# Patient Record
Sex: Female | Born: 1980 | Race: White | Hispanic: No | Marital: Married | Smoking: Never smoker
Health system: Southern US, Community
[De-identification: ages and names within clinical notes are randomized; demographics above are authoritative.]

## PROBLEM LIST (undated history)

## (undated) ENCOUNTER — Inpatient Hospital Stay (HOSPITAL_COMMUNITY): Payer: Self-pay

## (undated) DIAGNOSIS — N390 Urinary tract infection, site not specified: Secondary | ICD-10-CM

## (undated) DIAGNOSIS — F32A Depression, unspecified: Secondary | ICD-10-CM

## (undated) DIAGNOSIS — E7800 Pure hypercholesterolemia, unspecified: Secondary | ICD-10-CM

## (undated) DIAGNOSIS — Z8719 Personal history of other diseases of the digestive system: Secondary | ICD-10-CM

## (undated) DIAGNOSIS — J341 Cyst and mucocele of nose and nasal sinus: Secondary | ICD-10-CM

## (undated) DIAGNOSIS — Z8711 Personal history of peptic ulcer disease: Secondary | ICD-10-CM

## (undated) DIAGNOSIS — G43909 Migraine, unspecified, not intractable, without status migrainosus: Secondary | ICD-10-CM

## (undated) DIAGNOSIS — M1990 Unspecified osteoarthritis, unspecified site: Secondary | ICD-10-CM

## (undated) DIAGNOSIS — I10 Essential (primary) hypertension: Secondary | ICD-10-CM

## (undated) DIAGNOSIS — Z789 Other specified health status: Secondary | ICD-10-CM

## (undated) DIAGNOSIS — F419 Anxiety disorder, unspecified: Secondary | ICD-10-CM

## (undated) DIAGNOSIS — F329 Major depressive disorder, single episode, unspecified: Secondary | ICD-10-CM

## (undated) DIAGNOSIS — Z780 Asymptomatic menopausal state: Secondary | ICD-10-CM

## (undated) DIAGNOSIS — M2550 Pain in unspecified joint: Secondary | ICD-10-CM

## (undated) DIAGNOSIS — F988 Other specified behavioral and emotional disorders with onset usually occurring in childhood and adolescence: Secondary | ICD-10-CM

## (undated) DIAGNOSIS — N83209 Unspecified ovarian cyst, unspecified side: Secondary | ICD-10-CM

## (undated) DIAGNOSIS — M549 Dorsalgia, unspecified: Secondary | ICD-10-CM

## (undated) DIAGNOSIS — K5900 Constipation, unspecified: Secondary | ICD-10-CM

## (undated) DIAGNOSIS — R12 Heartburn: Secondary | ICD-10-CM

## (undated) DIAGNOSIS — E042 Nontoxic multinodular goiter: Secondary | ICD-10-CM

## (undated) DIAGNOSIS — R1310 Dysphagia, unspecified: Secondary | ICD-10-CM

## (undated) DIAGNOSIS — M25519 Pain in unspecified shoulder: Secondary | ICD-10-CM

## (undated) HISTORY — DX: Personal history of peptic ulcer disease: Z87.11

## (undated) HISTORY — DX: Heartburn: R12

## (undated) HISTORY — DX: Anxiety disorder, unspecified: F41.9

## (undated) HISTORY — DX: Other specified behavioral and emotional disorders with onset usually occurring in childhood and adolescence: F98.8

## (undated) HISTORY — DX: Migraine, unspecified, not intractable, without status migrainosus: G43.909

## (undated) HISTORY — DX: Dysphagia, unspecified: R13.10

## (undated) HISTORY — DX: Cyst and mucocele of nose and nasal sinus: J34.1

## (undated) HISTORY — DX: Depression, unspecified: F32.A

## (undated) HISTORY — PX: WISDOM TOOTH EXTRACTION: SHX21

## (undated) HISTORY — DX: Pain in unspecified joint: M25.50

## (undated) HISTORY — DX: Asymptomatic menopausal state: Z78.0

## (undated) HISTORY — DX: Nontoxic multinodular goiter: E04.2

## (undated) HISTORY — PX: TUBAL LIGATION: SHX77

## (undated) HISTORY — DX: Pain in unspecified shoulder: M25.519

## (undated) HISTORY — DX: Pure hypercholesterolemia, unspecified: E78.00

## (undated) HISTORY — PX: SHOULDER CAPSULORRHAPHY: SUR188

## (undated) HISTORY — DX: Constipation, unspecified: K59.00

## (undated) HISTORY — DX: Dorsalgia, unspecified: M54.9

## (undated) HISTORY — DX: Major depressive disorder, single episode, unspecified: F32.9

## (undated) HISTORY — PX: OTHER SURGICAL HISTORY: SHX169

---

## 2011-01-28 ENCOUNTER — Other Ambulatory Visit: Payer: Self-pay | Admitting: Chiropractic Medicine

## 2011-01-28 ENCOUNTER — Ambulatory Visit
Admission: RE | Admit: 2011-01-28 | Discharge: 2011-01-28 | Disposition: A | Discharge status: Home / Self Care | Payer: No Typology Code available for payment source | Source: Ambulatory Visit | Attending: Chiropractic Medicine | Admitting: Chiropractic Medicine

## 2011-01-28 DIAGNOSIS — M25571 Pain in right ankle and joints of right foot: Secondary | ICD-10-CM

## 2011-01-28 IMAGING — CR DG ANKLE COMPLETE 3+V*R*
3 series · 3 of 3 positions shown · non-contrast
Comparison: None

CLINICAL DATA: 1 week status post fall.  Inversion injury.
Question fracture.  Lateral pain.

RIGHT ANKLE - COMPLETE 3+ VIEW

[t ankle joint ap right]
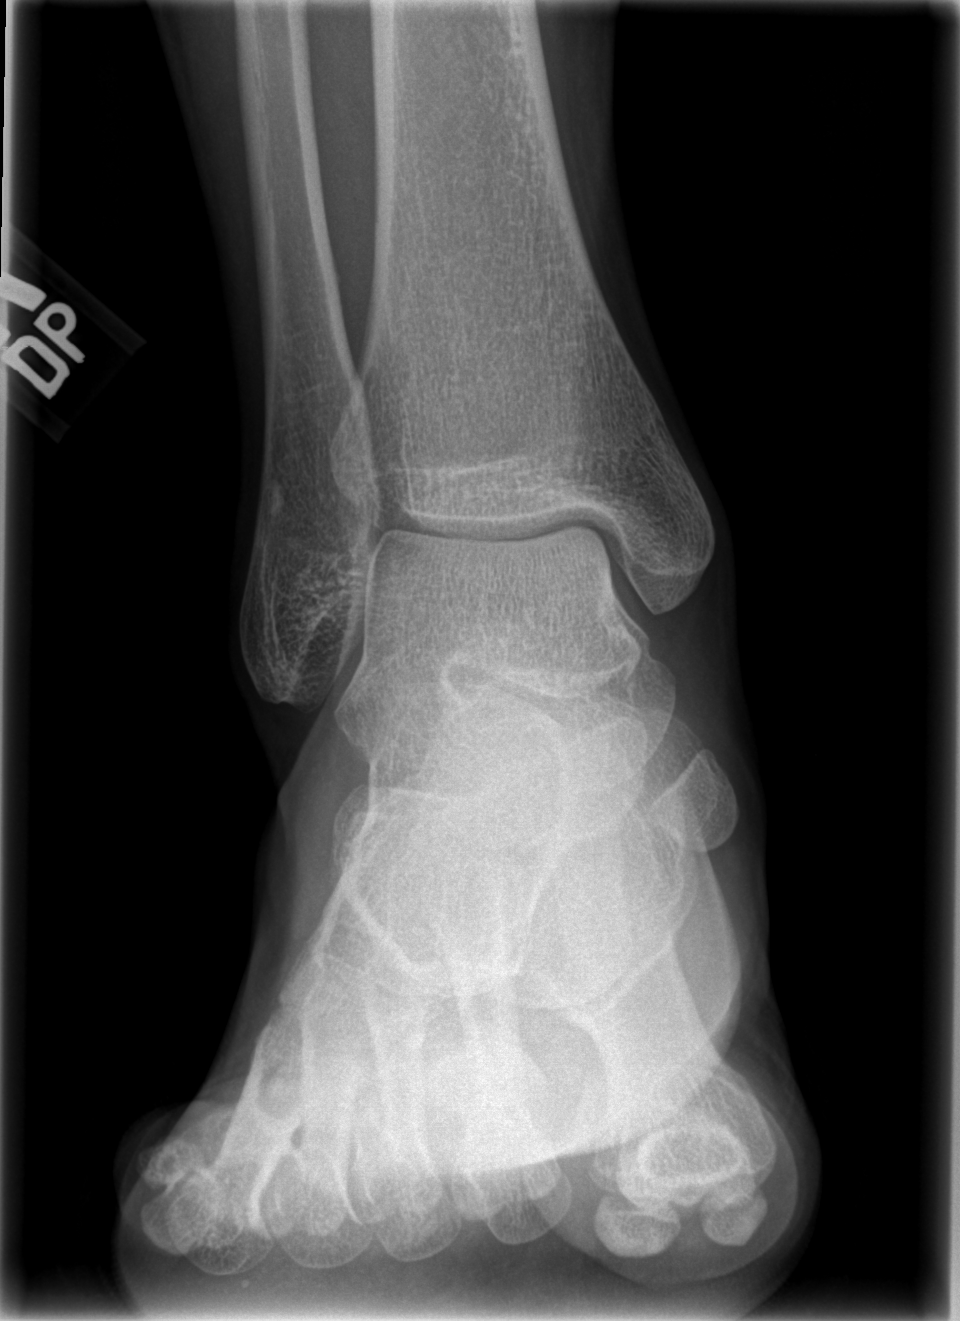

[t ankle joint oblique right]
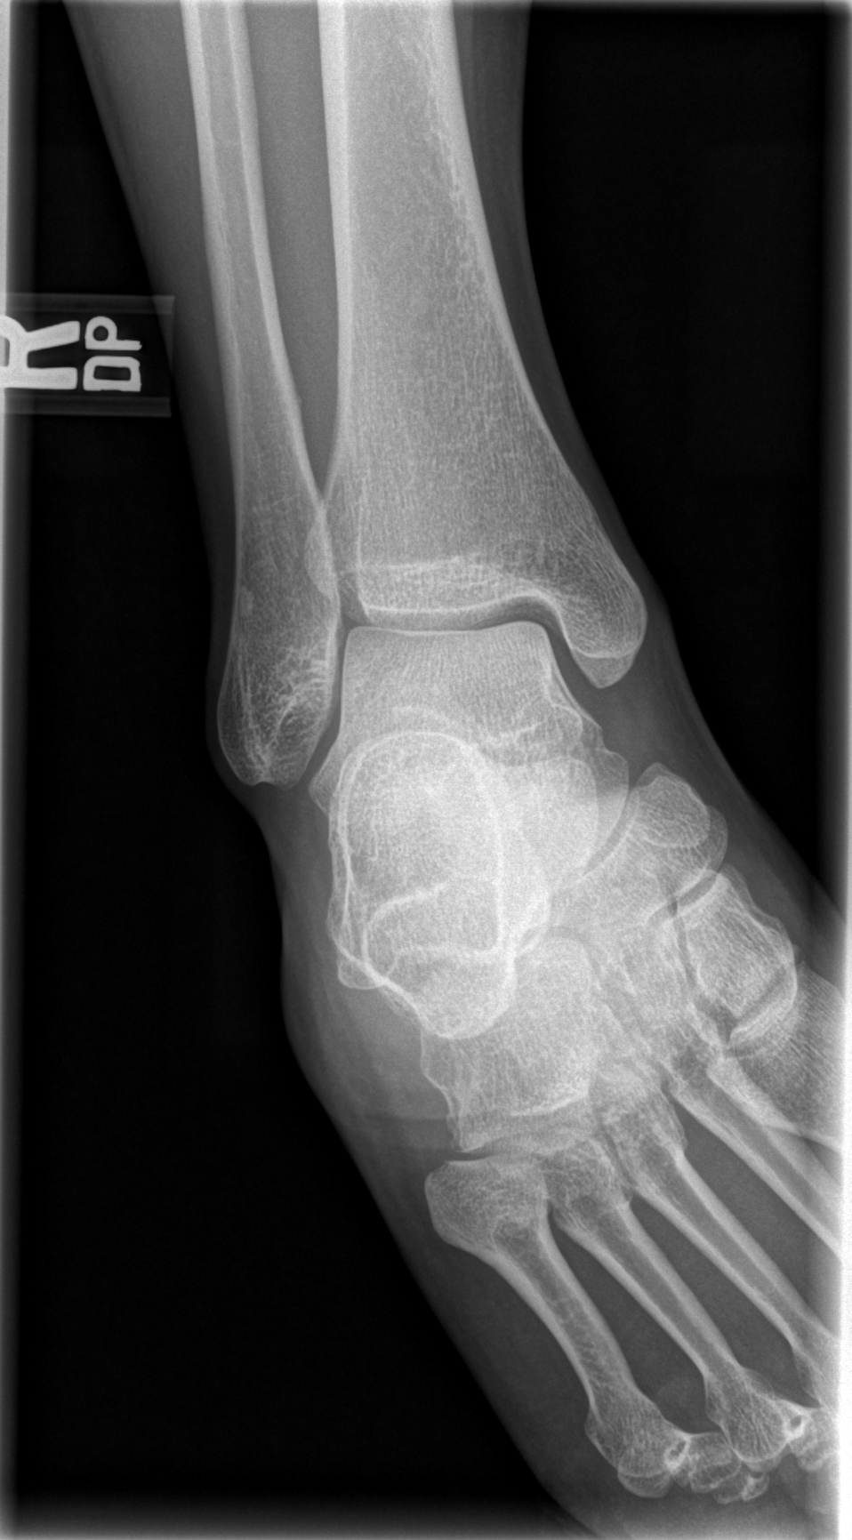

[t ankle joint lat right]
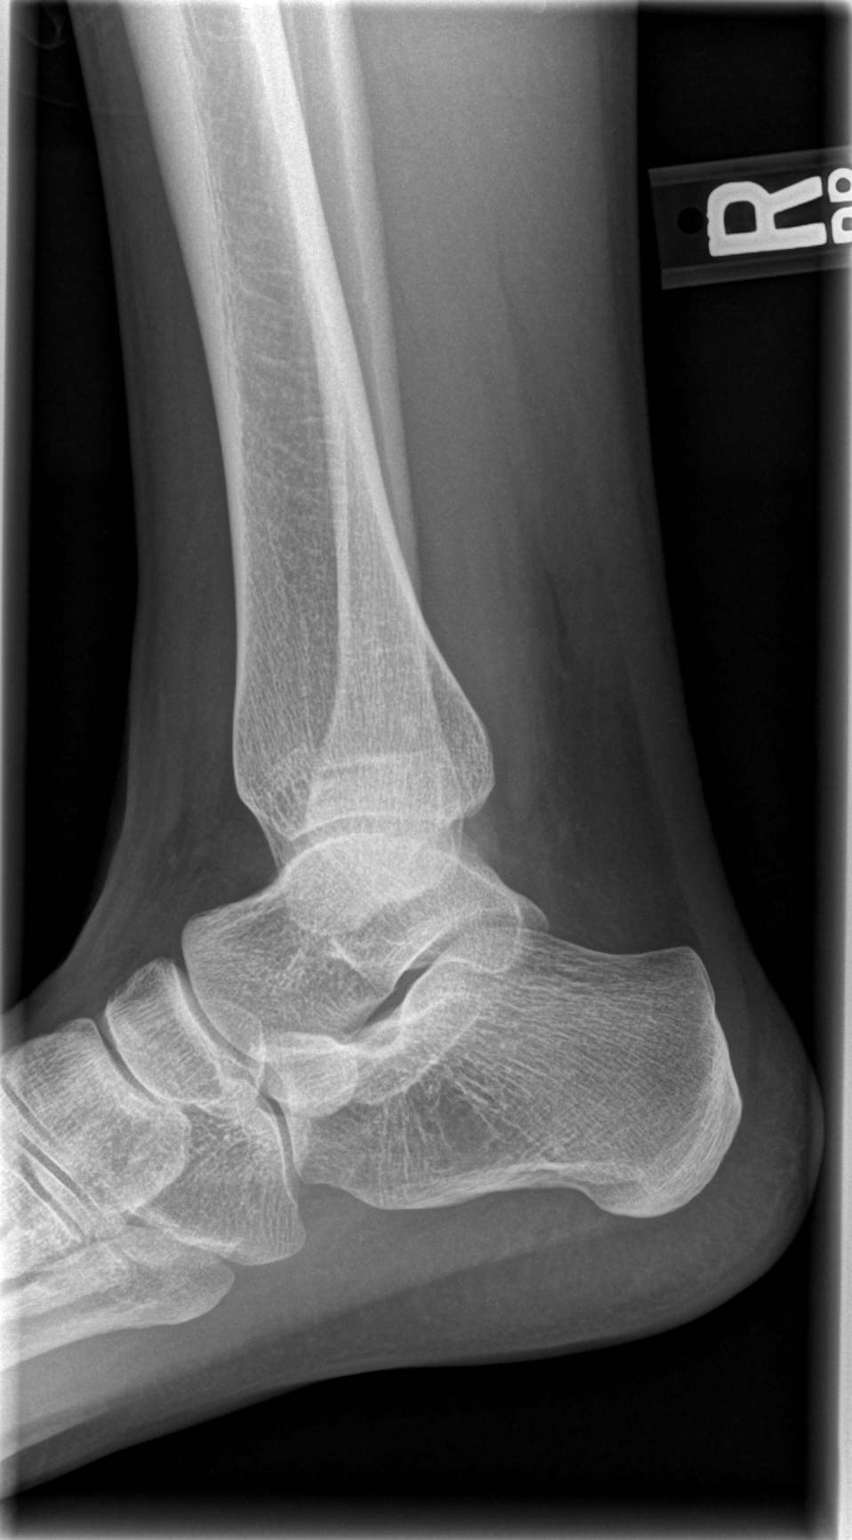

[3 of 3 positions shown; findings below may reference images not displayed]

FINDINGS: Suspect a tiny bone island in the fibula. No acute
fracture or dislocation.  The talar dome and base of fifth
metatarsal are intact. No significant soft tissue swelling.
IMPRESSION: No acute findings about the right ankle.

## 2011-06-26 ENCOUNTER — Inpatient Hospital Stay (HOSPITAL_COMMUNITY)
Admission: EM | Admit: 2011-06-26 | Discharge: 2011-06-29 | DRG: 777 | Disposition: A | Discharge status: Home / Self Care | Payer: Medicaid Other | Attending: Obstetrics and Gynecology | Admitting: Obstetrics and Gynecology

## 2011-06-26 ENCOUNTER — Other Ambulatory Visit: Payer: Self-pay

## 2011-06-26 ENCOUNTER — Emergency Department (HOSPITAL_COMMUNITY): Payer: Medicaid Other

## 2011-06-26 DIAGNOSIS — R112 Nausea with vomiting, unspecified: Secondary | ICD-10-CM | POA: Diagnosis present

## 2011-06-26 DIAGNOSIS — O00109 Unspecified tubal pregnancy without intrauterine pregnancy: Principal | ICD-10-CM | POA: Diagnosis present

## 2011-06-26 LAB — URINALYSIS, ROUTINE W REFLEX MICROSCOPIC
Leukocytes, UA: NEGATIVE
Nitrite: NEGATIVE
Protein, ur: NEGATIVE mg/dL
Urobilinogen, UA: 0.2 mg/dL (ref 0.0–1.0)

## 2011-06-26 LAB — PROTIME-INR: Prothrombin Time: 14.3 seconds (ref 11.6–15.2)

## 2011-06-26 LAB — DIFFERENTIAL
Basophils Absolute: 0 10*3/uL (ref 0.0–0.1)
Eosinophils Absolute: 0.1 10*3/uL (ref 0.0–0.7)
Eosinophils Relative: 1 % (ref 0–5)
Lymphocytes Relative: 9 % — ABNORMAL LOW (ref 12–46)
Lymphs Abs: 3.2 10*3/uL (ref 0.7–4.0)
Monocytes Absolute: 0.5 10*3/uL (ref 0.1–1.0)
Monocytes Relative: 4 % (ref 3–12)
Neutro Abs: 13.5 10*3/uL — ABNORMAL HIGH (ref 1.7–7.7)

## 2011-06-26 LAB — CBC
HCT: 18.2 % — ABNORMAL LOW (ref 36.0–46.0)
Hemoglobin: 6.4 g/dL — CL (ref 12.0–15.0)
MCH: 29.5 pg (ref 26.0–34.0)
MCHC: 35.2 g/dL (ref 30.0–36.0)
MCV: 86.2 fL (ref 78.0–100.0)
Platelets: 258 10*3/uL (ref 150–400)
RBC: 4.2 MIL/uL (ref 3.87–5.11)
RDW: 12.5 % (ref 11.5–15.5)
WBC: 15.5 10*3/uL — ABNORMAL HIGH (ref 4.0–10.5)

## 2011-06-26 LAB — BASIC METABOLIC PANEL
Calcium: 9.3 mg/dL (ref 8.4–10.5)
GFR calc Af Amer: >60 mL/min (ref >60)
GFR calc non Af Amer: >60 mL/min (ref >60)
Potassium: 3.4 mEq/L — ABNORMAL LOW (ref 3.5–5.1)
Sodium: 136 mEq/L (ref 135–145)

## 2011-06-26 LAB — HCG, QUANTITATIVE, PREGNANCY: hCG, Beta Chain, Quant, S: 18129 m[IU]/mL — ABNORMAL HIGH (ref <5)

## 2011-06-26 LAB — APTT: aPTT: 27 seconds (ref 24–37)

## 2011-06-26 LAB — WET PREP, GENITAL

## 2011-06-26 IMAGING — US US OB TRANSVAGINAL
1 series · 13 of 28 positions shown · non-contrast
Comparison: None.

CLINICAL DATA: Diffuse abdominal and pelvic pain.  6-[P3]-day
gestational age by LMP.

OBSTETRIC <14 WK US AND TRANSVAGINAL OB US
TECHNIQUE: Both transabdominal and transvaginal ultrasound
examinations were performed for complete evaluation of the
gestation as well as the maternal uterus, adnexal regions, and
pelvic cul-de-sac.  Transvaginal technique was performed to assess
early pregnancy.

[Series 1: us ob transvaginal · 0.32mm/px · 13 of 41 slices shown]
[im 2/41]
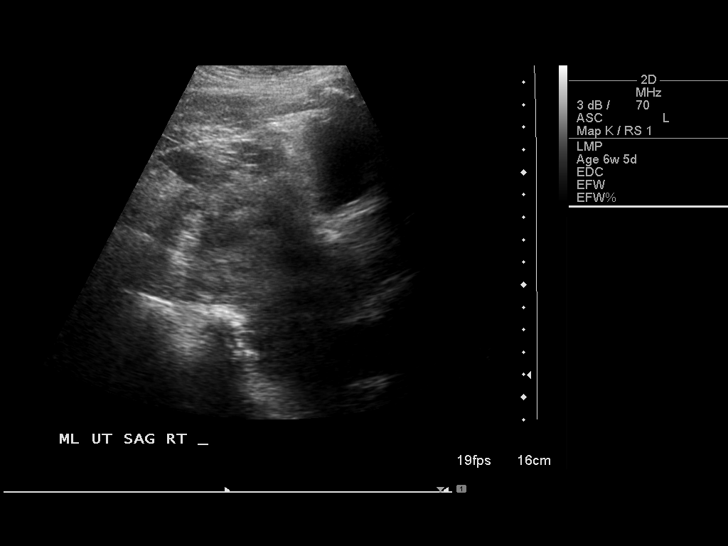
[im 5/41]
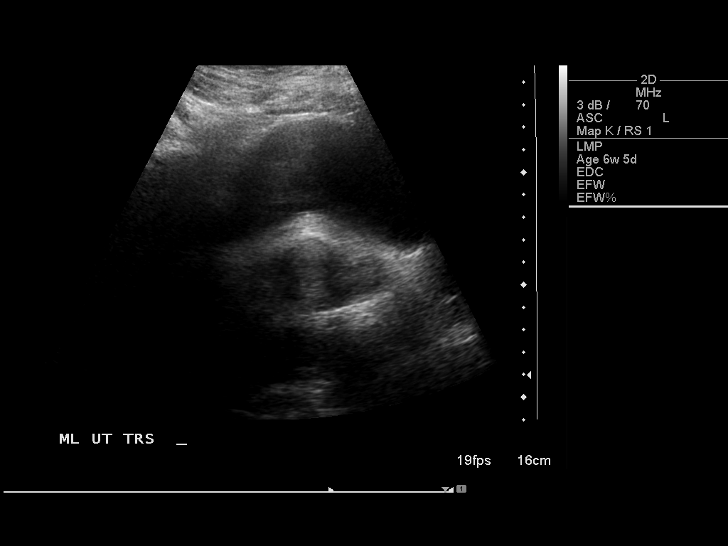
[im 8/41]
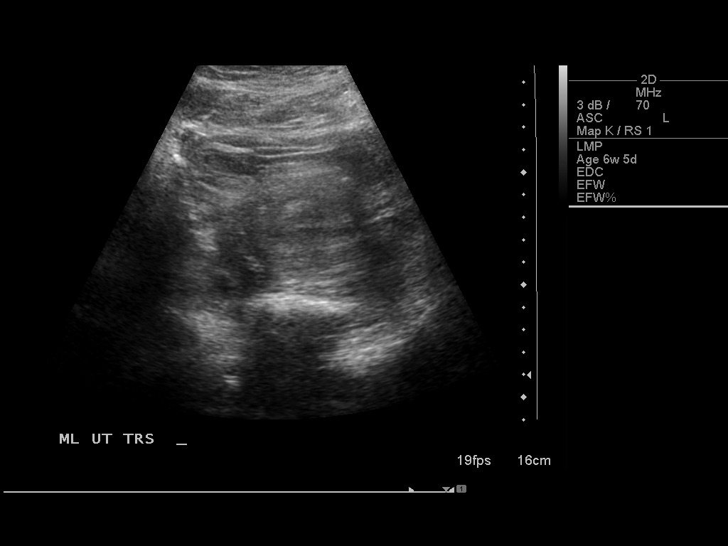
[im 11/41]
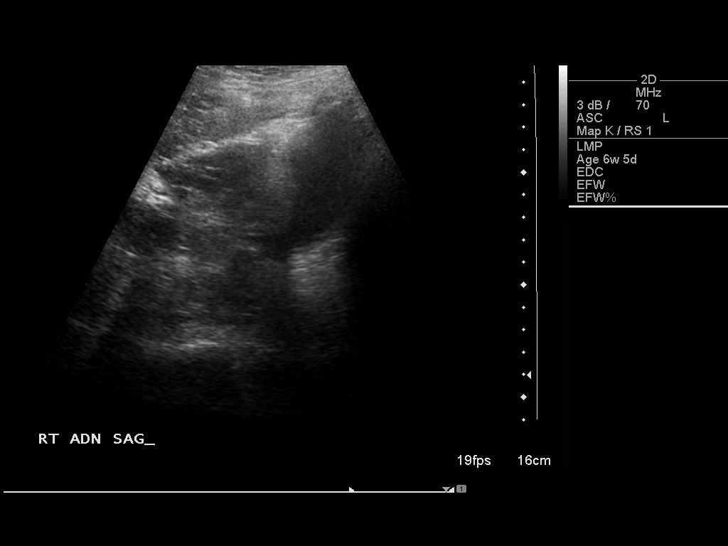
[im 14/41]
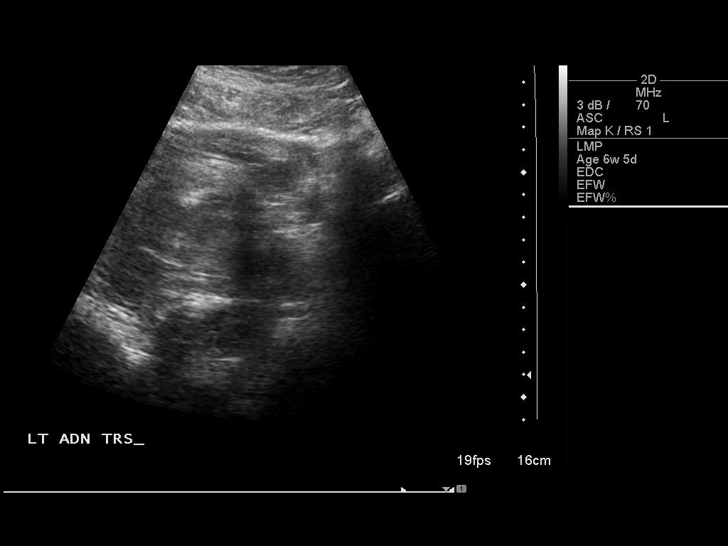
[im 17/41]
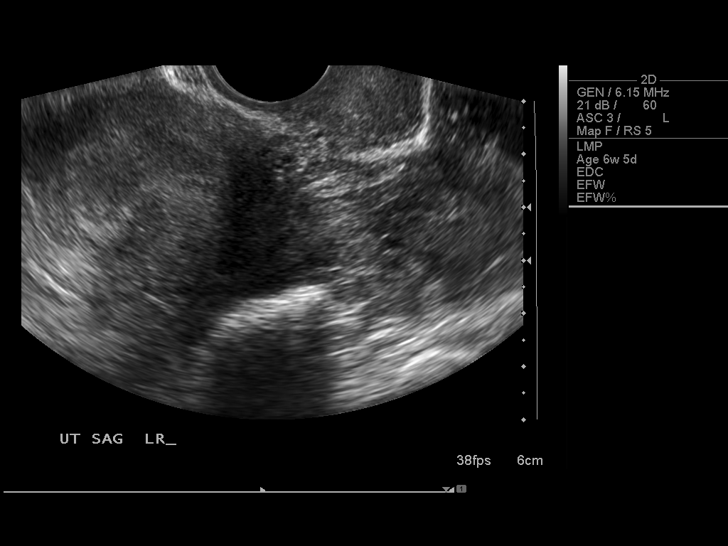
[im 21/41]
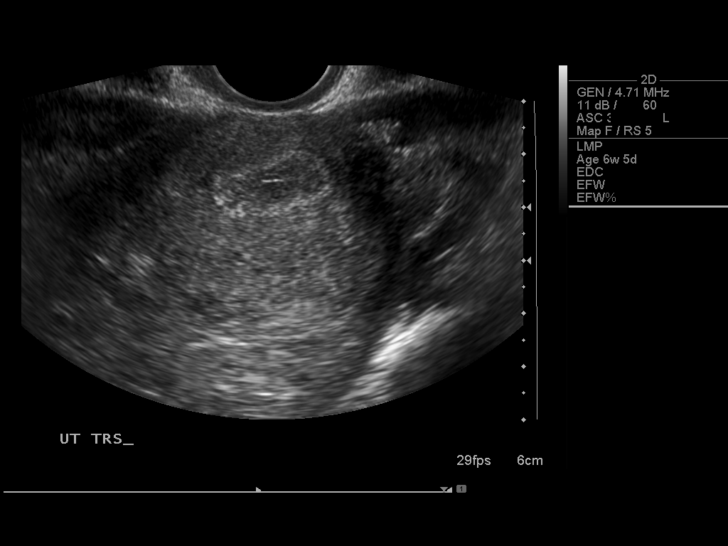
[im 24/41]
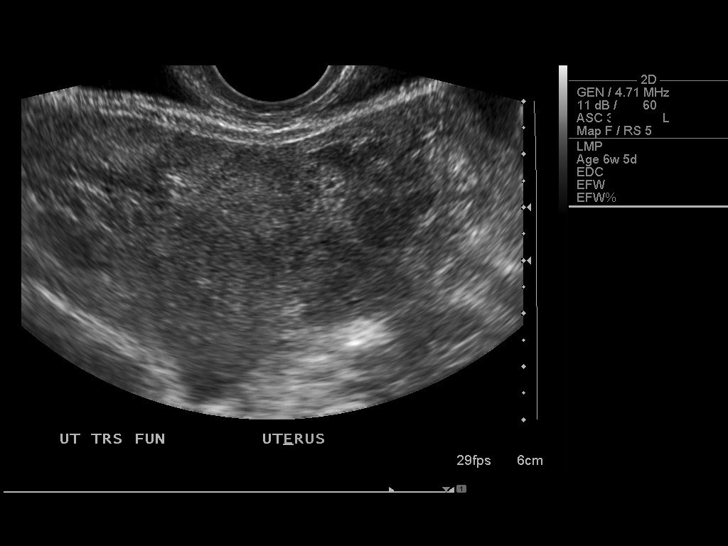
[im 27/41]
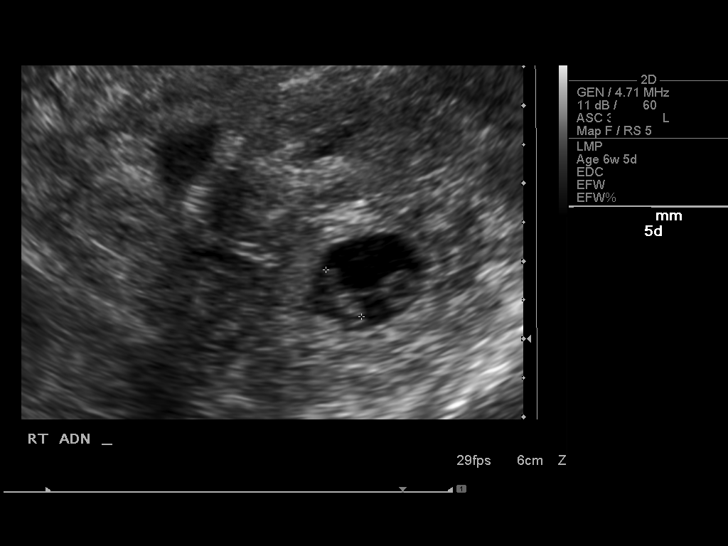
[im 30/41]
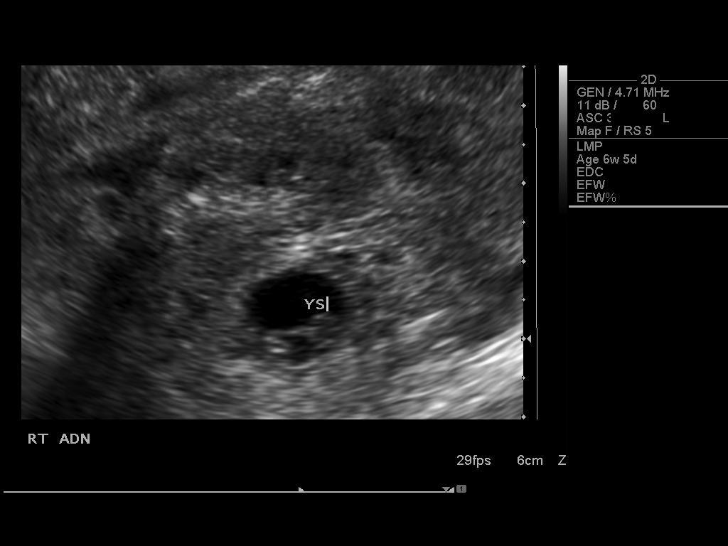
[im 33/41]
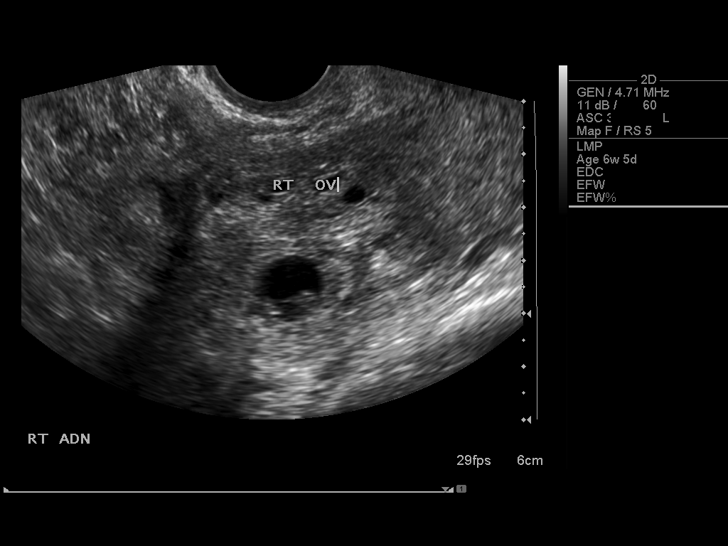
[im 36/41]
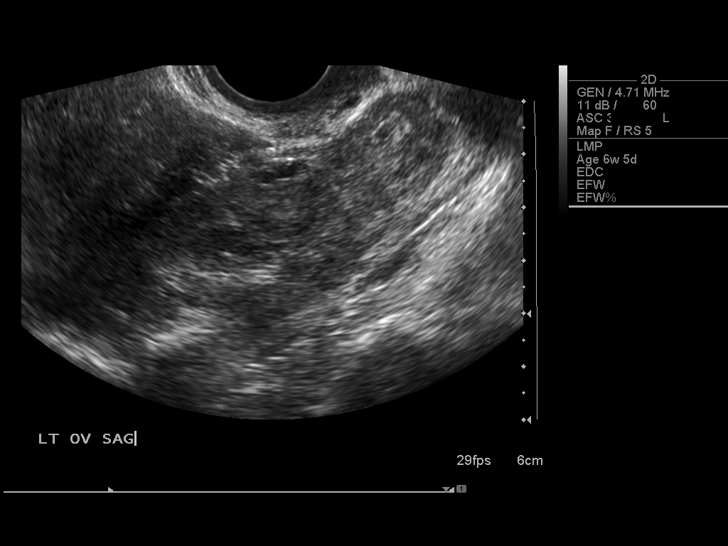
[im 39/41]
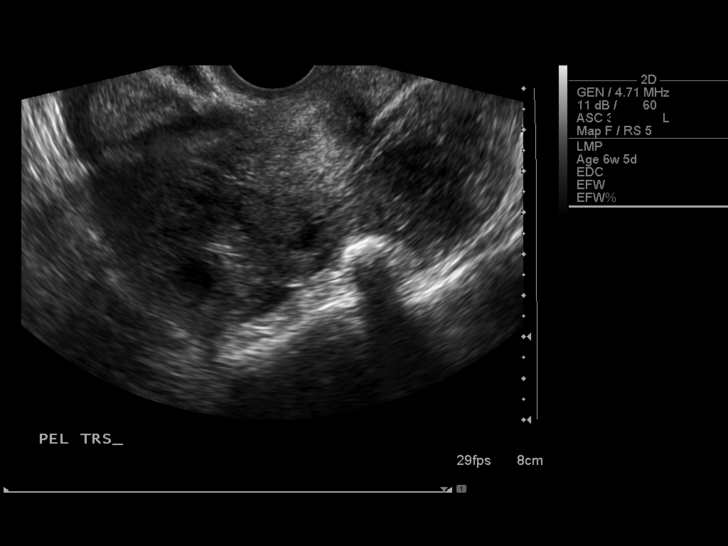

[13 of 28 positions shown; findings below may reference images not displayed]

FINDINGS: There is no evidence of an intrauterine gestational sac.
No fibroids or other uterine masses are identified.

The left ovary is normal in appearance.  The right ovary also
appears normal.

In the right adnexa adjacent to the ovary, a living ectopic
gestation is seen with embryonic cardiac activity.  The embryonic
crown-rump length measures 8 mm, corresponding to gestational age
of 6 weeks 5 days.  A moderate amount of complex fluid or blood is
seen within the pelvic cul-de-sac.
IMPRESSION: Living ectopic pregnancy in the right adnexa measuring 6 weeks 5
days, with moderate hemoperitoneum.

Critical Value/emergent results were called by telephone at the
time of interpretation on [DATE] the  at [P3] hours  to  BOES
BOES PA in the emergency department, who verbally acknowledged
these results.

## 2011-06-27 LAB — CBC
HCT: 32 % — ABNORMAL LOW (ref 36.0–46.0)
HCT: 31.1 % — ABNORMAL LOW (ref 36.0–46.0)
Hemoglobin: 10.8 g/dL — ABNORMAL LOW (ref 12.0–15.0)
Hemoglobin: 11.1 g/dL — ABNORMAL LOW (ref 12.0–15.0)
Hemoglobin: 12.8 g/dL (ref 12.0–15.0)
MCH: 30.4 pg (ref 26.0–34.0)
MCH: 30.5 pg (ref 26.0–34.0)
MCH: 31.2 pg (ref 26.0–34.0)
MCHC: 34.7 g/dL (ref 30.0–36.0)
MCHC: 34.7 g/dL (ref 30.0–36.0)
MCV: 87.3 fL (ref 78.0–100.0)
RBC: 4.1 MIL/uL (ref 3.87–5.11)

## 2011-06-28 LAB — CBC
MCH: 29.9 pg (ref 26.0–34.0)
MCV: 89.7 fL (ref 78.0–100.0)
Platelets: 159 10*3/uL (ref 150–400)
RBC: 3.41 MIL/uL — ABNORMAL LOW (ref 3.87–5.11)
RDW: 13.6 % (ref 11.5–15.5)

## 2011-06-30 LAB — CROSSMATCH: Unit division: 0

## 2011-09-18 NOTE — Discharge Summary (Signed)
  NAMELILLIONA, BLAKENEY                 ACCOUNT NO.:  [id number]  MEDICAL RECORD NO.:  [id number]  LOCATION:  1317                         FACILITY:  [facility]  PHYSICIAN:  Juluis Mire, M.D.   DATE OF BIRTH:  December 25, 1980  DATE OF ADMISSION:  06/26/2011 DATE OF DISCHARGE:  06/29/2011                              DISCHARGE SUMMARY   ADMITTING DIAGNOSIS:  Ruptured right ectopic pregnancy.  DISCHARGE DIAGNOSIS:  Ruptured right ectopic pregnancy.  PROCEDURE:  Exploratory laparotomy with right salpingo-oophorectomy. For complete history and physical, please see dictated note.  COURSE IN THE HOSPITAL:  The patient was brought in and underwent exploratory laparotomy with removal of right tube, consistent with ectopic pregnancy.  Did receive 2 units of blood.  Postoperatively, did fairly well.  After transfusion postop, hemoglobin was 11.1.  She was discharged home on her 2nd postoperative day.  At that time, she was afebrile with stable vital signs.  Abdomen was soft, minimally tender. Bowel sounds were active.  She was passing flatus and tolerating a regular diet.  Incision was clear.  Staples were in place.  She was having no active vaginal bleeding or sign of deep venous thrombosis. She was afebrile and vital signs were stable.  In terms of complications, none accounted during stay in the hospital. The patient was discharged home in stable condition.  DISPOSITION:  Routine postop instructions were given.  She is to avoid heavy lifting, vaginal entrance, and driving of car.  She is to call with signs of fever, nausea, vomiting, increasing abdominal pain, heavy vaginal bleeding.  Signs of deep venous thrombosis and pulmonary embolus were also discussed.  Discharged home on Dilaudid as needed for pain. She will be on iron sulfate supplementation.  She will follow up in my office on Tuesday for staple removal.     Juluis Mire, M.D.     JSM/MEDQ  D:  06/29/2011  T:  06/29/2011   Job:  [id number]  Electronically Signed by Richardean Chimera M.D. on 09/18/2011 12:22:16 PM

## 2011-09-21 NOTE — Op Note (Signed)
  NAMEMEGEN, MADEWELL                 ACCOUNT NO.:  [id number]  MEDICAL RECORD NO.:  [id number]  LOCATION:  1317                         FACILITY:  [facility]  PHYSICIAN:  Zelphia Cairo, MD    DATE OF BIRTH:  [birth year]  DATE OF PROCEDURE:  06/26/2011 DATE OF DISCHARGE:                              OPERATIVE REPORT   PREOPERATIVE DIAGNOSIS:  Ruptured ectopic pregnancy.  PROCEDURES: 1. Exploratory laparotomy. 2. Right salpingectomy.  SURGEON:  Zelphia Cairo, MD  ANESTHESIA:  General.  FINDINGS: 1. Approximately 1 L of blood and clot in the pelvis. 2. A dilated and bleeding right fallopian tube. 3. Normal-appearing left adnexa and uterus.  SPECIMEN:  Right fallopian tube with ectopic pregnancy to Pathology.  URINE OUTPUT:  300 cc, clear fluids.  Per Anesthesia with 3 units of packed red blood cells.  ESTIMATED BLOOD LOSS:  1 L.  COMPLICATIONS:  None.  CONDITION:  Stable to the recovery room.  PROCEDURE IN DETAIL:  The patient was found to be hemodynamically stable in the emergency room and taken to the operating room after informed consent was obtained with IV and packed red blood cells hanging.  She was given general anesthesia, prepped and draped in sterile fashion and a Foley catheter was inserted.  A Pfannenstiel skin incision was made with a scalpel and extended to the underlying fascia.  The fascia was incised in the midline extended laterally using curved Mayo scissors. Kocher clamps were used to grasp the superior and inferior portion of the fascia.  The underlying rectus muscles were dissected off using sharp and blunt dissection.  The peritoneum was then identified and entered bluntly.  Hemoperitoneum was noted.  The pull suction was used to remove a large amount of dark and bright red blood.  Clots were removed with my hand from the pelvis.  The bowel was then packed away an O'Connor-O'Sullivan retractor was placed in the pelvis.  The right fallopian tube  was grasped with a Babcock clamp and the ectopic pregnancy was identified.  A Heaney clamp was used to transect the fallopian tube.  The right fallopian tube was then excised using curved Mayo scissors and the pedicle was doubly ligated using 0-0 Vicryl.  Once hemostasis was assured, the pelvis was copiously irrigated with saline. Right adnexa was found to be hemostatic.  Left adnexa was visualized and appeared normal.  Irrigation was performed until all clots and debris were removed from the pelvis.  All lap sponges and retractors were then removed from the abdomen.  Peritoneum was closed with 0-Monocryl.  The fascia was closed with a looped 0-PDS and the skin was closed with staples.  Sponge lap, needle and instrument counts were correct x2.  She was taken to the recovery room where a repeat CBC will be drawn.  She received 3 units of packed red blood cells intraoperatively.     Zelphia Cairo, MD     GA/MEDQ  D:  06/26/2011  T:  06/27/2011  Job:  [id number]  Electronically Signed by Zelphia Cairo MD on 09/21/2011 08:22:25 AM

## 2011-10-23 NOTE — Discharge Summary (Signed)
  NAMELORECE, KEACH                 ACCOUNT NO.:  [id number]  MEDICAL RECORD NO.:  [id number]  LOCATION:  1317                         FACILITY:  [facility]  PHYSICIAN:  Zelphia Cairo, MD    DATE OF BIRTH:  1981-09-30  DATE OF ADMISSION:  06/26/2011 DATE OF DISCHARGE:  06/29/2011                              DISCHARGE SUMMARY   REASON FOR ADMISSION:  Ruptured ectopic pregnancy.  SURGICAL PROCEDURES:  Exploratory laparotomy with right salpingectomy.  HOSPITAL COURSE:  The patient was admitted to the hospital postoperatively where her pain was initially controlled with IV pain medication.  Foley catheter was inserted.  Postoperative day 1, her pain was moderately controlled with IV pain medication and Toradol and Motrin were added.  She was able to ambulate and her Foley catheter was discontinued.  Diet was advanced as tolerated.  Hemoglobin was stable on postop day 1 at 11.1.  Postoperative day #2, she was tolerating clear liquids and advanced to a regular diet.  She was ambulating without difficulty.  Her abdominal exam was stable, and her incision was clean, dry, and intact.  She had good bowel sounds.  As her diet was advanced, IV pain medication and fluids were discontinued, and she was advanced to a regular diet and p.o. pain medications.  Postoperative day #3, she was doing well.  She was passing flatus.  Her incision was clean and intact. She was felt to be stable for discharge home.  She was given prescription for p.o. Dilaudid and instructed to follow up in the office in 1-2 weeks for a recheck.     Zelphia Cairo, MD     GA/MEDQ  D:  09/21/2011  T:  09/21/2011  Job:  [id number]  Electronically Signed by Zelphia Cairo MD on 10/23/2011 07:40:33 AM

## 2012-01-04 ENCOUNTER — Inpatient Hospital Stay (HOSPITAL_COMMUNITY): Payer: Self-pay

## 2012-01-04 ENCOUNTER — Inpatient Hospital Stay (HOSPITAL_COMMUNITY)
Admission: AD | Admit: 2012-01-04 | Discharge: 2012-01-04 | Disposition: A | Discharge status: Home / Self Care | Payer: Self-pay | Source: Ambulatory Visit | Attending: Obstetrics and Gynecology | Admitting: Obstetrics and Gynecology

## 2012-01-04 DIAGNOSIS — R109 Unspecified abdominal pain: Secondary | ICD-10-CM | POA: Insufficient documentation

## 2012-01-04 DIAGNOSIS — O99891 Other specified diseases and conditions complicating pregnancy: Secondary | ICD-10-CM | POA: Insufficient documentation

## 2012-01-04 DIAGNOSIS — O26899 Other specified pregnancy related conditions, unspecified trimester: Secondary | ICD-10-CM

## 2012-01-04 LAB — CBC
HCT: 40.5 % (ref 36.0–46.0)
Hemoglobin: 13.6 g/dL (ref 12.0–15.0)
MCV: 88.6 fL (ref 78.0–100.0)
Platelets: 282 10*3/uL (ref 150–400)
RBC: 4.57 MIL/uL (ref 3.87–5.11)
WBC: 10.2 10*3/uL (ref 4.0–10.5)

## 2012-01-04 LAB — URINALYSIS, ROUTINE W REFLEX MICROSCOPIC
Bilirubin Urine: NEGATIVE
Glucose, UA: NEGATIVE mg/dL
Hgb urine dipstick: NEGATIVE
Ketones, ur: NEGATIVE mg/dL
Leukocytes, UA: NEGATIVE
Nitrite: NEGATIVE
Protein, ur: NEGATIVE mg/dL
Specific Gravity, Urine: 1.015 (ref 1.005–1.030)
Urobilinogen, UA: 0.2 mg/dL (ref 0.0–1.0)
pH: 5.5 (ref 5.0–8.0)

## 2012-01-04 LAB — DIFFERENTIAL
Eosinophils Relative: 2 % (ref 0–5)
Lymphocytes Relative: 37 % (ref 12–46)
Lymphs Abs: 3.8 10*3/uL (ref 0.7–4.0)

## 2012-01-04 LAB — POCT PREGNANCY, URINE: Preg Test, Ur: POSITIVE — AB

## 2012-01-04 LAB — HCG, QUANTITATIVE, PREGNANCY: hCG, Beta Chain, Quant, S: 1090 m[IU]/mL — ABNORMAL HIGH (ref <5)

## 2012-01-04 LAB — WET PREP, GENITAL: Trich, Wet Prep: NONE SEEN

## 2012-01-04 IMAGING — US US OB TRANSVAGINAL
1 series · 14 of 28 positions shown · non-contrast
Comparison: None.

CLINICAL DATA: Pregnant with pelvic pain and cramping.

OBSTETRIC <14 WK US AND TRANSVAGINAL OB US
TECHNIQUE: Both transabdominal and transvaginal ultrasound
examinations were performed for complete evaluation of the
gestation as well as the maternal uterus, adnexal regions, and
pelvic cul-de-sac.  Transvaginal technique was performed to assess
early pregnancy.

[Series 1: us ob comp less 14 wks · 39 acquisitions, 14 frames shown]
[im 2/39]
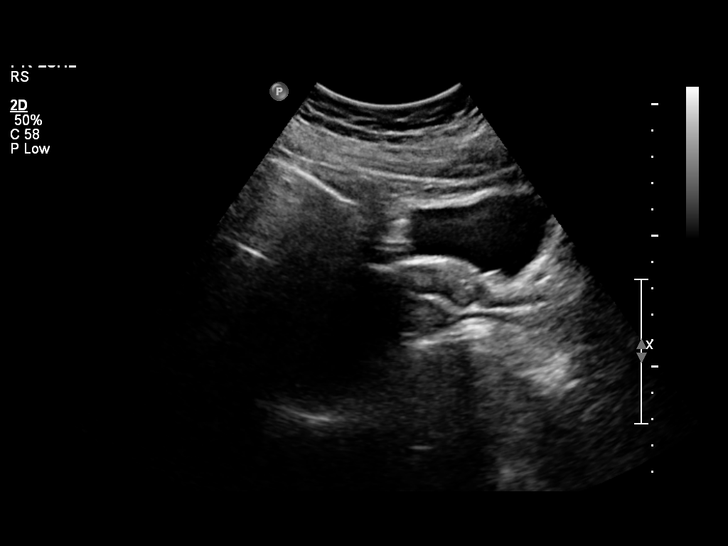
[im 5/39]
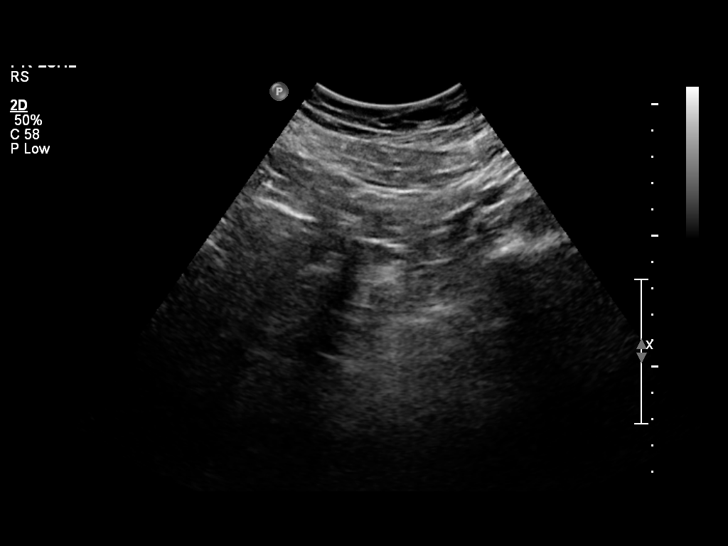
[im 8/39]
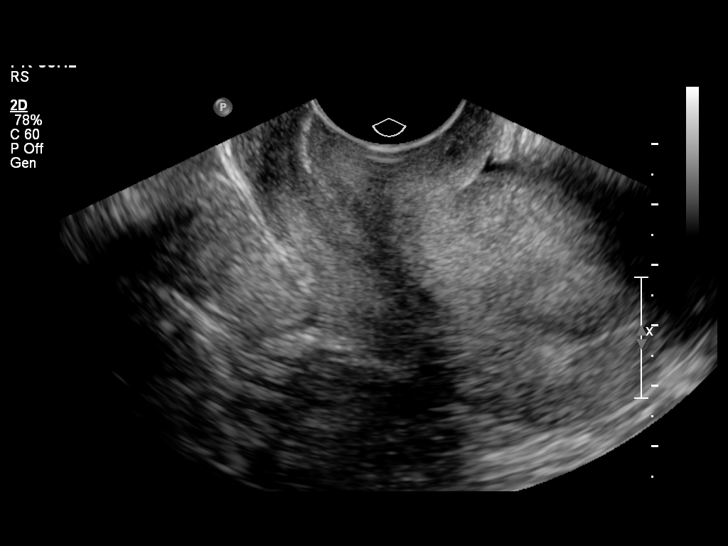
[im 10/39]
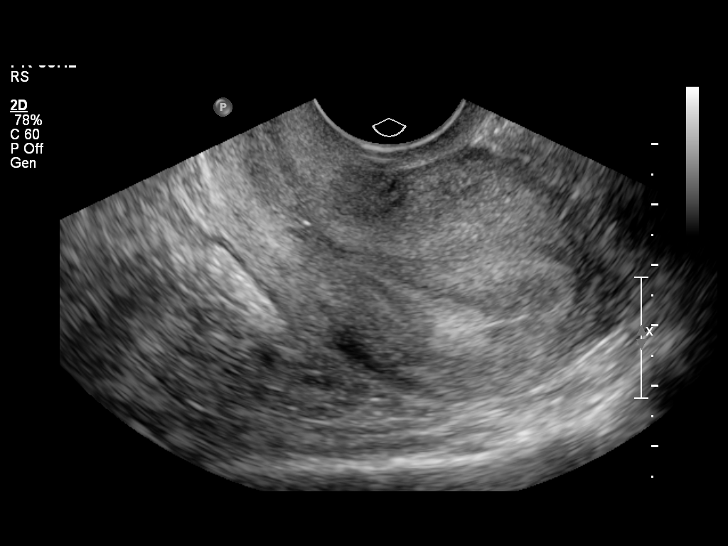
[im 13/39]
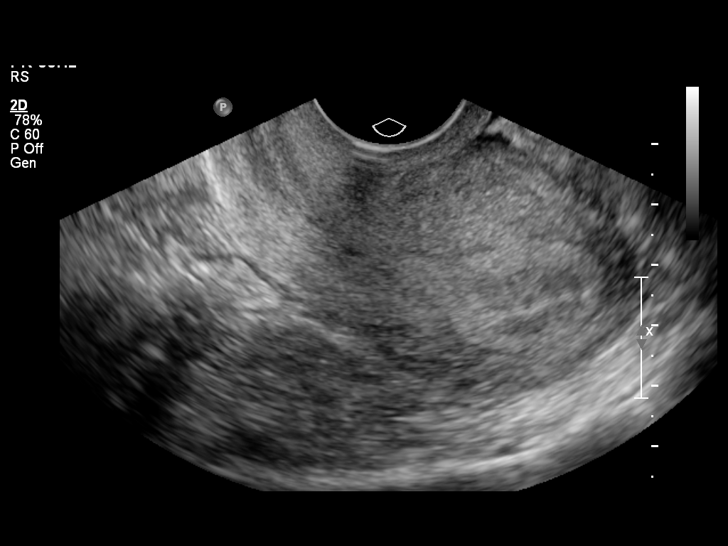
[im 16/39]
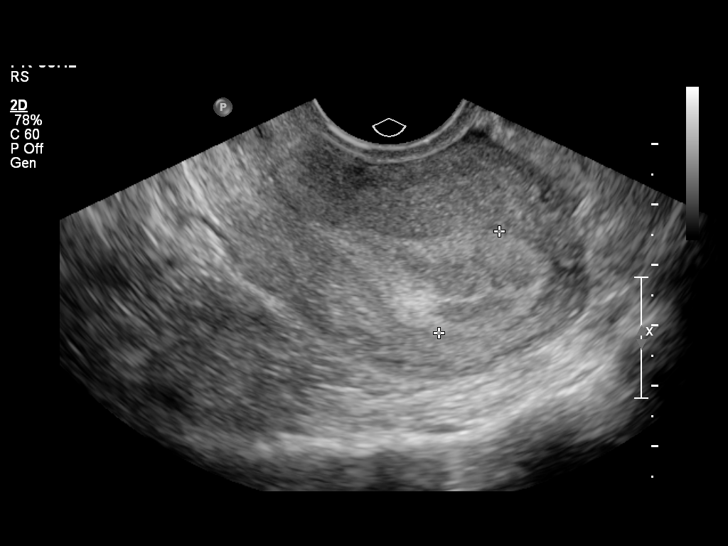
[im 19/39]
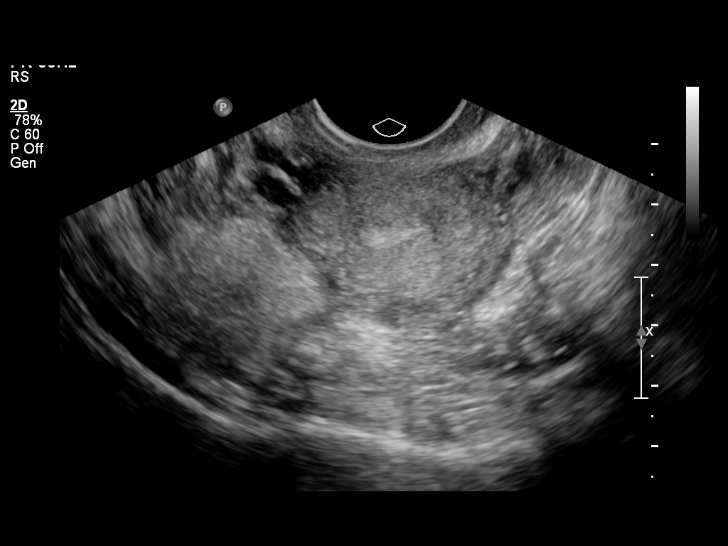
[im 22/39]
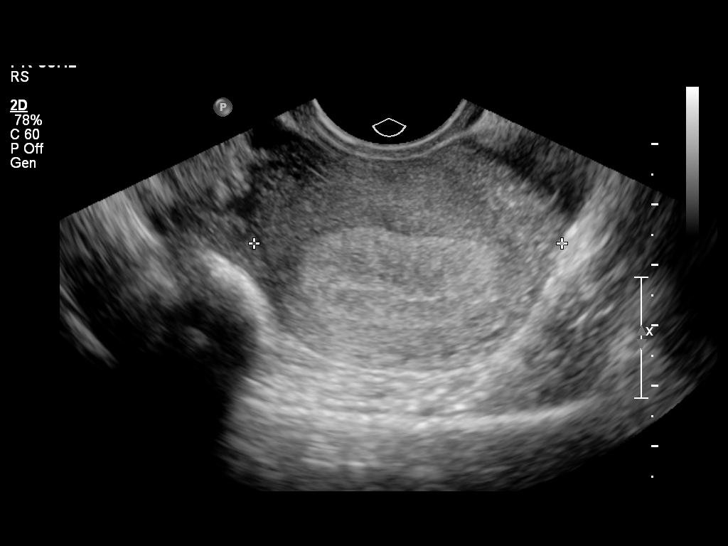
[im 24/39]
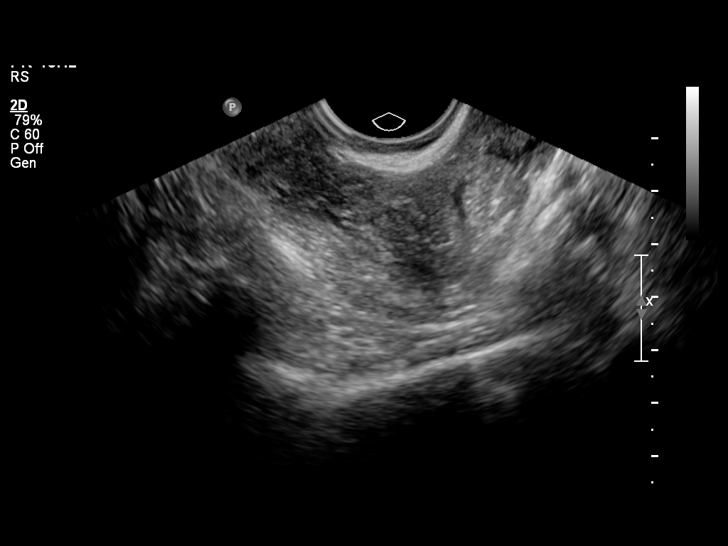
[im 27/39]
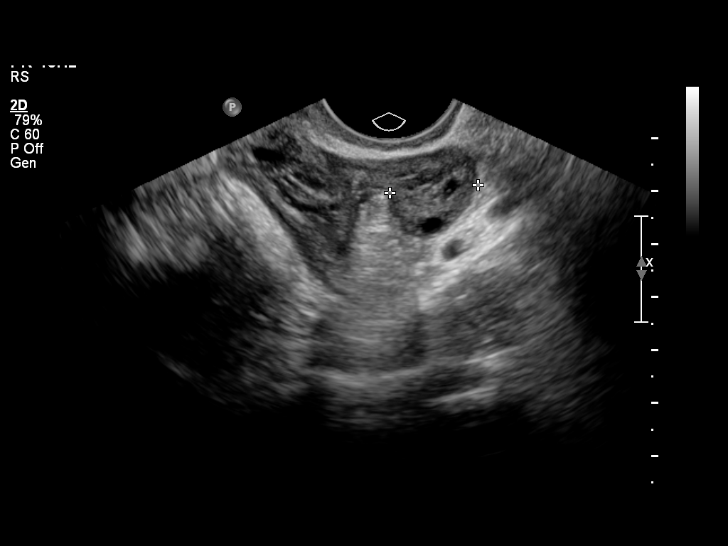
[im 30/39]
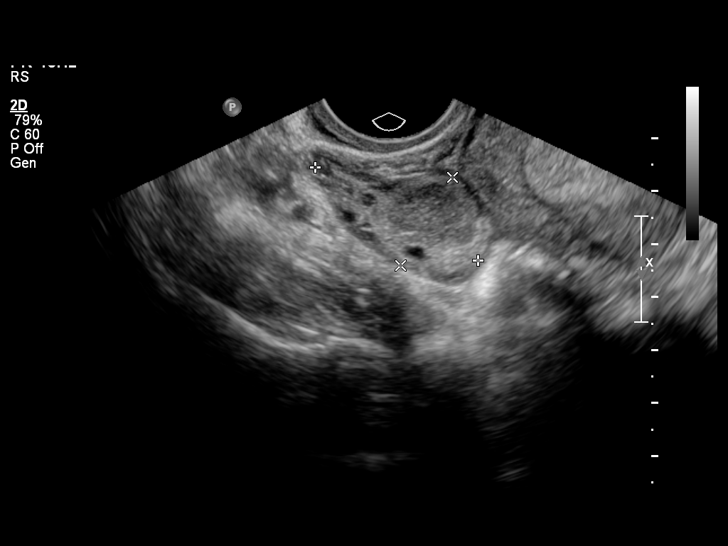
[im 33/39]
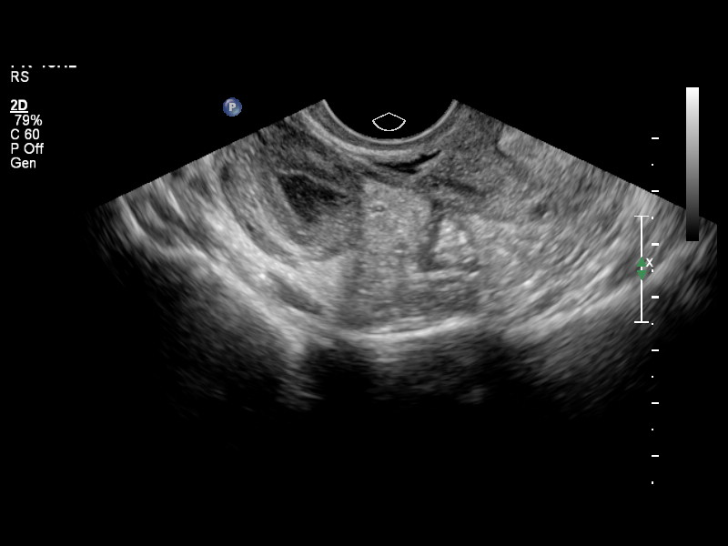
[im 36/39]
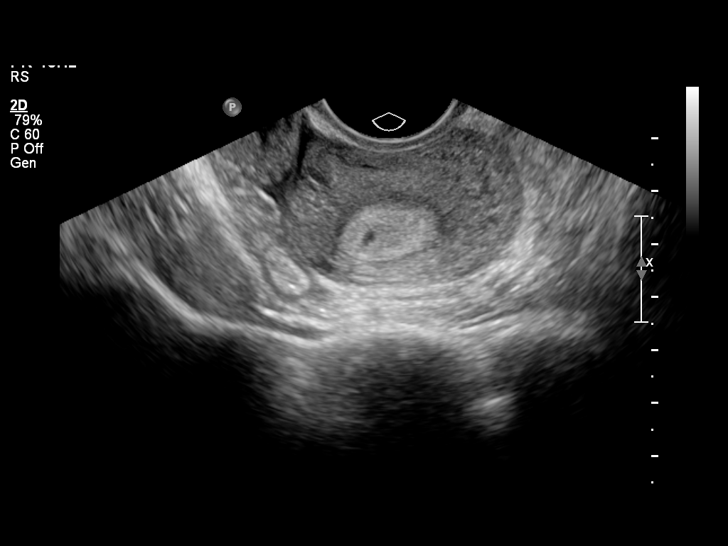
[im 39/39]
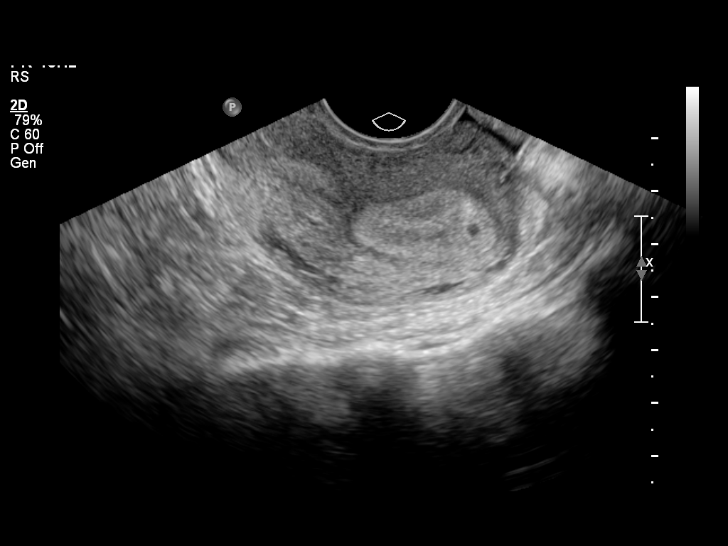

[14 of 28 positions shown; findings below may reference images not displayed]

Intrauterine gestational sac:  None
Yolk sac: N/A
Embryo: N/A
Cardiac Activity: N/A
Heart Rate: N/A bpm

Maternal uterus/adnexae:
The endometrium is thickened and a tiny endometrial fluid
collection is noted but no discernible gestational sac.

No subchorionic hemorrhage.
Normal right ovary.  Corpus luteum cyst noted.
Normal left ovary.
No free pelvic fluid collections.
IMPRESSION: [
1.  No intrauterine gestational sac.
2.  Mild endometrial thickening.
3.  Normal ovaries.  Corpus luteum cyst on the right.

## 2012-01-04 NOTE — Progress Notes (Signed)
Pt reports having cramping  On and off past 6-7 days. Took HPT and it was positive. Pt has had ectopic pregnancy back in August 2012 and  Want to make sure that this one is not an ectopic pregnancy.

## 2012-01-04 NOTE — ED Provider Notes (Signed)
History     CSN: [id number]  Arrival date & time 01/04/12  1713   None     No chief complaint on file.  HPI Debbie [name] is a 31 y.o. female who presents to [facility] for concerns for possible ectopic pregnancy. She had a positive home pregnancy test and has a history of ectopic pregnancy in August, 2012 that required surgery for removal of right ectopic and right tube. Patient reports cramping in lower abdomen for past 6 or 7 days.   No past medical history on file.  No past surgical history on file.  No family history on file.  History  Substance Use Topics  . Smoking status: Not on file  . Smokeless tobacco: Not on file  . Alcohol Use: Not on file    OB History    No data available      Review of Systems  Constitutional: Negative for fever, chills, diaphoresis and fatigue.  HENT: Negative for ear pain, congestion, sore throat, facial swelling, neck pain, neck stiffness, dental problem and sinus pressure.   Eyes: Negative for photophobia, pain and discharge.  Respiratory: Negative for cough, chest tightness and wheezing.   Gastrointestinal: Positive for nausea and abdominal pain. Negative for vomiting, diarrhea, constipation and abdominal distention.  Genitourinary: Positive for pelvic pain. Negative for dysuria, frequency, flank pain, vaginal bleeding, vaginal discharge and difficulty urinating.  Musculoskeletal: Negative for myalgias, back pain and gait problem.  Skin: Negative for color change and rash.  Neurological: Negative for dizziness, speech difficulty, weakness, light-headedness, numbness and headaches.  Psychiatric/Behavioral: Negative for confusion and agitation.    Allergies  Review of patient's allergies indicates not on file.  Home Medications  No current outpatient prescriptions on file.  BP 109/71  Pulse 69  Temp(Src) 98 F (36.7 C) (Oral)  Resp 18  Ht 5\' 4"  (1.626 m)  Wt 167 lb 12.8 oz (76.114 kg)  BMI 28.80 kg/m2  LMP 12/07/2011  Physical Exam   Nursing note and vitals reviewed. Constitutional: She is oriented to person, place, and time. She appears well-developed and well-nourished.  HENT:  Head: Normocephalic.  Eyes: EOM are normal.  Neck: Neck supple.  Cardiovascular: Normal rate.   Pulmonary/Chest: Effort normal.  Abdominal: Soft. There is no tenderness.  Genitourinary:       External genitalia without lesions. White discharge vaginal vault. Cervix long, closed, no CMT. Minimal tenderness bilateral adnexa. Uterus without palpable enlargement.  Musculoskeletal: Normal range of motion.  Neurological: She is alert and oriented to person, place, and time. No cranial nerve deficit.  Skin: Skin is warm and dry.  Psychiatric: She has a normal mood and affect. Her behavior is normal. Judgment and thought content normal.   Results for orders placed during the hospital encounter of 01/04/12 (from the past 24 hour(s))  URINALYSIS, ROUTINE W REFLEX MICROSCOPIC     Status: Normal   Collection Time   01/04/12  5:42 PM      Component Value Range   Color, Urine YELLOW  YELLOW    APPearance CLEAR  CLEAR    Specific Gravity, Urine 1.015  1.005 - 1.030    pH 5.5  5.0 - 8.0    Glucose, UA NEGATIVE  NEGATIVE (mg/dL)   Hgb urine dipstick NEGATIVE  NEGATIVE    Bilirubin Urine NEGATIVE  NEGATIVE    Ketones, ur NEGATIVE  NEGATIVE (mg/dL)   Protein, ur NEGATIVE  NEGATIVE (mg/dL)   Urobilinogen, UA 0.2  0.0 - 1.0 (mg/dL)   Nitrite NEGATIVE  NEGATIVE    Leukocytes, UA NEGATIVE  NEGATIVE   POCT PREGNANCY, URINE     Status: Abnormal   Collection Time   01/04/12  5:48 PM      Component Value Range   Preg Test, Ur POSITIVE (*) NEGATIVE   CBC     Status: Normal   Collection Time   01/04/12  6:15 PM      Component Value Range   WBC 10.2  4.0 - 10.5 (K/uL)   RBC 4.57  3.87 - 5.11 (MIL/uL)   Hemoglobin 13.6  12.0 - 15.0 (g/dL)   HCT 08.6  57.8 - 46.9 (%)   MCV 88.6  78.0 - 100.0 (fL)   MCH 29.8  26.0 - 34.0 (pg)   MCHC 33.6  30.0 - 36.0  (g/dL)   RDW 62.9  52.8 - 41.3 (%)   Platelets 282  150 - 400 (K/uL)  DIFFERENTIAL     Status: Normal   Collection Time   01/04/12  6:15 PM      Component Value Range   Neutrophils Relative 53  43 - 77 (%)   Neutro Abs 5.4  1.7 - 7.7 (K/uL)   Lymphocytes Relative 37  12 - 46 (%)   Lymphs Abs 3.8  0.7 - 4.0 (K/uL)   Monocytes Relative 8  3 - 12 (%)   Monocytes Absolute 0.8  0.1 - 1.0 (K/uL)   Eosinophils Relative 2  0 - 5 (%)   Eosinophils Absolute 0.2  0.0 - 0.7 (K/uL)   Basophils Relative 1  0 - 1 (%)   Basophils Absolute 0.1  0.0 - 0.1 (K/uL)  HCG, QUANTITATIVE, PREGNANCY     Status: Abnormal   Collection Time   01/04/12  6:15 PM      Component Value Range   hCG, Beta Chain, Quant, S 1090 (*) <5 (mIU/mL)  ABO/RH     Status: Normal   Collection Time   01/04/12  6:15 PM      Component Value Range   ABO/RH(D) A POS    WET PREP, GENITAL     Status: Abnormal   Collection Time   01/04/12  6:27 PM      Component Value Range   Yeast Wet Prep HPF POC NONE SEEN  NONE SEEN    Trich, Wet Prep NONE SEEN  NONE SEEN    Clue Cells Wet Prep HPF POC FEW (*) NONE SEEN    WBC, Wet Prep HPF POC FEW (*) NONE SEEN    US Ob Transvaginal  01/04/2012  *RADIOLOGY REPORT*  Clinical Data: Pregnant with pelvic pain and cramping.  OBSTETRIC <14 WK [place] AND TRANSVAGINAL OB US  Technique:  Both transabdominal and transvaginal ultrasound examinations were performed for complete evaluation of the gestation as well as the maternal uterus, adnexal regions, and pelvic cul-de-sac.  Transvaginal technique was performed to assess early pregnancy.  Comparison:  None.  Intrauterine gestational sac:  None Yolk sac: N/A Embryo: N/A Cardiac Activity: N/A Heart Rate: N/A bpm  Maternal uterus/adnexae: The endometrium is thickened and a tiny endometrial fluid collection is noted but no discernible gestational sac.  No subchorionic hemorrhage. Normal right ovary.  Corpus luteum cyst noted. Normal left ovary. No free pelvic fluid  collections.  IMPRESSION:  1.  No intrauterine gestational sac. 2.  Mild endometrial thickening. 3.  Normal ovaries.  Corpus luteum cyst on the right.  Original Report Authenticated By: P. Loralie Champagne, M.D.   Assessment: First trimester pregnancy  Plan:  Return in 48 hours for repeat Bhcg   Ectopic precautions.  ED Course  Procedures    MDM          Kerrie Buffalo, NP 01/04/12 [phone]

## 2012-01-06 ENCOUNTER — Inpatient Hospital Stay (HOSPITAL_COMMUNITY)
Admission: AD | Admit: 2012-01-06 | Discharge: 2012-01-06 | Disposition: A | Discharge status: Home / Self Care | Payer: Medicaid Other | Source: Ambulatory Visit | Attending: Family Medicine | Admitting: Family Medicine

## 2012-01-06 ENCOUNTER — Encounter (HOSPITAL_COMMUNITY): Payer: Self-pay | Admitting: *Deleted

## 2012-01-06 DIAGNOSIS — O26899 Other specified pregnancy related conditions, unspecified trimester: Secondary | ICD-10-CM

## 2012-01-06 DIAGNOSIS — O99891 Other specified diseases and conditions complicating pregnancy: Secondary | ICD-10-CM | POA: Insufficient documentation

## 2012-01-06 DIAGNOSIS — R109 Unspecified abdominal pain: Secondary | ICD-10-CM | POA: Insufficient documentation

## 2012-01-06 HISTORY — DX: Other specified health status: Z78.9

## 2012-01-06 NOTE — ED Provider Notes (Signed)
History     No chief complaint on file.  HPI Debbie [name] is 31 y.o. presents for repeat BHCG>  She was last seen for 2/10 for cramping X 1 week with + Home UPT.  Hx of right ectopic pregnancy with removal of right tube.  Concerned about possibility of another ectopic.       BHCG 1090, Blood type A+.  Ultrasound showed No IUP, CLC on the right.  She denies bleeding tonight.  Mild cramping no worse since last visit.     No past medical history on file.  No past surgical history on file.  No family history on file.  History  Substance Use Topics  . Smoking status: Not on file  . Smokeless tobacco: Not on file  . Alcohol Use: Not on file    Allergies:  Allergies  Allergen Reactions  . Tylenol (Acetaminophen) Anaphylaxis  . Aspirin Other (See Comments)    Unknown childhood rxn  . Benadryl (Altaryl) Itching  . Dilaudid (Hydromorphone Hcl) Itching    Prescriptions prior to admission  Medication Sig Dispense Refill  . calcium gluconate 500 MG tablet Take 500 mg by mouth daily.      . cholecalciferol (VITAMIN D) 1000 UNITS tablet Take 1,000 Units by mouth daily.      . folic acid (FOLVITE) 400 MCG tablet Take 400 mcg by mouth daily.      . Prenatal Vit-Fe Fumarate-FA (PRENATAL MULTIVITAMIN) TABS Take 1 tablet by mouth daily.        Review of Systems  Constitutional: Negative.   Gastrointestinal: Positive for abdominal pain (mild cramping).  Genitourinary:       Negative for vaginal bleeding.   Physical Exam   Last menstrual period 12/07/2011.  Physical Exam  Constitutional: She is oriented to person, place, and time. She appears well-developed and well-nourished. No distress.  Neurological: She is alert and oriented to person, place, and time.  Psychiatric: She has a normal mood and affect. Her behavior is normal.    MAU Course  Procedures  MDM Care turned over to N. Bascom Levels, CNM at 20:23  Assessment and Plan    Matt Holmes 01/06/2012, 8:11 PM   Matt Holmes,  NP 01/06/12 2023  Results for orders placed during the hospital encounter of 01/06/12 (from the past 48 hour(s))  HCG, QUANTITATIVE, PREGNANCY     Status: Abnormal   Collection Time   01/06/12  8:20 PM      Component Value Range Comment   hCG, Beta Chain, Quant, S 3744 (*) <5 (mIU/mL)     A/P: 31 y.o. G2P0010 at [redacted]w[redacted]d Appropriate rise in quant HCG F/U 1 week for repeat u/s Precautions rev'd

## 2012-01-06 NOTE — Progress Notes (Signed)
Pt in for repeat BHCG.  Denies bleeding, continues to have cramping.

## 2012-01-07 NOTE — ED Provider Notes (Signed)
Chart reviewed and agree with management and plan.  

## 2012-01-07 NOTE — ED Provider Notes (Signed)
Attestation of Attending Supervision of Advanced Practitioner: Evaluation and management procedures were performed by the PA/NP/CNM/OB Fellow under my supervision/collaboration. Chart reviewed and agree with management and plan.  Cora Brierley V 01/07/2012 4:12 PM

## 2012-01-11 ENCOUNTER — Inpatient Hospital Stay (HOSPITAL_COMMUNITY): Payer: Medicaid Other

## 2012-01-11 ENCOUNTER — Inpatient Hospital Stay (HOSPITAL_COMMUNITY)
Admission: AD | Admit: 2012-01-11 | Discharge: 2012-01-11 | Disposition: A | Discharge status: Home / Self Care | Payer: Medicaid Other | Source: Ambulatory Visit | Attending: Obstetrics & Gynecology | Admitting: Obstetrics & Gynecology

## 2012-01-11 DIAGNOSIS — R109 Unspecified abdominal pain: Secondary | ICD-10-CM | POA: Insufficient documentation

## 2012-01-11 DIAGNOSIS — O99891 Other specified diseases and conditions complicating pregnancy: Secondary | ICD-10-CM | POA: Insufficient documentation

## 2012-01-11 DIAGNOSIS — Z3480 Encounter for supervision of other normal pregnancy, unspecified trimester: Secondary | ICD-10-CM

## 2012-01-11 DIAGNOSIS — Z331 Pregnant state, incidental: Secondary | ICD-10-CM

## 2012-01-11 IMAGING — US US OB TRANSVAGINAL
1 series · 14 of 21 positions shown · non-contrast
Comparison: none

[Series 1: us ob transvaginal · 14 of 21 slices shown]
[im 1/21]
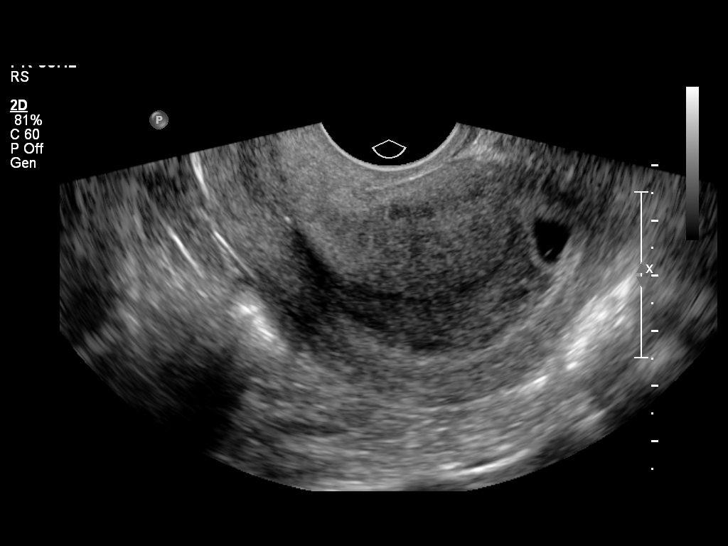
[im 3/21]
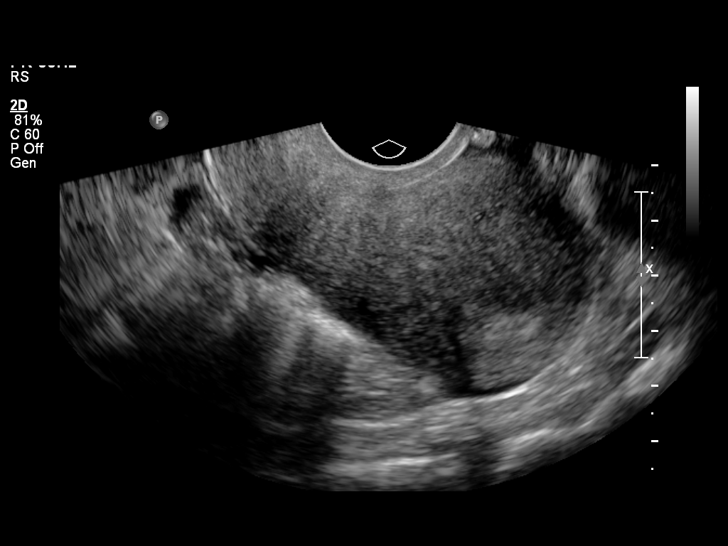
[im 4/21]
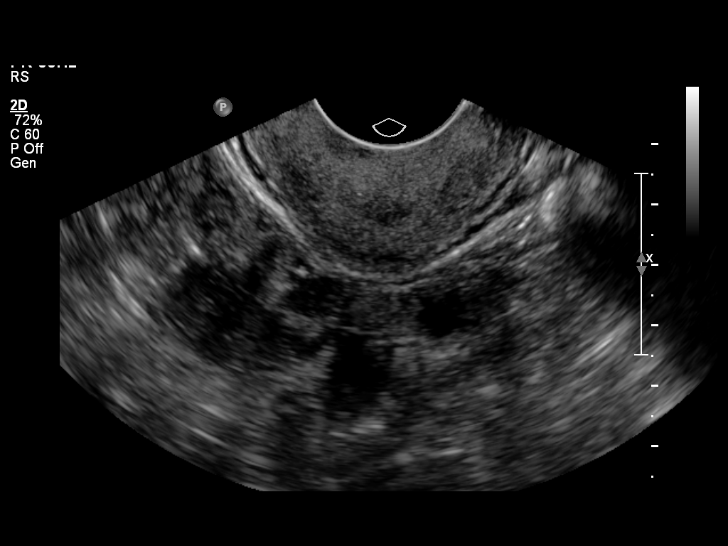
[im 6/21]
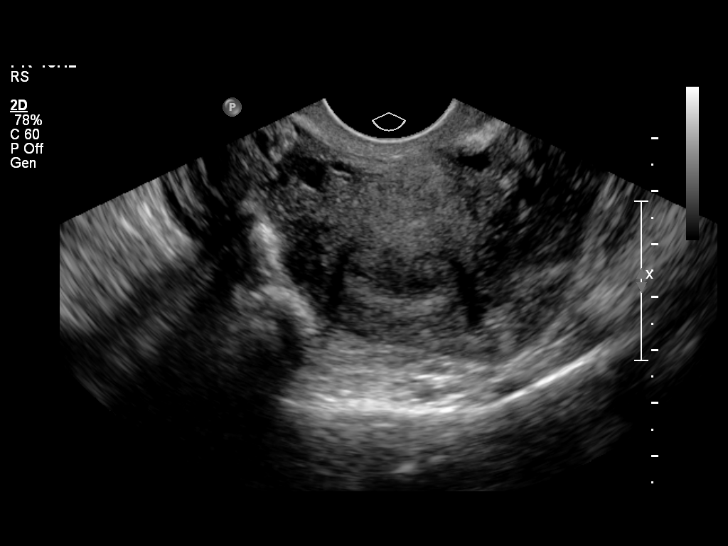
[im 7/21]
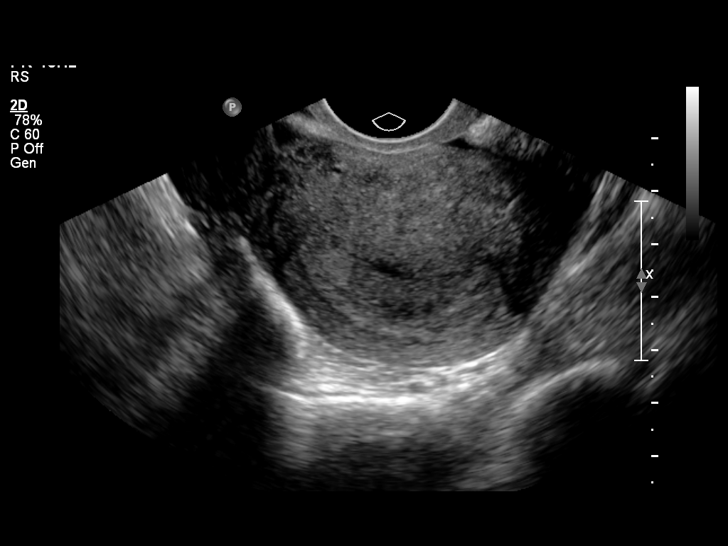
[im 9/21]
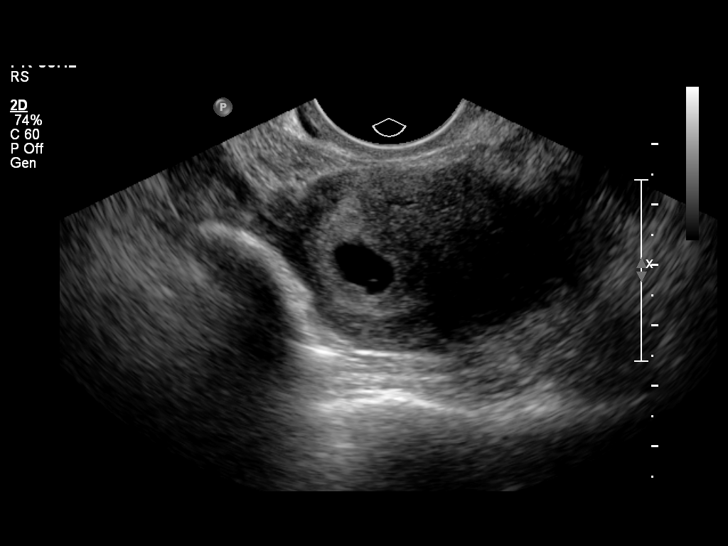
[im 10/21]
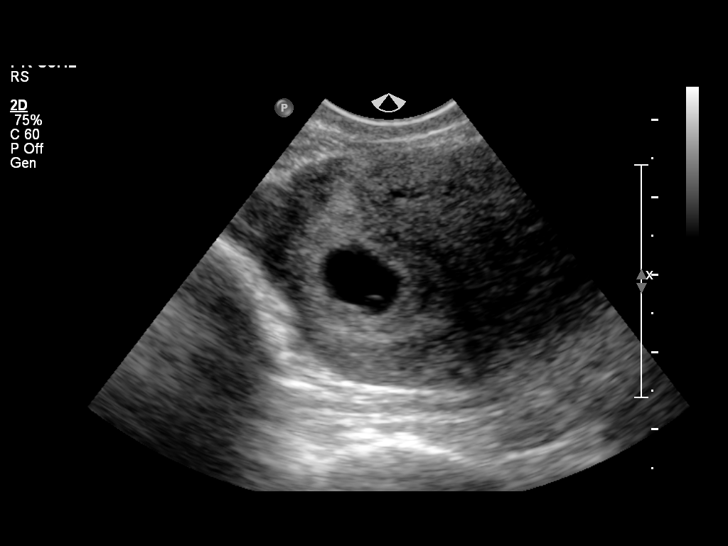
[im 12/21]
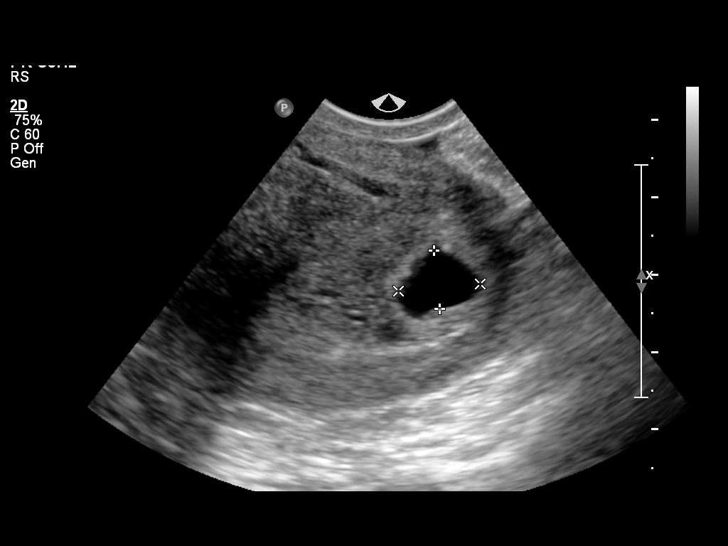
[im 13/21]
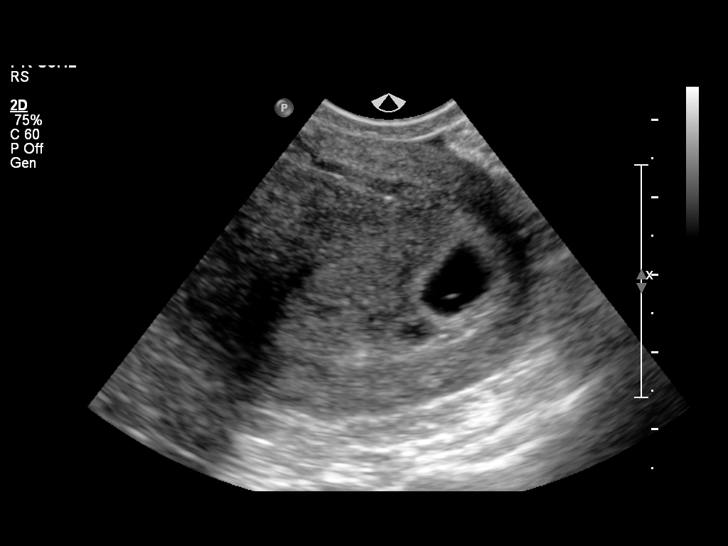
[im 15/21]
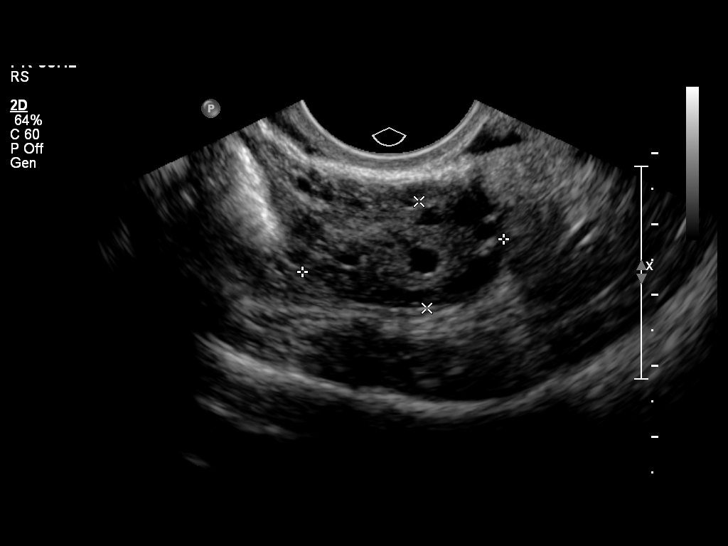
[im 16/21]
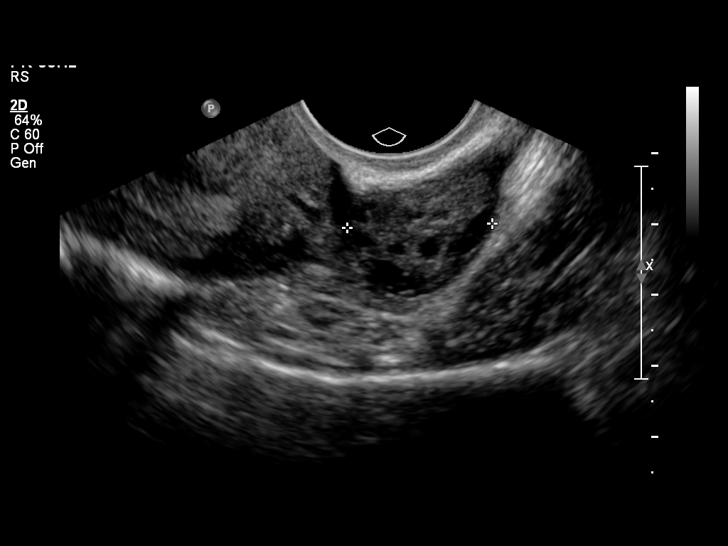
[im 18/21]
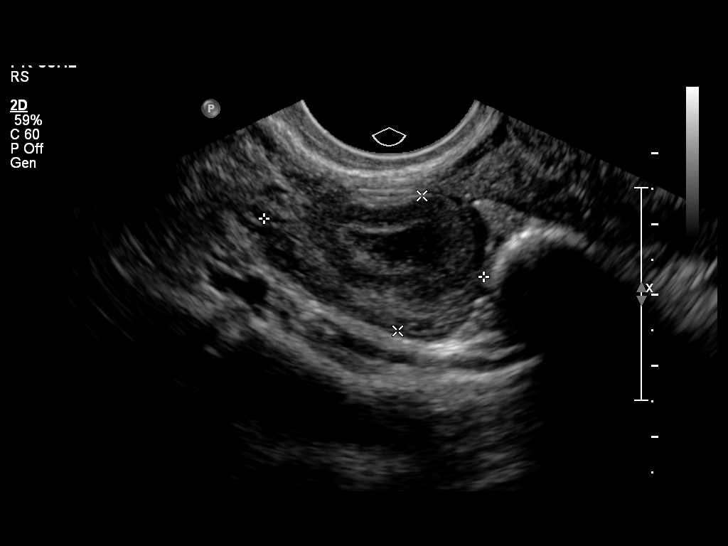
[im 19/21]
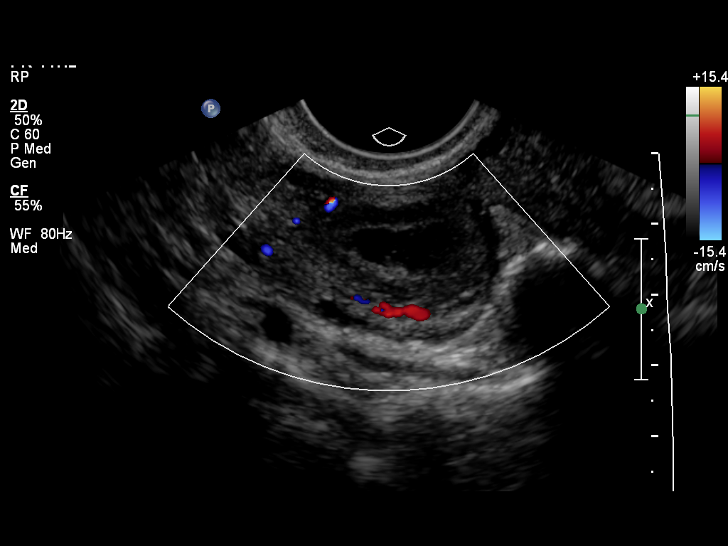
[im 21/21]
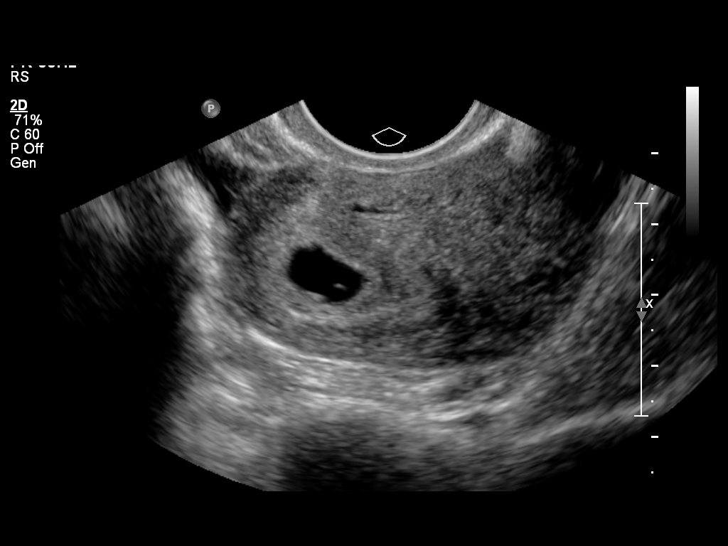

[14 of 21 positions shown; findings below may reference images not displayed]

OBSTETRICS REPORT
                      (Signed Final [DATE] [DATE])

                 CNM
Procedures

 US OB TRANSVAGINAL                                    76817.0
Indications

 Pain - Abdominal/Pelvic
 R/o ectopic pregnancy
Fetal Evaluation

 Preg. Location:    Intrauterine
 Gest. Sac:         Intrauterine
 Yolk Sac:          Visualized
 Fetal Pole:        Not visualized
 Cardiac Activity:  No embryo visualized
Biometry

 GS:        9.4 mm     G. Age:  5w 4d                   EDD:   [DATE]
Cervix Uterus Adnexa

 Cervix:       Normal appearance by transvaginal scan
 Cul De Sac:   No free fluid seen.

 Left Ovary:    Within normal limits.
 Right Ovary:   Within normal limits. Small corpus luteum noted.

 Adnexa:     No abnormality visualized.
Impression

 Intrauterine gestational sac and yolk sac. No embryo is
 visualized at this time, but not unexpected given the MSD.
Recommendations

 US for fetal anatomic evaluation at 18-19 wks GA, and earlier
 as indicated.
 questions or concerns.

## 2012-01-11 NOTE — ED Provider Notes (Signed)
History   Chief Complaint:  Abdominal Cramping   Debbie [name] is  31 y.o. G2P0010 Patient's last menstrual period was 12/07/2011.Marland Kitchen Patient is here for follow Ultrasound for ongoing surveillance of pregnancy status.   She is [redacted]w[redacted]d weeks gestation  by LMP.    Since her last visit, the patient is without new complaint.   The patient reports bleeding as  none now.    General ROS:  negative  Her previous Quantitative HCG values are:    Physical Exam   Blood pressure 104/68, pulse 63, temperature 97.7 F (36.5 C), resp. rate 16, last menstrual period 12/07/2011.  Focused Gynecological Exam: examination not indicated  Labs: No results found for this or any previous visit (from the past 24 hour(s)).  Ultrasound Studies:    See above Assessment:   [redacted]w[redacted]d weeks gestation   Plan: Discharge home FU with OB provider of choice Verification letter, MCD info, provider list given  Daevon Holdren E. 01/11/2012, 10:44 AM

## 2012-01-11 NOTE — Progress Notes (Signed)
Still having lower abdominal cramping 7/10 a little worse than before, no bleeding here for repeat ultrasound.

## 2012-01-11 NOTE — Discharge Instructions (Signed)
ABCs of Pregnancy A Antepartum care is very important. Be sure you see your doctor and get prenatal care as soon as you think you are pregnant. At this time, you will be tested for infection, genetic abnormalities and potential problems with you and the pregnancy. This is the time to discuss diet, exercise, work, medications, labor, pain medication during labor and the possibility of a cesarean delivery. Ask any questions that may concern you. It is important to see your doctor regularly throughout your pregnancy. Avoid exposure to toxic substances and chemicals - such as cleaning solvents, lead and mercury, some insecticides, and paint. Pregnant women should avoid exposure to paint fumes, and fumes that cause you to feel ill, dizzy or faint. When possible, it is a good idea to have a pre-pregnancy consultation with your caregiver to begin some important recommendations your caregiver suggests such as, taking folic acid, exercising, quitting smoking, avoiding alcoholic beverages, etc. B Breastfeeding is the healthiest choice for both you and your baby. It has many nutritional benefits for the baby and health benefits for the mother. It also creates a very tight and loving bond between the baby and mother. Talk to your doctor, your family and friends, and your employer about how you choose to feed your baby and how they can support you in your decision. Not all birth defects can be prevented, but a woman can take actions that may increase her chance of having a healthy baby. Many birth defects happen very early in pregnancy, sometimes before a woman even knows she is pregnant. Birth defects or abnormalities of any child in your or the father's family should be discussed with your caregiver. Get a good support bra as your breast size changes. Wear it especially when you exercise and when nursing.  C Celebrate the news of your pregnancy with the your spouse/father and family. Childbirth classes are helpful to  take for you and the spouse/father because it helps to understand what happens during the pregnancy, labor and delivery. Cesarean delivery should be discussed with your doctor so you are prepared for that possibility. The pros and cons of circumcision if it is a boy, should be discussed with your pediatrician. Cigarette smoking during pregnancy can result in low birth weight babies. It has been associated with infertility, miscarriages, tubal pregnancies, infant death (mortality) and poor health (morbidity) in childhood. Additionally, cigarette smoking may cause long-term learning disabilities. If you smoke, you should try to quit before getting pregnant and not smoke during the pregnancy. Secondary smoke may also harm a mother and her developing baby. It is a good idea to ask people to stop smoking around you during your pregnancy and after the baby is born. Extra calcium is necessary when you are pregnant and is found in your prenatal vitamin, in dairy products, green leafy vegetables and in calcium supplements. D A healthy diet according to your current weight and height, along with vitamins and mineral supplements should be discussed with your caregiver. Domestic abuse or violence should be made known to your doctor right away to get the situation corrected. Drink more water when you exercise to keep hydrated. Discomfort of your back and legs usually develops and progresses from the middle of the second trimester through to delivery of the baby. This is because of the enlarging baby and uterus, which may also affect your balance. Do not take illegal drugs. Illegal drugs can seriously harm the baby and you. Drink extra fluids (water is best) throughout pregnancy to help  your body keep up with the increases in your blood volume. Drink at least 6 to 8 glasses of water, fruit juice, or milk each day. A good way to know you are drinking enough fluid is when your urine looks almost like clear water or is very light  yellow.  E Eat healthy to get the nutrients you and your unborn baby need. Your meals should include the five basic food groups. Exercise (30 minutes of light to moderate exercise a day) is important and encouraged during pregnancy, if there are no medical problems or problems with the pregnancy. Exercise that causes discomfort or dizziness should be stopped and reported to your caregiver. Emotions during pregnancy can change from being ecstatic to depression and should be understood by you, your partner and your family. F Fetal screening with ultrasound, amniocentesis and monitoring during pregnancy and labor is common and sometimes necessary. Take 400 micrograms of folic acid daily both before, when possible, and during the first few months of pregnancy to reduce the risk of birth defects of the brain and spine. All women who could possibly become pregnant should take a vitamin with folic acid, every day. It is also important to eat a healthy diet with fortified foods (enriched grain products, including cereals, rice, breads, and pastas) and foods with natural sources of folate (orange juice, green leafy vegetables, beans, peanuts, broccoli, asparagus, peas, and lentils). The father should be involved with all aspects of the pregnancy including, the prenatal care, childbirth classes, labor, delivery, and postpartum time. Fathers may also have emotional concerns about being a father, financial needs, and raising a family. G Genetic testing should be done appropriately. It is important to know your family and the father's history. If there have been problems with pregnancies or birth defects in your family, report these to your doctor. Also, genetic counselors can talk with you about the information you might need in making decisions about having a family. You can call a major medical center in your area for help in finding a board-certified genetic counselor. Genetic testing and counseling should be done  before pregnancy when possible, especially if there is a history of problems in the mother's or father's family. Certain ethnic backgrounds are more at risk for genetic defects. H Get familiar with the hospital where you will be having your baby. Get to know how long it takes to get there, the labor and delivery area, and the hospital procedures. Be sure your medical insurance is accepted there. Get your home ready for the baby including, clothes, the baby's room (when possible), furniture and car seat. Hand washing is important throughout the day, especially after handling raw meat and poultry, changing the baby's diaper or using the bathroom. This can help prevent the spread of many bacteria and viruses that cause infection. Your hair may become dry and thinner, but will return to normal a few weeks after the baby is born. Heartburn is a common problem that can be treated by taking antacids recommended by your caregiver, eating smaller meals 5 or 6 times a day, not drinking liquids when eating, drinking between meals and raising the head of your bed 2 to 3 inches. I Insurance to cover you, the baby, doctor and hospital should be reviewed so that you will be prepared to pay any costs not covered by your insurance plan. If you do not have medical insurance, there are usually clinics and services available for you in your community. Take 30 milligrams of iron during  your pregnancy as prescribed by your doctor to reduce the risk of low red blood cells (anemia) later in pregnancy. All women of childbearing age should eat a diet rich in iron. J There should be a joint effort for the mother, father and any other children to adapt to the pregnancy financially, emotionally, and psychologically during the pregnancy. Join a support group for moms-to-be. Or, join a class on parenting or childbirth. Have the family participate when possible. K Know your limits. Let your caregiver know if you experience any of the  following:   Pain of any kind.   Strong cramps.   You develop a lot of weight in a short period of time (5 pounds in 3 to 5 days).   Vaginal bleeding, leaking of amniotic fluid.   Headache, vision problems.   Dizziness, fainting, shortness of breath.   Chest pain.   Fever of 102 F (38.9 C) or higher.   Gush of clear fluid from your vagina.   Painful urination.   Domestic violence.   Irregular heartbeat (palpitations).   Rapid beating of the heart (tachycardia).   Constant feeling sick to your stomach (nauseous) and vomiting.   Trouble walking, fluid retention (edema).   Muscle weakness.   If your baby has decreased activity.   Persistent diarrhea.   Abnormal vaginal discharge.   Uterine contractions at 20-minute intervals.   Back pain that travels down your leg.  L Learn and practice that what you eat and drink should be in moderation and healthy for you and your baby. Legal drugs such as alcohol and caffeine are important issues for pregnant women. There is no safe amount of alcohol a woman can drink while pregnant. Fetal alcohol syndrome, a disorder characterized by growth retardation, facial abnormalities, and central nervous system dysfunction, is caused by a woman's use of alcohol during pregnancy. Caffeine, found in tea, coffee, soft drinks and chocolate, should also be limited. Be sure to read labels when trying to cut down on caffeine during pregnancy. More than 200 foods, beverages, and over-the-counter medications contain caffeine and have a high salt content! There are coffees and teas that do not contain caffeine. M Medical conditions such as diabetes, epilepsy, and high blood pressure should be treated and kept under control before pregnancy when possible, but especially during pregnancy. Ask your caregiver about any medications that may need to be changed or adjusted during pregnancy. If you are currently taking any medications, ask your caregiver if it  is safe to take them while you are pregnant or before getting pregnant when possible. Also, be sure to discuss any herbs or vitamins you are taking. They are medicines, too! Discuss with your doctor all medications, prescribed and over-the-counter, that you are taking. During your prenatal visit, discuss the medications your doctor may give you during labor and delivery. N Never be afraid to ask your doctor or caregiver questions about your health, the progress of the pregnancy, family problems, stressful situations, and recommendation for a pediatrician, if you do not have one. It is better to take all precautions and discuss any questions or concerns you may have during your office visits. It is a good idea to write down your questions before you visit the doctor. O Over-the-counter cough and cold remedies may contain alcohol or other ingredients that should be avoided during pregnancy. Ask your caregiver about prescription, herbs or over-the-counter medications that you are taking or may consider taking while pregnant.  P Physical activity during pregnancy can  benefit both you and your baby by lessening discomfort and fatigue, providing a sense of well-being, and increasing the likelihood of early recovery after delivery. Light to moderate exercise during pregnancy strengthens the belly (abdominal) and back muscles. This helps improve posture. Practicing yoga, walking, swimming, and cycling on a stationary bicycle are usually safe exercises for pregnant women. Avoid scuba diving, exercise at high altitudes (over 3000 feet), skiing, horseback riding, contact sports, etc. Always check with your doctor before beginning any kind of exercise, especially during pregnancy and especially if you did not exercise before getting pregnant. Q Queasiness, stomach upset and morning sickness are common during pregnancy. Eating a couple of crackers or dry toast before getting out of bed. Foods that you normally love may  make you feel sick to your stomach. You may need to substitute other nutritious foods. Eating 5 or 6 small meals a day instead of 3 large ones may make you feel better. Do not drink with your meals, drink between meals. Questions that you have should be written down and asked during your prenatal visits. R Read about and make plans to baby-proof your home. There are important tips for making your home a safer environment for your baby. Review the tips and make your home safer for you and your baby. Read food labels regarding calories, salt and fat content in the food. S Saunas, hot tubs, and steam rooms should be avoided while you are pregnant. Excessive high heat may be harmful during your pregnancy. Your caregiver will screen and examine you for sexually transmitted diseases and genetic disorders during your prenatal visits. Learn the signs of labor. Sexual relations while pregnant is safe unless there is a medical or pregnancy problem and your caregiver advises against it. T Traveling long distances should be avoided especially in the third trimester of your pregnancy. If you do have to travel out of state, be sure to take a copy of your medical records and medical insurance plan with you. You should not travel long distances without seeing your doctor first. Most airlines will not allow you to travel after 36 weeks of pregnancy. Toxoplasmosis is an infection caused by a parasite that can seriously harm an unborn baby. Avoid eating undercooked meat and handling cat litter. Be sure to wear gloves when gardening. Tingling of the hands and fingers is not unusual and is due to fluid retention. This will go away after the baby is born. U Womb (uterus) size increases during the first trimester. Your kidneys will begin to function more efficiently. This may cause you to feel the need to urinate more often. You may also leak urine when sneezing, coughing or laughing. This is due to the growing uterus pressing  against your bladder, which lies directly in front of and slightly under the uterus during the first few months of pregnancy. If you experience burning along with frequency of urination or bloody urine, be sure to tell your doctor. The size of your uterus in the third trimester may cause a problem with your balance. It is advisable to maintain good posture and avoid wearing high heels during this time. An ultrasound of your baby may be necessary during your pregnancy and is safe for you and your baby. V Vaccinations are an important concern for pregnant women. Get needed vaccines before pregnancy. Center for Disease Control (http://www.wolf.info/) has clear guidelines for the use of vaccines during pregnancy. Review the list, be sure to discuss it with your doctor. Prenatal vitamins are helpful  and healthy for you and the baby. Do not take extra vitamins except what is recommended. Taking too much of certain vitamins can cause overdose problems. Continuous vomiting should be reported to your caregiver. Varicose veins may appear especially if there is a family history of varicose veins. They should subside after the delivery of the baby. Support hose helps if there is leg discomfort. W Being overweight or underweight during pregnancy may cause problems. Try to get within 15 pounds of your ideal weight before pregnancy. Remember, pregnancy is not a time to be dieting! Do not stop eating or start skipping meals as your weight increases. Both you and your baby need the calories and nutrition you receive from a healthy diet. Be sure to consult with your doctor about your diet. There is a formula and diet plan available depending on whether you are overweight or underweight. Your caregiver or nutritionist can help and advise you if necessary. X Avoid X-rays. If you must have dental work or diagnostic tests, tell your dentist or physician that you are pregnant so that extra care can be taken. X-rays should only be taken when  the risks of not taking them outweigh the risk of taking them. If needed, only the minimum amount of radiation should be used. When X-rays are necessary, protective lead shields should be used to cover areas of the body that are not being X-rayed. Y Your baby loves you. Breastfeeding your baby creates a loving and very close bond between the two of you. Give your baby a healthy environment to live in while you are pregnant. Infants and children require constant care and guidance. Their health and safety should be carefully watched at all times. After the baby is born, rest or take a nap when the baby is sleeping. Z Get your ZZZs. Be sure to get plenty of rest. Resting on your side as often as possible, especially on your left side is advised. It provides the best circulation to your baby and helps reduce swelling. Try taking a nap for 30 to 45 minutes in the afternoon when possible. After the baby is born rest or take a nap when the baby is sleeping. Try elevating your feet for that amount of time when possible. It helps the circulation in your legs and helps reduce swelling.  Most information courtesy of the CDC. Document Released: 11/10/2005 Document Revised: 07/23/2011 Document Reviewed: 07/25/2009 Exit[employer] Patient Information 2012 [place].   ________________________________________     To schedule your Maternity Eligibility Appointment, please call [phone].  When you arrive for your appointment you must bring the following items or information listed below.  Your appointment will be rescheduled if you do not have these items or are 15 minutes late. If currently receiving Medicaid, you MUST bring: 1. Medicaid Card 2. Social Security Card 3. Picture ID 4. Proof of Pregnancy 5. Verification of current address if the address on Medicaid card is incorrect "postmarked mail" If not receiving Medicaid, you MUST bring: 1. Social Security Card 2. Picture ID 3. Birth Certificate (if  available) Passport or *Green Card 4. Proof of Pregnancy 5. Verification of current address "postmarked mail" for each income presented. 6. Verification of insurance coverage, if any 7. Check stubs from each employer for the previous month (if unable to present check stub  for each week, we will accept check stub for the first and last week ill the same month.) If you can't locate check stubs, you must bring a letter from the employer(s)  and it must have the following information on letterhead, typed, in English: o name of company o company telephone number o how long been with the company, if less than one month o how much person earns per hour o how many hours per week work o the gross pay the person earned for the previous month If you are 30 years old or less, you do not have to bring proof of income unless you work or live with the father of the baby and at that time we will need proof of income from you and/or the father of the baby. Green Card recipients are eligible for Medicaid for Pregnant Women (MPW)   Prenatal Care Providers [facility]    [facility]  [phone]     Phone: [phone]          [facility]                      [facility]  @[place]     Phone: [phone]  Phone: [phone]         [facility]     Phone: [phone]  Phone: [phone]           [facility] @ [place]                hone: [phone]  Phone: [phone]         [facility] Dr. Francoise Ceo      Phone: [phone]  Phone: [phone]         [facility]                Phone: [phone]  [facility]   Phone:[phone]    [facility] [address] [place])          Phone: [phone] [facility]   Phone: [phone]

## 2012-04-14 ENCOUNTER — Encounter (HOSPITAL_COMMUNITY): Payer: Self-pay | Admitting: *Deleted

## 2012-04-14 ENCOUNTER — Inpatient Hospital Stay (HOSPITAL_COMMUNITY)
Admission: AD | Admit: 2012-04-14 | Discharge: 2012-04-15 | Disposition: A | Discharge status: Home / Self Care | Payer: Self-pay | Source: Ambulatory Visit | Attending: Obstetrics & Gynecology | Admitting: Obstetrics & Gynecology

## 2012-04-14 DIAGNOSIS — Z3490 Encounter for supervision of normal pregnancy, unspecified, unspecified trimester: Secondary | ICD-10-CM

## 2012-04-14 DIAGNOSIS — B9689 Other specified bacterial agents as the cause of diseases classified elsewhere: Secondary | ICD-10-CM | POA: Insufficient documentation

## 2012-04-14 DIAGNOSIS — B373 Candidiasis of vulva and vagina: Secondary | ICD-10-CM | POA: Insufficient documentation

## 2012-04-14 DIAGNOSIS — A499 Bacterial infection, unspecified: Secondary | ICD-10-CM | POA: Insufficient documentation

## 2012-04-14 DIAGNOSIS — O26859 Spotting complicating pregnancy, unspecified trimester: Secondary | ICD-10-CM | POA: Insufficient documentation

## 2012-04-14 DIAGNOSIS — O2390 Unspecified genitourinary tract infection in pregnancy, unspecified trimester: Secondary | ICD-10-CM | POA: Insufficient documentation

## 2012-04-14 DIAGNOSIS — N760 Acute vaginitis: Secondary | ICD-10-CM | POA: Insufficient documentation

## 2012-04-14 DIAGNOSIS — N939 Abnormal uterine and vaginal bleeding, unspecified: Secondary | ICD-10-CM

## 2012-04-14 DIAGNOSIS — B3731 Acute candidiasis of vulva and vagina: Secondary | ICD-10-CM | POA: Insufficient documentation

## 2012-04-14 NOTE — MAU Note (Signed)
Pt G2 P0 at 18.3wks, blood on tissue tonight after using the bathroom.  Lower abd cramping and tightness x 15 min.

## 2012-04-14 NOTE — MAU Note (Signed)
Pt states she started bleeding 2315. Pt states she started feeling cramping soon after that. Pt states she wiped and saw blood x1. After using bathroom here pt states she only saw light pink .

## 2012-04-15 LAB — WET PREP, GENITAL

## 2012-04-15 LAB — URINE MICROSCOPIC-ADD ON

## 2012-04-15 LAB — URINALYSIS, ROUTINE W REFLEX MICROSCOPIC
Glucose, UA: NEGATIVE mg/dL
Leukocytes, UA: NEGATIVE
Protein, ur: NEGATIVE mg/dL
Urobilinogen, UA: 0.2 mg/dL (ref 0.0–1.0)

## 2012-04-15 MED ORDER — METRONIDAZOLE 500 MG PO TABS: 500.0000 mg | ORAL_TABLET | Freq: Two times a day (BID) | ORAL | Status: AC

## 2012-04-15 MED ORDER — FLUCONAZOLE 150 MG PO TABS
150.0000 mg | ORAL_TABLET | ORAL | Status: AC
  Administered 2012-04-15: 150 mg via ORAL
  Filled 2012-04-15: qty 1

## 2012-04-15 NOTE — Discharge Instructions (Signed)
Vaginal Bleeding During Pregnancy, Second Trimester A small amount of bleeding (spotting) is relatively common in pregnancy. It usually stops on its own. There are many causes for bleeding or spotting in pregnancy. Some bleeding may be related to the pregnancy and some may not. Cramping with the bleeding is more serious and concerning. Tell your caregiver if you have any vaginal bleeding.  CAUSES   Infection, inflammation or growths on the cervix.   The placenta may partially or completely be covering the opening of the cervix inside the uterus.   The placenta may have separated from the uterus.   You may be having early/preterm labor.   The cervix is not strong enough to keep a baby inside the uterus (cervical insufficiency).   Many tiny cysts in the uterus instead of pregnancy tissue (molar pregnancy)  SYMPTOMS   Vaginal spotting or bleeding with or without cramps.   Uterine contractions.   Abnormal vaginal discharge.   You may have spotting or spotting after having sexual intercourse.  DIAGNOSIS  To evaluate the pregnancy, your caregiver may:  Do a pelvic exam.   Take blood tests.   Do an ultrasound.  It is very important to follow your caregiver's instructions.  TREATMENT   Evaluation of the pregnancy with blood tests and ultrasound.   Bed rest (getting up to use the bathroom only).   Rho-gam immunization if the mother is Rh negative and the father is Rh positive.   If you are having uterine contractions, you may be given medication to stop the contractions.   If you have cervical insufficiency, you may have a suture placed in the cervix to close it.  HOME CARE INSTRUCTIONS   If your caregiver orders bed rest, you may need to make arrangements for the care of other children and for any other responsibilities. However, your caregiver may allow you to continue light activity.   Keep track of the number of pads you use each day and how soaked (saturated) they are.  Write this down.   Do not use tampons. Do not douche.   Do not have sexual intercourse or orgasms until approved by your physician.   Save any tissue that you pass for your caregiver to see.   Take medicine for cramps only with your caregiver's permission.   Do not take aspirin because it can make you bleed.   Do not exercise, do any strenuous activities or heavy lifting without your caregiver's permission.  SEEK IMMEDIATE MEDICAL CARE IF:   You experience severe cramps in your stomach, back or belly (abdomen).   You have uterine contractions.   You have an oral temperature above 102 F (38.9 C), not controlled by medicine.   You develop chills.   You pass large clots or tissue.   Your bleeding increases or you become light-headed, weak or have fainting episodes.   You have leaking or a gush of fluid from your vagina.  Document Released: 08/20/2005 Document Revised: 10/30/2011 Document Reviewed: 03/01/2009 [facility] Patient Information 2012 [place], [place].  Bacterial Vaginosis Bacterial vaginosis (BV) is a vaginal infection where the normal balance of bacteria in the vagina is disrupted. The normal balance is then replaced by an overgrowth of certain bacteria. There are several different kinds of bacteria that can cause BV. BV is the most common vaginal infection in women of childbearing age. CAUSES   The cause of BV is not fully understood. BV develops when there is an increase or imbalance of harmful bacteria.  Some activities or behaviors can upset the normal balance of bacteria in the vagina and put women at increased risk including:   Having a new sex partner or multiple sex partners.   Douching.   Using an intrauterine device (IUD) for contraception.   It is not clear what role sexual activity plays in the development of BV. However, women that have never had sexual intercourse are rarely infected with BV.  Women do not get BV from toilet seats, bedding, swimming  pools or from touching objects around them.  SYMPTOMS   Grey vaginal discharge.   A fish-like odor with discharge, especially after sexual intercourse.   Itching or burning of the vagina and vulva.   Burning or pain with urination.   Some women have no signs or symptoms at all.  DIAGNOSIS  Your caregiver must examine the vagina for signs of BV. Your caregiver will perform lab tests and look at the sample of vaginal fluid through a microscope. They will look for bacteria and abnormal cells (clue cells), a pH test higher than 4.5, and a positive amine test all associated with BV.  RISKS AND COMPLICATIONS   Pelvic inflammatory disease (PID).   Infections following gynecology surgery.   Developing HIV.   Developing herpes virus.  TREATMENT  Sometimes BV will clear up without treatment. However, all women with symptoms of BV should be treated to avoid complications, especially if gynecology surgery is planned. Female partners generally do not need to be treated. However, BV may spread between female sex partners so treatment is helpful in preventing a recurrence of BV.   BV may be treated with antibiotics. The antibiotics come in either pill or vaginal cream forms. Either can be used with nonpregnant or pregnant women, but the recommended dosages differ. These antibiotics are not harmful to the baby.   BV can recur after treatment. If this happens, a second round of antibiotics will often be prescribed.   Treatment is important for pregnant women. If not treated, BV can cause a premature delivery, especially for a pregnant woman who had a premature birth in the past. All pregnant women who have symptoms of BV should be checked and treated.   For chronic reoccurrence of BV, treatment with a type of prescribed gel vaginally twice a week is helpful.  HOME CARE INSTRUCTIONS   Finish all medication as directed by your caregiver.   Do not have sex until treatment is completed.   Tell your  sexual partner that you have a vaginal infection. They should see their caregiver and be treated if they have problems, such as a mild rash or itching.   Practice safe sex. Use condoms. Only have 1 sex partner.  PREVENTION  Basic prevention steps can help reduce the risk of upsetting the natural balance of bacteria in the vagina and developing BV:  Do not have sexual intercourse (be abstinent).   Do not douche.   Use all of the medicine prescribed for treatment of BV, even if the signs and symptoms go away.   Tell your sex partner if you have BV. That way, they can be treated, if needed, to prevent reoccurrence.  SEEK MEDICAL CARE IF:   Your symptoms are not improving after 3 days of treatment.   You have increased discharge, pain, or fever.  MAKE SURE YOU:   Understand these instructions.   Will watch your condition.   Will get help right away if you are not doing well or get worse.  FOR MORE INFORMATION  Division of STD Prevention (DSTDP), Centers for Disease Control and Prevention: [employer]Apps.[employer] American Social Health Association (ASHA): www.ashastd.org  Document Released: 11/10/2005 Document Revised: 10/30/2011 Document Reviewed: 05/03/2009 [facility] Patient Information 2012 [place], [place].  Candidal Vulvovaginitis Candidal vulvovaginitis is an infection of the vagina and vulva. The vulva is the skin around the opening of the vagina. This may cause itching and discomfort in and around the vagina.  HOME CARE  Only take medicine as told by your doctor.   Do not have sex (intercourse) until the infection is healed or as told by your doctor.   Practice safe sex.   Tell your sex partner about your infection.   Do not douche or use tampons.   Wear cotton underwear. Do not wear tight pants or panty hose.   Eat yogurt. This may help treat and prevent yeast infections.  GET HELP RIGHT AWAY IF:   You have a fever.   Your problems get worse during treatment or do not  get better in 3 days.   You have discomfort, irritation, or itching in your vagina or vulva area.   You have pain after sex.   You start to get belly (abdominal) pain.  MAKE SURE YOU:  Understand these instructions.   Will watch your condition.   Will get help right away if you are not doing well or get worse.  Document Released: 02/06/2009 Document Revised: 10/30/2011 Document Reviewed: 02/06/2009 [facility] Patient Information 2012 [place], [place].

## 2012-04-15 NOTE — MAU Provider Note (Signed)
Debbie [name] y.o.G2P0010 @[redacted]w[redacted]d  by LMP Chief Complaint  Patient presents with  . Vaginal Bleeding     None     SUBJECTIVE  HPI: Pt presents to MAU with episode of vaginal spotting at 2315 tonight.  She describes the bleeding as one spot on her toilet paper of bright red blood following a bowel movement.  She has seen 2 more episodes of pink mucous when wiping since then. The last time she had intercourse was 3 days ago.  She had an appointment with [facility] but does not have money to pay up front cost and Medicaid has not come through yet.  She reports good fetal movement, denies LOF, vaginal itching/burning, urinary symptoms, h/a, dizziness, n/v, or fever/chills.    Past Medical History  Diagnosis Date  . No pertinent past medical history    Past Surgical History  Procedure Date  . Shoulder capsulorrhaphy   . Laparscopy    History   Social History  . Marital Status: Divorced    Spouse Name: N/A    Number of Children: N/A  . Years of Education: N/A   Occupational History  . Not on file.   Social History Main Topics  . Smoking status: Never Smoker   . Smokeless tobacco: Not on file  . Alcohol Use: No  . Drug Use: No  . Sexually Active: Yes   Other Topics Concern  . Not on file   Social History Narrative  . No narrative on file   No current facility-administered medications on file prior to encounter.   Current Outpatient Prescriptions on File Prior to Encounter  Medication Sig Dispense Refill  . calcium gluconate 500 MG tablet Take 500 mg by mouth daily.      . cholecalciferol (VITAMIN D) 1000 UNITS tablet Take 1,000 Units by mouth daily.      . folic acid (FOLVITE) 400 MCG tablet Take 400 mcg by mouth daily.      . Prenatal Vit-Fe Fumarate-FA (PRENATAL MULTIVITAMIN) TABS Take 1 tablet by mouth daily.       Allergies  Allergen Reactions  . Tylenol (Acetaminophen) Anaphylaxis  . Aspirin Other (See Comments)    Unknown childhood rxn  . Benadryl (Diphenhydramine  Hcl) Itching  . Dilaudid (Hydromorphone Hcl) Itching    ROS: Pertinent items in HPI  OBJECTIVE Blood pressure 123/73, pulse 81, temperature 98.3 F (36.8 C), temperature source Oral, resp. rate 16, height 5\' 4"  (1.626 m), weight 85.276 kg (188 lb), last menstrual period 12/07/2011.  GENERAL: Well-developed, well-nourished female in no acute distress.  HEENT: Normocephalic, good dentition HEART: normal rate RESP: normal effort ABDOMEN: Soft, nontender EXTREMITIES: Nontender, no edema NEURO: Alert and oriented Pelvic exam: Cervix pink, visually closed, without lesion, scant white creamy discharge, no bleeding noted,vaginal walls and external genitalia normal Bimanual exam: Cervix 0/long/high, firm, posterior, no blood on glove following exam  FHT 157 with doppler  ASSESSMENT Vaginal spotting vs hemorrhoidal bleeding with BM Bacterial vaginosis Candida vaginitis  PLAN D/C home with bleeding precautions Light activity, no intercourse for next few days Schedule prenatal visits as soon as possible, list of providers given Anatomy scan scheduled outpatient for next week Return to MAU as needed   LEFTWICH-KIRBY, Jerami Tammen 04/15/2012 1:20 AM

## 2012-04-16 LAB — GC/CHLAMYDIA PROBE AMP, GENITAL: GC Probe Amp, Genital: NEGATIVE

## 2012-04-29 ENCOUNTER — Ambulatory Visit (HOSPITAL_COMMUNITY)
Admission: RE | Admit: 2012-04-29 | Discharge: 2012-04-29 | Disposition: A | Discharge status: Home / Self Care | Payer: Medicaid Other | Source: Ambulatory Visit | Attending: Advanced Practice Midwife | Admitting: Advanced Practice Midwife

## 2012-04-29 DIAGNOSIS — O358XX0 Maternal care for other (suspected) fetal abnormality and damage, not applicable or unspecified: Secondary | ICD-10-CM | POA: Insufficient documentation

## 2012-04-29 DIAGNOSIS — O09299 Supervision of pregnancy with other poor reproductive or obstetric history, unspecified trimester: Secondary | ICD-10-CM | POA: Insufficient documentation

## 2012-04-29 DIAGNOSIS — Z363 Encounter for antenatal screening for malformations: Secondary | ICD-10-CM | POA: Insufficient documentation

## 2012-04-29 DIAGNOSIS — Z1389 Encounter for screening for other disorder: Secondary | ICD-10-CM | POA: Insufficient documentation

## 2012-04-29 DIAGNOSIS — Z3490 Encounter for supervision of normal pregnancy, unspecified, unspecified trimester: Secondary | ICD-10-CM

## 2012-04-29 DIAGNOSIS — O0930 Supervision of pregnancy with insufficient antenatal care, unspecified trimester: Secondary | ICD-10-CM | POA: Insufficient documentation

## 2012-04-29 IMAGING — US US OB DETAIL+14 WK
1 of 2 series · 12 of 28 positions shown · non-contrast
Comparison: none

[Series 1: us ob detail +14 wk · 12 of 71 slices shown]
[im 1/71]
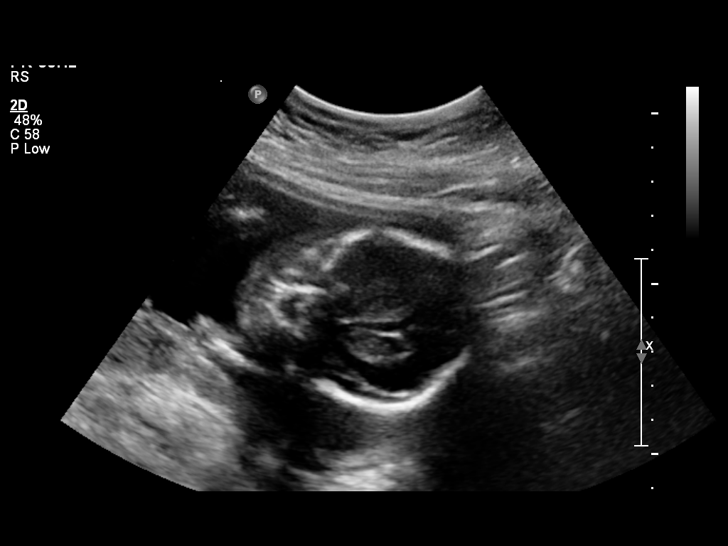
[im 6/71]
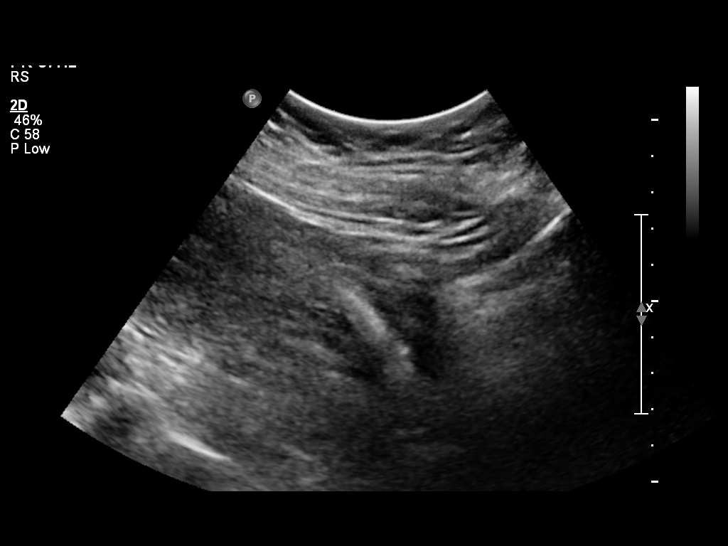
[im 11/71]
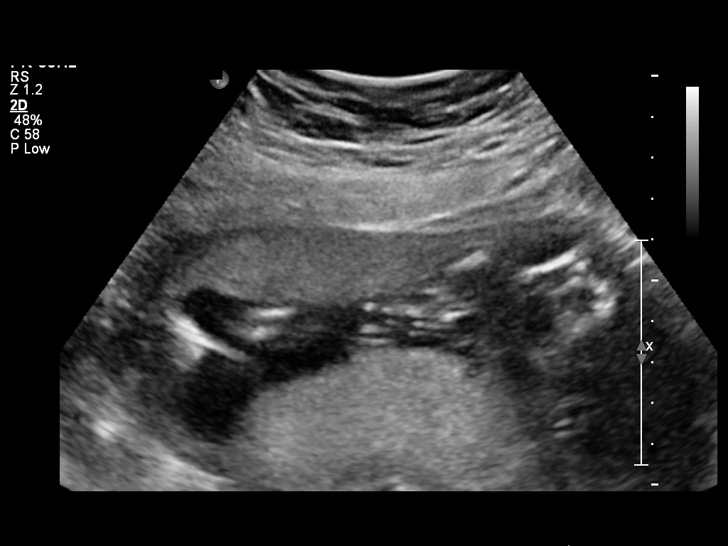
[im 19/71]
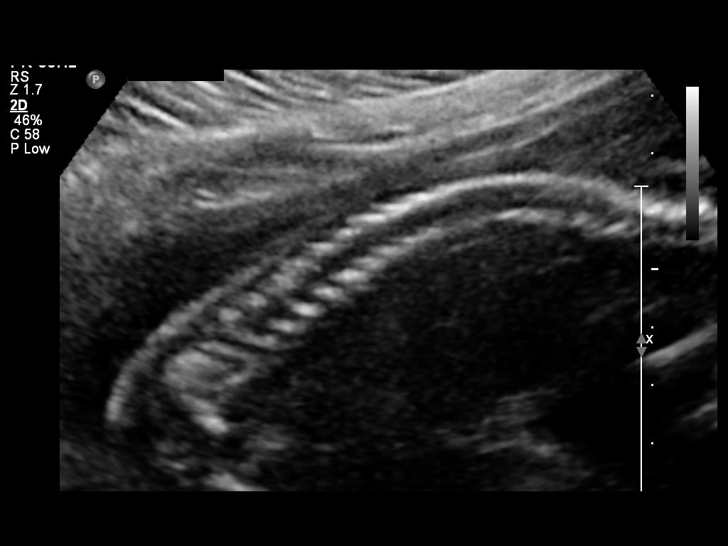
[im 25/71]
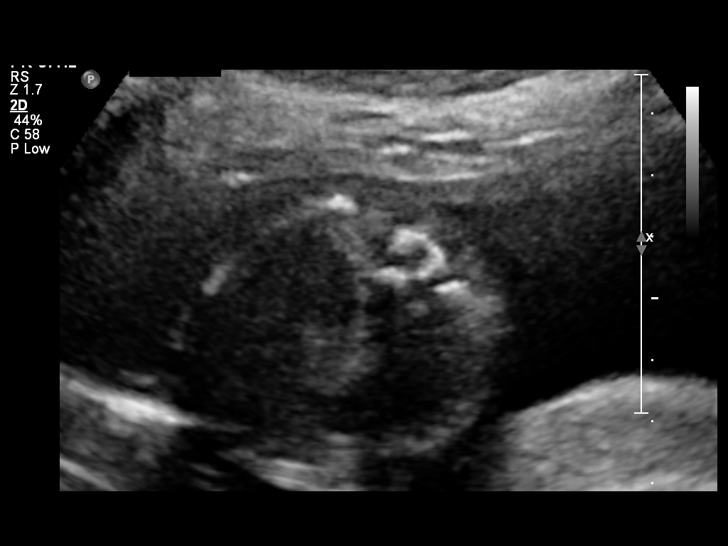
[im 30/71]
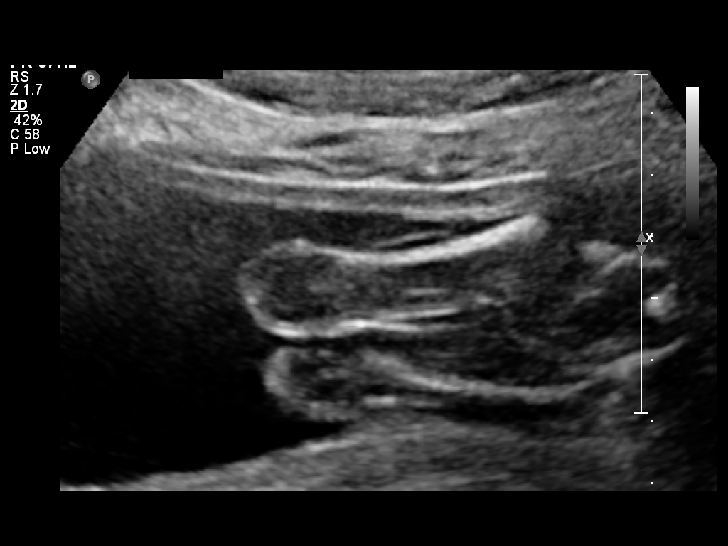
[im 38/71]
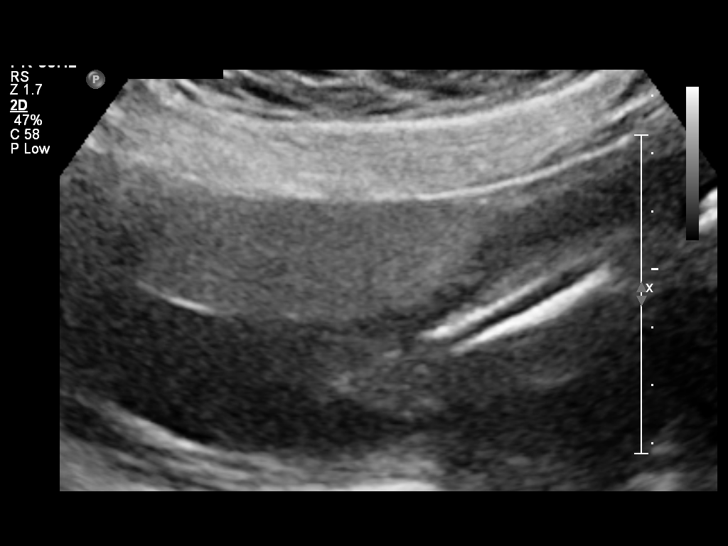
[im 44/71]
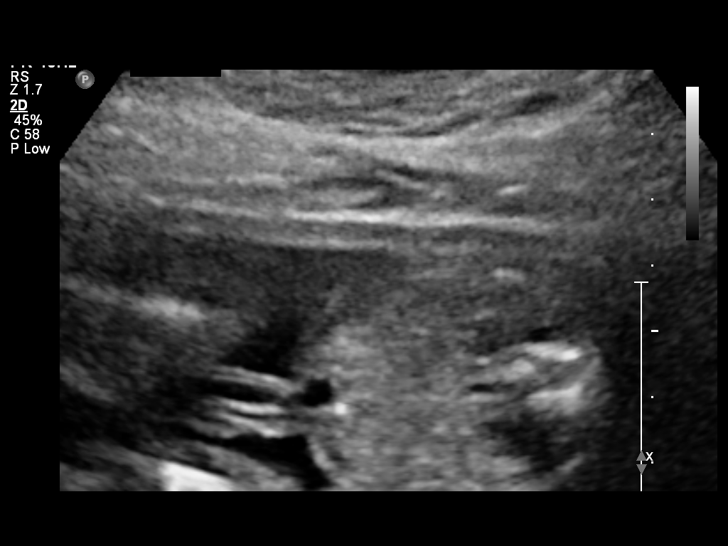
[im 49/71]
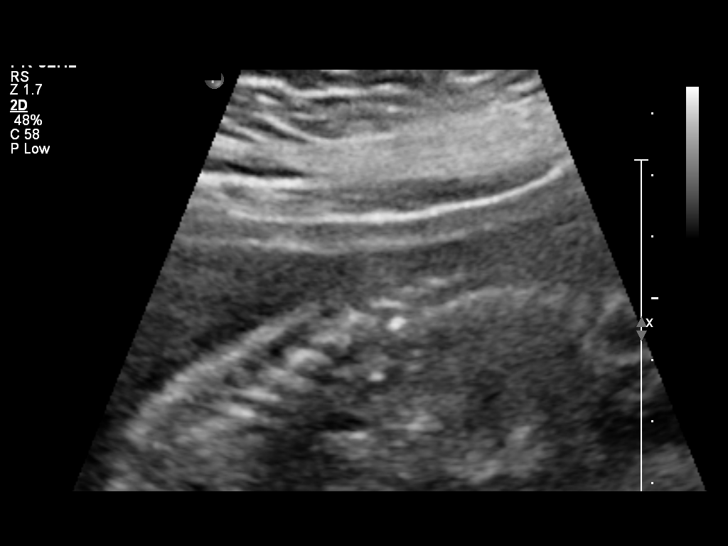
[im 57/71]
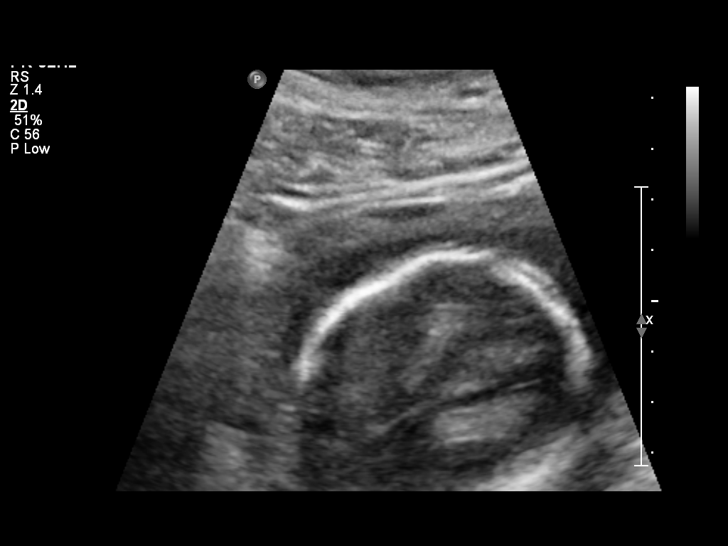
[im 62/71]
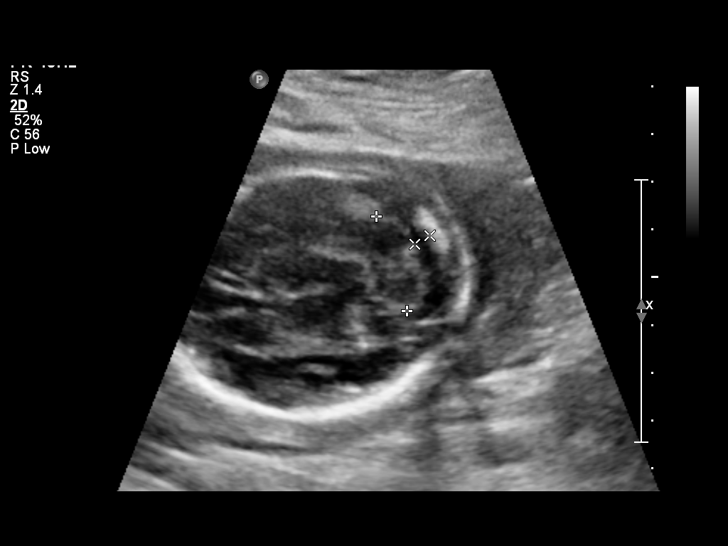
[im 68/71]
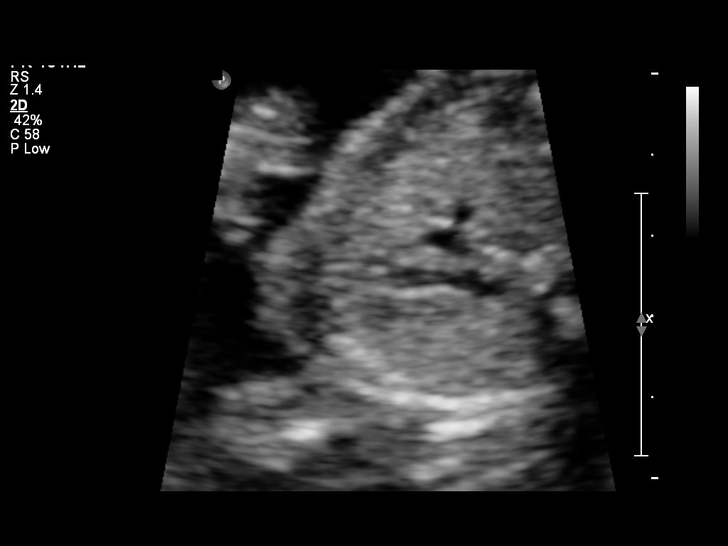

[12 of 28 positions shown; findings below may reference images not displayed]

OBSTETRICS REPORT
                    (Corrected Final [DATE] [DATE])

                 CNM
 Order#:         [PHONE_NUMBER]_O
Procedures

 US OB DETAIL + 14 WK                                  [id number]
Indications

 Detailed fetal anatomic survey                        655.83 [3T]
 Assess Fetal Growth / Estimated Fetal Weight
 Poor obstetrical history (previous ectopic)           [3T]
 No or Little Prenatal Care (late care)                [3T]
Fetal Evaluation

 Preg. Location:    Intrauterine
 Fetal Heart Rate:  150                         bpm
 Cardiac Activity:  Observed
 Presentation:      Cephalic
 Placenta:          Fundal, above cervical os
 P. Cord            Visualized
 Insertion:

 Amniotic Fluid
 AFI FV:      Subjectively within normal limits
                                             Larg Pckt:     3.6  cm
Biometry

 BPD:       51  mm    G. Age:   21w 4d                CI:        75.37   70 - 86
                                                      FL/HC:      18.8   15.9 -

 HC:     186.3  mm    G. Age:   21w 0d       59  %    HC/AC:      1.24   1.06 -

 AC:     149.9  mm    G. Age:   20w 1d       33  %    FL/BPD:
 FL:        35  mm    G. Age:   21w 0d       58  %    FL/AC:      23.3   20 - 24
 HUM:     32.7  mm    G. Age:   21w 0d       61  %

 Est. FW:     369  gm    0 lb 13 oz      49  %
Gestational Age

 LMP:           20w 4d       Date:   [DATE]                 EDD:   [DATE]
 U/S Today:     20w 6d                                        EDD:   [DATE]
 Best:          20w 4d    Det. By:   LMP  ([DATE])          EDD:   [DATE]
Anatomy

 Cranium:           Appears normal      Aortic Arch:       Not well
                                                           visualized
 Fetal Cavum:       Appears normal      Ductal Arch:       Not well
                                                           visualized
 Ventricles:        Appears normal      Diaphragm:         Appears normal
 Choroid Plexus:    Appears normal      Stomach:           Appears normal
 Cerebellum:        Appears normal      Abdomen:           Appears normal
 Posterior Fossa:   Appears normal      Abdominal Wall:    Appears nml
                                                           (cord insert,
                                                           abd wall)
 Nuchal Fold:       Not applicable      Cord Vessels:      Appears normal
                    (>20 wks GA)                           (3 vessel cord)
 Face:              Lips and orbits     Kidneys:           Appear normal
                    appear normal
 Heart:             Appears normal      Bladder:           Appears normal
                    (4 chamber &
                    axis)
 RVOT:              Appears normal      Spine:             Appears normal
 LVOT:              Appears normal      Limbs:             Four extremities
                                                           seen

 Other:     Fetus appears to be a male. Heels and left 5th digit
            visualized.
Cervix Uterus Adnexa

 Cervical Length:   3.4       cm

 Uterus:       No abnormality visualized.
 Cul De Sac:   No free fluid seen.
 Left Ovary:   Size(cm) L: 2.05 x W: 1.3 x H: 1.42  Volume(cc): 2
               Within normal limits.
 Right Ovary:  Size(cm) L: 3.73 x W: 2.07 x H: 2.03  Volume(cc):
               Within normal limits.
 Comment:    Patient has prior ectopic pregnancy and right tube
             removal.
Impression

 Siup demonstrating an EGA by ultrasound of 20w 6d. This
 corresponds well with expected EGA by LMP of 20w 4d.

 No focal fetal or placental abnormalities are noted with a
 good anatomic evaluation possible. No soft markers for Down
 Syndrome are seen. Given the expected age at delivery of
 31, today's normal ultrasound would decrease the age related
 risk for Down Syndrome from [DATE] to [DATE] (SIRQUEIRA et al).
 Correlation with other aneuploidy screening results, if
 available, would be recommended for a more complete risk
 assessment.

 Subjectively and quantitatively normal amniotic fluid volume.
 Normal cervical length.

 questions or concerns.

## 2012-06-25 ENCOUNTER — Encounter (HOSPITAL_COMMUNITY): Payer: Self-pay | Admitting: *Deleted

## 2012-06-25 ENCOUNTER — Inpatient Hospital Stay (HOSPITAL_COMMUNITY)
Admission: AD | Admit: 2012-06-25 | Discharge: 2012-06-25 | Disposition: A | Discharge status: Home / Self Care | Payer: Self-pay | Source: Ambulatory Visit | Attending: Obstetrics and Gynecology | Admitting: Obstetrics and Gynecology

## 2012-06-25 DIAGNOSIS — O0930 Supervision of pregnancy with insufficient antenatal care, unspecified trimester: Secondary | ICD-10-CM

## 2012-06-25 DIAGNOSIS — O0932 Supervision of pregnancy with insufficient antenatal care, second trimester: Secondary | ICD-10-CM

## 2012-06-25 DIAGNOSIS — IMO0002 Reserved for concepts with insufficient information to code with codable children: Secondary | ICD-10-CM | POA: Insufficient documentation

## 2012-06-25 LAB — URINALYSIS, ROUTINE W REFLEX MICROSCOPIC
Nitrite: NEGATIVE
Specific Gravity, Urine: 1.025 (ref 1.005–1.030)
Urobilinogen, UA: 0.2 mg/dL (ref 0.0–1.0)

## 2012-06-25 LAB — CBC
HCT: 37.2 % (ref 36.0–46.0)
MCV: 87.5 fL (ref 78.0–100.0)
RBC: 4.25 MIL/uL (ref 3.87–5.11)
RDW: 13.1 % (ref 11.5–15.5)
WBC: 13 10*3/uL — ABNORMAL HIGH (ref 4.0–10.5)

## 2012-06-25 LAB — RAPID HIV SCREEN (WH-MAU): Rapid HIV Screen: NONREACTIVE

## 2012-06-25 LAB — TYPE AND SCREEN: Antibody Screen: NEGATIVE

## 2012-06-25 LAB — GLUCOSE, CAPILLARY: Glucose-Capillary: 75 mg/dL (ref 70–99)

## 2012-06-25 NOTE — MAU Provider Note (Signed)
History     CSN: [id number]  Arrival date and time: 06/25/12 1526   First Provider Initiated Contact with Patient 06/25/12 1624      Chief Complaint  Patient presents with  . Leg Swelling   HPI Patient is a G2P0010 at 28.5 EGA who presents with complaint of hand and face swelling.  States her hands have had some edema off and on for the past 2 weeks and she noticed some swelling in her face this morning.  She also endorses some headache for the past couple of weeks.  This morning she also noted that she was dizzy with some blurry vision when she stared at one spot for too long.  She denies any sick contacts, fever, diarrhea, or vomiting.  States she has been peeing a lot.  Of notice, she states a few weeks ago she fainted while standing talking to her friend.  States the blood drained out of her face and then her vision went black.  She states she was fine immediately after the event.  She has not had any events like this since then.  OB History    Grav Para Term Preterm Abortions TAB SAB Ect Mult Living   2    1  1          Past Medical History  Diagnosis Date  . No pertinent past medical history     Past Surgical History  Procedure Date  . Shoulder capsulorrhaphy   . Laparscopy     Family History  Problem Relation Age of Onset  . Heart disease Maternal Grandfather     History  Substance Use Topics  . Smoking status: Never Smoker   . Smokeless tobacco: Not on file  . Alcohol Use: No    Allergies:  Allergies  Allergen Reactions  . Tylenol (Acetaminophen) Anaphylaxis  . Aspirin Other (See Comments)    Unknown childhood rxn  . Benadryl (Diphenhydramine Hcl) Itching  . Dilaudid (Hydromorphone Hcl) Itching    Prescriptions prior to admission  Medication Sig Dispense Refill  . calcium carbonate (TUMS - DOSED IN MG ELEMENTAL CALCIUM) 500 MG chewable tablet Chew 4 tablets by mouth 3 (three) times daily as needed. For heartburn      . calcium gluconate 500 MG tablet  Take 500 mg by mouth at bedtime.       . magnesium gluconate (MAGONATE) 500 MG tablet Take 500 mg by mouth 2 (two) times daily.      . Prenatal Vit-Fe Fumarate-FA (PRENATAL MULTIVITAMIN) TABS Take 1 tablet by mouth at bedtime.         ROS negative except per HPI Physical Exam   Blood pressure 121/73, pulse 83, temperature 97.9 F (36.6 C), temperature source Oral, resp. rate 20, height 5\' 4"  (1.626 m), weight 85.73 kg (189 lb), last menstrual period 12/07/2011.  Physical Exam  Constitutional: She is oriented to person, place, and time. She appears well-developed and well-nourished.  HENT:  Head: Normocephalic and atraumatic.  Cardiovascular: Normal rate, regular rhythm and normal heart sounds.   Respiratory: Effort normal and breath sounds normal.  GI: Soft. There is no tenderness.  Musculoskeletal: She exhibits edema (in hands, minimal in lower extremities).  Neurological: She is alert and oriented to person, place, and time.  Psychiatric: She has a normal mood and affect.   Results for orders placed during the hospital encounter of 06/25/12 (from the past 24 hour(s))  URINALYSIS, ROUTINE W REFLEX MICROSCOPIC     Status: Normal   Collection  Time   06/25/12  4:20 PM      Component Value Range   Color, Urine YELLOW  YELLOW   APPearance CLEAR  CLEAR   Specific Gravity, Urine 1.025  1.005 - 1.030   pH 6.0  5.0 - 8.0   Glucose, UA NEGATIVE  NEGATIVE mg/dL   Hgb urine dipstick NEGATIVE  NEGATIVE   Bilirubin Urine NEGATIVE  NEGATIVE   Ketones, ur NEGATIVE  NEGATIVE mg/dL   Protein, ur NEGATIVE  NEGATIVE mg/dL   Urobilinogen, UA 0.2  0.0 - 1.0 mg/dL   Nitrite NEGATIVE  NEGATIVE   Leukocytes, UA NEGATIVE  NEGATIVE  GLUCOSE, CAPILLARY     Status: Normal   Collection Time   06/25/12  4:51 PM      Component Value Range   Glucose-Capillary 75  70 - 99 mg/dL  CBC     Status: Abnormal   Collection Time   06/25/12  4:55 PM      Component Value Range   WBC 13.0 (*) 4.0 - 10.5 K/uL   RBC  4.25  3.87 - 5.11 MIL/uL   Hemoglobin 12.7  12.0 - 15.0 g/dL   HCT 16.1  09.6 - 04.5 %   MCV 87.5  78.0 - 100.0 fL   MCH 29.9  26.0 - 34.0 pg   MCHC 34.1  30.0 - 36.0 g/dL   RDW 40.9  81.1 - 91.4 %   Platelets 205  150 - 400 K/uL  TYPE AND SCREEN     Status: Normal   Collection Time   06/25/12  4:55 PM      Component Value Range   ABO/RH(D) A POS     Antibody Screen NEG     Sample Expiration 06/28/2012      MAU Course  Procedures Assessment and Plan  Patient is a G2P0010 at 28.5 EGA who presents with complaint of swelling of hands and face.  Also with complaint of light headedness.  Swelling likely normal occurrence of pregnancy.  BP is within normal limits, no concern for preeclampsia. Lightheadedness likely due to dehydration given normal CBG and CBC.  Patient advised to drink plenty of water.   [facility]  Was also given the number for [facility] if [facility] turns her down again.  Prenatal labs drawn. Patient was discharged home from the MAU.    Marikay Alar 06/25/2012, 5:20 PM

## 2012-06-25 NOTE — MAU Provider Note (Signed)
Patient seen and examined.  Agree with above note. NST: category 1 tracing.  Appropriate for gestational age. Levie Heritage, DO 06/25/2012 7:34 PM

## 2012-06-25 NOTE — MAU Note (Signed)
Pt presents to MAU with c/o swelling that has been on and off the past few weeks, and woke up this morning with some facial swelling.  Pt also states having some intermittent blurred vision noted with the swelling.  She also says that she has been light-headed and dizzy since 1200 today.

## 2012-06-26 LAB — RPR: RPR Ser Ql: NONREACTIVE

## 2012-11-11 ENCOUNTER — Emergency Department (HOSPITAL_COMMUNITY)
Admission: EM | Admit: 2012-11-11 | Discharge: 2012-11-11 | Disposition: A | Discharge status: Home / Self Care | Payer: Medicaid Other | Attending: Emergency Medicine | Admitting: Emergency Medicine

## 2012-11-11 ENCOUNTER — Encounter (HOSPITAL_COMMUNITY): Payer: Self-pay | Admitting: *Deleted

## 2012-11-11 DIAGNOSIS — Z79899 Other long term (current) drug therapy: Secondary | ICD-10-CM | POA: Insufficient documentation

## 2012-11-11 DIAGNOSIS — E876 Hypokalemia: Secondary | ICD-10-CM

## 2012-11-11 DIAGNOSIS — R112 Nausea with vomiting, unspecified: Secondary | ICD-10-CM

## 2012-11-11 DIAGNOSIS — L272 Dermatitis due to ingested food: Secondary | ICD-10-CM | POA: Insufficient documentation

## 2012-11-11 DIAGNOSIS — R002 Palpitations: Secondary | ICD-10-CM | POA: Insufficient documentation

## 2012-11-11 DIAGNOSIS — E860 Dehydration: Secondary | ICD-10-CM

## 2012-11-11 LAB — CBC WITH DIFFERENTIAL/PLATELET
Basophils Relative: 1 % (ref 0–1)
Eosinophils Absolute: 0.1 10*3/uL (ref 0.0–0.7)
Hemoglobin: 15.8 g/dL — ABNORMAL HIGH (ref 12.0–15.0)
MCH: 30.3 pg (ref 26.0–34.0)
MCHC: 36.5 g/dL — ABNORMAL HIGH (ref 30.0–36.0)
Neutro Abs: 5.8 10*3/uL (ref 1.7–7.7)
Neutrophils Relative %: 64 % (ref 43–77)
Platelets: 247 10*3/uL (ref 150–400)
RBC: 5.22 MIL/uL — ABNORMAL HIGH (ref 3.87–5.11)

## 2012-11-11 LAB — COMPREHENSIVE METABOLIC PANEL
ALT: 24 U/L (ref 0–35)
AST: 22 U/L (ref 0–37)
Albumin: 4.3 g/dL (ref 3.5–5.2)
Alkaline Phosphatase: 88 U/L (ref 39–117)
Chloride: 98 mEq/L (ref 96–112)
Potassium: 2.8 mEq/L — ABNORMAL LOW (ref 3.5–5.1)
Sodium: 135 mEq/L (ref 135–145)
Total Bilirubin: 0.6 mg/dL (ref 0.3–1.2)
Total Protein: 7.4 g/dL (ref 6.0–8.3)

## 2012-11-11 MED ORDER — ONDANSETRON HCL 4 MG/2ML IJ SOLN
4.0000 mg | Freq: Once | INTRAMUSCULAR | Status: AC
  Administered 2012-11-11: 4 mg via INTRAVENOUS
  Filled 2012-11-11: qty 2

## 2012-11-11 MED ORDER — SODIUM CHLORIDE 0.9 % IV BOLUS (SEPSIS)
1000.0000 mL | Freq: Once | INTRAVENOUS | Status: AC
  Administered 2012-11-11: 1000 mL via INTRAVENOUS

## 2012-11-11 MED ORDER — ONDANSETRON HCL 4 MG PO TABS: 4.0000 mg | ORAL_TABLET | Freq: Four times a day (QID) | ORAL | Status: DC | PRN

## 2012-11-11 MED ORDER — SODIUM CHLORIDE 0.9 % IV SOLN
1000.0000 mL | Freq: Once | INTRAVENOUS | Status: AC
  Administered 2012-11-11: 1000 mL via INTRAVENOUS

## 2012-11-11 MED ORDER — SODIUM CHLORIDE 0.9 % IV SOLN
1000.0000 mL | INTRAVENOUS | Status: DC
  Administered 2012-11-11: 1000 mL via INTRAVENOUS

## 2012-11-11 MED ORDER — POTASSIUM CHLORIDE CRYS ER 20 MEQ PO TBCR
40.0000 meq | EXTENDED_RELEASE_TABLET | Freq: Once | ORAL | Status: AC
  Administered 2012-11-11: 40 meq via ORAL
  Filled 2012-11-11: qty 2

## 2012-11-11 MED ORDER — POTASSIUM CHLORIDE CRYS ER 20 MEQ PO TBCR: 20.0000 meq | EXTENDED_RELEASE_TABLET | Freq: Two times a day (BID) | ORAL | Status: DC

## 2012-11-11 NOTE — ED Notes (Signed)
Pt reports taking powdered green tea extract this am, states that she had mixed it with a protein powder and greek yogurt which she's had in the past.  She reports having nausea and palpitations after taking the green tea.

## 2012-11-11 NOTE — ED Notes (Signed)
MD at bedside. I. Knapp EDP 

## 2012-11-11 NOTE — ED Provider Notes (Signed)
History     CSN: [id number]  Arrival date & time 11/11/12  0713   First MD Initiated Contact with Patient 11/11/12 [phone]      Chief Complaint  Patient presents with  . Nausea  . Allergic Reaction  . Palpitations    (Consider location/radiation/quality/duration/timing/severity/associated sxs/prior treatment) HPI Patient is 2 months postpartum. She reports she had a normal pregnancy. She had to have a C-section because her baby was breech. She reports about 540 this morning she added 1 teaspoon of a green tea extract into her usual morning protein shake. About 6:30 this morning she started vomiting and has vomited once on the way to the  ED. Since then she's been having dry heaves. She states she feels like her heart is beating hard and fast, she feels like her vision is blurred and her head feels fuzzy like it's hard to concentrate. She also feels like her balance is off. She denies actual headache. She states her chest feels tight and her upper abdomen feels uncomfortable like she is full. She indicates her abdominal pain is mainly in the epigastric area. She states she feels short of breath. She also reports she's had diarrhea x3. She reports this is the first time she's had the green tea extract.   PCP none  Past Medical History  Diagnosis Date  . No pertinent past medical history     Past Surgical History  Procedure Date  . Shoulder capsulorrhaphy   . Laparscopy     Family History  Problem Relation Age of Onset  . Heart disease Maternal Grandfather     History  Substance Use Topics  . Smoking status: Never Smoker   . Smokeless tobacco: Not on file  . 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    Allergies  Tylenol; Aspirin; Benadryl; and Dilaudid  Home Medications    Current Outpatient Rx  Name  Route  Sig  Dispense  Refill  . CALCIUM GLUCONATE 500 MG PO TABS   Oral   Take 1,000 mg by mouth 2 (two) times daily.          Marland Kitchen FENUGREEK PO   Oral   Take 1,200 mg by mouth 3 (three) times daily.           BP 157/81  Pulse 79  Temp 97.4 F (36.3 C) (Oral)  Resp 20  SpO2 97%  LMP 12/07/2011  Breastfeeding? Unknown  Vital signs normal    Physical Exam  Nursing note and vitals reviewed. Constitutional: She is oriented to person, place, and time. She appears well-developed and well-nourished.  Non-toxic appearance. She does not appear ill. No distress.       Dry heaving during exam  HENT:  Head: Normocephalic and atraumatic.  Right Ear: External ear normal.  Left Ear: External ear normal.  Nose: Nose normal. No mucosal edema or rhinorrhea.  Mouth/Throat: Oropharynx is clear and moist and mucous membranes are normal. No dental abscesses or uvula swelling.  Eyes: Conjunctivae normal and EOM are normal. Pupils are equal, round, and reactive to light.  Neck: Normal range of motion and full passive range of motion without pain. Neck supple.  Cardiovascular: Normal rate, regular rhythm and  normal heart sounds.  Exam reveals no gallop and no friction rub.   No murmur heard. Pulmonary/Chest: Effort normal and breath sounds normal. No respiratory distress. She has no wheezes. She has no rhonchi. She has no rales. She exhibits tenderness. She exhibits no crepitus.         Patient has diffuse anterior chest wall tenderness to palpation  Abdominal: Soft. Normal appearance and bowel sounds are normal. She exhibits no distension. There is tenderness in the epigastric area. There is no rebound and no guarding.    Musculoskeletal: Normal range of motion. She exhibits no edema and no tenderness.       Moves all extremities well.   Neurological: She is alert and oriented to person, place, and time. She has normal strength. No cranial nerve deficit.   Skin: Skin is warm, dry and intact. No rash noted. No erythema. No pallor.  Psychiatric: She has a normal mood and affect. Her speech is normal and behavior is normal. Her mood appears not anxious.    ED Course  Procedures (including critical care time)   Medications  0.9 %  sodium chloride infusion (0 mL Intravenous Stopped 11/11/12 0903)    Followed by  0.9 %  sodium chloride infusion (0 mL Intravenous Stopped 11/11/12 1004)  ondansetron (ZOFRAN) injection 4 mg (4 mg Intravenous Given 11/11/12 0839)  sodium chloride 0.9 % bolus 1,000 mL (1000 mL Intravenous New Bag/Given 11/11/12 1005)  potassium chloride SA (K-DUR,KLOR-CON) CR tablet 40 mEq (40 mEq Oral Given 11/11/12 0940)   Recheck at 10:15 AM patient reports she has started having good urinary output at least 3 times after the IV fluids. She relates she's feeling much improved. She still is mildly pale but she has much better eye contact and looks like she feels better. She states she only drinks distilled water and we discussed drinking sports drinks to keep her from getting dehydrated. She also reports she started working out this week after the birth of her baby. She reports her chest pain is gone. She relates her abdominal pain that was in the epigastric area is now down in her lower abdomen and is much improved. She feels ready to go home at this time.  Results for orders placed during the hospital encounter of 11/11/12  CBC WITH DIFFERENTIAL      Component Value Range   WBC 9.0  4.0 - 10.5 K/uL   RBC 5.22 (*) 3.87 - 5.11 MIL/uL   Hemoglobin 15.8 (*) 12.0 - 15.0 g/dL   HCT 40.9  81.1 - 91.4 %   MCV 83.0  78.0 - 100.0 fL   MCH 30.3  26.0 - 34.0 pg   MCHC 36.5 (*) 30.0 - 36.0 g/dL   RDW 78.2  95.6 - 21.3 %   Platelets 247  150 - 400 K/uL   Neutrophils Relative 64  43 - 77 %   Neutro Abs 5.8  1.7 - 7.7 K/uL   Lymphocytes Relative 29  12 - 46 %   Lymphs Abs 2.6  0.7 - 4.0 K/uL   Monocytes Relative 5  3 - 12 %   Monocytes  Absolute 0.5  0.1 - 1.0 K/uL   Eosinophils Relative 1  0 - 5 %   Eosinophils Absolute 0.1  0.0 - 0.7 K/uL   Basophils Relative 1  0 - 1 %   Basophils Absolute 0.1  0.0 - 0.1 K/uL  COMPREHENSIVE METABOLIC PANEL      Component Value Range  Sodium 135  135 - 145 mEq/L   Potassium 2.8 (*) 3.5 - 5.1 mEq/L   Chloride 98  96 - 112 mEq/L   CO2 24  19 - 32 mEq/L   Glucose, Bld 104 (*) 70 - 99 mg/dL   BUN 17  6 - 23 mg/dL   Creatinine, Ser 1.61  0.50 - 1.10 mg/dL   Calcium 9.6  8.4 - 09.6 mg/dL   Total Protein 7.4  6.0 - 8.3 g/dL   Albumin 4.3  3.5 - 5.2 g/dL   AST 22  0 - 37 U/L   ALT 24  0 - 35 U/L   Alkaline Phosphatase 88  39 - 117 U/L   Total Bilirubin 0.6  0.3 - 1.2 mg/dL   GFR calc non Af Amer 84 (*) >90 mL/min   GFR calc Af Amer >90  >90 mL/min  LIPASE, BLOOD      Component Value Range   Lipase 40  11 - 59 U/L  TROPONIN I      Component Value Range   Troponin I <0.30  <0.30 ng/mL   Laboratory interpretation all normal except concentrated hemoglobin consistent with dehydration, hypokalemia    Date: 11/11/2012  Rate: 77  Rhythm: normal sinus rhythm  QRS Axis: normal  Intervals: normal  ST/T Wave abnormalities: normal  Conduction Disutrbances:none  Narrative Interpretation:   Old EKG Reviewed: none available    1. Nausea and vomiting   2. Dehydration   3. Hypokalemia    New Prescriptions   ONDANSETRON (ZOFRAN) 4 MG TABLET    Take 1 tablet (4 mg total) by mouth every 6 (six) hours as needed for nausea.   POTASSIUM CHLORIDE SA (K-DUR,KLOR-CON) 20 MEQ TABLET    Take 1 tablet (20 mEq total) by mouth 2 (two) times daily.    Plan discharge  Devoria Albe, MD, FACEP    MDM          Ward Givens, MD 11/11/12 [phone]

## 2012-11-11 NOTE — ED Notes (Signed)
Patient states she feels much better-belching a lot-states chest discomfort resolved

## 2012-12-03 ENCOUNTER — Emergency Department (HOSPITAL_COMMUNITY)
Admission: EM | Admit: 2012-12-03 | Discharge: 2012-12-03 | Disposition: A | Discharge status: Home / Self Care | Payer: Medicaid Other | Attending: Emergency Medicine | Admitting: Emergency Medicine

## 2012-12-03 DIAGNOSIS — R51 Headache: Secondary | ICD-10-CM | POA: Insufficient documentation

## 2012-12-03 DIAGNOSIS — R6889 Other general symptoms and signs: Secondary | ICD-10-CM | POA: Insufficient documentation

## 2012-12-03 DIAGNOSIS — J069 Acute upper respiratory infection, unspecified: Secondary | ICD-10-CM | POA: Insufficient documentation

## 2012-12-03 DIAGNOSIS — R059 Cough, unspecified: Secondary | ICD-10-CM | POA: Insufficient documentation

## 2012-12-03 DIAGNOSIS — J329 Chronic sinusitis, unspecified: Secondary | ICD-10-CM

## 2012-12-03 DIAGNOSIS — R05 Cough: Secondary | ICD-10-CM | POA: Insufficient documentation

## 2012-12-03 MED ORDER — AMOXICILLIN 875 MG PO TABS: 875.0000 mg | ORAL_TABLET | Freq: Two times a day (BID) | ORAL | Status: DC

## 2012-12-03 NOTE — ED Notes (Signed)
Pt states she has yellow discharge from nose. This started at  1500. Pt states she doesn't feel well. Pt c/o pressure to sinus area. Pt states she has a headache starting. Pt denies sore throat, cough, fever. Pt with no acute distress.

## 2012-12-03 NOTE — ED Notes (Signed)
Pt ambulatory with steady gait to exam room.  

## 2012-12-03 NOTE — ED Provider Notes (Signed)
History  This chart was scribed for Magnus Sinning, PA-C working with Hurman Horn, MD by Shari Heritage, ED Scribe. This patient was seen in room WTR7/WTR7 and the patient's care was started at 1556.    CSN: [id number]  Arrival date & time 12/03/12  1528   First MD Initiated Contact with Patient 12/03/12 1556      Chief Complaint  Patient presents with  . Sinus Pressure      Patient is a 32 y.o. female presenting with URI. The history is provided by the patient. No language interpreter was used.  URI The primary symptoms include headaches and cough. Primary symptoms do not include fever, sore throat or myalgias. This is a new problem.  The headache began today. The headache developed gradually. Headache is a new problem. The headache is present continuously.  The cough began yesterday. The cough is productive.  Symptoms associated with the illness include congestion and rhinorrhea. The illness is not associated with chills. The following treatments were addressed: Acetaminophen was not tried. A decongestant was not tried. Aspirin was not tried. NSAIDs were not tried.    HPI Comments: Debbie [name] is a 32 y.o. female who presents to the Emergency Department complaining of nasal congestion, frontal headache and frontal sinus pressure onset 1.5 hours ago. Patient's headache is gradually worsening, constant and non-radiating. There is associated nonproductive cough and rhinorrhea. Patient denies postnasal drip, fever, chills, sore throat, or body aches. Patient denies history of recurrent sinus infections. Patient is currently breastfeeding. Patient does not smoke.   Past Medical History  Diagnosis Date  . No pertinent past medical history     Past Surgical History  Procedure Date  . Shoulder capsulorrhaphy   . Laparscopy     Family History  Problem Relation Age of Onset  . Heart disease Maternal Grandfather     History  Substance Use Topics  . Smoking status: Never Smoker    . Smokeless tobacco: Not on file  . Alcohol Use: No    OB History    Grav Para Term Preterm Abortions TAB SAB Ect Mult Living   2    1  1          Review of Systems  Constitutional: Negative for fever and chills.  HENT: Positive for congestion and rhinorrhea. Negative for sore throat.   Respiratory: Positive for cough.   Musculoskeletal: Negative for myalgias.  Neurological: Positive for headaches.  All other systems reviewed and are negative.    Allergies  Tylenol; Aspirin; Benadryl; and Dilaudid  Home Medications   Current Outpatient Rx  Name  Route  Sig  Dispense  Refill  . CALCIUM GLUCONATE 500 MG PO TABS   Oral   Take 1,000 mg by mouth 2 (two) times daily.          Marland Kitchen FENUGREEK PO   Oral   Take 1,200 mg by mouth 3 (three) times daily.         . IBUPROFEN 200 MG PO TABS   Oral   Take 800 mg by mouth every 6 (six) hours as needed.           Triage Vitals: BP 111/74  Pulse 62  Temp 97.9 F (36.6 C) (Oral)  Resp 16  SpO2 100%  LMP 12/07/2011  Breastfeeding? Yes.  Physical Exam  Constitutional: She is oriented to person, place, and time. She appears well-developed and well-nourished.  HENT:  Head: Normocephalic and atraumatic.  Right Ear: Tympanic membrane normal.  Left Ear: Tympanic membrane normal.  Nose: Mucosal edema present. Right sinus exhibits maxillary sinus tenderness and frontal sinus tenderness. Left sinus exhibits maxillary sinus tenderness and frontal sinus tenderness.  Mouth/Throat: Oropharynx is clear and moist. No oropharyngeal exudate or posterior oropharyngeal erythema.       Frontal and maxillary sinuses are tender to palpation.  Eyes: EOM are normal. Pupils are equal, round, and reactive to light.  Neck: Normal range of motion. Neck supple.  Cardiovascular: Normal rate, regular rhythm and normal heart sounds.   Pulmonary/Chest: Effort normal. No respiratory distress. She has no wheezes. She has no rhonchi. She has no rales.        Lungs are clear to auscultation.  Musculoskeletal: Normal range of motion.  Lymphadenopathy:    She has cervical adenopathy.       Anterior cervical adenopathy.  Neurological: She is alert and oriented to person, place, and time.  Skin: Skin is warm and dry.  Psychiatric: She has a normal mood and affect. Her behavior is normal.    ED Course  Procedures (including critical care time) DIAGNOSTIC STUDIES: Oxygen Saturation is 100% on room air, normal by my interpretation.    COORDINATION OF CARE: 4:25 PM- Patient informed of current plan for treatment and evaluation and agrees with plan at this time.      Labs Reviewed - No data to display No results found.   No diagnosis found.    MDM  Signs and symptoms consistent with an antibiotic.  Patient explained that most sinus infections are viral.  Patient given prescription to take if the pain does not improve after one week.  I personally performed the services described in this documentation, which was scribed in my presence. The recorded information has been reviewed and is accurate.    Pascal Lux Doctor Phillips, PA-C 12/04/12 1151

## 2012-12-10 NOTE — ED Provider Notes (Signed)
Medical screening examination/treatment/procedure(s) were performed by non-physician practitioner and as supervising physician I was immediately available for consultation/collaboration.   Hurman Horn, MD 12/10/12 2132

## 2014-09-25 ENCOUNTER — Encounter (HOSPITAL_COMMUNITY): Payer: Self-pay | Admitting: *Deleted

## 2015-03-09 ENCOUNTER — Other Ambulatory Visit (HOSPITAL_COMMUNITY)
Admission: RE | Admit: 2015-03-09 | Discharge: 2015-03-09 | Disposition: A | Discharge status: Home / Self Care | Payer: BC Managed Care – PPO | Source: Ambulatory Visit | Attending: Family Medicine | Admitting: Family Medicine

## 2015-03-09 ENCOUNTER — Other Ambulatory Visit: Payer: Self-pay | Admitting: Family Medicine

## 2015-03-09 DIAGNOSIS — Z124 Encounter for screening for malignant neoplasm of cervix: Secondary | ICD-10-CM | POA: Insufficient documentation

## 2015-03-12 LAB — CYTOLOGY - PAP

## 2015-08-31 ENCOUNTER — Other Ambulatory Visit: Payer: Self-pay | Admitting: Physician Assistant

## 2016-11-26 ENCOUNTER — Ambulatory Visit (HOSPITAL_COMMUNITY)
Admission: EM | Admit: 2016-11-26 | Discharge: 2016-11-26 | Disposition: A | Discharge status: Home / Self Care | Payer: BC Managed Care – PPO | Attending: Emergency Medicine | Admitting: Emergency Medicine

## 2016-11-26 ENCOUNTER — Encounter (HOSPITAL_COMMUNITY): Payer: Self-pay | Admitting: Emergency Medicine

## 2016-11-26 DIAGNOSIS — T148XXA Other injury of unspecified body region, initial encounter: Secondary | ICD-10-CM | POA: Diagnosis not present

## 2016-11-26 MED ORDER — IBUPROFEN 600 MG PO TABS
600.0000 mg | ORAL_TABLET | Freq: Four times a day (QID) | ORAL | 0 refills | Status: DC | PRN
Start: 2016-11-26 — End: 2021-02-25

## 2016-11-26 MED ORDER — KETOROLAC TROMETHAMINE 60 MG/2ML IM SOLN
60.0000 mg | Freq: Once | INTRAMUSCULAR | Status: AC
  Administered 2016-11-26: 60 mg via INTRAMUSCULAR

## 2016-11-26 MED ORDER — CYCLOBENZAPRINE HCL 5 MG PO TABS: 5.0000 mg | ORAL_TABLET | Freq: Three times a day (TID) | ORAL | 0 refills | Status: DC | PRN

## 2016-11-26 MED ORDER — KETOROLAC TROMETHAMINE 60 MG/2ML IM SOLN
INTRAMUSCULAR | Status: AC
  Filled 2016-11-26: qty 2
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
## 2016-11-26 NOTE — ED Notes (Signed)
Assisted as chaperone at bedside for Dr Bridgett Larsson

## 2017-02-13 ENCOUNTER — Encounter: Payer: Self-pay | Admitting: Neurology

## 2017-03-04 ENCOUNTER — Encounter: Payer: Self-pay | Admitting: Neurology

## 2017-03-04 ENCOUNTER — Ambulatory Visit (INDEPENDENT_AMBULATORY_CARE_PROVIDER_SITE_OTHER): Payer: BC Managed Care – PPO | Admitting: Neurology

## 2017-03-04 ENCOUNTER — Encounter (INDEPENDENT_AMBULATORY_CARE_PROVIDER_SITE_OTHER): Payer: Self-pay

## 2017-03-04 VITALS — BP 119/78 | HR 77 | Resp 20 | Ht 64.0 in | Wt 193.0 lb

## 2017-03-04 DIAGNOSIS — G43709 Chronic migraine without aura, not intractable, without status migrainosus: Secondary | ICD-10-CM

## 2017-03-04 MED ORDER — SUMATRIPTAN SUCCINATE 100 MG PO TABS: 100.0000 mg | ORAL_TABLET | Freq: Once | ORAL | 12 refills | Status: DC | PRN

## 2017-03-04 MED ORDER — TOPIRAMATE ER 100 MG PO SPRINKLE CAP24: 100.0000 mg | EXTENDED_RELEASE_CAPSULE | Freq: Every day | ORAL | 11 refills | Status: DC

## 2017-03-04 MED ORDER — ONDANSETRON 4 MG PO TBDP: 4.0000 mg | ORAL_TABLET | Freq: Three times a day (TID) | ORAL | 10 refills | Status: DC | PRN
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
## 2017-03-04 NOTE — Patient Instructions (Addendum)
Remember to drink plenty of fluid, eat healthy meals and do not skip any meals. Try to eat protein with a every meal and eat a healthy snack such as fruit or nuts in between meals. Try to keep a regular sleep-wake schedule and try to exercise daily, particularly in the form of walking, 20-30 minutes a day, if you can.   As far as your medications are concerned, I would like to suggest:  Topiramate ER: start with 25mg  at night for one week, then 50mg  at night for one week, then 75 and then 100mg  at night Imitrex: Please take one tablet at the onset of your headache. If it does not improve the symptoms please take one additional tablet in 2 hours. Do not take more then 2 tablets in 24hrs. Do not take use more then 2 to 3 times in a week. May take Imitrex with Ondansetron and/or ibuprofen  As far as diagnostic testing: MRI of the brain if no improvement  Our phone number is [phone]. We also have an after hours call service for urgent matters and there is a physician on-call for urgent questions. For any emergencies you know to call 911 or go to the nearest emergency room  Sumatriptan tablets What is this medicine? SUMATRIPTAN (soo ma TRIP tan) is used to treat migraines with or without aura. An aura is a strange feeling or visual disturbance that warns you of an attack. It is not used to prevent migraines. This medicine may be used for other purposes; ask your health care provider or pharmacist if you have questions. COMMON BRAND NAME(S): Imitrex, Migraine Pack What should I tell my health care provider before I take this medicine? They need to know if you have any of these conditions: -circulation problems in fingers and toes -diabetes -heart disease -high blood pressure -high cholesterol -history of irregular heartbeat -history of stroke -kidney disease -liver disease -postmenopausal or surgical removal of uterus and ovaries -seizures -smoke tobacco -stomach or intestine  problems -an unusual or allergic reaction to sumatriptan, other medicines, foods, dyes, or preservatives -pregnant or trying to get pregnant -breast-feeding How should I use this medicine? Take this medicine by mouth with a glass of water. Follow the directions on the prescription label. This medicine is taken at the first symptoms of a migraine. It is not for everyday use. If your migraine headache returns after one dose, you can take another dose as directed. You must leave at least 2 hours between doses, and do not take more than 100 mg as a single dose. Do not take more than 200 mg total in any 24 hour period. If there is no improvement at all after the first dose, do not take a second dose without talking to your doctor or health care professional. Do not take your medicine more often than directed. Talk to your pediatrician regarding the use of this medicine in children. Special care may be needed. Overdosage: If you think you have taken too much of this medicine contact a poison control center or emergency room at once. NOTE: This medicine is only for you. Do not share this medicine with others. What if I miss a dose? This does not apply; this medicine is not for regular use. What may interact with this medicine? Do not take this medicine with any of the following medicines: -cocaine -ergot alkaloids like dihydroergotamine, ergonovine, ergotamine, methylergonovine -feverfew -MAOIs like Carbex, Eldepryl, Marplan, Nardil, and Parnate -other medicines for migraine headache like almotriptan, eletriptan, frovatriptan, naratriptan,  rizatriptan, zolmitriptan -tryptophan This medicine may also interact with the following medications: -certain medicines for depression, anxiety, or psychotic disturbances This list may not describe all possible interactions. Give your health care provider a list of all the medicines, herbs, non-prescription drugs, or dietary supplements you use. Also tell them if  you smoke, drink alcohol, or use illegal drugs. Some items may interact with your medicine. What should I watch for while using this medicine? Only take this medicine for a migraine headache. Take it if you get warning symptoms or at the start of a migraine attack. It is not for regular use to prevent migraine attacks. You may get drowsy or dizzy. Do not drive, use machinery, or do anything that needs mental alertness until you know how this medicine affects you. To reduce dizzy or fainting spells, do not sit or stand up quickly, especially if you are an older patient. Alcohol can increase drowsiness, dizziness and flushing. Avoid alcoholic drinks. Smoking cigarettes may increase the risk of heart-related side effects from using this medicine. If you take migraine medicines for 10 or more days a month, your migraines may get worse. Keep a diary of headache days and medicine use. Contact your healthcare professional if your migraine attacks occur more frequently. What side effects may I notice from receiving this medicine? Side effects that you should report to your doctor or health care professional as soon as possible: -allergic reactions like skin rash, itching or hives, swelling of the face, lips, or tongue -bloody or watery diarrhea -hallucination, loss of contact with reality -pain, tingling, numbness in the face, hands, or feet -seizures -signs and symptoms of a blood clot such as breathing problems; changes in vision; chest pain; severe, sudden headache; pain, swelling, warmth in the leg; trouble speaking; sudden numbness or weakness of the face, arm, or leg -signs and symptoms of a dangerous change in heartbeat or heart rhythm like chest pain; dizziness; fast or irregular heartbeat; palpitations, feeling faint or lightheaded; falls; breathing problems -signs and symptoms of a stroke like changes in vision; confusion; trouble speaking or understanding; severe headaches; sudden numbness or  weakness of the face, arm, or leg; trouble walking; dizziness; loss of balance or coordination -stomach pain Side effects that usually do not require medical attention (report to your doctor or health care professional if they continue or are bothersome): -changes in taste -facial flushing -headache -muscle cramps -muscle pain -nausea, vomiting -weak or tired This list may not describe all possible side effects. Call your doctor for medical advice about side effects. You may report side effects to FDA at 1-800-FDA-1088. Where should I keep my medicine? Keep out of the reach of children. Store at room temperature between 2 and 30 degrees C (36 and 86 degrees F). Throw away any unused medicine after the expiration date. NOTE: This sheet is a summary. It may not cover all possible information. If you have questions about this medicine, talk to your doctor, pharmacist, or health care provider.  2018 Elsevier/Gold Standard (2015-12-13 12:38:23)   Ondansetron oral dissolving tablet What is this medicine? ONDANSETRON (on DAN se tron) is used to treat nausea and vomiting caused by chemotherapy. It is also used to prevent or treat nausea and vomiting after surgery. This medicine may be used for other purposes; ask your health care provider or pharmacist if you have questions. COMMON BRAND NAME(S): Zofran ODT What should I tell my health care provider before I take this medicine? They need to know if you have  any of these conditions: -heart disease -history of irregular heartbeat -liver disease -low levels of magnesium or potassium in the blood -an unusual or allergic reaction to ondansetron, granisetron, other medicines, foods, dyes, or preservatives -pregnant or trying to get pregnant -breast-feeding How should I use this medicine? These tablets are made to dissolve in the mouth. Do not try to push the tablet through the foil backing. With dry hands, peel away the foil backing and gently  remove the tablet. Place the tablet in the mouth and allow it to dissolve, then swallow. While you may take these tablets with water, it is not necessary to do so. Talk to your pediatrician regarding the use of this medicine in children. Special care may be needed. Overdosage: If you think you have taken too much of this medicine contact a poison control center or emergency room at once. NOTE: This medicine is only for you. Do not share this medicine with others. What if I miss a dose? If you miss a dose, take it as soon as you can. If it is almost time for your next dose, take only that dose. Do not take double or extra doses. What may interact with this medicine? Do not take this medicine with any of the following medications: -apomorphine -certain medicines for fungal infections like fluconazole, itraconazole, ketoconazole, posaconazole, voriconazole -cisapride -dofetilide -dronedarone -pimozide -thioridazine -ziprasidone This medicine may also interact with the following medications: -carbamazepine -certain medicines for depression, anxiety, or psychotic disturbances -fentanyl -linezolid -MAOIs like Carbex, Eldepryl, Marplan, Nardil, and Parnate -methylene blue (injected into a vein) -other medicines that prolong the QT interval (cause an abnormal heart rhythm) -phenytoin -rifampicin -tramadol This list may not describe all possible interactions. Give your health care provider a list of all the medicines, herbs, non-prescription drugs, or dietary supplements you use. Also tell them if you smoke, drink alcohol, or use illegal drugs. Some items may interact with your medicine. What should I watch for while using this medicine? Check with your doctor or health care professional as soon as you can if you have any sign of an allergic reaction. What side effects may I notice from receiving this medicine? Side effects that you should report to your doctor or health care professional as  soon as possible: -allergic reactions like skin rash, itching or hives, swelling of the face, lips, or tongue -breathing problems -confusion -dizziness -fast or irregular heartbeat -feeling faint or lightheaded, falls -fever and chills -loss of balance or coordination -seizures -sweating -swelling of the hands and feet -tightness in the chest -tremors -unusually weak or tired Side effects that usually do not require medical attention (report to your doctor or health care professional if they continue or are bothersome): -constipation or diarrhea -headache This list may not describe all possible side effects. Call your doctor for medical advice about side effects. You may report side effects to FDA at 1-800-FDA-1088. Where should I keep my medicine? Keep out of the reach of children. Store between 2 and 30 degrees C (36 and 86 degrees F). Throw away any unused medicine after the expiration date. NOTE: This sheet is a summary. It may not cover all possible information. If you have questions about this medicine, talk to your doctor, pharmacist, or health care provider.  2018 Elsevier/Gold Standard (2013-08-17 16:21:52)   Topiramate extended-release capsules What is this medicine? TOPIRAMATE (toe PYRE a mate) is used to treat seizures in adults or children with epilepsy. It is also used for the prevention of migraine  headaches. This medicine may be used for other purposes; ask your health care provider or pharmacist if you have questions. COMMON BRAND NAME(S): Trokendi XR What should I tell my health care provider before I take this medicine? They need to know if you have any of these conditions: -cirrhosis of the liver or liver disease -diarrhea -glaucoma -kidney stones or kidney disease -lung disease like asthma, obstructive pulmonary disease, emphysema -metabolic acidosis -on a ketogenic diet -scheduled for surgery or a procedure -suicidal thoughts, plans, or attempt; a  previous suicide attempt by you or a family member -an unusual or allergic reaction to topiramate, other medicines, foods, dyes, or preservatives -pregnant or trying to get pregnant -breast-feeding How should I use this medicine? Take this medicine by mouth with a glass of water. Follow the directions on the prescription label. Trokendi XR capsules must be swallowed whole. Do not sprinkle on food, break, crush, dissolve, or chew. Qudexy XR capsules may be swallowed whole or opened and sprinkled on a small amount of soft food. This mixture must be swallowed immediately. Do not chew or store mixture for later use. You may take this medicine with meals. Take your medicine at regular intervals. Do not take it more often than directed. Talk to your pediatrician regarding the use of this medicine in children. Special care may be needed. While Trokendi XR may be prescribed for children as young as 6 years and Qudexy XR may be prescribed for children as young as 2 years for selected conditions, precautions do apply. Overdosage: If you think you have taken too much of this medicine contact a poison control center or emergency room at once. NOTE: This medicine is only for you. Do not share this medicine with others. What if I miss a dose? If you miss a dose, take it as soon as you can. If it is almost time for your next dose, take only that dose. Do not take double or extra doses. What may interact with this medicine? Do not take this medicine with any of the following medications: -probenecid This medicine may also interact with the following medications: -acetazolamide -alcohol -amitriptyline -birth control pills -digoxin -hydrochlorothiazide -lithium -medicines for pain, sleep, or muscle relaxation -metformin -methazolamide -other seizure or epilepsy medicines -pioglitazone -risperidone This list may not describe all possible interactions. Give your health care provider a list of all the  medicines, herbs, non-prescription drugs, or dietary supplements you use. Also tell them if you smoke, drink alcohol, or use illegal drugs. Some items may interact with your medicine. What should I watch for while using this medicine? Visit your doctor or health care professional for regular checks on your progress. Do not stop taking this medicine suddenly. This increases the risk of seizures if you are using this medicine to control epilepsy. Wear a medical identification bracelet or chain to say you have epilepsy or seizures, and carry a card that lists all your medicines. This medicine can decrease sweating and increase your body temperature. Watch for signs of deceased sweating or fever, especially in children. Avoid extreme heat, hot baths, and saunas. Be careful about exercising, especially in hot weather. Contact your health care provider right away if you notice a fever or decrease in sweating. You should drink plenty of fluids while taking this medicine. If you have had kidney stones in the past, this will help to reduce your chances of forming kidney stones. If you have stomach pain, with nausea or vomiting and yellowing of your eyes or skin,  call your doctor immediately. You may get drowsy, dizzy, or have blurred vision. Do not drive, use machinery, or do anything that needs mental alertness until you know how this medicine affects you. To reduce dizziness, do not sit or stand up quickly, especially if you are an older patient. Alcohol can increase drowsiness and dizziness. Avoid alcoholic drinks. Do not drink alcohol for 6 hours before or 6 hours after taking Trokendi XR. If you notice blurred vision, eye pain, or other eye problems, seek medical attention at once for an eye exam. The use of this medicine may increase the chance of suicidal thoughts or actions. Pay special attention to how you are responding while on this medicine. Any worsening of mood, or thoughts of suicide or dying should be  reported to your health care professional right away. This medicine may increase the chance of developing metabolic acidosis. If left untreated, this can cause kidney stones, bone disease, or slowed growth in children. Symptoms include breathing fast, fatigue, loss of appetite, irregular heartbeat, or loss of consciousness. Call your doctor immediately if you experience any of these side effects. Also, tell your doctor about any surgery you plan on having while taking this medicine since this may increase your risk for metabolic acidosis. Birth control pills may not work properly while you are taking this medicine. Talk to your doctor about using an extra method of birth control. Women who become pregnant while using this medicine may enroll in the [place] Pregnancy Registry by calling [phone]. This registry collects information about the safety of antiepileptic drug use during pregnancy. What side effects may I notice from receiving this medicine? Side effects that you should report to your doctor or health care professional as soon as possible: -allergic reactions like skin rash, itching or hives, swelling of the face, lips, or tongue -decreased sweating and/or rise in body temperature -depression -difficulty breathing, fast or irregular breathing patterns -difficulty speaking -difficulty walking or controlling muscle movements -hearing impairment -redness, blistering, peeling or loosening of the skin, including inside the mouth -tingling, pain or numbness in the hands or feet -unusually weak or tired -worsening of mood, thoughts or actions of suicide or dying Side effects that usually do not require medical attention (report to your doctor or health care professional if they continue or are bothersome): -altered taste -back pain, joint or muscle aches and pains -diarrhea, or constipation -headache -loss of appetite -nausea -stomach upset,  indigestion -tremors This list may not describe all possible side effects. Call your doctor for medical advice about side effects. You may report side effects to FDA at 1-800-FDA-1088. Where should I keep my medicine? Keep out of the reach of children. Store at room temperature between 15 and 30 degrees C (59 and 86 degrees F) in a tightly closed container. Protect from moisture. Throw away any unused medicine after the expiration date. NOTE: This sheet is a summary. It may not cover all possible information. If you have questions about this medicine, talk to your doctor, pharmacist, or health care provider.  2018 Elsevier/Gold Standard (2016-02-29 12:33:11)

## 2017-03-05 ENCOUNTER — Encounter: Payer: Self-pay | Admitting: Neurology

## 2017-03-05 DIAGNOSIS — G43709 Chronic migraine without aura, not intractable, without status migrainosus: Secondary | ICD-10-CM | POA: Insufficient documentation

## 2017-03-14 ENCOUNTER — Encounter: Payer: Self-pay | Admitting: Neurology

## 2017-03-20 ENCOUNTER — Encounter: Payer: Self-pay | Admitting: Neurology

## 2017-03-20 MED ORDER — CYCLOBENZAPRINE HCL 5 MG PO TABS: 5.0000 mg | ORAL_TABLET | Freq: Three times a day (TID) | ORAL | 11 refills | Status: DC | PRN

## 2017-03-28 ENCOUNTER — Encounter: Payer: Self-pay | Admitting: Neurology

## 2017-03-30 MED ORDER — TOPIRAMATE ER 100 MG PO SPRINKLE CAP24: 100.0000 mg | EXTENDED_RELEASE_CAPSULE | Freq: Every day | ORAL | 11 refills | Status: DC

## 2017-04-04 ENCOUNTER — Encounter: Payer: Self-pay | Admitting: Neurology

## 2017-04-06 ENCOUNTER — Telehealth: Payer: Self-pay | Admitting: Neurology

## 2017-04-06 MED ORDER — TOPIRAMATE ER 100 MG PO SPRINKLE CAP24: 100.0000 mg | EXTENDED_RELEASE_CAPSULE | Freq: Every day | ORAL | 11 refills | Status: DC

## 2017-04-06 NOTE — Telephone Encounter (Signed)
Pt calling to inform that [facility] informed her that they now have the prescription for the Topiramate ER (QUDEXY XR) 100 MG CS24 so there is nothing more that RNJennifer needs to do

## 2017-04-21 ENCOUNTER — Ambulatory Visit: Payer: BC Managed Care – PPO | Admitting: Neurology

## 2017-04-21 ENCOUNTER — Encounter: Payer: Self-pay | Admitting: Neurology

## 2017-04-21 MED ORDER — TOPIRAMATE ER 50 MG PO SPRINKLE CAP24: 50.0000 mg | EXTENDED_RELEASE_CAPSULE | Freq: Every day | ORAL | 11 refills | Status: DC

## 2017-05-05 ENCOUNTER — Encounter: Payer: Self-pay | Admitting: Neurology

## 2017-06-26 ENCOUNTER — Encounter: Payer: Self-pay | Admitting: Neurology

## 2017-06-26 ENCOUNTER — Other Ambulatory Visit: Payer: Self-pay | Admitting: Neurology

## 2017-06-28 ENCOUNTER — Telehealth: Payer: Self-pay | Admitting: Neurology

## 2017-06-28 ENCOUNTER — Other Ambulatory Visit: Payer: Self-pay | Admitting: Neurology

## 2017-06-28 MED ORDER — TOPIRAMATE ER 25 MG PO SPRINKLE CAP24: 25.0000 mg | EXTENDED_RELEASE_CAPSULE | Freq: Every day | ORAL | 11 refills | Status: DC

## 2017-06-28 NOTE — Telephone Encounter (Signed)
Patient needs qudexy 25mg  up front for pick up

## 2017-06-29 NOTE — Telephone Encounter (Signed)
Samples placed up front for pick up (25 mg x 3 qty #30).

## 2017-09-01 ENCOUNTER — Encounter: Payer: Self-pay | Admitting: Neurology

## 2017-09-01 ENCOUNTER — Ambulatory Visit (INDEPENDENT_AMBULATORY_CARE_PROVIDER_SITE_OTHER): Payer: BC Managed Care – PPO | Admitting: Neurology

## 2017-09-01 VITALS — BP 122/78 | HR 96 | Ht 64.0 in | Wt 183.8 lb

## 2017-09-01 DIAGNOSIS — G43709 Chronic migraine without aura, not intractable, without status migrainosus: Secondary | ICD-10-CM

## 2017-09-01 MED ORDER — PROCHLORPERAZINE MALEATE 5 MG PO TABS: 5.0000 mg | ORAL_TABLET | Freq: Four times a day (QID) | ORAL | 11 refills | Status: DC | PRN

## 2017-09-01 MED ORDER — VENLAFAXINE HCL ER 37.5 MG PO CP24: 37.5000 mg | ORAL_CAPSULE | Freq: Every day | ORAL | 6 refills | Status: DC

## 2017-09-01 MED ORDER — SUMATRIPTAN SUCCINATE 6 MG/0.5ML ~~LOC~~ SOLN: 6.0000 mg | SUBCUTANEOUS | 6 refills | Status: DC | PRN
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
## 2017-09-01 NOTE — Patient Instructions (Addendum)
Venlafaxine daily (Effexor) At onset of hedache take Imitrex injection.  Take Compazine for nausea or vomiting may also take it with Relpax at onset of headache [facility] support group on [employer]  Sumatriptan injection What is this medicine? SUMATRIPTAN (soo ma TRIP tan) is used to treat migraines with or without aura. An aura is a strange feeling or visual disturbance that warns you of an attack. It is not used to prevent migraines. This medicine may be used for other purposes; ask your health care provider or pharmacist if you have questions. COMMON BRAND NAME(S): [place]suma, Imitrex, Imitrex Statdose, Sumavel DosePro System What should I tell my health care provider before I take this medicine? They need to know if you have any of these conditions: -circulation problems in fingers and toes -diabetes -heart disease -high blood pressure -high cholesterol -history of irregular heartbeat -history of stroke -kidney disease -liver disease -postmenopausal or surgical removal of uterus and ovaries -seizures -smoke tobacco -stomach or intestine problems -an unusual or allergic reaction to sumatriptan, other medicines, foods, dyes, or preservatives -pregnant or trying to get pregnant -breast-feeding How should I use this medicine? This medicine is for injection under the skin. Follow the directions on the prescription label. Only use this medicine at the first symptoms of a migraine. It is not for everyday use. If you are using an autoinjector, read the instruction leaflet carefully. A single injection is given just under the skin. Before you make an injection, clean and examine your skin. Do not inject at a place where the skin is damaged or infected. If your symptoms return you can use a second injection. If there is no improvement at all in your symptoms after the first injection, call your doctor or health care professional. Wait at least 1 hour between doses and do not use more than 6  mg as a single dose. Do not use more than 12 mg total in any 24 hour period. Do not use your medicine more often than directed. Talk to your pediatrician regarding the use of this medicine in children. Special care may be needed. Overdosage: If you think you have taken too much of this medicine contact a poison control center or emergency room at once. NOTE: This medicine is only for you. Do not share this medicine with others. What if I miss a dose? This does not apply; this medicine is not for regular use. What may interact with this medicine? Do not take this medicine with any of the following medicines: -cocaine -ergot alkaloids like dihydroergotamine, ergonovine, ergotamine, methylergonovine -feverfew -MAOIs like Carbex, Eldepryl, Marplan, Nardil, and Parnate -other medicines for migraine headache like almotriptan, eletriptan, frovatriptan, naratriptan, rizatriptan, zolmitriptan -tryptophan This medicine may also interact with the following medications: -certain medicines for depression, anxiety, or psychotic disturbances This list may not describe all possible interactions. Give your health care provider a list of all the medicines, herbs, non-prescription drugs, or dietary supplements you use. Also tell them if you smoke, drink alcohol, or use illegal drugs. Some items may interact with your medicine. What should I watch for while using this medicine? Only take this medicine for a migraine headache. Take it if you get warning symptoms or at the start of a migraine attack. It is not for regular use to prevent migraine attacks. You may get drowsy or dizzy. Do not drive, use machinery, or do anything that needs mental alertness until you know how this medicine affects you. Do not stand or sit up quickly, especially if  you are an older patient. This reduces the risk of dizzy or fainting spells. Alcohol may interfere with the effect of this medicine. Avoid alcoholic drinks. Smoking cigarettes  may increase the risk of heart-related side effects from using this medicine. If you take migraine medicines for 10 or more days a month, your migraines may get worse. Keep a diary of headache days and medicine use. Contact your healthcare professional if your migraine attacks occur more frequently. What side effects may I notice from receiving this medicine? Side effects that you should report to your doctor or health care professional as soon as possible: -allergic reactions like skin rash, itching or hives, swelling of the face, lips, or tongue -bloody or watery diarrhea -hallucination, loss of contact with reality -pain, tingling, numbness in the face, hands, or feet -seizures -signs and symptoms of a blood clot such as breathing problems; changes in vision; chest pain; severe, sudden headache; pain, swelling, warmth in the leg; trouble speaking; sudden numbness or weakness of the face, arm, or leg -signs and symptoms of a dangerous change in heartbeat or heart rhythm like chest pain; dizziness; fast or irregular heartbeat; palpitations, feeling faint or lightheaded; falls; breathing problems -signs and symptoms of a stroke like changes in vision; confusion; trouble speaking or understanding; severe headaches; sudden numbness or weakness of the face, arm, or leg; trouble walking; dizziness; loss of balance or coordination -stomach pain Side effects that usually do not require medical attention (report to your doctor or health care professional if they continue or are bothersome): -changes in taste -facial flushing -headache -muscle cramps -muscle pain -nausea, vomiting -pain, redness, or irritation at site where injected -weak or tired This list may not describe all possible side effects. Call your doctor for medical advice about side effects. You may report side effects to FDA at 1-800-FDA-1088. Where should I keep my medicine? Keep out of the reach of children. Store at room  temperature between 2 and 30 degrees C (36 and 86 degrees F). Protect from light. Throw away any unused medicine after the expiration date. Make sure you receive a puncture-resistant container to dispose of the needles and syringes once you have finished with them. Do not reuse these items. Return the container to your health care professional for proper disposal. Keep the autoinjector device. NOTE: This sheet is a summary. It may not cover all possible information. If you have questions about this medicine, talk to your doctor, pharmacist, or health care provider.  2018 Elsevier/Gold Standard (2015-12-13 12:41:27)    Venlafaxine extended-release capsules What is this medicine? VENLAFAXINE(VEN la fax een) is used to treat depression, anxiety and panic disorder. Also used for migraines and headaches.  This medicine may be used for other purposes; ask your health care provider or pharmacist if you have questions. COMMON BRAND NAME(S): Effexor XR What should I tell my health care provider before I take this medicine? They need to know if you have any of these conditions: -bleeding disorders -glaucoma -heart disease -high blood pressure -high cholesterol -kidney disease -liver disease -low levels of sodium in the blood -mania or bipolar disorder -seizures -suicidal thoughts, plans, or attempt; a previous suicide attempt by you or a family -take medicines that treat or prevent blood clots -thyroid disease -an unusual or allergic reaction to venlafaxine, desvenlafaxine, other medicines, foods, dyes, or preservatives -pregnant or trying to get pregnant -breast-feeding How should I use this medicine? Take this medicine by mouth with a full glass of water. Follow the directions  on the prescription label. Do not cut, crush, or chew this medicine. Take it with food. If needed, the capsule may be carefully opened and the entire contents sprinkled on a spoonful of cool applesauce. Swallow the  applesauce/pellet mixture right away without chewing and follow with a glass of water to ensure complete swallowing of the pellets. Try to take your medicine at about the same time each day. Do not take your medicine more often than directed. Do not stop taking this medicine suddenly except upon the advice of your doctor. Stopping this medicine too quickly may cause serious side effects or your condition may worsen. A special MedGuide will be given to you by the pharmacist with each prescription and refill. Be sure to read this information carefully each time. Talk to your pediatrician regarding the use of this medicine in children. Special care may be needed. Overdosage: If you think you have taken too much of this medicine contact a poison control center or emergency room at once. NOTE: This medicine is only for you. Do not share this medicine with others. What if I miss a dose? If you miss a dose, take it as soon as you can. If it is almost time for your next dose, take only that dose. Do not take double or extra doses. What may interact with this medicine? Do not take this medicine with any of the following medications: -certain medicines for fungal infections like fluconazole, itraconazole, ketoconazole, posaconazole, voriconazole -cisapride -desvenlafaxine -dofetilide -dronedarone -duloxetine -levomilnacipran -linezolid -MAOIs like Carbex, Eldepryl, Marplan, Nardil, and Parnate -methylene blue (injected into a vein) -milnacipran -pimozide -thioridazine -ziprasidone This medicine may also interact with the following medications: -amphetamines -aspirin and aspirin-like medicines -certain medicines for depression, anxiety, or psychotic disturbances -certain medicines for migraine headaches like almotriptan, eletriptan, frovatriptan, naratriptan, rizatriptan, sumatriptan, zolmitriptan -certain medicines for sleep -certain medicines that treat or prevent blood clots like dalteparin,  enoxaparin, warfarin -cimetidine -clozapine -diuretics -fentanyl -furazolidone -indinavir -isoniazid -lithium -metoprolol -NSAIDS, medicines for pain and inflammation, like ibuprofen or naproxen -other medicines that prolong the QT interval (cause an abnormal heart rhythm) -procarbazine -rasagiline -supplements like St. John's wort, kava kava, valerian -tramadol -tryptophan This list may not describe all possible interactions. Give your health care provider a list of all the medicines, herbs, non-prescription drugs, or dietary supplements you use. Also tell them if you smoke, drink alcohol, or use illegal drugs. Some items may interact with your medicine. What should I watch for while using this medicine? Tell your doctor if your symptoms do not get better or if they get worse. Visit your doctor or health care professional for regular checks on your progress. Because it may take several weeks to see the full effects of this medicine, it is important to continue your treatment as prescribed by your doctor. Patients and their families should watch out for new or worsening thoughts of suicide or depression. Also watch out for sudden changes in feelings such as feeling anxious, agitated, panicky, irritable, hostile, aggressive, impulsive, severely restless, overly excited and hyperactive, or not being able to sleep. If this happens, especially at the beginning of treatment or after a change in dose, call your health care professional. This medicine can cause an increase in blood pressure. Check with your doctor for instructions on monitoring your blood pressure while taking this medicine. You may get drowsy or dizzy. Do not drive, use machinery, or do anything that needs mental alertness until you know how this medicine affects you. Do not stand  or sit up quickly, especially if you are an older patient. This reduces the risk of dizzy or fainting spells. Alcohol may interfere with the effect of this  medicine. Avoid alcoholic drinks. Your mouth may get dry. Chewing sugarless gum, sucking hard candy and drinking plenty of water will help. Contact your doctor if the problem does not go away or is severe. What side effects may I notice from receiving this medicine? Side effects that you should report to your doctor or health care professional as soon as possible: -allergic reactions like skin rash, itching or hives, swelling of the face, lips, or tongue -anxious -breathing problems -confusion -changes in vision -chest pain -confusion -elevated mood, decreased need for sleep, racing thoughts, impulsive behavior -eye pain -fast, irregular heartbeat -feeling faint or lightheaded, falls -feeling agitated, angry, or irritable -hallucination, loss of contact with reality -high blood pressure -loss of balance or coordination -palpitations -redness, blistering, peeling or loosening of the skin, including inside the mouth -restlessness, pacing, inability to keep still -seizures -stiff muscles -suicidal thoughts or other mood changes -trouble passing urine or change in the amount of urine -trouble sleeping -unusual bleeding or bruising -unusually weak or tired -vomiting Side effects that usually do not require medical attention (report to your doctor or health care professional if they continue or are bothersome): -change in sex drive or performance -change in appetite or weight -constipation -dizziness -dry mouth -headache -increased sweating -nausea -tired This list may not describe all possible side effects. Call your doctor for medical advice about side effects. You may report side effects to FDA at 1-800-FDA-1088. Where should I keep my medicine? Keep out of the reach of children. Store at a controlled temperature between 20 and 25 degrees C (68 degrees and 77 degrees F), in a dry place. Throw away any unused medicine after the expiration date. NOTE: This sheet is a summary.  It may not cover all possible information. If you have questions about this medicine, talk to your doctor, pharmacist, or health care provider.  2018 Elsevier/Gold Standard (2016-04-10 18:38:02)

## 2017-09-02 ENCOUNTER — Encounter: Payer: Self-pay | Admitting: Neurology

## 2017-09-07 ENCOUNTER — Encounter: Payer: Self-pay | Admitting: Neurology

## 2017-09-10 ENCOUNTER — Encounter: Payer: Self-pay | Admitting: Neurology

## 2017-09-10 ENCOUNTER — Other Ambulatory Visit: Payer: Self-pay | Admitting: Neurology

## 2017-09-23 ENCOUNTER — Encounter: Payer: Self-pay | Admitting: Neurology

## 2017-09-24 ENCOUNTER — Other Ambulatory Visit: Payer: Self-pay | Admitting: Neurology

## 2017-09-24 DIAGNOSIS — G43709 Chronic migraine without aura, not intractable, without status migrainosus: Secondary | ICD-10-CM

## 2017-09-24 MED ORDER — VENLAFAXINE HCL ER 75 MG PO CP24
75.0000 mg | ORAL_CAPSULE | Freq: Every day | ORAL | 11 refills | Status: DC
Start: 2017-09-24 — End: 2017-11-25

## 2017-10-24 ENCOUNTER — Encounter: Payer: Self-pay | Admitting: Neurology

## 2017-10-27 ENCOUNTER — Other Ambulatory Visit: Payer: Self-pay | Admitting: Neurology

## 2017-10-27 MED ORDER — FREMANEZUMAB-VFRM 225 MG/1.5ML ~~LOC~~ SOSY: 225.0000 mg | PREFILLED_SYRINGE | SUBCUTANEOUS | 11 refills | Status: DC

## 2017-11-09 ENCOUNTER — Encounter: Payer: Self-pay | Admitting: Neurology

## 2017-11-23 ENCOUNTER — Encounter: Payer: Self-pay | Admitting: Neurology

## 2017-11-25 ENCOUNTER — Telehealth: Payer: Self-pay | Admitting: *Deleted

## 2017-11-25 ENCOUNTER — Other Ambulatory Visit: Payer: Self-pay | Admitting: Neurology

## 2017-11-25 DIAGNOSIS — G43709 Chronic migraine without aura, not intractable, without status migrainosus: Secondary | ICD-10-CM

## 2017-11-25 MED ORDER — FREMANEZUMAB-VFRM 225 MG/1.5ML ~~LOC~~ SOSY: 225.0000 mg | PREFILLED_SYRINGE | SUBCUTANEOUS | 11 refills | Status: DC

## 2017-11-25 MED ORDER — VENLAFAXINE HCL ER 75 MG PO CP24: 75.0000 mg | ORAL_CAPSULE | Freq: Every day | ORAL | 3 refills | Status: DC

## 2017-11-25 MED ORDER — SUMATRIPTAN SUCCINATE 6 MG/0.5ML ~~LOC~~ SOLN: 6.0000 mg | SUBCUTANEOUS | 6 refills | Status: DC | PRN

## 2017-11-25 NOTE — Telephone Encounter (Signed)
Refill request received from [facility] for Sumatriptan and Venlafaxine. Prescriptions sent to [facility] and 90 day supply of Venlafaxine authorized.

## 2017-12-02 ENCOUNTER — Encounter: Payer: Self-pay | Admitting: Neurology

## 2017-12-10 ENCOUNTER — Ambulatory Visit: Payer: BC Managed Care – PPO | Admitting: Neurology

## 2017-12-10 ENCOUNTER — Encounter: Payer: Self-pay | Admitting: Neurology

## 2017-12-10 VITALS — BP 117/83 | HR 80 | Ht 64.0 in | Wt 189.0 lb

## 2017-12-10 DIAGNOSIS — F901 Attention-deficit hyperactivity disorder, predominantly hyperactive type: Secondary | ICD-10-CM

## 2017-12-10 DIAGNOSIS — F909 Attention-deficit hyperactivity disorder, unspecified type: Secondary | ICD-10-CM

## 2017-12-10 DIAGNOSIS — F900 Attention-deficit hyperactivity disorder, predominantly inattentive type: Secondary | ICD-10-CM | POA: Insufficient documentation

## 2017-12-10 DIAGNOSIS — G4700 Insomnia, unspecified: Secondary | ICD-10-CM

## 2017-12-10 DIAGNOSIS — G43709 Chronic migraine without aura, not intractable, without status migrainosus: Secondary | ICD-10-CM

## 2017-12-10 MED ORDER — NORTRIPTYLINE HCL 25 MG PO CAPS: 25.0000 mg | ORAL_CAPSULE | Freq: Every day | ORAL | 11 refills | Status: DC

## 2017-12-10 MED ORDER — AMPHETAMINE-DEXTROAMPHETAMINE 10 MG PO TABS: 10.0000 mg | ORAL_TABLET | Freq: Two times a day (BID) | ORAL | 0 refills | Status: DC

## 2017-12-10 NOTE — Patient Instructions (Addendum)
Amphetamine; Dextroamphetamine tablets What is this medicine? AMPHETAMINE; DEXTROAMPHETAMINE(am FET a meen; dex troe am FET a meen) is used to treat attention-deficit hyperactivity disorder (ADHD). It may also be used for narcolepsy. Federal law prohibits giving this medicine to any person other than the person for whom it was prescribed. Do not share this medicine with anyone else. This medicine may be used for other purposes; ask your health care provider or pharmacist if you have questions. COMMON BRAND NAME(S): Adderall What should I tell my health care provider before I take this medicine? They need to know if you have any of these conditions: -anxiety or panic attacks -circulation problems in fingers and toes -glaucoma -hardening or blockages of the arteries or heart blood vessels -heart disease or a heart defect -high blood pressure -history of a drug or alcohol abuse problem -history of stroke -kidney disease -liver disease -mental illness -seizures -suicidal thoughts, plans, or attempt; a previous suicide attempt by you or a family member -thyroid disease -Tourette's syndrome -an unusual or allergic reaction to dextroamphetamine, other amphetamines, other medicines, foods, dyes, or preservatives -pregnant or trying to get pregnant -breast-feeding How should I use this medicine? Take this medicine by mouth with a glass of water. Follow the directions on the prescription label. Take your doses at regular intervals. Do not take your medicine more often than directed. Do not suddenly stop your medicine. You must gradually reduce the dose or you may feel withdrawal effects. Ask your doctor or health care professional for advice. Talk to your pediatrician regarding the use of this medicine in children. Special care may be needed. While this drug may be prescribed for children as young as 3 years for selected conditions, precautions do apply. Overdosage: If you think you have taken too  much of this medicine contact a poison control center or emergency room at once. NOTE: This medicine is only for you. Do not share this medicine with others. What if I miss a dose? If you miss a dose, take it as soon as you can. If it is almost time for your next dose, take only that dose. Do not take double or extra doses. What may interact with this medicine? Do not take this medicine with any of the following medications: -MAOIS like Carbex, Eldepryl, Marplan, Nardil, and Parnate -other stimulant medicines for attention disorders, weight loss, or to stay awake This medicine may also interact with the following medications: -acetazolamide -ammonium chloride -antacids -ascorbic acid -atomoxetine -caffeine -certain medicines for blood pressure -certain medicines for depression, anxiety, or psychotic disturbances -certain medicines for seizures like carbamazepine, phenobarbital, phenytoin -certain medicines for stomach problems like cimetidine, famotidine, omeprazole, lansoprazole -cold or allergy medicines -glutamic acid -lithium -meperidine -methenamine; sodium acid phosphate -narcotic medicines for pain -norepinephrine -phenothiazines like chlorpromazine, mesoridazine, prochlorperazine, thioridazine -sodium acid phosphate -sodium bicarbonate This list may not describe all possible interactions. Give your health care provider a list of all the medicines, herbs, non-prescription drugs, or dietary supplements you use. Also tell them if you smoke, drink alcohol, or use illegal drugs. Some items may interact with your medicine. What should I watch for while using this medicine? Visit your doctor or health care professional for regular checks on your progress. This prescription requires that you follow special procedures with your doctor and pharmacy. You will need to have a new written prescription from your doctor every time you need a refill. This medicine may affect your  concentration, or hide signs of tiredness. Until you know how this   medicine affects you, do not drive, ride a bicycle, use machinery, or do anything that needs mental alertness. Tell your doctor or health care professional if this medicine loses its effects, or if you feel you need to take more than the prescribed amount. Do not change the dosage without talking to your doctor or health care professional. Decreased appetite is a common side effect when starting this medicine. Eating small, frequent meals or snacks can help. Talk to your doctor if you continue to have poor eating habits. Height and weight growth of a child taking this medicine will be monitored closely. Do not take this medicine close to bedtime. It may prevent you from sleeping. If you are going to need surgery, a MRI, CT scan, or other procedure, tell your doctor that you are taking this medicine. You may need to stop taking this medicine before the procedure. Tell your doctor or healthcare professional right away if you notice unexplained wounds on your fingers and toes while taking this medicine. You should also tell your healthcare provider if you experience numbness or pain, changes in the skin color, or sensitivity to temperature in your fingers or toes. What side effects may I notice from receiving this medicine? Side effects that you should report to your doctor or health care professional as soon as possible: -allergic reactions like skin rash, itching or hives, swelling of the face, lips, or tongue -changes in vision -chest pain or chest tightness -confusion, trouble speaking or understanding -fast, irregular heartbeat -fingers or toes feel numb, cool, painful -hallucination, loss of contact with reality -high blood pressure -males: prolonged or painful erection -seizures -severe headaches -shortness of breath -suicidal thoughts or other mood changes -trouble walking, dizziness, loss of balance or  coordination -uncontrollable head, mouth, neck, arm, or leg movements Side effects that usually do not require medical attention (report to your doctor or health care professional if they continue or are bothersome): -anxious -headache -loss of appetite -nausea, vomiting -trouble sleeping -weight loss This list may not describe all possible side effects. Call your doctor for medical advice about side effects. You may report side effects to FDA at 1-800-FDA-1088. Where should I keep my medicine? Keep out of the reach of children. This medicine can be abused. Keep your medicine in a safe place to protect it from theft. Do not share this medicine with anyone. Selling or giving away this medicine is dangerous and against the law. Store at room temperature between 15 and 30 degrees C (59 and 86 degrees F). Keep container tightly closed. Throw away any unused medicine after the expiration date. Dispose of properly. This medicine may cause accidental overdose and death if it is taken by other adults, children, or pets. Mix any unused medicine with a substance like cat litter or coffee grounds. Then throw the medicine away in a sealed container like a sealed bag or a coffee can with a lid. Do not use the medicine after the expiration date. NOTE: This sheet is a summary. It may not cover all possible information. If you have questions about this medicine, talk to your doctor, pharmacist, or health care provider.  2018 [employer] Standard (2014-09-13 18:44:41)  Nortriptyline capsules What is this medicine? NORTRIPTYLINE (nor TRIP ti leen) is used to treat depression. This medicine may be used for other purposes; ask your health care provider or pharmacist if you have questions. COMMON BRAND NAME(S): Aventyl, Pamelor What should I tell my health care provider before I take this medicine? They need  to know if you have any of these conditions: -an alcohol problem -bipolar disorder or  schizophrenia -difficulty passing urine, prostate trouble -glaucoma -heart disease or recent heart attack -liver disease -over active thyroid -seizures -thoughts or plans of suicide or a previous suicide attempt or family history of suicide attempt -an unusual or allergic reaction to nortriptyline, other medicines, foods, dyes, or preservatives -pregnant or trying to get pregnant -breast-feeding How should I use this medicine? Take this medicine by mouth with a glass of water. Follow the directions on the prescription label. Take your doses at regular intervals. Do not take it more often than directed. Do not stop taking this medicine suddenly except upon the advice of your doctor. Stopping this medicine too quickly may cause serious side effects or your condition may worsen. A special MedGuide will be given to you by the pharmacist with each prescription and refill. Be sure to read this information carefully each time. Talk to your pediatrician regarding the use of this medicine in children. Special care may be needed. Overdosage: If you think you have taken too much of this medicine contact a poison control center or emergency room at once. NOTE: This medicine is only for you. Do not share this medicine with others. What if I miss a dose? If you miss a dose, take it as soon as you can. If it is almost time for your next dose, take only that dose. Do not take double or extra doses. What may interact with this medicine? Do not take this medicine with any of the following medications: -arsenic trioxide -certain medicines medicines for irregular heart beat -cisapride -halofantrine -linezolid -MAOIs like Carbex, Eldepryl, Marplan, Nardil, and Parnate -methylene blue (injected into a vein) -other medicines for mental depression -phenothiazines like perphenazine, thioridazine and chlorpromazine -pimozide -probucol -procarbazine -sparfloxacin -St. John's Wort -ziprasidone This medicine  may also interact with any of the following medications: -atropine and related drugs like hyoscyamine, scopolamine, tolterodine and others -barbiturate medicines for inducing sleep or treating seizures, such as phenobarbital -cimetidine -medicines for diabetes -medicines for seizures like carbamazepine or phenytoin -reserpine -thyroid medicine This list may not describe all possible interactions. Give your health care provider a list of all the medicines, herbs, non-prescription drugs, or dietary supplements you use. Also tell them if you smoke, drink alcohol, or use illegal drugs. Some items may interact with your medicine. What should I watch for while using this medicine? Tell your doctor if your symptoms do not get better or if they get worse. Visit your doctor or health care professional for regular checks on your progress. Because it may take several weeks to see the full effects of this medicine, it is important to continue your treatment as prescribed by your doctor. Patients and their families should watch out for new or worsening thoughts of suicide or depression. Also watch out for sudden changes in feelings such as feeling anxious, agitated, panicky, irritable, hostile, aggressive, impulsive, severely restless, overly excited and hyperactive, or not being able to sleep. If this happens, especially at the beginning of treatment or after a change in dose, call your health care professional. Dennis Bast may get drowsy or dizzy. Do not drive, use machinery, or do anything that needs mental alertness until you know how this medicine affects you. Do not stand or sit up quickly, especially if you are an older patient. This reduces the risk of dizzy or fainting spells. Alcohol may interfere with the effect of this medicine. Avoid alcoholic drinks. Do not  treat yourself for coughs, colds, or allergies without asking your doctor or health care professional for advice. Some ingredients can increase possible  side effects. Your mouth may get dry. Chewing sugarless gum or sucking hard candy, and drinking plenty of water may help. Contact your doctor if the problem does not go away or is severe. This medicine may cause dry eyes and blurred vision. If you wear contact lenses you may feel some discomfort. Lubricating drops may help. See your eye doctor if the problem does not go away or is severe. This medicine can cause constipation. Try to have a bowel movement at least every 2 to 3 days. If you do not have a bowel movement for 3 days, call your doctor or health care professional. This medicine can make you more sensitive to the sun. Keep out of the sun. If you cannot avoid being in the sun, wear protective clothing and use sunscreen. Do not use sun lamps or tanning beds/booths. What side effects may I notice from receiving this medicine? Side effects that you should report to your doctor or health care professional as soon as possible: -allergic reactions like skin rash, itching or hives, swelling of the face, lips, or tongue -anxious -breathing problems -changes in vision -confusion -elevated mood, decreased need for sleep, racing thoughts, impulsive behavior -eye pain -fast, irregular heartbeat -feeling faint or lightheaded, falls -feeling agitated, angry, or irritable -fever with increased sweating -hallucination, loss of contact with reality -seizures -stiff muscles -suicidal thoughts or other mood changes -tingling, pain, or numbness in the feet or hands -trouble passing urine or change in the amount of urine -trouble sleeping -unusually weak or tired -vomiting -yellowing of the eyes or skin Side effects that usually do not require medical attention (report to your doctor or health care professional if they continue or are bothersome): -change in sex drive or performance -change in appetite or weight -constipation -dizziness -dry mouth -nausea -tired -tremors -upset stomach This  list may not describe all possible side effects. Call your doctor for medical advice about side effects. You may report side effects to FDA at 1-800-FDA-1088. Where should I keep my medicine? Keep out of the reach of children. Store at room temperature between 15 and 30 degrees C (59 and 86 degrees F). Keep container tightly closed. Throw away any unused medicine after the expiration date. NOTE: This sheet is a summary. It may not cover all possible information. If you have questions about this medicine, talk to your doctor, pharmacist, or health care provider.  2018 [employer] Standard (2016-04-11 12:53:08)
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
## 2017-12-15 ENCOUNTER — Telehealth: Payer: Self-pay

## 2017-12-15 NOTE — Telephone Encounter (Signed)
We received a prior authorization request for this medication. I have completed and submitted the PA on Cover My Meds and should have a determination within 48-72 hours.  Cover My Meds Key: [id number]

## 2017-12-15 NOTE — Telephone Encounter (Signed)
[facility] received a request from your provider for coverage of Adderall 10MG  OR TABS. The request was denied because: Current plan approved Amphetamines criteria does not allow coverage of Adderall if the patient's diasnosis has not been appropriately documented (i.e., evaluated by a complete clinical assessment, using DSM-5, standardized rating scales, interviews/questionnaires).

## 2017-12-16 NOTE — Telephone Encounter (Signed)
Spoke with Morey Hummingbird @ [facility]. She will fax [place] a PA to look over and complete for second attempt at Adderall 10 mg BID approval.

## 2017-12-17 NOTE — Telephone Encounter (Signed)
They often do not approve this medication, she will have to pay our of pocket if she wants it thanks

## 2017-12-17 NOTE — Telephone Encounter (Signed)
Spoke with patient and discussed insurance denial of Adderall 10 mg. She plans to pay out of pocket & will use goodrx coupon at [employer]. [facility] and they confirmed that they can give the paper prescription copy to the patient. Patient aware and will pickup prescription.

## 2017-12-17 NOTE — Telephone Encounter (Signed)
[employer] returned call from yesterday and stated that the diagnosis would need to be properly documented (I.e. Evaluated by a complete clinical assessment, using DSM-5, standardized rating scales, interviews/questionaires). Patient's report of symptoms/history would not be sufficient.  Received copy of the paper PA which includes the same question regarding documentation.

## 2017-12-30 ENCOUNTER — Encounter: Payer: Self-pay | Admitting: Neurology

## 2018-01-03 ENCOUNTER — Telehealth: Payer: Self-pay | Admitting: Neurology

## 2018-01-03 ENCOUNTER — Other Ambulatory Visit: Payer: Self-pay | Admitting: Neurology

## 2018-01-03 MED ORDER — TOPIRAMATE ER 25 MG PO SPRINKLE CAP24: 25.0000 mg | EXTENDED_RELEASE_CAPSULE | Freq: Every day | ORAL | 11 refills | Status: DC

## 2018-01-03 NOTE — Telephone Encounter (Signed)
Debbie [name], patient is coming in tomorrow to pick up a [place] card please leave it at the front. Also leave a note with it that [facility] [address]. thanks

## 2018-01-04 ENCOUNTER — Other Ambulatory Visit: Payer: Self-pay | Admitting: *Deleted

## 2018-01-04 DIAGNOSIS — G43709 Chronic migraine without aura, not intractable, without status migrainosus: Secondary | ICD-10-CM

## 2018-01-04 MED ORDER — NORTRIPTYLINE HCL 25 MG PO CAPS: 25.0000 mg | ORAL_CAPSULE | Freq: Every day | ORAL | 3 refills | Status: DC

## 2018-01-04 NOTE — Telephone Encounter (Signed)
Done, placed at front.

## 2018-01-16 ENCOUNTER — Encounter: Payer: Self-pay | Admitting: Neurology

## 2018-01-18 ENCOUNTER — Encounter: Payer: Self-pay | Admitting: Neurology

## 2018-02-01 ENCOUNTER — Other Ambulatory Visit: Payer: Self-pay | Admitting: Neurology

## 2018-02-01 MED ORDER — AMPHETAMINE-DEXTROAMPHETAMINE 10 MG PO TABS: 10.0000 mg | ORAL_TABLET | Freq: Two times a day (BID) | ORAL | 0 refills | Status: DC

## 2018-02-05 ENCOUNTER — Other Ambulatory Visit: Payer: Self-pay | Admitting: Neurology

## 2018-02-24 ENCOUNTER — Encounter: Payer: Self-pay | Admitting: Neurology

## 2018-03-03 ENCOUNTER — Telehealth: Payer: Self-pay | Admitting: *Deleted

## 2018-03-03 NOTE — Telephone Encounter (Signed)
Botox consent form started per v.o. Dr. Jaynee Eagles.

## 2018-03-29 ENCOUNTER — Encounter: Payer: Self-pay | Admitting: Neurology

## 2018-03-30 ENCOUNTER — Telehealth: Payer: Self-pay | Admitting: Neurology

## 2018-03-30 NOTE — Telephone Encounter (Signed)
Pt called to schedule a sooner appt per email but there is not an available appt time sooner than what is already scheduled. Please call her to advise

## 2018-04-11 ENCOUNTER — Encounter: Payer: Self-pay | Admitting: Neurology

## 2018-04-13 MED ORDER — AMPHETAMINE-DEXTROAMPHETAMINE 10 MG PO TABS: 10.0000 mg | ORAL_TABLET | Freq: Two times a day (BID) | ORAL | 0 refills | Status: DC

## 2018-04-21 ENCOUNTER — Ambulatory Visit: Payer: BC Managed Care – PPO | Admitting: Neurology

## 2018-04-21 ENCOUNTER — Encounter: Payer: Self-pay | Admitting: Neurology

## 2018-04-21 ENCOUNTER — Other Ambulatory Visit: Payer: Self-pay | Admitting: Neurology

## 2018-04-21 VITALS — BP 118/79 | HR 103 | Ht 64.0 in | Wt 196.0 lb

## 2018-04-21 DIAGNOSIS — G43709 Chronic migraine without aura, not intractable, without status migrainosus: Secondary | ICD-10-CM | POA: Diagnosis not present

## 2018-04-21 DIAGNOSIS — S134XXD Sprain of ligaments of cervical spine, subsequent encounter: Secondary | ICD-10-CM

## 2018-04-21 DIAGNOSIS — M7918 Myalgia, other site: Secondary | ICD-10-CM

## 2018-04-21 DIAGNOSIS — M542 Cervicalgia: Secondary | ICD-10-CM

## 2018-04-21 MED ORDER — METOCLOPRAMIDE HCL 10 MG PO TABS
10.0000 mg | ORAL_TABLET | Freq: Four times a day (QID) | ORAL | 11 refills | Status: DC
Start: 2018-04-21 — End: 2019-04-25

## 2018-04-21 MED ORDER — ERENUMAB-AOOE 140 MG/ML ~~LOC~~ SOAJ: 140.0000 mg | SUBCUTANEOUS | 11 refills | Status: DC

## 2018-04-21 MED ORDER — NORTRIPTYLINE HCL 25 MG PO CAPS: 25.0000 mg | ORAL_CAPSULE | Freq: Two times a day (BID) | ORAL | 5 refills | Status: DC

## 2018-04-21 MED ORDER — AMPHETAMINE-DEXTROAMPHETAMINE 10 MG PO TABS: 10.0000 mg | ORAL_TABLET | Freq: Two times a day (BID) | ORAL | 0 refills | Status: DC
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
## 2018-04-21 NOTE — Patient Instructions (Addendum)
Increase Nortriptyline Dry Needling Stop Ajovy start Aimovig

## 2018-04-22 ENCOUNTER — Telehealth: Payer: Self-pay | Admitting: Neurology

## 2018-04-22 NOTE — Telephone Encounter (Signed)
PA approved from 04/22/18-04/22/21. PA # [facility] health plan [phone] JL.

## 2018-04-22 NOTE — Telephone Encounter (Signed)
PA completed for Aderall through [employer] on cover my meds. KEY: [id number] Should get a response within 24 hours.

## 2018-05-04 ENCOUNTER — Encounter: Payer: Self-pay | Admitting: Physical Therapy

## 2018-05-04 ENCOUNTER — Other Ambulatory Visit: Payer: Self-pay

## 2018-05-04 ENCOUNTER — Ambulatory Visit: Payer: BC Managed Care – PPO | Attending: Neurology | Admitting: Physical Therapy

## 2018-05-04 DIAGNOSIS — M542 Cervicalgia: Secondary | ICD-10-CM | POA: Insufficient documentation

## 2018-05-04 DIAGNOSIS — M62838 Other muscle spasm: Secondary | ICD-10-CM | POA: Diagnosis present

## 2018-05-04 DIAGNOSIS — R293 Abnormal posture: Secondary | ICD-10-CM | POA: Diagnosis present
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
## 2018-05-17 ENCOUNTER — Ambulatory Visit: Payer: BC Managed Care – PPO | Admitting: Physical Therapy

## 2018-05-19 ENCOUNTER — Ambulatory Visit: Payer: BC Managed Care – PPO | Admitting: Physical Therapy

## 2018-05-19 DIAGNOSIS — M62838 Other muscle spasm: Secondary | ICD-10-CM

## 2018-05-19 DIAGNOSIS — R293 Abnormal posture: Secondary | ICD-10-CM

## 2018-05-19 DIAGNOSIS — M542 Cervicalgia: Secondary | ICD-10-CM

## 2018-05-19 NOTE — Therapy (Addendum)
[place], [place], [address] Phone: [phone]   Fax:  [phone]  Physical Therapy Treatment  Patient Details  Name: Debbie [name] MRN: [id number] Date of Birth: 09/13/1981 Referring Provider: Sarina Ill MD   Encounter Date: 05/19/2018  PT End of Session - 05/19/18 1503    Visit Number  2    Number of Visits  13    Date for PT Re-Evaluation  06/15/18    PT Start Time  1400    PT Stop Time  1450    PT Time Calculation (min)  50 min    Activity Tolerance  Patient tolerated treatment well;Patient limited by pain    Behavior During Therapy  [facility] for tasks assessed/performed       Past Medical History:  Diagnosis Date  . Anxiety    self reported  . Depression    self reported  . Migraine   . No pertinent past medical history     Past Surgical History:  Procedure Laterality Date  . CESAREAN SECTION  2013  . laparscopy    . SHOULDER CAPSULORRHAPHY      There were no vitals filed for this visit.  Subjective Assessment - 05/19/18 1357    Subjective  pt reports she is feeling okay. Dry needling worked well for her last time. She had a migraine the next day.     Currently in Pain?  Yes    Pain Score  3     Pain Location  Neck    Pain Orientation  Right;Left    Pain Descriptors / Indicators  Dull    Pain Type  Chronic pain    Pain Radiating Towards  numbness and tingling into L shoulder when she laterally flexes head    Pain Onset  More than a month ago    Pain Frequency  Intermittent    Pain Relieving Factors  stretching, ice, muscle relaxers (doesn't take often).         [facility] PT Assessment - 05/19/18 0001      Special Tests    Special Tests  Thoracic Outlet Syndrome    Thoracic Outlet Syndrome   Adson Test      Adson Test   Findings  Positive    Side   Left    Comment  reproduction of symptoms down LUE                   [facility] Adult PT Treatment/Exercise - 05/19/18  1458      Modalities   Modalities  Cryotherapy for headache during TPDN      Manual Therapy   Manual Therapy  Joint mobilization;Soft tissue mobilization    Manual therapy comments  skilled palpation and monitoring pt throughout TPDN    Joint Mobilization  First rib mob grade 3 and distal clavicular mob grade 3     Soft tissue mobilization  Rolling of SCM and upper trapezieus, MTPr of subclavius       Trigger Point Dry Needling - 05/19/18 1456    Consent Given?  Yes    Education Handout Provided  No    Muscles Treated Upper Body  Sternocleidomastoid;Upper trapezius;Pectoralis major scalenes    Sternocleidomastoid Response  Twitch response elicited    Upper Trapezius Response  Twitch reponse elicited    Pectoralis Major Response  Twitch response elicited           PT Education - 05/19/18 1501    Education Details  TPDN, muscle referral patterns    Person(s) Educated  Patient    Methods  Explanation    Comprehension  Verbalized understanding       PT Short Term Goals - 05/04/18 1124      PT SHORT TERM GOAL #1   Title  pt to be I with inital HEP    Time  3    Period  Weeks    Status  New    Target Date  05/25/18      PT SHORT TERM GOAL #2   Title  pt to verbalize/ demo proper posture in sitting/ standing to reduce abnormal muscle tension and reduce neck pain/ spasm    Time  3    Period  Weeks    Status  New    Target Date  05/25/18      PT SHORT TERM GOAL #3   Title  pt to reduce upper trap and surrounding muscle spasm to reduce pain to </= 5/10 and promote cervical mobility     Time  3    Period  Weeks    Status  New    Target Date  05/25/18        PT Long Term Goals - 05/04/18 1128      PT LONG TERM GOAL #1   Title  pt to increase cervical flexion to >/= 30 degrees and extension to >/= 50 degrees,  bil sidbending to >/= 45 degrees, and improve L rotation to >/= 60 degrees for functional mobility for safety with driving with </= 7/10 pain    Time  6     Period  Weeks    Status  New    Target Date  06/15/18      PT LONG TERM GOAL #2   Title  pt to be able to sit, stand and walk >/= 60 min with </= 2/10 pain for functional endurance required for school related activities utilizing proper posture     Time  6    Period  Weeks    Status  New    Target Date  06/15/18      PT LONG TERM GOAL #3   Title  pt to report being able to go >/= 2 weeks without having a HA to promote pt personal goal and improve QOL     Time  6    Period  Weeks    Status  New    Target Date  06/15/18      PT LONG TERM GOAL #4   Title  increase FOTO score to </= 42% limited to demo improvement in function    Time  6    Period  Weeks    Status  New    Target Date  06/15/18      PT LONG TERM GOAL #5   Title  pt to be I with all HEP given as of last visit to maintain and progress current level of function     Time  6    Period  Weeks    Status  New    Target Date  06/15/18            Plan - 05/19/18 1504    Clinical Impression Statement  Pt reports minimal pain today upon arrival. She reports some numbness/tingling into L arm when laterally flexing her head. Adson's test performed to assess for thoracic outlet syndrome; pt reports increased tightness and tingling in arm. Treatment focused on TPDN of scalenes, SCM,  pec major, and upper trap. TPDN of L upper trap provoked pt's headache. Reassessed Adson's test following TPDN and manual therapy and pt reports improvement. She reports decreased pain following treatment.      PT Treatment/Interventions  ADLs/Self Care Home Management;Cryotherapy;Electrical Stimulation;Iontophoresis 4mg /ml Dexamethasone;Moist Heat;Traction;Ultrasound;Therapeutic activities;Therapeutic exercise;Manual techniques;Patient/family education;Passive range of motion;Dry needling;Taping;Vasopneumatic Device    PT Next Visit Plan  review/ update HEP, how was DN, thoracic mobility, cervical stability/ flexors, thoracic mobility, Modalities PRN     PT Home Exercise Plan  chin tuck head lift, upper trap, rib mobs, upper cervical rotation    Consulted and Agree with Plan of Care  Patient       Patient will benefit from skilled therapeutic intervention in order to improve the following deficits and impairments:  Pain, Improper body mechanics, Postural dysfunction, Increased fascial restricitons, Increased muscle spasms, Decreased activity tolerance, Decreased endurance  Visit Diagnosis: Cervicalgia  Other muscle spasm  Abnormal posture     Problem List Patient Active Problem List   Diagnosis Date Noted  . ADHD 12/10/2017  . Insomnia 12/10/2017  . Chronic migraine without aura without status migrainosus, not intractable 03/05/2017   Worthy Flank, SPT 05/19/18 4:49 PM   [facility] [address] [place], [place], [address] Phone: [phone]   Fax:  [phone]  Name: Runell Kovich MRN: [id number] Date of Birth: 1981/09/10

## 2018-05-25 ENCOUNTER — Ambulatory Visit: Payer: BC Managed Care – PPO | Attending: Neurology | Admitting: Physical Therapy

## 2018-05-25 DIAGNOSIS — M542 Cervicalgia: Secondary | ICD-10-CM | POA: Diagnosis not present

## 2018-05-25 DIAGNOSIS — R293 Abnormal posture: Secondary | ICD-10-CM | POA: Diagnosis present

## 2018-05-25 DIAGNOSIS — M62838 Other muscle spasm: Secondary | ICD-10-CM

## 2018-05-25 NOTE — Therapy (Signed)
[place], [place], [address] Phone: [phone]   Fax:  [phone]  Physical Therapy Treatment  Patient Details  Name: Debbie [name] MRN: [id number] Date of Birth: Sep 21, 1981 Referring Provider: Sarina Ill MD   Encounter Date: 05/25/2018  PT End of Session - 05/25/18 1515    Visit Number  3    Number of Visits  13    Date for PT Re-Evaluation  06/15/18    PT Start Time  1500    PT Stop Time  1546    PT Time Calculation (min)  46 min    Activity Tolerance  Patient tolerated treatment well    Behavior During Therapy  [facility] for tasks assessed/performed       Past Medical History:  Diagnosis Date  . Anxiety    self reported  . Depression    self reported  . Migraine   . No pertinent past medical history     Past Surgical History:  Procedure Laterality Date  . CESAREAN SECTION  2013  . laparscopy    . SHOULDER CAPSULORRHAPHY      There were no vitals filed for this visit.  Subjective Assessment - 05/25/18 1459    Subjective  "I am feeling okay today. I felt like the dry needling helped after the muscles released about a day later."    Pain Score  5     Pain Location  Neck    Pain Orientation  Right;Left    Pain Type  Chronic pain    Pain Onset  More than a month ago    Pain Frequency  Intermittent                       [facility] Adult PT Treatment/Exercise - 05/25/18 0001      Neck Exercises: Seated   Other Seated Exercise  scap retractions 2 x 15      Neck Exercises: Supine   Neck Retraction  -- x 8 reps. with head lift.       Neck Exercises: Prone   Shoulder Extension  10 reps      Shoulder Exercises: Supine   Protraction  AROM;Strengthening;Both;10 reps 2 sets. tactile cues for correct technique      Manual Therapy   Manual Therapy  Joint mobilization    Manual therapy comments  skilled palpation and monitoring pt throughout TPDN    Joint Mobilization  grade 2-3  C3-C7 and T1-T10 PA mobs        Trigger Point Dry Needling - 05/25/18 1512    Consent Given?  Yes    Education Handout Provided  No    Muscles Treated Upper Body  Upper trapezius;Suboccipitals muscle group;Levator scapulae cervical paraspinals - twitch response elicited    Upper Trapezius Response  Twitch reponse elicited    SubOccipitals Response  Twitch response elicited    Levator Scapulae Response  Twitch response elicited           PT Education - 05/25/18 1555    Education Details  TPDN, postural education, update HEP    Person(s) Educated  Patient    Methods  Explanation;Handout    Comprehension  Verbalized understanding       PT Short Term Goals - 05/04/18 1124      PT SHORT TERM GOAL #1   Title  pt to be I with inital HEP    Time  3    Period  Weeks  Status  New    Target Date  05/25/18      PT SHORT TERM GOAL #2   Title  pt to verbalize/ demo proper posture in sitting/ standing to reduce abnormal muscle tension and reduce neck pain/ spasm    Time  3    Period  Weeks    Status  New    Target Date  05/25/18      PT SHORT TERM GOAL #3   Title  pt to reduce upper trap and surrounding muscle spasm to reduce pain to </= 5/10 and promote cervical mobility     Time  3    Period  Weeks    Status  New    Target Date  05/25/18        PT Long Term Goals - 05/04/18 1128      PT LONG TERM GOAL #1   Title  pt to increase cervical flexion to >/= 30 degrees and extension to >/= 50 degrees,  bil sidbending to >/= 45 degrees, and improve L rotation to >/= 60 degrees for functional mobility for safety with driving with </= 7/82 pain    Time  6    Period  Weeks    Status  New    Target Date  06/15/18      PT LONG TERM GOAL #2   Title  pt to be able to sit, stand and walk >/= 60 min with </= 2/10 pain for functional endurance required for school related activities utilizing proper posture     Time  6    Period  Weeks    Status  New    Target Date  06/15/18       PT LONG TERM GOAL #3   Title  pt to report being able to go >/= 2 weeks without having a HA to promote pt personal goal and improve QOL     Time  6    Period  Weeks    Status  New    Target Date  06/15/18      PT LONG TERM GOAL #4   Title  increase FOTO score to </= 42% limited to demo improvement in function    Time  6    Period  Weeks    Status  New    Target Date  06/15/18      PT LONG TERM GOAL #5   Title  pt to be I with all HEP given as of last visit to maintain and progress current level of function     Time  6    Period  Weeks    Status  New    Target Date  06/15/18            Plan - 05/25/18 1550    Clinical Impression Statement  Pt reports dry needling provided relief from last week until just the last day or so. Treatment today focused on TPDN of suboccipitals, cervical paraspinals, upper trap, and levator scap. Also focused on scapular strengthening and postural training.     PT Treatment/Interventions  ADLs/Self Care Home Management;Cryotherapy;Electrical Stimulation;Iontophoresis 4mg /ml Dexamethasone;Moist Heat;Traction;Ultrasound;Therapeutic activities;Therapeutic exercise;Manual techniques;Patient/family education;Passive range of motion;Dry needling;Taping;Vasopneumatic Device    PT Next Visit Plan  review/ update HEP, how was DN, thoracic mobility, cervical stability/ flexors, Modalities PRN, teach tennis ball suboccipital release, theracane MTPr, postural training    PT Home Exercise Plan  chin tuck head lift, upper trap, rib mobs, upper cervical rotation; serratus punches, scap retractions  Consulted and Agree with Plan of Care  Patient       Patient will benefit from skilled therapeutic intervention in order to improve the following deficits and impairments:  Pain, Improper body mechanics, Postural dysfunction, Increased fascial restricitons, Increased muscle spasms, Decreased activity tolerance, Decreased endurance  Visit  Diagnosis: Cervicalgia  Other muscle spasm  Abnormal posture     Problem List Patient Active Problem List   Diagnosis Date Noted  . ADHD 12/10/2017  . Insomnia 12/10/2017  . Chronic migraine without aura without status migrainosus, not intractable 03/05/2017   Worthy Flank, SPT 05/25/18 3:59 PM   [facility] [address] [place], [place], [address] Phone: [phone]   Fax:  [phone]  Name: Debbie [name] MRN: [id number] Date of Birth: 1981-01-30

## 2018-06-01 ENCOUNTER — Ambulatory Visit: Payer: BC Managed Care – PPO | Admitting: Physical Therapy

## 2018-06-02 ENCOUNTER — Ambulatory Visit: Payer: BC Managed Care – PPO | Admitting: Physical Therapy

## 2018-06-02 ENCOUNTER — Encounter: Payer: Self-pay | Admitting: Physical Therapy

## 2018-06-02 ENCOUNTER — Other Ambulatory Visit: Payer: Self-pay | Admitting: Neurology

## 2018-06-02 ENCOUNTER — Encounter: Payer: Self-pay | Admitting: Neurology

## 2018-06-02 DIAGNOSIS — M62838 Other muscle spasm: Secondary | ICD-10-CM

## 2018-06-02 DIAGNOSIS — M542 Cervicalgia: Secondary | ICD-10-CM | POA: Diagnosis not present

## 2018-06-02 DIAGNOSIS — R293 Abnormal posture: Secondary | ICD-10-CM

## 2018-06-02 MED ORDER — AMPHETAMINE-DEXTROAMPHETAMINE 10 MG PO TABS: 10.0000 mg | ORAL_TABLET | Freq: Two times a day (BID) | ORAL | 0 refills | Status: DC

## 2018-06-02 NOTE — Therapy (Signed)
[place], [place], [address] Phone: [phone]   Fax:  [phone]  Physical Therapy Treatment  Patient Details  Name: Debbie [name] MRN: [id number] Date of Birth: 1981/08/25 Referring Provider: Sarina Ill MD   Encounter Date: 06/02/2018  PT End of Session - 06/02/18 1651    Visit Number  4    Number of Visits  13    Date for PT Re-Evaluation  06/15/18    PT Start Time  1500    PT Stop Time  1542    PT Time Calculation (min)  42 min    Activity Tolerance  Patient tolerated treatment well    Behavior During Therapy  [facility] for tasks assessed/performed       Past Medical History:  Diagnosis Date  . Anxiety    self reported  . Depression    self reported  . Migraine   . No pertinent past medical history     Past Surgical History:  Procedure Laterality Date  . CESAREAN SECTION  2013  . laparscopy    . SHOULDER CAPSULORRHAPHY      There were no vitals filed for this visit.  Subjective Assessment - 06/02/18 1504    Subjective  Patient feels like her neck is improving. She is still having some pain on her right side. She has pain when she turns her head to the right but it has improved. She feels like the needling has helped.     Limitations  Sitting;Reading;Lifting;Standing;Writing;Walking;House hold activities    How long can you sit comfortably?  60 min    How long can you stand comfortably?  60 min    How long can you walk comfortably?  20 min    Patient Stated Goals  "I want the bulge to go away and decrease the tension in the muscle in my neck. Increase mobility in neck."    Currently in Pain?  Yes    Pain Score  5     Pain Location  Neck    Pain Orientation  Right    Pain Onset  More than a month ago    Pain Frequency  Intermittent    Aggravating Factors   driving, turning her head to the right     Pain Relieving Factors  stretching, ice     Effect of Pain on Daily Activities  limited  endurance                        [facility] Adult PT Treatment/Exercise - 06/02/18 0001      Manual Therapy   Manual Therapy  Joint mobilization;Manual Traction    Manual therapy comments  skilled palpation and monitoring pt throughout TPDN    Joint Mobilization  grade 2-3 C3-C7 and T1-T10 PA mobs     Soft tissue mobilization  IASTYM to bilateral upper traps and bilateral cervical paraspinals     Manual Traction  sub occipital release; genetle manual traction        Trigger Point Dry Needling - 06/02/18 1658    Upper Trapezius Response  Twitch reponse elicited    SubOccipitals Response  Twitch response elicited cervical     Levator Scapulae Response  Twitch response elicited           PT Education - 06/02/18 1651    Education Details  TPDN benefits and risks; use of IASTYM     Person(s) Educated  Patient    Methods  Explanation;Demonstration;Tactile cues;Verbal cues    Comprehension  Verbalized understanding;Returned demonstration;Verbal cues required;Tactile cues required       PT Short Term Goals - 05/04/18 1124      PT SHORT TERM GOAL #1   Title  pt to be I with inital HEP    Time  3    Period  Weeks    Status  New    Target Date  05/25/18      PT SHORT TERM GOAL #2   Title  pt to verbalize/ demo proper posture in sitting/ standing to reduce abnormal muscle tension and reduce neck pain/ spasm    Time  3    Period  Weeks    Status  New    Target Date  05/25/18      PT SHORT TERM GOAL #3   Title  pt to reduce upper trap and surrounding muscle spasm to reduce pain to </= 5/10 and promote cervical mobility     Time  3    Period  Weeks    Status  New    Target Date  05/25/18        PT Long Term Goals - 05/04/18 1128      PT LONG TERM GOAL #1   Title  pt to increase cervical flexion to >/= 30 degrees and extension to >/= 50 degrees,  bil sidbending to >/= 45 degrees, and improve L rotation to >/= 60 degrees for functional mobility for safety with  driving with </= 1/09 pain    Time  6    Period  Weeks    Status  New    Target Date  06/15/18      PT LONG TERM GOAL #2   Title  pt to be able to sit, stand and walk >/= 60 min with </= 2/10 pain for functional endurance required for school related activities utilizing proper posture     Time  6    Period  Weeks    Status  New    Target Date  06/15/18      PT LONG TERM GOAL #3   Title  pt to report being able to go >/= 2 weeks without having a HA to promote pt personal goal and improve QOL     Time  6    Period  Weeks    Status  New    Target Date  06/15/18      PT LONG TERM GOAL #4   Title  increase FOTO score to </= 42% limited to demo improvement in function    Time  6    Period  Weeks    Status  New    Target Date  06/15/18      PT LONG TERM GOAL #5   Title  pt to be I with all HEP given as of last visit to maintain and progress current level of function     Time  6    Period  Weeks    Status  New    Target Date  06/15/18            Plan - 06/02/18 1653    Clinical Impression Statement  Patient had a great twitch response in his paraspinals and upper trap. Therapy reviewed with the patient the importance of stretching and exercises.     Clinical Presentation  Stable    Clinical Decision Making  Low    Rehab Potential  Good    PT Frequency  2x / week    PT Duration  6 weeks    PT Treatment/Interventions  ADLs/Self Care Home Management;Cryotherapy;Electrical Stimulation;Iontophoresis 4mg /ml Dexamethasone;Moist Heat;Traction;Ultrasound;Therapeutic activities;Therapeutic exercise;Manual techniques;Patient/family education;Passive range of motion;Dry needling;Taping;Vasopneumatic Device    PT Next Visit Plan  review/ update HEP, how was DN, thoracic mobility, cervical stability/ flexors, Modalities PRN, teach tennis ball suboccipital release, theracane MTPr, postural training    PT Home Exercise Plan  chin tuck head lift, upper trap, rib mobs, upper cervical  rotation; serratus punches, scap retractions    Consulted and Agree with Plan of Care  Patient       Patient will benefit from skilled therapeutic intervention in order to improve the following deficits and impairments:  Pain, Improper body mechanics, Postural dysfunction, Increased fascial restricitons, Increased muscle spasms, Decreased activity tolerance, Decreased endurance  Visit Diagnosis: Cervicalgia  Other muscle spasm  Abnormal posture     Problem List Patient Active Problem List   Diagnosis Date Noted  . ADHD 12/10/2017  . Insomnia 12/10/2017  . Chronic migraine without aura without status migrainosus, not intractable 03/05/2017    Carney Living PT DPT  06/02/2018, 5:00 PM  [place], [place], [address] Phone: [phone]   Fax:  [phone]  Name: Debbie [name] MRN: [id number] Date of Birth: May 23, 1981

## 2018-06-03 ENCOUNTER — Other Ambulatory Visit: Payer: Self-pay | Admitting: Neurology

## 2018-06-03 MED ORDER — CYCLOBENZAPRINE HCL 5 MG PO TABS: 5.0000 mg | ORAL_TABLET | Freq: Three times a day (TID) | ORAL | 11 refills | Status: DC | PRN

## 2018-06-08 ENCOUNTER — Ambulatory Visit: Payer: BC Managed Care – PPO | Admitting: Physical Therapy

## 2018-06-08 DIAGNOSIS — M542 Cervicalgia: Secondary | ICD-10-CM

## 2018-06-08 DIAGNOSIS — R293 Abnormal posture: Secondary | ICD-10-CM

## 2018-06-08 DIAGNOSIS — M62838 Other muscle spasm: Secondary | ICD-10-CM

## 2018-06-08 NOTE — Therapy (Signed)
[place], [place], [address] Phone: [phone]   Fax:  [phone]  Physical Therapy Treatment  Patient Details  Name: Mckynzi Cammon MRN: [id number] Date of Birth: July 25, 1981 Referring Provider: Sarina Ill MD   Encounter Date: 06/08/2018  PT End of Session - 06/08/18 1714    Visit Number  5    Number of Visits  13    Date for PT Re-Evaluation  06/15/18    PT Start Time  1632    PT Stop Time  1712    PT Time Calculation (min)  40 min    Activity Tolerance  Patient tolerated treatment well    Behavior During Therapy  [facility] for tasks assessed/performed       Past Medical History:  Diagnosis Date  . Anxiety    self reported  . Depression    self reported  . Migraine   . No pertinent past medical history     Past Surgical History:  Procedure Laterality Date  . CESAREAN SECTION  2013  . laparscopy    . SHOULDER CAPSULORRHAPHY      There were no vitals filed for this visit.  Subjective Assessment - 06/08/18 1635    Subjective  "I feel good today, much improved."    Currently in Pain?  Yes    Pain Score  2     Pain Location  Neck    Pain Orientation  Right    Pain Descriptors / Indicators  -- light    Pain Type  Chronic pain    Pain Onset  More than a month ago    Pain Frequency  Intermittent                       [facility] Adult PT Treatment/Exercise - 06/08/18 0001      Shoulder Exercises: Supine   Protraction  Right;Left rhythmic stabilization 3 x 10-15 sec      Shoulder Exercises: Seated   Row  10 reps;AROM;Strengthening;Both x 2 sets    Horizontal ABduction  AROM;Strengthening;Both;15 reps    External Rotation  15 reps;Both;Theraband x 2 sets    Theraband Level (Shoulder External Rotation)  Level 2 (Red)    Diagonals  15 reps;Right;Left;AROM;Strengthening x 2 sets    Theraband Level (Shoulder Diagonals)  Level 2 (Red)      Manual Therapy   Soft tissue mobilization   self MTPr with theracane               PT Short Term Goals - 05/04/18 1124      PT SHORT TERM GOAL #1   Title  pt to be I with inital HEP    Time  3    Period  Weeks    Status  New    Target Date  05/25/18      PT SHORT TERM GOAL #2   Title  pt to verbalize/ demo proper posture in sitting/ standing to reduce abnormal muscle tension and reduce neck pain/ spasm    Time  3    Period  Weeks    Status  New    Target Date  05/25/18      PT SHORT TERM GOAL #3   Title  pt to reduce upper trap and surrounding muscle spasm to reduce pain to </= 5/10 and promote cervical mobility     Time  3    Period  Weeks    Status  New  Target Date  05/25/18        PT Long Term Goals - 05/04/18 1128      PT LONG TERM GOAL #1   Title  pt to increase cervical flexion to >/= 30 degrees and extension to >/= 50 degrees,  bil sidbending to >/= 45 degrees, and improve L rotation to >/= 60 degrees for functional mobility for safety with driving with </= 1/82 pain    Time  6    Period  Weeks    Status  New    Target Date  06/15/18      PT LONG TERM GOAL #2   Title  pt to be able to sit, stand and walk >/= 60 min with </= 2/10 pain for functional endurance required for school related activities utilizing proper posture     Time  6    Period  Weeks    Status  New    Target Date  06/15/18      PT LONG TERM GOAL #3   Title  pt to report being able to go >/= 2 weeks without having a HA to promote pt personal goal and improve QOL     Time  6    Period  Weeks    Status  New    Target Date  06/15/18      PT LONG TERM GOAL #4   Title  increase FOTO score to </= 42% limited to demo improvement in function    Time  6    Period  Weeks    Status  New    Target Date  06/15/18      PT LONG TERM GOAL #5   Title  pt to be I with all HEP given as of last visit to maintain and progress current level of function     Time  6    Period  Weeks    Status  New    Target Date  06/15/18             Plan - 06/08/18 1702    Clinical Impression Statement  Treatment today focused on teaching of self MTPr and shoulder and scapular strengthening. Pt tolerated treatment well. She reports no increase in pain following treatment.     PT Treatment/Interventions  ADLs/Self Care Home Management;Cryotherapy;Electrical Stimulation;Iontophoresis 4mg /ml Dexamethasone;Moist Heat;Traction;Ultrasound;Therapeutic activities;Therapeutic exercise;Manual techniques;Patient/family education;Passive range of motion;Dry needling;Taping;Vasopneumatic Device    PT Next Visit Plan  review/ update HEP, how was DN, thoracic mobility, cervical stability/ flexors, Modalities PRN, teach tennis ball suboccipital release, theracane MTPr, postural training, shoulder strengthening, scapular stabilization    PT Home Exercise Plan  chin tuck head lift, upper trap, rib mobs, upper cervical rotation; serratus punches, scap retractions, horizontal abduction, shoulder ER, D2 diagonal with red tband    Consulted and Agree with Plan of Care  Patient       Patient will benefit from skilled therapeutic intervention in order to improve the following deficits and impairments:  Pain, Improper body mechanics, Postural dysfunction, Increased fascial restricitons, Increased muscle spasms, Decreased activity tolerance, Decreased endurance  Visit Diagnosis: Cervicalgia  Other muscle spasm  Abnormal posture     Problem List Patient Active Problem List   Diagnosis Date Noted  . ADHD 12/10/2017  . Insomnia 12/10/2017  . Chronic migraine without aura without status migrainosus, not intractable 03/05/2017   Worthy Flank, SPT 06/08/18 5:16 PM   [facility] [address] [place], [place], [address] Phone: [phone]  Fax:  [phone]  Name: Kierstynn Babich MRN: [id number] Date of Birth: 1980/12/02

## 2018-06-09 ENCOUNTER — Encounter: Payer: Self-pay | Admitting: Physical Therapy

## 2018-06-09 ENCOUNTER — Ambulatory Visit: Payer: BC Managed Care – PPO | Admitting: Neurology

## 2018-06-15 ENCOUNTER — Encounter: Payer: Self-pay | Admitting: Physical Therapy

## 2018-06-15 ENCOUNTER — Ambulatory Visit: Payer: BC Managed Care – PPO | Admitting: Physical Therapy

## 2018-06-15 DIAGNOSIS — R293 Abnormal posture: Secondary | ICD-10-CM

## 2018-06-15 DIAGNOSIS — M542 Cervicalgia: Secondary | ICD-10-CM

## 2018-06-15 DIAGNOSIS — M62838 Other muscle spasm: Secondary | ICD-10-CM

## 2018-06-15 NOTE — Therapy (Addendum)
[place], [place], [address] Phone: [phone]   Fax:  [phone]  Physical Therapy Treatment  Patient Details  Name: Arlisha Patalano MRN: [id number] Date of Birth: [birth year] Referring Provider: Sarina Ill MD   Encounter Date: 06/15/2018  PT End of Session - 06/15/18 1724    Visit Number  6    Number of Visits  13    Date for PT Re-Evaluation  06/15/18    PT Start Time  0092    PT Stop Time  1717    PT Time Calculation (min)  46 min    Activity Tolerance  Patient tolerated treatment well    Behavior During Therapy  [facility] for tasks assessed/performed       Past Medical History:  Diagnosis Date  . Anxiety    self reported  . Depression    self reported  . Migraine   . No pertinent past medical history     Past Surgical History:  Procedure Laterality Date  . CESAREAN SECTION  2013  . laparscopy    . SHOULDER CAPSULORRHAPHY      There were no vitals filed for this visit.  Subjective Assessment - 06/15/18 1632    Subjective  "I don't feel so much pain today, but there is a lot of tension. My R arm goes numb when I hold it on the steering wheel."    Currently in Pain?  Yes    Pain Score  7     Pain Location  Neck    Pain Orientation  Right    Pain Descriptors / Indicators  Nagging    Pain Type  Chronic pain    Pain Onset  More than a month ago    Pain Frequency  Intermittent    Aggravating Factors   driving                       [facility] Adult PT Treatment/Exercise - 06/15/18 0001      Shoulder Exercises: Standing   Extension  Both;10 reps;AROM;Strengthening;Theraband x2.     Theraband Level (Shoulder Extension)  Level 2 (Red)    Row  10 reps;Both;Strengthening;AROM;Theraband    Theraband Level (Shoulder Row)  Level 3 (Green)    Other Standing Exercises  lower trap lift offs x 10 cues for proper form and to not shift whole body      Manual Therapy   Manual therapy  comments  skilled palpation and monitoring pt throughout TPDN       Trigger Point Dry Needling - 06/15/18 1653    Muscles Treated Upper Body  Scalenes    Scalenes Response  Twitch reponse elicited;Palpable increased muscle length    Upper Trapezius Response  Twitch reponse elicited;Palpable increased muscle length    Pectoralis Major Response  Twitch response elicited;Palpable increased muscle length    Levator Scapulae Response  Twitch response elicited;Palpable increased muscle length           PT Education - 06/15/18 1722    Education Details  importance of exercise and long term benefits as opposed to dry needling's temporary benefits, muscle fatigue, nerve glides    Person(s) Educated  Patient    Methods  Explanation    Comprehension  Verbalized understanding       PT Short Term Goals - 05/04/18 1124      PT SHORT TERM GOAL #1   Title  pt to be I with inital HEP  Time  3    Period  Weeks    Status  New    Target Date  05/25/18      PT SHORT TERM GOAL #2   Title  pt to verbalize/ demo proper posture in sitting/ standing to reduce abnormal muscle tension and reduce neck pain/ spasm    Time  3    Period  Weeks    Status  New    Target Date  05/25/18      PT SHORT TERM GOAL #3   Title  pt to reduce upper trap and surrounding muscle spasm to reduce pain to </= 5/10 and promote cervical mobility     Time  3    Period  Weeks    Status  New    Target Date  05/25/18        PT Long Term Goals - 05/04/18 1128      PT LONG TERM GOAL #1   Title  pt to increase cervical flexion to >/= 30 degrees and extension to >/= 50 degrees,  bil sidbending to >/= 45 degrees, and improve L rotation to >/= 60 degrees for functional mobility for safety with driving with </= 0/86 pain    Time  6    Period  Weeks    Status  New    Target Date  06/15/18      PT LONG TERM GOAL #2   Title  pt to be able to sit, stand and walk >/= 60 min with </= 2/10 pain for functional endurance  required for school related activities utilizing proper posture     Time  6    Period  Weeks    Status  New    Target Date  06/15/18      PT LONG TERM GOAL #3   Title  pt to report being able to go >/= 2 weeks without having a HA to promote pt personal goal and improve QOL     Time  6    Period  Weeks    Status  New    Target Date  06/15/18      PT LONG TERM GOAL #4   Title  increase FOTO score to </= 42% limited to demo improvement in function    Time  6    Period  Weeks    Status  New    Target Date  06/15/18      PT LONG TERM GOAL #5   Title  pt to be I with all HEP given as of last visit to maintain and progress current level of function     Time  6    Period  Weeks    Status  New    Target Date  06/15/18            Plan - 06/15/18 1726    Clinical Impression Statement  TPDN of R upper trap, levator scap, pec major, SCM, and scalenes performed today. Scapular and shoulder strengthening performed following TPDN. Pt educated on importance of exercise and its long term benefits.     PT Treatment/Interventions  ADLs/Self Care Home Management;Cryotherapy;Electrical Stimulation;Iontophoresis 4mg /ml Dexamethasone;Moist Heat;Traction;Ultrasound;Therapeutic activities;Therapeutic exercise;Manual techniques;Patient/family education;Passive range of motion;Dry needling;Taping;Vasopneumatic Device    PT Next Visit Plan  REASSESS,review/ update HEP, how was DN, thoracic mobility, cervical stability/ flexors, Modalities PRN, teach tennis ball suboccipital release, theracane MTPr, postural training, shoulder strengthening, scapular stabilization, attempt prone ITYs    PT Home Exercise Plan  chin tuck head  lift, upper trap, rib mobs, upper cervical rotation; serratus punches, scap retractions, horizontal abduction, shoulder ER, D2 diagonal with red tband, lower trap lift offs    Consulted and Agree with Plan of Care  Patient       Patient will benefit from skilled therapeutic  intervention in order to improve the following deficits and impairments:  Pain, Improper body mechanics, Postural dysfunction, Increased fascial restricitons, Increased muscle spasms, Decreased activity tolerance, Decreased endurance  Visit Diagnosis: Cervicalgia  Other muscle spasm  Abnormal posture     Problem List Patient Active Problem List   Diagnosis Date Noted  . ADHD 12/10/2017  . Insomnia 12/10/2017  . Chronic migraine without aura without status migrainosus, not intractable 03/05/2017   Worthy Flank, SPT 06/15/18 5:39 PM   [place] [facility] [address] [place], [place], [address] Phone: [phone]   Fax:  [phone]  Name: Debbie [name] MRN: [id number] Date of Birth: October 13, 1981

## 2018-06-16 ENCOUNTER — Ambulatory Visit: Payer: BC Managed Care – PPO | Admitting: Physical Therapy

## 2018-06-20 ENCOUNTER — Encounter: Payer: Self-pay | Admitting: Neurology

## 2018-06-23 ENCOUNTER — Ambulatory Visit: Payer: BC Managed Care – PPO | Admitting: Physical Therapy

## 2018-06-23 ENCOUNTER — Encounter: Payer: Self-pay | Admitting: Physical Therapy

## 2018-06-23 DIAGNOSIS — M542 Cervicalgia: Secondary | ICD-10-CM

## 2018-06-23 DIAGNOSIS — R293 Abnormal posture: Secondary | ICD-10-CM

## 2018-06-23 DIAGNOSIS — M62838 Other muscle spasm: Secondary | ICD-10-CM

## 2018-06-23 NOTE — Therapy (Addendum)
[place], [place], [address] Phone: [phone]   Fax:  [phone]  Physical Therapy Treatment/Re-certification  Patient Details  Name: Debbie [name] MRN: [id number] Date of Birth: 08-06-1981 Referring Provider: Sarina Ill MD   Encounter Date: 06/23/2018  PT End of Session - 06/23/18 1550    Visit Number  7    Number of Visits  13    Date for PT Re-Evaluation  07/14/18    PT Start Time  9379    PT Stop Time  1640    PT Time Calculation (min)  55 min    Activity Tolerance  Patient tolerated treatment well    Behavior During Therapy  [facility] for tasks assessed/performed       Past Medical History:  Diagnosis Date  . Anxiety    self reported  . Depression    self reported  . Migraine   . No pertinent past medical history     Past Surgical History:  Procedure Laterality Date  . CESAREAN SECTION  2013  . laparscopy    . SHOULDER CAPSULORRHAPHY      There were no vitals filed for this visit.  Subjective Assessment - 06/23/18 1547    Subjective  "I had a lot of bad migraine days recently. I am so tight."    Currently in Pain?  Yes    Pain Score  6     Pain Location  Neck    Pain Orientation  Left    Pain Descriptors / Indicators  Constant    Pain Type  Chronic pain    Pain Radiating Towards  Pain travels into R shoulder    Pain Onset  More than a month ago    Pain Frequency  Intermittent    Aggravating Factors   Driving    Pain Relieving Factors  stretching and ice         [facility] PT Assessment - 06/23/18 0001      Observation/Other Assessments   Focus on Therapeutic Outcomes (FOTO)   55% limited  predicted 42% limited      AROM   Cervical Flexion  55    Cervical Extension  50    Cervical - Right Side Bend  45    Cervical - Left Side Bend  50    Cervical - Right Rotation  40 tension    Cervical - Left Rotation  40 tension                   [facility] Adult PT  Treatment/Exercise - 06/23/18 0001      Self-Care   Self-Care  -- Associate Professor for MTPr      Shoulder Exercises: Prone   Other Prone Exercises  I, T, Ys 2 x 10 ea      Shoulder Exercises: Standing   Protraction  10 reps wall serratus push up x 2 sets    Other Standing Exercises  lower trap lift offs 2  x 10      Cryotherapy   Number Minutes Cryotherapy  10 Minutes pt had headache and became nauseated    Cryotherapy Location  -- head    Type of Cryotherapy  Ice pack      Manual Therapy   Manual therapy comments  Brandt-Daroff manuever x 2 bilaterally Pt reports decrease in symptoms    Soft tissue mobilization  MTPr of L upper trap             PT  Education - 06/23/18 1655    Education Details  education on chronic pain syndrome, vestibular system and its effects on dizziness, Brandt-daroff exercises    Person(s) Educated  Patient    Methods  Explanation    Comprehension  Verbalized understanding       PT Short Term Goals - 06/23/18 1556      PT SHORT TERM GOAL #1   Title  pt to be I with inital HEP    Status  Achieved      PT SHORT TERM GOAL #2   Title  pt to verbalize/ demo proper posture in sitting/ standing to reduce abnormal muscle tension and reduce neck pain/ spasm    Status  Achieved      PT SHORT TERM GOAL #3   Title  pt to reduce upper trap and surrounding muscle spasm to reduce pain to </= 5/10 and promote cervical mobility     Status  Achieved        PT Long Term Goals - 06/23/18 1557      PT LONG TERM GOAL #1   Title  pt to increase cervical flexion to >/= 30 degrees and extension to >/= 50 degrees,  bil sidbending to >/= 45 degrees, and improve L rotation to >/= 60 degrees for functional mobility for safety with driving with </= 9/16 pain    Status  On-going      PT LONG TERM GOAL #2   Title  pt to be able to sit, stand and walk >/= 60 min with </= 2/10 pain for functional endurance required for school related activities utilizing proper posture      Status  On-going      PT LONG TERM GOAL #3   Title  pt to report being able to go >/= 2 weeks without having a HA to promote pt personal goal and improve QOL     Baseline  5 days    Status  On-going      PT LONG TERM GOAL #4   Title  increase FOTO score to </= 42% limited to demo improvement in function      PT LONG TERM GOAL #5   Title  pt to be I with all HEP given as of last visit to maintain and progress current level of function     Status  On-going            Plan - 06/23/18 1644    Clinical Impression Statement  Pt reporting some increase in migraines over the past few days. Reassessment performed today, pt demonstrates increased cervical ROM except for rotation. Has met or partially achieved LTGs. Treatment focused on MTPr and shoulder strenthening. Progressed to prone I,T,Ys today. pt reported back spasm while performing exercise. She then became nauseous and slightly dizzy. Ice pack applied to forehead at pt's request. Performed Brandt-daroff exercises and pt reports decrease in symptoms. Based on reassessment and progress, pt would benefit from further OPPT services to address pain, ROM, shoulder and scapular strengthening, functional mobility, and progression of independent exercise.      PT Frequency  1x / week    PT Duration  3 weeks    PT Treatment/Interventions  ADLs/Self Care Home Management;Cryotherapy;Electrical Stimulation;Iontophoresis 57m/ml Dexamethasone;Moist Heat;Traction;Ultrasound;Therapeutic activities;Therapeutic exercise;Manual techniques;Patient/family education;Passive range of motion;Dry needling;Taping;Vasopneumatic Device    PT Next Visit Plan  review/ update HEP, how was DN, thoracic mobility, cervical stability/ flexors, Modalities PRN, teach tennis ball suboccipital release, theracane MTPr, postural training, shoulder strengthening, scapular stabilization,  attempt prone ITYs    PT Home Exercise Plan  chin tuck head lift, upper trap, rib mobs, upper  cervical rotation; serratus punches, scap retractions, horizontal abduction, shoulder ER, D2 diagonal with red tband, lower trap lift offs    Consulted and Agree with Plan of Care  Patient       Patient will benefit from skilled therapeutic intervention in order to improve the following deficits and impairments:  Pain, Improper body mechanics, Postural dysfunction, Increased fascial restricitons, Increased muscle spasms, Decreased activity tolerance, Decreased endurance  Visit Diagnosis: Cervicalgia  Other muscle spasm  Abnormal posture     Problem List Patient Active Problem List   Diagnosis Date Noted  . ADHD 12/10/2017  . Insomnia 12/10/2017  . Chronic migraine without aura without status migrainosus, not intractable 03/05/2017   Worthy Flank, SPT 06/23/18 5:53 PM   [place] [facility] [address] [place], [place], [address] Phone: [phone]   Fax:  [phone]  Name: Debbie [name] MRN: [id number] Date of Birth: Apr 08, 1981

## 2018-06-30 ENCOUNTER — Ambulatory Visit: Payer: BC Managed Care – PPO | Admitting: Physical Therapy

## 2018-06-30 ENCOUNTER — Telehealth: Payer: Self-pay | Admitting: *Deleted

## 2018-06-30 NOTE — Telephone Encounter (Signed)
Called pt and LVM asking for call back tomorrow or whenever she can. Left office number in message.   Received a PA for Aimovig however insurance requires trials of Ajovy and [employer] first. Pt has tried Ajovy but not [employer]. Per Dr. Jaynee Eagles, will switch to [facility]. When pt calls back this will be discussed and order will changed per verbal from Dr. Jaynee Eagles.

## 2018-07-06 ENCOUNTER — Other Ambulatory Visit: Payer: Self-pay | Admitting: *Deleted

## 2018-07-06 MED ORDER — GALCANEZUMAB-GNLM 120 MG/ML ~~LOC~~ SOAJ: 240.0000 mg | Freq: Once | SUBCUTANEOUS | 0 refills | Status: DC

## 2018-07-06 NOTE — Progress Notes (Signed)
Emgality ordered and Aimovig d/c'd. Note sent to pharmacy in order.

## 2018-07-07 ENCOUNTER — Encounter: Payer: Self-pay | Admitting: Physical Therapy

## 2018-07-07 ENCOUNTER — Ambulatory Visit: Payer: BC Managed Care – PPO | Attending: Neurology | Admitting: Physical Therapy

## 2018-07-07 DIAGNOSIS — M62838 Other muscle spasm: Secondary | ICD-10-CM | POA: Diagnosis present

## 2018-07-07 DIAGNOSIS — R293 Abnormal posture: Secondary | ICD-10-CM | POA: Diagnosis present

## 2018-07-07 DIAGNOSIS — M542 Cervicalgia: Secondary | ICD-10-CM

## 2018-07-07 NOTE — Therapy (Addendum)
[place], [place], [address] Phone: [phone]   Fax:  [phone]  Physical Therapy Treatment / Discharge summary  Patient Details  Name: Debbie [name] MRN: [id number] Date of Birth: 1981-08-05 Referring Provider: Sarina Ill MD   Encounter Date: 07/07/2018  PT End of Session - 07/07/18 1622    Visit Number  8    Number of Visits  13    Date for PT Re-Evaluation  07/14/18    PT Start Time  0347    PT Stop Time  0430    PT Time Calculation (min)  43 min    Activity Tolerance  Patient tolerated treatment well    Behavior During Therapy  [facility] for tasks assessed/performed       Past Medical History:  Diagnosis Date  . Anxiety    self reported  . Depression    self reported  . Migraine   . No pertinent past medical history     Past Surgical History:  Procedure Laterality Date  . CESAREAN SECTION  2013  . laparscopy    . SHOULDER CAPSULORRHAPHY      There were no vitals filed for this visit.  Subjective Assessment - 07/07/18 1550    Subjective  "I feel a little more sore because I have been coughing."    Currently in Pain?  Yes    Pain Score  6     Pain Location  Neck    Pain Orientation  Left    Pain Descriptors / Indicators  Aching;Constant    Pain Type  Chronic pain    Pain Radiating Towards  staying in neck    Pain Onset  More than a month ago    Pain Frequency  Intermittent    Aggravating Factors   sitting in same position for too long, too much cervical rotation, arms above head for too long    Pain Relieving Factors  stretching and ice                       [facility] Adult PT Treatment/Exercise - 07/07/18 0001      Neck Exercises: Seated   Other Seated Exercise  arm bike level 1 x 4 min    2 min forward and backward     Manual Therapy   Manual therapy comments  skilled palpation and monitoring pt throughout TPDN    Soft tissue mobilization  MTPr of L upper trap       Neck Exercises: Stretches   Upper Trapezius Stretch  Right;2 reps;30 seconds    Other Neck Stretches  R SCM stretch 2 x 30       Trigger Point Dry Needling - 07/07/18 1642    Consent Given?  Yes    Education Handout Provided  No    Muscles Treated Upper Body  Upper trapezius;Scalenes   SCM   Sternocleidomastoid Response  Twitch response elicited;Palpable increased muscle length    Scalenes Response  Twitch reponse elicited;Palpable increased muscle length    Upper Trapezius Response  Twitch reponse elicited;Palpable increased muscle length             PT Short Term Goals - 06/23/18 1556      PT SHORT TERM GOAL #1   Title  pt to be I with inital HEP    Status  Achieved      PT SHORT TERM GOAL #2   Title  pt to verbalize/ demo proper  posture in sitting/ standing to reduce abnormal muscle tension and reduce neck pain/ spasm    Status  Achieved      PT SHORT TERM GOAL #3   Title  pt to reduce upper trap and surrounding muscle spasm to reduce pain to </= 5/10 and promote cervical mobility     Status  Achieved        PT Long Term Goals - 06/23/18 1557      PT LONG TERM GOAL #1   Title  pt to increase cervical flexion to >/= 30 degrees and extension to >/= 50 degrees,  bil sidbending to >/= 45 degrees, and improve L rotation to >/= 60 degrees for functional mobility for safety with driving with </= 3/22 pain    Status  On-going      PT LONG TERM GOAL #2   Title  pt to be able to sit, stand and walk >/= 60 min with </= 2/10 pain for functional endurance required for school related activities utilizing proper posture     Status  On-going      PT LONG TERM GOAL #3   Title  pt to report being able to go >/= 2 weeks without having a HA to promote pt personal goal and improve QOL     Baseline  5 days    Status  On-going      PT LONG TERM GOAL #4   Title  increase FOTO score to </= 42% limited to demo improvement in function      PT LONG TERM GOAL #5   Title  pt to  be I with all HEP given as of last visit to maintain and progress current level of function     Status  On-going            Plan - 07/07/18 1622    Clinical Impression Statement  Pt reporting some increased soreness today in neck. Attempted MTPr of L upper trap, but not having any relief. TPDN performed to L upper trap, scalenes, and SCM. Followed with upper trap stretch, SCM stretch, and upper arm bike. Pt reports decreased pain at end of session.    PT Treatment/Interventions  ADLs/Self Care Home Management;Cryotherapy;Electrical Stimulation;Iontophoresis 70m/ml Dexamethasone;Moist Heat;Traction;Ultrasound;Therapeutic activities;Therapeutic exercise;Manual techniques;Patient/family education;Passive range of motion;Dry needling;Taping;Vasopneumatic Device    PT Next Visit Plan  review/ update HEP, how was DN, thoracic mobility, cervical stability/ flexors, Modalities PRN, teach tennis ball suboccipital release, theracane MTPr, postural training, shoulder strengthening, scapular stabilization, attempt prone ITYs    PT Home Exercise Plan  chin tuck head lift, upper trap, rib mobs, upper cervical rotation; serratus punches, scap retractions, horizontal abduction, shoulder ER, D2 diagonal with red tband, lower trap lift offs    Consulted and Agree with Plan of Care  Patient       Patient will benefit from skilled therapeutic intervention in order to improve the following deficits and impairments:  Pain, Improper body mechanics, Postural dysfunction, Increased fascial restricitons, Increased muscle spasms, Decreased activity tolerance, Decreased endurance  Visit Diagnosis: Cervicalgia  Other muscle spasm  Abnormal posture     Problem List Patient Active Problem List   Diagnosis Date Noted  . ADHD 12/10/2017  . Insomnia 12/10/2017  . Chronic migraine without aura without status migrainosus, not intractable 03/05/2017   MWorthy [name] [name] 07/07/18 4:43 PM   Debbie [name]: [phone]  Fax:  [phone] Name: Debbie [name]  MRN: [id number] Date of Birth: 02-08-1981       PHYSICAL THERAPY DISCHARGE SUMMARY  Visits from Start of Care: 8  Current functional level related to goals / functional outcomes: See goals   Remaining deficits: unknown   Education / Equipment: HEP, posture, theraband  Plan: Patient agrees to discharge.  Patient goals were partially met. Patient is being discharged due to not returning since the last visit.  ?????        Kristoffer Leamon PT, DPT, LAT, ATC  08/17/18  3:25 PM

## 2018-07-12 ENCOUNTER — Ambulatory Visit: Payer: BC Managed Care – PPO | Admitting: Physical Therapy

## 2018-07-19 ENCOUNTER — Other Ambulatory Visit: Payer: Self-pay | Admitting: Neurology

## 2018-07-19 ENCOUNTER — Other Ambulatory Visit: Payer: Self-pay | Admitting: *Deleted

## 2018-07-19 MED ORDER — AMPHETAMINE-DEXTROAMPHETAMINE 10 MG PO TABS: 10.0000 mg | ORAL_TABLET | Freq: Two times a day (BID) | ORAL | 0 refills | Status: DC

## 2018-07-19 MED ORDER — AMPHETAMINE-DEXTROAMPHETAMINE 10 MG PO TABS
10.0000 mg | ORAL_TABLET | Freq: Two times a day (BID) | ORAL | 0 refills | Status: DC
Start: 2018-07-19 — End: 2018-07-19

## 2018-07-28 ENCOUNTER — Telehealth: Payer: Self-pay | Admitting: Physical Therapy

## 2018-07-28 ENCOUNTER — Ambulatory Visit: Payer: BC Managed Care – PPO | Attending: Neurology | Admitting: Physical Therapy

## 2018-07-28 NOTE — Telephone Encounter (Signed)
LVM regarding missed appointment today and that today was her last scheduled appointment. If she feels she no long needs physical therapy to let us know and we can go ahead and discharge. If she  Feels she needs more we can set up another appointment for re-assessment, either way to call back.

## 2018-08-02 ENCOUNTER — Other Ambulatory Visit: Payer: Self-pay | Admitting: Neurology

## 2018-08-03 ENCOUNTER — Encounter: Payer: Self-pay | Admitting: *Deleted

## 2018-08-03 NOTE — Telephone Encounter (Signed)
Emailed pt to clarify.

## 2018-08-04 NOTE — Telephone Encounter (Signed)
Spoke with [facility]. She confirmed that the patient had picked up the Emgality loading dose 240 mg (2 pens) last month. Refill needed. RN advised that a prescription for the maintenance dose of 120 mg (1 pen), to be given every 30 days, will be sent to the pharmacy. Dahlia verbalized understanding and appreciation.   Maintenance dose of Emgality 120 mg placed per v.o. Dr. Jaynee Eagles.

## 2018-08-23 ENCOUNTER — Other Ambulatory Visit: Payer: Self-pay | Admitting: *Deleted

## 2018-08-23 MED ORDER — AMPHETAMINE-DEXTROAMPHETAMINE 10 MG PO TABS: 10.0000 mg | ORAL_TABLET | Freq: Two times a day (BID) | ORAL | 0 refills | Status: DC

## 2018-08-23 NOTE — Telephone Encounter (Signed)
Last fill 07/19/18 per drug database. Refill due.

## 2018-08-26 ENCOUNTER — Other Ambulatory Visit: Payer: Self-pay | Admitting: *Deleted

## 2018-08-26 ENCOUNTER — Encounter: Payer: Self-pay | Admitting: *Deleted

## 2018-08-26 ENCOUNTER — Other Ambulatory Visit: Payer: Self-pay | Admitting: Neurology

## 2018-08-26 ENCOUNTER — Telehealth: Payer: Self-pay | Admitting: *Deleted

## 2018-08-26 MED ORDER — ERENUMAB-AOOE 140 MG/ML ~~LOC~~ SOAJ: 140.0000 mg | SUBCUTANEOUS | 11 refills | Status: DC

## 2018-08-26 NOTE — Progress Notes (Signed)
D/C Emgality and restart Aimovig per v.o. Dr. Jaynee Eagles.

## 2018-08-26 NOTE — Telephone Encounter (Signed)
Received fax from [place]. Aimovig has been approved from 08/26/2018 through 08/27/2019!   Emailed pt.

## 2018-08-26 NOTE — Telephone Encounter (Signed)
Completed PA on Cover My Meds for Aimovig. KEY: [id number]. Also faxed letter of support and chart notes to Cover My Meds to be uploaded into PA. Received a receipt of confirmation. Per AJ @ Cover My Meds, letter & chart note have been uploaded.   Included in PA: "To Whom It May Concern,  I am writing to you on behalf Dr. Cathren Laine patient, Debbie [name], to request approval of Aimovig 140 mg/ 1 mL per 30 days. The patient has tried and failed Ajovy and Emgality. She did have a decrease in her migraines with Ajovy and took it from 10/2017 through 04/21/2018. However she felt she had a reaction in her stomach. The Emgality did not work at all. She is having daily migraines with Emgality and has already taken three doses. The patient previously tried Aimovig and had positive results with a reduction of migraines down to 4-5 per month. For this reason, Dr. Jaynee Eagles has ordered for patient to restart the Aimovig which has been the best control of her migraines so far."

## 2018-09-28 NOTE — Telephone Encounter (Signed)
[facility] Clarified that Aimovig is the only CGRP that pt should be on. Our office last sent Aimovig there on 10/3. She verbalized understanding and receipt of that order. She stated that the patient had confirmed a refill of the Ajovy, but she is aware now that pt should only be on Aimovig. Ajovy and Emgality should be canceled.

## 2018-10-06 ENCOUNTER — Other Ambulatory Visit: Payer: Self-pay | Admitting: Neurology

## 2018-10-06 MED ORDER — AMPHETAMINE-DEXTROAMPHETAMINE 15 MG PO TABS: 15.0000 mg | ORAL_TABLET | Freq: Every day | ORAL | 0 refills | Status: DC

## 2018-10-07 ENCOUNTER — Telehealth: Payer: Self-pay

## 2018-10-07 NOTE — Telephone Encounter (Signed)
Pending approval for Adderall 15 mg tablets. Key: [id number] Rx #: [id number] ICD 10 code: F90.9 Waiting on PA questions. [facility]  [place], [place] [address] Tel: [phone] Fax: [phone]  Will update once the questions have been answered on cover my meds.

## 2018-10-07 NOTE — Telephone Encounter (Signed)
Cover my meds questions have been answered. Pending approval.

## 2018-10-15 ENCOUNTER — Other Ambulatory Visit: Payer: Self-pay | Admitting: Neurology

## 2018-10-15 MED ORDER — SUMATRIPTAN SUCCINATE 6 MG/0.5ML ~~LOC~~ SOLN: 6.0000 mg | SUBCUTANEOUS | 1 refills | Status: DC | PRN

## 2018-11-08 ENCOUNTER — Other Ambulatory Visit: Payer: Self-pay | Admitting: Neurology

## 2018-11-08 MED ORDER — AMPHETAMINE-DEXTROAMPHETAMINE 15 MG PO TABS: 15.0000 mg | ORAL_TABLET | Freq: Two times a day (BID) | ORAL | 0 refills | Status: DC

## 2018-12-02 ENCOUNTER — Other Ambulatory Visit: Payer: Self-pay | Admitting: *Deleted

## 2018-12-02 MED ORDER — AMPHETAMINE-DEXTROAMPHETAMINE 15 MG PO TABS: 15.0000 mg | ORAL_TABLET | Freq: Two times a day (BID) | ORAL | 0 refills | Status: DC

## 2018-12-02 NOTE — Telephone Encounter (Signed)
Last refill 11/09/2019 Dextroamp-Amphetamin 15 Mg Tab  #60 prescribed by Dr. Jaynee Eagles. Previous 2019 refills avg monthly by Dr. Jaynee Eagles. Last ov May 2019. Refill sent to Dr. Jaynee Eagles for authorization.

## 2018-12-03 ENCOUNTER — Other Ambulatory Visit: Payer: Self-pay | Admitting: Neurology

## 2018-12-04 ENCOUNTER — Other Ambulatory Visit: Payer: Self-pay | Admitting: Neurology

## 2018-12-04 MED ORDER — PROCHLORPERAZINE MALEATE 10 MG PO TABS: 10.0000 mg | ORAL_TABLET | Freq: Four times a day (QID) | ORAL | 6 refills | Status: DC | PRN

## 2018-12-09 ENCOUNTER — Telehealth: Payer: Self-pay | Admitting: *Deleted

## 2018-12-09 ENCOUNTER — Other Ambulatory Visit: Payer: Self-pay | Admitting: Neurology

## 2018-12-09 MED ORDER — AMPHETAMINE-DEXTROAMPHETAMINE 15 MG PO TABS: 15.0000 mg | ORAL_TABLET | Freq: Two times a day (BID) | ORAL | 0 refills | Status: DC

## 2018-12-09 NOTE — Telephone Encounter (Signed)
Amphetamine-Dextroamphetamine 15 mg PA completed, signed and faxed to [place]. Received a receipt of confirmation.

## 2019-01-10 ENCOUNTER — Other Ambulatory Visit: Payer: Self-pay | Admitting: *Deleted

## 2019-01-10 MED ORDER — AMPHETAMINE-DEXTROAMPHETAMINE 15 MG PO TABS: 15.0000 mg | ORAL_TABLET | Freq: Two times a day (BID) | ORAL | 0 refills | Status: DC

## 2019-01-10 MED ORDER — SUMATRIPTAN SUCCINATE 6 MG/0.5ML ~~LOC~~ SOLN: 6.0000 mg | SUBCUTANEOUS | 1 refills | Status: DC | PRN

## 2019-01-10 NOTE — Telephone Encounter (Signed)
Pt due for refills. Checked registry for Adderall.

## 2019-01-13 ENCOUNTER — Other Ambulatory Visit: Payer: Self-pay | Admitting: *Deleted

## 2019-01-13 MED ORDER — AMPHETAMINE-DEXTROAMPHETAMINE 15 MG PO TABS: 15.0000 mg | ORAL_TABLET | Freq: Two times a day (BID) | ORAL | 0 refills | Status: DC

## 2019-01-13 NOTE — Telephone Encounter (Signed)
Spoke with Debbie [name] @ [facility]. Canceled Amphetamine-Dextroamphetamine 15 mg tablet as this will be sent to a local pharmacy. She verbalized understanding and appreciation for the call.

## 2019-01-24 ENCOUNTER — Telehealth: Payer: Self-pay | Admitting: *Deleted

## 2019-01-24 NOTE — Telephone Encounter (Signed)
Faith RN completed PA for Sumatriptan 6 mg injection and it was faxed to [place]. Received an approval letter from [place]. Sumatriptan 6 mg/0.5 mL approved from 01/21/2019 through 01/21/2022. Approvals may be subject to dosing limits.  [facility] [place] Received a receipt of confirmation.

## 2019-02-09 ENCOUNTER — Other Ambulatory Visit: Payer: Self-pay | Admitting: *Deleted

## 2019-02-09 MED ORDER — AMPHETAMINE-DEXTROAMPHETAMINE 15 MG PO TABS: 15.0000 mg | ORAL_TABLET | Freq: Two times a day (BID) | ORAL | 0 refills | Status: DC

## 2019-02-09 NOTE — Telephone Encounter (Signed)
Checked [place] registry, last refill 01/13/2019.

## 2019-02-14 ENCOUNTER — Other Ambulatory Visit: Payer: Self-pay | Admitting: *Deleted

## 2019-02-14 MED ORDER — AMPHETAMINE-DEXTROAMPHETAMINE 15 MG PO TABS: 15.0000 mg | ORAL_TABLET | Freq: Two times a day (BID) | ORAL | 0 refills | Status: DC

## 2019-02-14 NOTE — Telephone Encounter (Signed)
Adderall sent to [place] per Dr. Jaynee Eagles. I called [facility], spoke with [place] the pharmacist, and canceled the Adderall prescription. They verbalized understanding and appreciation for the call.

## 2019-03-16 ENCOUNTER — Other Ambulatory Visit: Payer: Self-pay | Admitting: *Deleted

## 2019-03-16 NOTE — Telephone Encounter (Signed)
Per [place] drug registry, adderall last filled 02/14/2019. Will send refill to Dr. Jaynee Eagles.

## 2019-03-17 MED ORDER — AMPHETAMINE-DEXTROAMPHETAMINE 15 MG PO TABS: 15.0000 mg | ORAL_TABLET | Freq: Two times a day (BID) | ORAL | 0 refills | Status: DC

## 2019-03-23 ENCOUNTER — Other Ambulatory Visit: Payer: Self-pay | Admitting: *Deleted

## 2019-03-23 MED ORDER — AMPHETAMINE-DEXTROAMPHETAMINE 15 MG PO TABS: 15.0000 mg | ORAL_TABLET | Freq: Two times a day (BID) | ORAL | 0 refills | Status: DC

## 2019-04-20 ENCOUNTER — Other Ambulatory Visit: Payer: Self-pay | Admitting: *Deleted

## 2019-04-20 MED ORDER — AMPHETAMINE-DEXTROAMPHETAMINE 15 MG PO TABS: 15.0000 mg | ORAL_TABLET | Freq: Two times a day (BID) | ORAL | 0 refills | Status: DC

## 2019-04-25 ENCOUNTER — Telehealth: Payer: Self-pay | Admitting: *Deleted

## 2019-04-25 ENCOUNTER — Encounter: Payer: Self-pay | Admitting: *Deleted

## 2019-04-25 NOTE — Telephone Encounter (Signed)
Spoke with pt regarding her yearly f/u appt on 6/4 @ 8:30 AM. Pt r/s to NP Amy same date/time. Also discussed doing doxy visit d/t covid 19 pandemic.  Pt understands that although there may be some limitations with this type of visit, we will take all precautions to reduce any security or privacy concerns.  Pt understands that this will be treated like an in office visit and we will file with pt's insurance, and there may be a patient responsible charge related to this service. Pt confirmed email as [email or web address]. Pt also confirmed name & DOB and provided updates/review of chart including History, medications. Pt understands the link will be sent to her email. She has both smart phone and computer with camera that she can use. Pt understands she will get a call at 8:00 AM to check-in and we ask that she join the virtual waiting room around 8:20-8:25 pm. She verbalized appreciation for the call.  Amy NP's doxy link emailed to pt.
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
## 2019-04-28 ENCOUNTER — Ambulatory Visit (INDEPENDENT_AMBULATORY_CARE_PROVIDER_SITE_OTHER): Payer: BC Managed Care – PPO | Admitting: Family Medicine

## 2019-04-28 ENCOUNTER — Other Ambulatory Visit: Payer: Self-pay

## 2019-04-28 ENCOUNTER — Encounter: Payer: Self-pay | Admitting: Family Medicine

## 2019-04-28 ENCOUNTER — Ambulatory Visit: Payer: BC Managed Care – PPO | Admitting: Neurology

## 2019-04-28 DIAGNOSIS — G43709 Chronic migraine without aura, not intractable, without status migrainosus: Secondary | ICD-10-CM | POA: Diagnosis not present

## 2019-04-28 DIAGNOSIS — F901 Attention-deficit hyperactivity disorder, predominantly hyperactive type: Secondary | ICD-10-CM | POA: Diagnosis not present

## 2019-04-28 DIAGNOSIS — G4700 Insomnia, unspecified: Secondary | ICD-10-CM

## 2019-04-28 MED ORDER — CYCLOBENZAPRINE HCL 5 MG PO TABS: 5.0000 mg | ORAL_TABLET | Freq: Three times a day (TID) | ORAL | 11 refills | Status: DC | PRN

## 2019-04-28 MED ORDER — SUMATRIPTAN SUCCINATE 6 MG/0.5ML ~~LOC~~ SOLN: 6.0000 mg | SUBCUTANEOUS | 1 refills | Status: DC | PRN

## 2019-04-28 MED ORDER — ERENUMAB-AOOE 140 MG/ML ~~LOC~~ SOAJ: 140.0000 mg | SUBCUTANEOUS | 11 refills | Status: DC

## 2019-05-02 ENCOUNTER — Telehealth: Payer: Self-pay | Admitting: Family Medicine

## 2019-05-02 NOTE — Telephone Encounter (Signed)
Attempted to call [facility] to give clarification on a order. The line was busy. I will attempt to call back later today.

## 2019-05-02 NOTE — Telephone Encounter (Signed)
Mardene Celeste [facility] called wanting to know if the pt was needing the [place] or the Stat kit for her SUMAtriptan (IMITREX) 6 MG/0.5ML SOLN injection please advise. [facility]#[id number]

## 2019-05-02 NOTE — Progress Notes (Signed)
Participated in, made any corrections needed, and agree with history, physical, neuro exam,assessment and plan as stated.     Fransisco Messmer, MD [facility] 

## 2019-05-03 NOTE — Telephone Encounter (Signed)
Spoke with the Pharmacist at [facility]. I was able to clarify on the order for SUMAtriptan (IMITREX) 6 MG/0.5ML SOLN injection. Per Amy last office not patient is to receive 10 vials with 1 refill.

## 2019-05-23 ENCOUNTER — Other Ambulatory Visit: Payer: Self-pay | Admitting: *Deleted

## 2019-05-23 ENCOUNTER — Encounter: Payer: Self-pay | Admitting: Family Medicine

## 2019-05-23 ENCOUNTER — Other Ambulatory Visit: Payer: Self-pay | Admitting: Neurology

## 2019-05-23 DIAGNOSIS — G43709 Chronic migraine without aura, not intractable, without status migrainosus: Secondary | ICD-10-CM

## 2019-05-23 MED ORDER — AMPHETAMINE-DEXTROAMPHETAMINE 15 MG PO TABS: 15.0000 mg | ORAL_TABLET | Freq: Two times a day (BID) | ORAL | 0 refills | Status: DC

## 2019-05-23 MED ORDER — NORTRIPTYLINE HCL 25 MG PO CAPS: 25.0000 mg | ORAL_CAPSULE | Freq: Two times a day (BID) | ORAL | 3 refills | Status: DC

## 2019-05-23 NOTE — Addendum Note (Signed)
Addended by: Brandon Melnick on: 05/23/2019 03:38 PM   Modules accepted: Orders

## 2019-05-23 NOTE — Addendum Note (Signed)
Addended byUbaldo Glassing, Samaj Wessells L on: 05/23/2019 04:05 PM   Modules accepted: Orders

## 2019-05-23 NOTE — Progress Notes (Signed)
Drug registry checked last seen 04/2019 with [name], NP  Last filled adderall 04-23-19 #60.  [facility]

## 2019-06-14 ENCOUNTER — Other Ambulatory Visit: Payer: Self-pay | Admitting: *Deleted

## 2019-06-14 DIAGNOSIS — Z20822 Contact with and (suspected) exposure to covid-19: Secondary | ICD-10-CM

## 2019-06-15 ENCOUNTER — Other Ambulatory Visit: Payer: Self-pay

## 2019-06-19 LAB — NOVEL CORONAVIRUS, NAA: SARS-CoV-2, NAA: NOT DETECTED

## 2019-07-05 ENCOUNTER — Other Ambulatory Visit: Payer: Self-pay | Admitting: Neurology

## 2019-07-05 DIAGNOSIS — G4700 Insomnia, unspecified: Secondary | ICD-10-CM

## 2019-07-05 DIAGNOSIS — G43709 Chronic migraine without aura, not intractable, without status migrainosus: Secondary | ICD-10-CM

## 2019-07-12 ENCOUNTER — Other Ambulatory Visit: Payer: Self-pay | Admitting: *Deleted

## 2019-07-12 ENCOUNTER — Encounter: Payer: Self-pay | Admitting: Family Medicine

## 2019-07-12 MED ORDER — AMPHETAMINE-DEXTROAMPHETAMINE 15 MG PO TABS: 15.0000 mg | ORAL_TABLET | Freq: Two times a day (BID) | ORAL | 0 refills | Status: DC

## 2019-07-12 NOTE — Telephone Encounter (Signed)
Pt requested adderall refill. Per  drug registry, last refill was on 05/23/2019 prescribed by Amy NP. Refill sent to Amy to authorize.

## 2019-07-14 ENCOUNTER — Telehealth: Payer: Self-pay

## 2019-07-14 NOTE — Telephone Encounter (Signed)
PA done on amphetamine-dextroamphetamine (ADDERALL) 15 MG tablet on cover my meds.

## 2019-07-14 NOTE — Telephone Encounter (Signed)
07/14/2019 - 07/13/2022 for Amphetamine_Dextropamphetamine 15mg  tabs. Contact number is  [phone].

## 2019-07-30 ENCOUNTER — Other Ambulatory Visit: Payer: Self-pay | Admitting: Neurology

## 2019-07-30 DIAGNOSIS — G4700 Insomnia, unspecified: Secondary | ICD-10-CM

## 2019-07-30 DIAGNOSIS — G43709 Chronic migraine without aura, not intractable, without status migrainosus: Secondary | ICD-10-CM

## 2019-08-03 ENCOUNTER — Encounter: Payer: Self-pay | Admitting: Family Medicine

## 2019-08-29 ENCOUNTER — Telehealth: Payer: Self-pay | Admitting: *Deleted

## 2019-08-29 NOTE — Telephone Encounter (Signed)
Attempted to complete Aimovig 140 mg PA on CMM. KEY: [id number].   Received this message from plan: Your PA has been resolved, no additional PA is required. For further inquiries please contact the number on the back of the member prescription card. (Message [phone])

## 2019-09-01 ENCOUNTER — Encounter: Payer: Self-pay | Admitting: Family Medicine

## 2019-09-05 ENCOUNTER — Other Ambulatory Visit: Payer: Self-pay | Admitting: *Deleted

## 2019-09-05 DIAGNOSIS — R42 Dizziness and giddiness: Secondary | ICD-10-CM | POA: Insufficient documentation

## 2019-09-05 DIAGNOSIS — H9191 Unspecified hearing loss, right ear: Secondary | ICD-10-CM | POA: Insufficient documentation

## 2019-09-05 MED ORDER — AMPHETAMINE-DEXTROAMPHETAMINE 15 MG PO TABS: 15.0000 mg | ORAL_TABLET | Freq: Two times a day (BID) | ORAL | 0 refills | Status: DC

## 2019-09-05 NOTE — Telephone Encounter (Signed)
Per [place] registry, last Aderall 15 mg refill 07/14/2019 ordered by Amy NP on 07/12/2019. Will send refill to provider to e-scribe.

## 2019-09-07 ENCOUNTER — Other Ambulatory Visit: Payer: Self-pay | Admitting: Neurology

## 2019-09-07 DIAGNOSIS — G43709 Chronic migraine without aura, not intractable, without status migrainosus: Secondary | ICD-10-CM

## 2019-09-08 ENCOUNTER — Encounter: Payer: Self-pay | Admitting: *Deleted

## 2019-10-14 ENCOUNTER — Encounter: Payer: Self-pay | Admitting: Family Medicine

## 2019-10-14 DIAGNOSIS — G43709 Chronic migraine without aura, not intractable, without status migrainosus: Secondary | ICD-10-CM

## 2019-10-17 MED ORDER — AIMOVIG 140 MG/ML ~~LOC~~ SOAJ: 140.0000 mg | SUBCUTANEOUS | 6 refills | Status: DC

## 2019-10-17 MED ORDER — AMPHETAMINE-DEXTROAMPHETAMINE 15 MG PO TABS: 15.0000 mg | ORAL_TABLET | Freq: Two times a day (BID) | ORAL | 0 refills | Status: DC

## 2019-10-17 NOTE — Telephone Encounter (Signed)
Drug registry checked  Last fill adderall 15mg  (1 BID)09-05-19 #60. [facility]

## 2019-11-25 DIAGNOSIS — G8929 Other chronic pain: Secondary | ICD-10-CM

## 2019-11-25 HISTORY — DX: Other chronic pain: G89.29

## 2019-11-26 ENCOUNTER — Emergency Department (HOSPITAL_COMMUNITY)
Admission: EM | Admit: 2019-11-26 | Discharge: 2019-11-26 | Disposition: A | Discharge status: Home / Self Care | Payer: BC Managed Care – PPO | Attending: Emergency Medicine | Admitting: Emergency Medicine

## 2019-11-26 ENCOUNTER — Emergency Department (HOSPITAL_COMMUNITY): Payer: BC Managed Care – PPO

## 2019-11-26 ENCOUNTER — Encounter (HOSPITAL_COMMUNITY): Payer: Self-pay | Admitting: Emergency Medicine

## 2019-11-26 ENCOUNTER — Other Ambulatory Visit: Payer: Self-pay

## 2019-11-26 DIAGNOSIS — R202 Paresthesia of skin: Secondary | ICD-10-CM | POA: Diagnosis not present

## 2019-11-26 DIAGNOSIS — R200 Anesthesia of skin: Secondary | ICD-10-CM | POA: Diagnosis not present

## 2019-11-26 DIAGNOSIS — Z3202 Encounter for pregnancy test, result negative: Secondary | ICD-10-CM | POA: Diagnosis not present

## 2019-11-26 DIAGNOSIS — Z79899 Other long term (current) drug therapy: Secondary | ICD-10-CM | POA: Diagnosis not present

## 2019-11-26 DIAGNOSIS — R519 Headache, unspecified: Secondary | ICD-10-CM | POA: Diagnosis not present

## 2019-11-26 LAB — CBC WITH DIFFERENTIAL/PLATELET
Abs Immature Granulocytes: 0.02 10*3/uL (ref 0.00–0.07)
Basophils Absolute: 0.1 10*3/uL (ref 0.0–0.1)
Basophils Relative: 1 %
Eosinophils Absolute: 0.1 10*3/uL (ref 0.0–0.5)
Eosinophils Relative: 1 %
HCT: 44.3 % (ref 36.0–46.0)
Hemoglobin: 14.7 g/dL (ref 12.0–15.0)
Immature Granulocytes: 0 %
Lymphocytes Relative: 26 %
Lymphs Abs: 2.5 10*3/uL (ref 0.7–4.0)
MCH: 29.1 pg (ref 26.0–34.0)
MCHC: 33.2 g/dL (ref 30.0–36.0)
MCV: 87.5 fL (ref 80.0–100.0)
Monocytes Absolute: 0.5 10*3/uL (ref 0.1–1.0)
Monocytes Relative: 5 %
Neutro Abs: 6.3 10*3/uL (ref 1.7–7.7)
Neutrophils Relative %: 67 %
Platelets: 301 10*3/uL (ref 150–400)
RBC: 5.06 MIL/uL (ref 3.87–5.11)
RDW: 12.1 % (ref 11.5–15.5)
WBC: 9.4 10*3/uL (ref 4.0–10.5)
nRBC: 0 % (ref 0.0–0.2)

## 2019-11-26 LAB — BASIC METABOLIC PANEL
Anion gap: 8 (ref 5–15)
BUN: 6 mg/dL (ref 6–20)
CO2: 25 mmol/L (ref 22–32)
Calcium: 9 mg/dL (ref 8.9–10.3)
Chloride: 103 mmol/L (ref 98–111)
Creatinine, Ser: 0.95 mg/dL (ref 0.44–1.00)
GFR calc Af Amer: >60 mL/min (ref >60)
GFR calc non Af Amer: >60 mL/min (ref >60)
Glucose, Bld: 112 mg/dL — ABNORMAL HIGH (ref 70–99)
Potassium: 4.8 mmol/L (ref 3.5–5.1)
Sodium: 136 mmol/L (ref 135–145)

## 2019-11-26 LAB — POC URINE PREG, ED: Preg Test, Ur: NEGATIVE

## 2019-11-26 IMAGING — CT CT HEAD W/O CM
4 series · 16 of 47 positions shown, 18 images · non-contrast
Comparison: None.

CLINICAL DATA: Left-sided numbness and tingling.

EXAM:
CT HEAD WITHOUT CONTRAST
TECHNIQUE: Contiguous axial images were obtained from the base of the skull
through the vertex without intravenous contrast.

[Series 3: head without · axial · non-contrast · 0.39mm/px · z∈[-108,-3]mm · 7 of 29 slices shown, 9 images]
[im 4/29  brain]
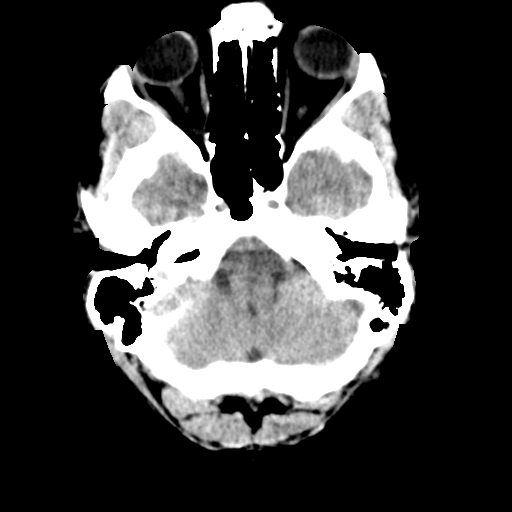
[im 4/29  bone]
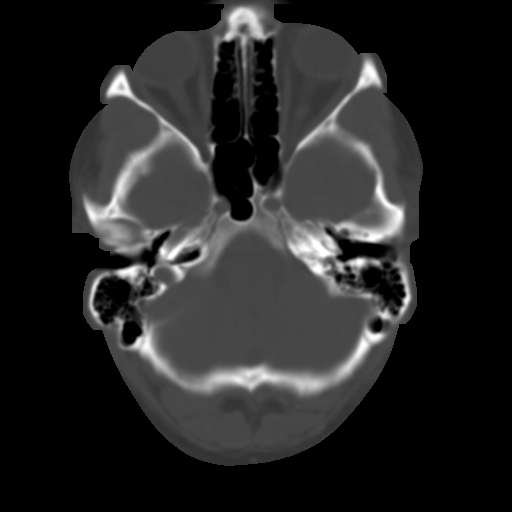
[im 8/29  brain]
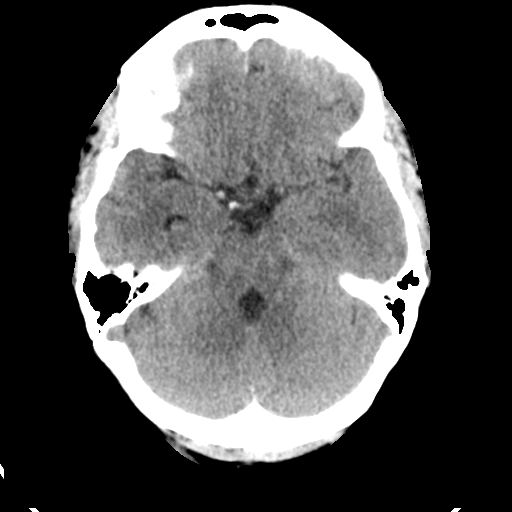
[im 11/29  brain]
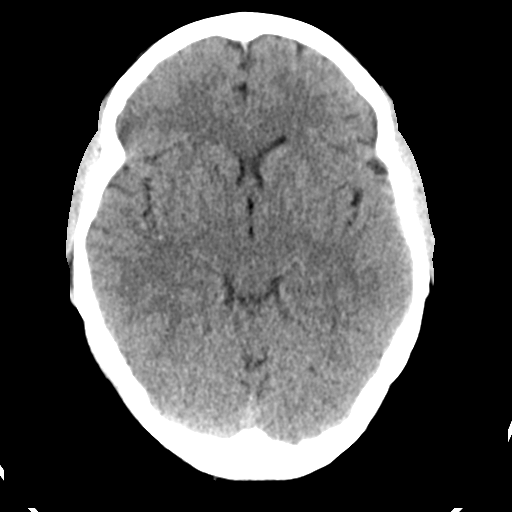
[im 15/29  brain]
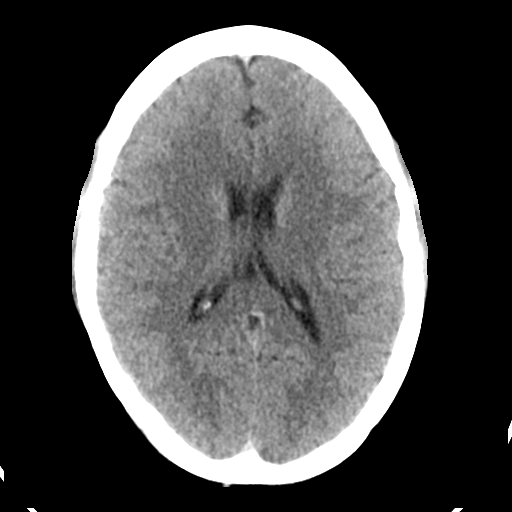
[im 18/29  brain]
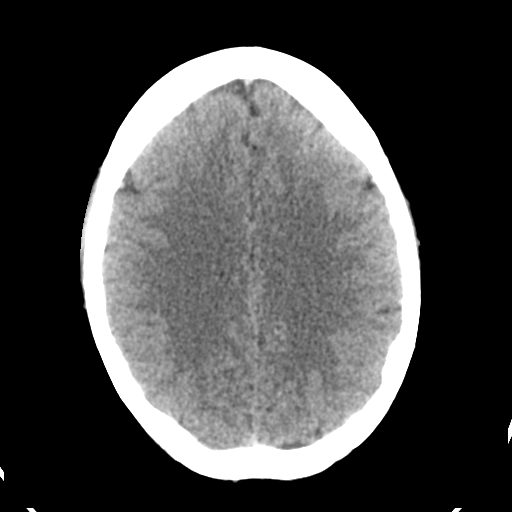
[im 18/29  bone]
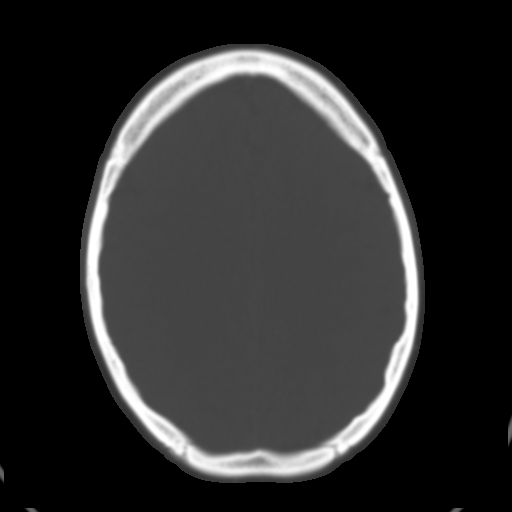
[im 22/29  brain]
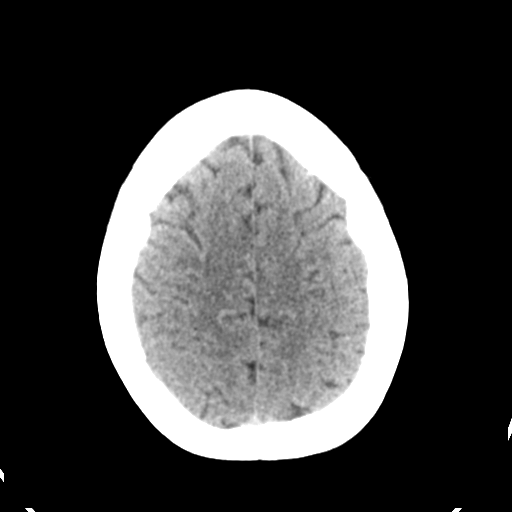
[im 25/29  brain]
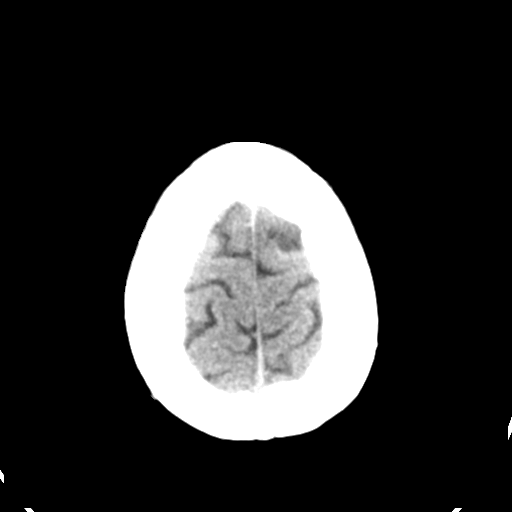

[Series 4: head bone · axial · 0.39mm/px · z∈[-109,-81]mm · 3 of 73 slices shown]
[im 8/73  bone]
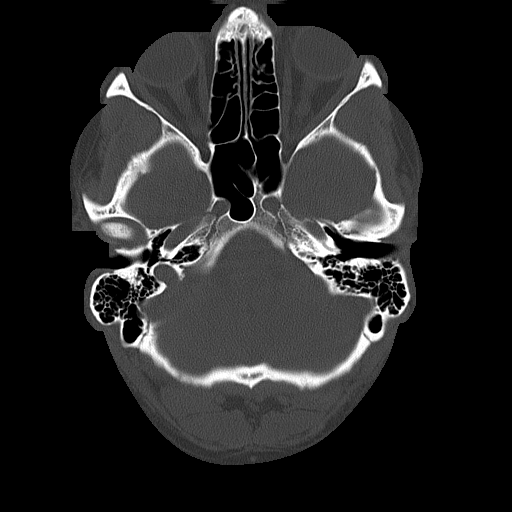
[im 15/73  bone]
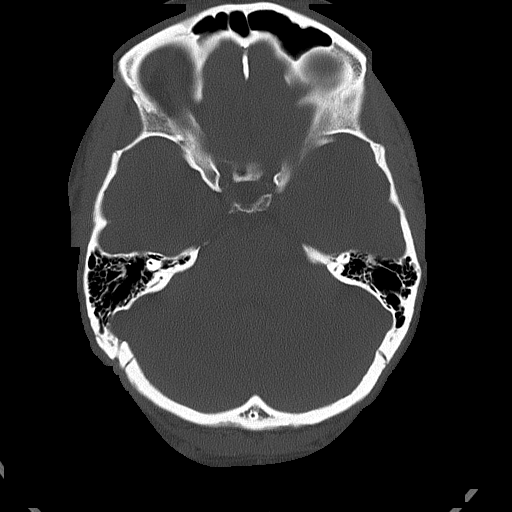
[im 22/73  bone]
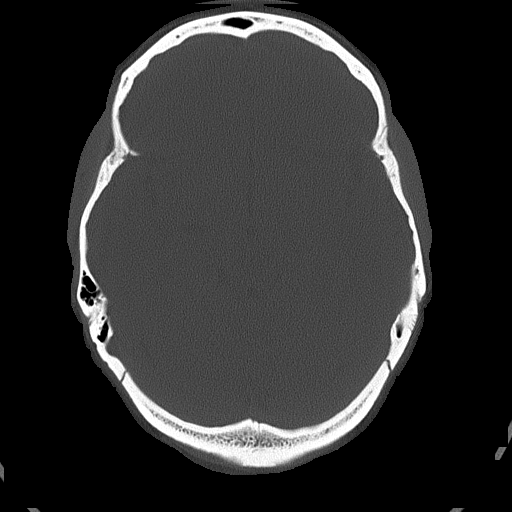

[Series 5: head without cor · coronal · non-contrast · 0.28mm/px · 3 of 67 slices shown]
[im 23/67  brain]
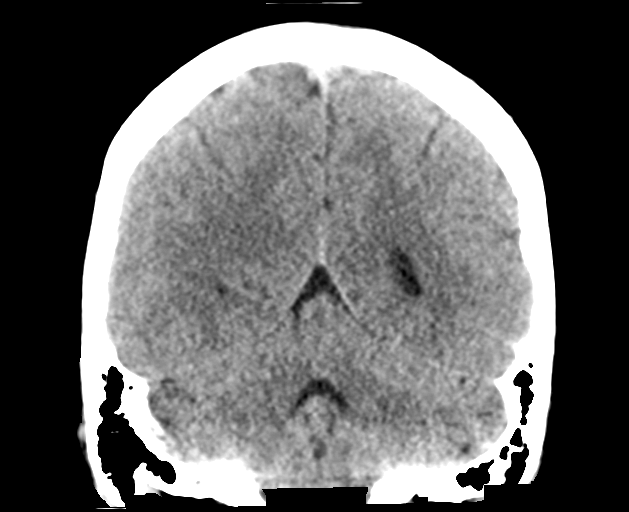
[im 30/67  brain]
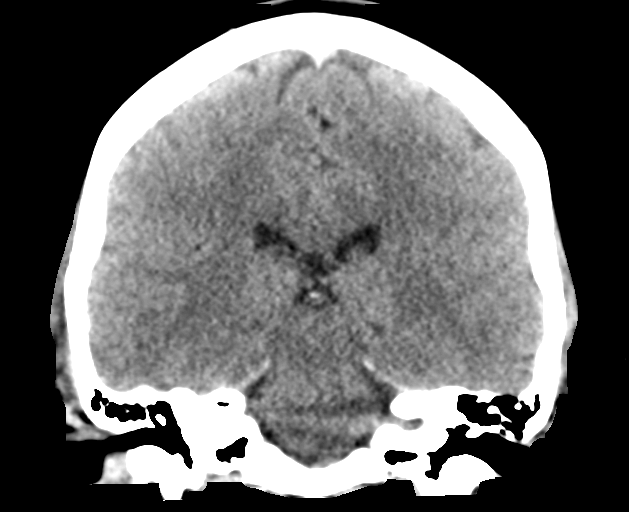
[im 37/67  brain]
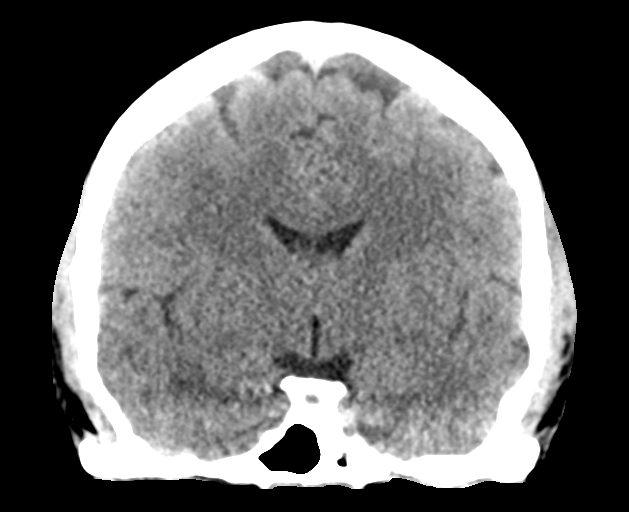

[Series 6: head without sag · sagittal · non-contrast · 0.28mm/px · 3 of 57 slices shown]
[im 19/57  brain]
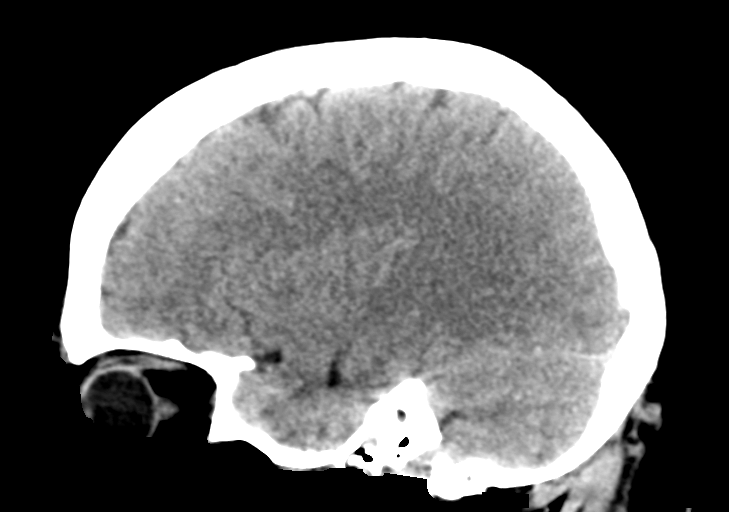
[im 29/57  brain]
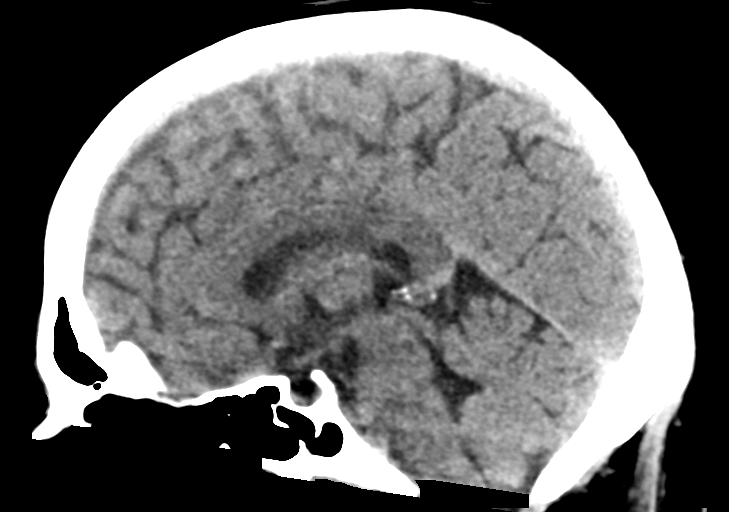
[im 38/57  brain]
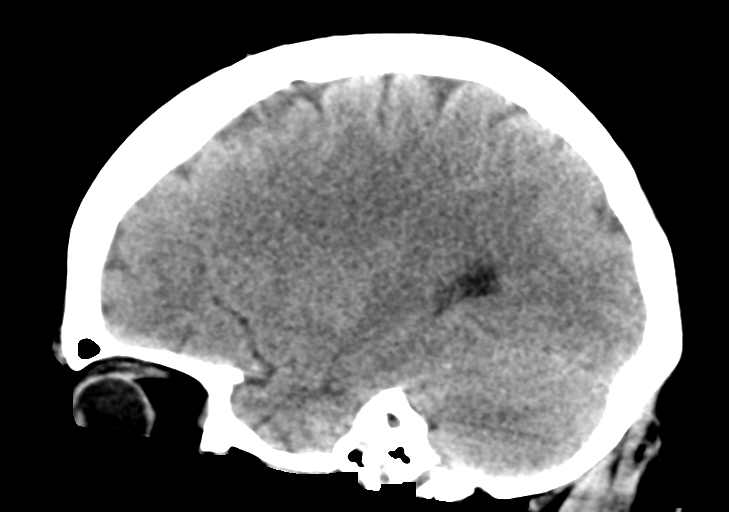

[16 of 47 positions shown; findings below may reference images not displayed]

FINDINGS: Brain: Gray-white differentiation is maintained. No CT evidence of
acute large territory infarct. No intraparenchymal or extra-axial
mass or hemorrhage. Normal size and configuration the ventricles and
the basilar cisterns. No midline shift.

Vascular: No hyperdense vessel or unexpected calcification.

Skull: No displaced calvarial fracture.

Sinuses/Orbits: Limited visualization of the paranasal sinuses and
mastoid air cells is normal. No air-fluid levels.

Other: Regional soft tissues appear normal.
IMPRESSION: Negative noncontrast head CT.

## 2019-11-26 MED ORDER — SODIUM CHLORIDE 0.9 % IV BOLUS
1000.0000 mL | Freq: Once | INTRAVENOUS | Status: AC
  Administered 2019-11-26: 10:00:00 1000 mL via INTRAVENOUS

## 2019-11-26 MED ORDER — METOCLOPRAMIDE HCL 5 MG/ML IJ SOLN
10.0000 mg | Freq: Once | INTRAMUSCULAR | Status: AC
  Administered 2019-11-26: 10:00:00 10 mg via INTRAVENOUS
  Filled 2019-11-26: qty 2

## 2019-11-26 MED ORDER — SODIUM CHLORIDE 0.9 % IV BOLUS
1000.0000 mL | Freq: Once | INTRAVENOUS | Status: AC
  Administered 2019-11-26: 12:00:00 1000 mL via INTRAVENOUS

## 2019-11-26 MED ORDER — PROCHLORPERAZINE EDISYLATE 10 MG/2ML IJ SOLN
10.0000 mg | Freq: Once | INTRAMUSCULAR | Status: AC
  Administered 2019-11-26: 12:00:00 10 mg via INTRAVENOUS
  Filled 2019-11-26: qty 2

## 2019-11-26 MED ORDER — KETOROLAC TROMETHAMINE 30 MG/ML IJ SOLN
30.0000 mg | Freq: Once | INTRAMUSCULAR | Status: AC
  Administered 2019-11-26: 12:00:00 30 mg via INTRAVENOUS
  Filled 2019-11-26: qty 1

## 2019-11-26 NOTE — ED Provider Notes (Signed)
[facility] EMERGENCY DEPARTMENT Provider Note   CSN: [id number] Arrival date & time: 11/26/19  [id number]     History Chief Complaint  Patient presents with  . Right side body tingling    Nyhla [name] is a 39 y.o. female with PMHx migraine HA, presenting to the ED with complaint of left sided arm and leg tingling/numbness that she noticed upon awakening this morning.  She states she woke up with a numbness and cold sensation to her left arm and leg.  She states she has since began feeling a tingling sensation like pins-and-needles.  She reports she has had a onset of headache over the last 3 days.  She has been treating it with ibuprofen at bedtime.  She states it is not quite a migraine though she does have history of chronic migraine, never had neuro symptoms associated.  Denies weakness to extremities, bowel or bladder incontinence, double or loss of vision, facial droop or slurred speech, gait instability. Patient is followed by [facility] neurology.  The history is provided by the patient.       Past Medical History:  Diagnosis Date  . Anxiety    self reported  . Depression    self reported  . Migraine   . No pertinent past medical history     Patient Active Problem List   Diagnosis Date Noted  . Attention deficit hyperactivity disorder (ADHD), predominantly hyperactive type 12/10/2017  . Insomnia 12/10/2017  . Chronic migraine without aura without status migrainosus, not intractable 03/05/2017    Past Surgical History:  Procedure Laterality Date  . CESAREAN SECTION  2013  . laparscopy    . SHOULDER CAPSULORRHAPHY       OB History    Gravida  2   Para      Term      Preterm      AB  1   Living        SAB  1   TAB      Ectopic      Multiple      Live Births              Family History  Problem Relation Age of Onset  . Heart disease Maternal Grandfather   . Cerebral aneurysm Sister     Social History   Tobacco Use  .  Smoking status: Never Smoker  . Smokeless tobacco: Never Used  Substance Use Topics  . Alcohol use: No  . Drug use: No    Home Medications Prior to Admission medications   Medication Sig Start Date End Date Taking? Authorizing Provider  amphetamine-dextroamphetamine (ADDERALL) 15 MG tablet Take 1 tablet by mouth 2 (two) times daily. 10/17/19   Lomax, Amy, NP  cyclobenzaprine (FLEXERIL) 5 MG tablet TAKE 1 TABLET BY MOUTH THREE TIMES A DAY AS NEEDED FOR MUSCLE SPASMS 08/01/19   Melvenia Beam, MD  Erenumab-aooe (AIMOVIG) 140 MG/ML SOAJ Inject 140 mg into the skin every 30 (thirty) days. 10/17/19   Lomax, Amy, NP  ibuprofen (ADVIL,MOTRIN) 600 MG tablet Take 1 tablet (600 mg total) by mouth every 6 (six) hours as needed for moderate pain. 11/26/16   Melony Overly, MD  nortriptyline (PAMELOR) 25 MG capsule TAKE 1 CAPSULE (25 MG TOTAL) BY MOUTH 2 (TWO) TIMES DAILY. 05/23/19   Melvenia Beam, MD  prochlorperazine (COMPAZINE) 10 MG tablet Take 1 tablet (10 mg total) by mouth every 6 (six) hours as needed for nausea or vomiting. 12/04/18  Melvenia Beam, MD  SUMAtriptan (IMITREX) 6 MG/0.5ML SOLN injection Inject 0.5 mLs (6 mg total) into the skin every 2 (two) hours as needed for migraine or headache. No more then 2 injections in 24hrs 04/28/19   Lomax, Amy, NP    Allergies    Tylenol [acetaminophen], Aspirin, Benadryl [diphenhydramine hcl], and Dilaudid [hydromorphone hcl]  Review of Systems   Review of Systems  Neurological: Positive for numbness and headaches.  All other systems reviewed and are negative.   Physical Exam Updated Vital Signs BP (!) 141/91 (BP Location: Right Arm)   Pulse 92   Temp 97.7 F (36.5 C) (Oral)   Resp 18   SpO2 100%   Physical Exam Vitals and nursing note reviewed.  Constitutional:      General: She is not in acute distress.    Appearance: She is well-developed. She is not ill-appearing.  HENT:     Head: Normocephalic and atraumatic.  Eyes:      Conjunctiva/sclera: Conjunctivae normal.  Cardiovascular:     Rate and Rhythm: Normal rate and regular rhythm.  Pulmonary:     Effort: Pulmonary effort is normal. No respiratory distress.     Breath sounds: Normal breath sounds.  Abdominal:     Palpations: Abdomen is soft.  Skin:    General: Skin is warm.  Neurological:     Mental Status: She is alert.     Comments: Mental Status:  Alert, oriented, thought content appropriate, able to give a coherent history. Speech fluent without evidence of aphasia. Able to follow 2 step commands without difficulty.  Cranial Nerves:  II:  Peripheral visual fields grossly normal, pupils equal, round, reactive to light III,IV, VI: ptosis not present, extra-ocular motions intact bilaterally  V,VII: smile symmetric, facial light touch sensation decreased on left in all 3 distributions VIII: hearing grossly normal to voice  X: uvula elevates symmetrically  XI: bilateral shoulder shrug symmetric and strong XII: midline tongue extension without fassiculations Motor:  Normal tone. 5/5 strength in upper and lower extremities bilaterally including strong and equal grip strength and dorsiflexion/plantar flexion Sensory: pt reports dec sensation along radial aspect of arm and lateral aspect of leg Cerebellar: normal finger-to-nose with bilateral upper extremities Gait: normal gait and balance CV: distal pulses palpable throughout   Psychiatric:        Behavior: Behavior normal.     ED Results / Procedures / Treatments   Labs (all labs ordered are listed, but only abnormal results are displayed) Labs Reviewed  BASIC METABOLIC PANEL - Abnormal; Notable for the following components:      Result Value   Glucose, Bld 112 (*)    All other components within normal limits  CBC WITH DIFFERENTIAL/PLATELET  POC URINE PREG, ED    EKG None  Radiology CT Head Wo Contrast  Result Date: 11/26/2019 CLINICAL DATA:  Left-sided numbness and tingling. EXAM: CT  HEAD WITHOUT CONTRAST TECHNIQUE: Contiguous axial images were obtained from the base of the skull through the vertex without intravenous contrast. COMPARISON:  None. FINDINGS: Brain: Gray-white differentiation is maintained. No CT evidence of acute large territory infarct. No intraparenchymal or extra-axial mass or hemorrhage. Normal size and configuration the ventricles and the basilar cisterns. No midline shift. Vascular: No hyperdense vessel or unexpected calcification. Skull: No displaced calvarial fracture. Sinuses/Orbits: Limited visualization of the paranasal sinuses and mastoid air cells is normal. No air-fluid levels. Other: Regional soft tissues appear normal. IMPRESSION: Negative noncontrast head CT. Electronically Signed   By: Jenny Reichmann  Watts M.D.   On: 11/26/2019 10:06    Procedures Procedures (including critical care time)  Medications Ordered in ED Medications  sodium chloride 0.9 % bolus 1,000 mL (1,000 mLs Intravenous New Bag/Given 11/26/19 1007)  metoCLOPramide (REGLAN) injection 10 mg (10 mg Intravenous Given 11/26/19 1007)  ketorolac (TORADOL) 30 MG/ML injection 30 mg (30 mg Intravenous Given 11/26/19 1137)  prochlorperazine (COMPAZINE) injection 10 mg (10 mg Intravenous Given 11/26/19 1138)  sodium chloride 0.9 % bolus 1,000 mL (1,000 mLs Intravenous New Bag/Given 11/26/19 1136)    ED Course  I have reviewed the triage vital signs and the nursing notes.  Pertinent labs & imaging results that were available during my care of the patient were reviewed by me and considered in my medical decision making (see chart for details).  Clinical Course as of Nov 25 1249  Sat Nov 26, 2019  1127 Consulted with Dr. Rory Percy. Recommends additional medications and re-evaluation. Likely complex migraine   [JR]  [id number] Patient reevaluated after additional medications.  Reports complete resolution in headache and paresthesias.  Normal sensation to all extremities.  Will discharge with instruction to follow-up  closely with her neurologist.  Likely complex migraine.  Patient instructed of return precautions and agreeable to plan.   [JR]    Clinical Course User Index [JR] Bevelyn Arriola, [place] N, PA-C   MDM Rules/Calculators/A&P                      Pt with hx of chronic migraine headache, presenting to the ED with numbness and tingling to left arm and leg that began this morning upon awakening.  No other neuro symptoms reported.  On exam, patient reports a decreased sensation to left face, left radial aspect of the arm and left lateral leg.  No other neuro deficits appreciated.  Patient discussed with Dr. Ronnald Nian.  Will obtain lab work, head CT and treat headache.  Will reevaluate.  On initial reevaluation, patient reports tingling is improving, however still has some decrease sensation to left lower extremity.  Consulted with neurologist, Dr.arora, who recommends additional medications for migraine.  Likely complex migraine, however if symptoms persist consider further imaging.  Compazine, additional fluids and Toradol given.  On subsequent evaluation, patient reports headache has resolved, tingling sensation has resolved, sensation is back to normal.  Discussed neurology consult and instructed to follow close with her neurologist outpatient.  She feels comfortable with this plan and is safe for discharge.  Discussed results, findings, treatment and follow up. Patient advised of return precautions. Patient verbalized understanding and agreed with plan.  Final Clinical Impression(s) / ED Diagnoses Final diagnoses:  Acute nonintractable headache, unspecified headache type  Paresthesia of left upper and lower extremity    Rx / DC Orders ED Discharge Orders    None       Ah Bott, [place] N, PA-C 11/26/19 Poneto, Wharton, DO 11/26/19 1342

## 2019-11-26 NOTE — ED Notes (Signed)
Patient transported to CT 

## 2019-11-26 NOTE — ED Triage Notes (Signed)
Patient reports tingling at left side of body ( legs/arms ) onset this morning , alert and oriented , respirations unlabored , No facial asymmetry/speech clear , no arm drift with equal grips .

## 2019-11-26 NOTE — ED Notes (Signed)
Patient Alert and oriented to baseline. Stable and ambulatory to baseline. Patient verbalized understanding of the discharge instructions.  Patient belongings were taken by the patient.   

## 2019-11-26 NOTE — Discharge Instructions (Addendum)
Schedule appointment with your neurologist to follow-up closely regarding your visit today. If your symptoms return or worsen in any way, please report to the emergency department for further evaluation.
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
## 2019-11-30 ENCOUNTER — Other Ambulatory Visit: Payer: Self-pay

## 2019-11-30 ENCOUNTER — Ambulatory Visit: Payer: BC Managed Care – PPO | Admitting: Family Medicine

## 2019-11-30 ENCOUNTER — Encounter: Payer: Self-pay | Admitting: Family Medicine

## 2019-11-30 VITALS — BP 110/78 | HR 90 | Temp 97.8°F | Ht 64.0 in | Wt 215.4 lb

## 2019-11-30 DIAGNOSIS — G43709 Chronic migraine without aura, not intractable, without status migrainosus: Secondary | ICD-10-CM

## 2019-11-30 DIAGNOSIS — F901 Attention-deficit hyperactivity disorder, predominantly hyperactive type: Secondary | ICD-10-CM | POA: Diagnosis not present

## 2019-11-30 DIAGNOSIS — M7918 Myalgia, other site: Secondary | ICD-10-CM

## 2019-11-30 DIAGNOSIS — F418 Other specified anxiety disorders: Secondary | ICD-10-CM | POA: Diagnosis not present

## 2019-11-30 NOTE — Patient Instructions (Signed)
Continue Amovig and nortriptyline as prescribed  Imitrex as needed for abortive therapy, please avoid regular use of any abortive medication including over the counter medications  Hydration, well balanced diet and regular exercise  We will refer you to psychiatry for concerns of anxiety and depression  Follow up with me in 6 months   Mindfulness-Based Stress Reduction Mindfulness-based stress reduction (MBSR) is a program that helps people learn to practice mindfulness. Mindfulness is the practice of intentionally paying attention to the present moment. It can be learned and practiced through techniques such as education, breathing exercises, meditation, and yoga. MBSR includes several mindfulness techniques in one program. MBSR works best when you understand the treatment, are willing to try new things, and can commit to spending time practicing what you learn. MBSR training may include learning about:  How your emotions, thoughts, and reactions affect your body.  New ways to respond to things that cause negative thoughts to start (triggers).  How to notice your thoughts and let go of them.  Practicing awareness of everyday things that you normally do without thinking.  The techniques and goals of different types of meditation. What are the benefits of MBSR? MBSR can have many benefits, which include helping you to:  Develop self-awareness. This refers to knowing and understanding yourself.  Learn skills and attitudes that help you to participate in your own health care.  Learn new ways to care for yourself.  Be more accepting about how things are, and let things go.  Be less judgmental and approach things with an open mind.  Be patient with yourself and trust yourself more. MBSR has also been shown to:  Reduce negative emotions, such as depression and anxiety.  Improve memory and focus.  Change how you sense and approach pain.  Boost your body's ability to fight  infections.  Help you connect better with other people.  Improve your sense of well-being. Follow these instructions at home:   Find a local in-person or online MBSR program.  Set aside some time regularly for mindfulness practice.  Find a mindfulness practice that works best for you. This may include one or more of the following: ? Meditation. Meditation involves focusing your mind on a certain thought or activity. ? Breathing awareness exercises. These help you to stay present by focusing on your breath. ? Body scan. For this practice, you lie down and pay attention to each part of your body from head to toe. You can identify tension and soreness and intentionally relax parts of your body. ? Yoga. Yoga involves stretching and breathing, and it can improve your ability to move and be flexible. It can also provide an experience of testing your body's limits, which can help you release stress. ? Mindful eating. This way of eating involves focusing on the taste, texture, color, and smell of each bite of food. Because this slows down eating and helps you feel full sooner, it can be an important part of a weight-loss plan.  Find a podcast or recording that provides guidance for breathing awareness, body scan, or meditation exercises. You can listen to these any time when you have a free moment to rest without distractions.  Follow your treatment plan as told by your health care provider. This may include taking regular medicines and making changes to your diet or lifestyle as recommended. How to practice mindfulness To do a basic awareness exercise:  Find a comfortable place to sit.  Pay attention to the present moment. Observe your  thoughts, feelings, and surroundings just as they are.  Avoid placing judgment on yourself, your feelings, or your surroundings. Make note of any judgment that comes up, and let it go.  Your mind may wander, and that is okay. Make note of when your thoughts  drift, and return your attention to the present moment. To do basic mindfulness meditation:  Find a comfortable place to sit. This may include a stable chair or a firm floor cushion. ? Sit upright with your back straight. Let your arms fall next to your side with your hands resting on your legs. ? If sitting in a chair, rest your feet flat on the floor. ? If sitting on a cushion, cross your legs in front of you.  Keep your head in a neutral position with your chin dropped slightly. Relax your jaw and rest the tip of your tongue on the roof of your mouth. Drop your gaze to the floor. You can close your eyes if you like.  Breathe normally and pay attention to your breath. Feel the air moving in and out of your nose. Feel your belly expanding and relaxing with each breath.  Your mind may wander, and that is okay. Make note of when your thoughts drift, and return your attention to your breath.  Avoid placing judgment on yourself, your feelings, or your surroundings. Make note of any judgment or feelings that come up, let them go, and bring your attention back to your breath.  When you are ready, lift your gaze or open your eyes. Pay attention to how your body feels after the meditation. Where to find more information You can find more information about MBSR from:  Your health care provider.  Community-based meditation centers or programs.  Programs offered near you. Summary  Mindfulness-based stress reduction (MBSR) is a program that teaches you how to intentionally pay attention to the present moment. It is used with other treatments to help you cope better with daily stress, emotions, and pain.  MBSR focuses on developing self-awareness, which allows you to respond to life stress without judgment or negative emotions.  MBSR programs may involve learning different mindfulness practices, such as breathing exercises, meditation, yoga, body scan, or mindful eating. Find a mindfulness  practice that works best for you, and set aside time for it on a regular basis. This information is not intended to replace advice given to you by your health care provider. Make sure you discuss any questions you have with your health care provider. Document Revised: 10/23/2017 Document Reviewed: 03/19/2017 Elsevier Patient Education  [place].   Analgesic Rebound Headache An analgesic rebound headache, sometimes called a medication overuse headache, is a headache that comes after pain medicine (analgesic) taken to treat the original (primary) headache has worn off. Any type of primary headache can return as a rebound headache if a person regularly takes analgesics more than three times a week to treat it. The types of primary headaches that are commonly associated with rebound headaches include:  Migraines.  Headaches that arise from tense muscles in the head and neck area (tension headaches).  Headaches that develop and happen again (recur) on one side of the head and around the eye (cluster headaches). If rebound headaches continue, they become chronic daily headaches. What are the causes? This condition may be caused by frequent use of:  Over-the-counter medicines such as aspirin, ibuprofen, and acetaminophen.  Sinus relief medicines and other medicines that contain caffeine.  Narcotic pain medicines such as  codeine and oxycodone. What are the signs or symptoms? The symptoms of a rebound headache are the same as the symptoms of the original headache. Some of the symptoms of specific types of headaches include: Migraine headache  Pulsing or throbbing pain on one or both sides of the head.  Severe pain that interferes with daily activities.  Pain that is worsened by physical activity.  Nausea, vomiting, or both.  Pain with exposure to bright light, loud noises, or strong smells.  General sensitivity to bright light, loud noises, or strong smells.  Visual  changes.  Numbness of one or both arms. Tension headache  Pressure around the head.  Dull, aching head pain.  Pain felt over the front and sides of the head.  Tenderness in the muscles of the head, neck, and shoulders. Cluster headache  Severe pain that begins in or around one eye or temple.  Redness and tearing in the eye on the same side as the pain.  Droopy or swollen eyelid.  One-sided head pain.  Nausea.  Runny nose.  Sweaty, pale facial skin.  Restlessness. How is this diagnosed? This condition is diagnosed by:  Reviewing your medical history. This includes the nature of your primary headaches.  Reviewing the types of pain medicines that you have been using to treat your headaches and how often you take them. How is this treated? This condition may be treated or managed by:  Discontinuing frequent use of the analgesic medicine. Doing this may worsen your headaches at first, but the pain should eventually become more manageable, less frequent, and less severe.  Seeing a headache specialist. He or she may be able to help you manage your headaches and help make sure there is not another cause of the headaches.  Using methods of stress relief, such as acupuncture, counseling, biofeedback, and massage. Talk with your health care provider about which methods might be good for you. Follow these instructions at home:  Take over-the-counter and prescription medicines only as told by your health care provider.  Stop the repeated use of pain medicine as told by your health care provider. Stopping can be difficult. Carefully follow instructions from your health care provider.  Avoid triggers that are known to cause your primary headaches.  Keep all follow-up visits as told by your health care provider. This is important. Contact a health care provider if:  You continue to experience headaches after following treatments that your health care provider recommended. Get  help right away if:  You develop new headache pain.  You develop headache pain that is different than what you have experienced in the past.  You develop numbness or tingling in your arms or legs.  You develop changes in your speech or vision. This information is not intended to replace advice given to you by your health care provider. Make sure you discuss any questions you have with your health care provider. Document Revised: 10/23/2017 Document Reviewed: 04/14/2016 Elsevier Patient Education  [place].    Migraine Headache A migraine headache is a very strong throbbing pain on one side or both sides of your head. This type of headache can also cause other symptoms. It can last from 4 hours to 3 days. Talk with your doctor about what things may bring on (trigger) this condition. What are the causes? The exact cause of this condition is not known. This condition may be triggered or caused by:  Drinking alcohol.  Smoking.  Taking medicines, such as: ? Medicine used  to treat chest pain (nitroglycerin). ? Birth control pills. ? Estrogen. ? Some blood pressure medicines.  Eating or drinking certain products.  Doing physical activity. Other things that may trigger a migraine headache include:  Having a menstrual period.  Pregnancy.  Hunger.  Stress.  Not getting enough sleep or getting too much sleep.  Weather changes.  Tiredness (fatigue). What increases the risk?  Being 56-53 years old.  Being female.  Having a family history of migraine headaches.  Being Caucasian.  Having depression or anxiety.  Being very overweight. What are the signs or symptoms?  A throbbing pain. This pain may: ? Happen in any area of the head, such as on one side or both sides. ? Make it hard to do daily activities. ? Get worse with physical activity. ? Get worse around bright lights or loud noises.  Other symptoms may include: ? Feeling sick to your stomach  (nauseous). ? Vomiting. ? Dizziness. ? Being sensitive to bright lights, loud noises, or smells.  Before you get a migraine headache, you may get warning signs (an aura). An aura may include: ? Seeing flashing lights or having blind spots. ? Seeing bright spots, halos, or zigzag lines. ? Having tunnel vision or blurred vision. ? Having numbness or a tingling feeling. ? Having trouble talking. ? Having weak muscles.  Some people have symptoms after a migraine headache (postdromal phase), such as: ? Tiredness. ? Trouble thinking (concentrating). How is this treated?  Taking medicines that: ? Relieve pain. ? Relieve the feeling of being sick to your stomach. ? Prevent migraine headaches.  Treatment may also include: ? Having acupuncture. ? Avoiding foods that bring on migraine headaches. ? Learning ways to control your body functions (biofeedback). ? Therapy to help you know and deal with negative thoughts (cognitive behavioral therapy). Follow these instructions at home: Medicines  Take over-the-counter and prescription medicines only as told by your doctor.  Ask your doctor if the medicine prescribed to you: ? Requires you to avoid driving or using heavy machinery. ? Can cause trouble pooping (constipation). You may need to take these steps to prevent or treat trouble pooping:  Drink enough fluid to keep your pee (urine) pale yellow.  Take over-the-counter or prescription medicines.  Eat foods that are high in fiber. These include beans, whole grains, and fresh fruits and vegetables.  Limit foods that are high in fat and sugar. These include fried or sweet foods. Lifestyle  Do not drink alcohol.  Do not use any products that contain nicotine or tobacco, such as cigarettes, e-cigarettes, and chewing tobacco. If you need help quitting, ask your doctor.  Get at least 8 hours of sleep every night.  Limit and deal with stress. General instructions      Keep a  journal to find out what may bring on your migraine headaches. For example, write down: ? What you eat and drink. ? How much sleep you get. ? Any change in what you eat or drink. ? Any change in your medicines.  If you have a migraine headache: ? Avoid things that make your symptoms worse, such as bright lights. ? It may help to lie down in a dark, quiet room. ? Do not drive or use heavy machinery. ? Ask your doctor what activities are safe for you.  Keep all follow-up visits as told by your doctor. This is important. Contact a doctor if:  You get a migraine headache that is different or worse than others you  have had.  You have more than 15 headache days in one month. Get help right away if:  Your migraine headache gets very bad.  Your migraine headache lasts longer than 72 hours.  You have a fever.  You have a stiff neck.  You have trouble seeing.  Your muscles feel weak or like you cannot control them.  You start to lose your balance a lot.  You start to have trouble walking.  You pass out (faint).  You have a seizure. Summary  A migraine headache is a very strong throbbing pain on one side or both sides of your head. These headaches can also cause other symptoms.  This condition may be treated with medicines and changes to your lifestyle.  Keep a journal to find out what may bring on your migraine headaches.  Contact a doctor if you get a migraine headache that is different or worse than others you have had.  Contact your doctor if you have more than 15 headache days in a month. This information is not intended to replace advice given to you by your health care provider. Make sure you discuss any questions you have with your health care provider. Document Revised: 03/04/2019 Document Reviewed: 12/23/2018 Elsevier Patient Education  [place].

## 2019-12-01 ENCOUNTER — Other Ambulatory Visit: Payer: Self-pay | Admitting: Neurology

## 2019-12-01 MED ORDER — PROCHLORPERAZINE MALEATE 10 MG PO TABS: 10.0000 mg | ORAL_TABLET | Freq: Four times a day (QID) | ORAL | 6 refills | Status: DC | PRN

## 2019-12-01 MED ORDER — AMPHETAMINE-DEXTROAMPHETAMINE 15 MG PO TABS: 15.0000 mg | ORAL_TABLET | Freq: Two times a day (BID) | ORAL | 0 refills | Status: DC

## 2019-12-01 NOTE — Addendum Note (Signed)
Addended by: [name] L on: 12/01/2019 02:59 PM   Modules accepted: Orders

## 2019-12-01 NOTE — Telephone Encounter (Signed)
Drug registry checked. Last fill adderall #[id number] 10-17-19.

## 2019-12-05 MED ORDER — AMPHETAMINE-DEXTROAMPHETAMINE 15 MG PO TABS: 15.0000 mg | ORAL_TABLET | Freq: Two times a day (BID) | ORAL | 0 refills | Status: DC

## 2019-12-05 NOTE — Addendum Note (Signed)
Addended by: [name] L on: 12/05/2019 04:36 PM   Modules accepted: Orders

## 2019-12-06 ENCOUNTER — Ambulatory Visit: Payer: BC Managed Care – PPO | Attending: Family Medicine

## 2019-12-06 ENCOUNTER — Other Ambulatory Visit: Payer: Self-pay

## 2019-12-06 DIAGNOSIS — M62838 Other muscle spasm: Secondary | ICD-10-CM | POA: Insufficient documentation

## 2019-12-06 DIAGNOSIS — R293 Abnormal posture: Secondary | ICD-10-CM | POA: Insufficient documentation

## 2019-12-06 DIAGNOSIS — M542 Cervicalgia: Secondary | ICD-10-CM | POA: Diagnosis not present

## 2019-12-06 NOTE — Patient Instructions (Signed)
Access Code: [email or web address]  URL: [email or web address]  Date: 12/06/2019  Prepared by: Tomma Rakers   Exercises Doorway Pec Stretch at 90 Degrees Abduction - 1 reps - 3 sets - 1x daily - 7x weekly Sternocleidomastoid Stretch - 1 reps - 3 sets - 1x daily - 7x weekly Hooklying Isometric Hip Abduction with Belt - 10 reps - 1 sets - Don't use a stretchy band. Use something solid. 10 seconds hold - 1x daily - 7x weekly Seated Scapular Retraction - 10 reps - 2 sets - 1x daily - 7x weekly
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
## 2019-12-29 ENCOUNTER — Telehealth: Payer: Self-pay

## 2019-12-29 NOTE — Telephone Encounter (Addendum)
Error. DWD  

## 2020-01-02 ENCOUNTER — Encounter: Payer: Self-pay | Admitting: Family Medicine

## 2020-01-02 NOTE — Telephone Encounter (Signed)
Looks like she started dry needling as well. I do not see any reason we can't continue plans to start Botox. Does she wish to continue process?

## 2020-01-03 ENCOUNTER — Ambulatory Visit: Payer: BC Managed Care – PPO | Admitting: Family Medicine

## 2020-01-03 ENCOUNTER — Encounter: Payer: Self-pay | Admitting: Family Medicine

## 2020-01-03 NOTE — Telephone Encounter (Signed)
Noted, patient wants to continue with Botox. DWD

## 2020-01-03 NOTE — Progress Notes (Deleted)
This is her first Botox procedure. She has discontinued Amovig as it was not effective. She continues nortriptyline daily and sumatriptan injections for abortive therapy. She is having daily headaches with 5-10 migrainous headache days per month.    Consent Form Botulism Toxin Injection For Chronic Migraine    Reviewed orally with patient, additionally signature is on file:  Botulism toxin has been approved by the Federal drug administration for treatment of chronic migraine. Botulism toxin does not cure chronic migraine and it may not be effective in some patients.  The administration of botulism toxin is accomplished by injecting a small amount of toxin into the muscles of the neck and head. Dosage must be titrated for each individual. Any benefits resulting from botulism toxin tend to wear off after 3 months with a repeat injection required if benefit is to be maintained. Injections are usually done every 3-4 months with maximum effect peak achieved by about 2 or 3 weeks. Botulism toxin is expensive and you should be sure of what costs you will incur resulting from the injection.  The side effects of botulism toxin use for chronic migraine may include:   -Transient, and usually mild, facial weakness with facial injections  -Transient, and usually mild, head or neck weakness with head/neck injections  -Reduction or loss of forehead facial animation due to forehead muscle weakness  -Eyelid drooping  -Dry eye  -Pain at the site of injection or bruising at the site of injection  -Double vision  -Potential unknown long term risks   Contraindications: You should not have Botox if you are pregnant, nursing, allergic to albumin, have an infection, skin condition, or muscle weakness at the site of the injection, or have myasthenia gravis, Lambert-Eaton syndrome, or ALS.  It is also possible that as with any injection, there may be an allergic reaction or no effect from the medication.  Reduced effectiveness after repeated injections is sometimes seen and rarely infection at the injection site may occur. All care will be taken to prevent these side effects. If therapy is given over a long time, atrophy and wasting in the muscle injected may occur. Occasionally the patient's become refractory to treatment because they develop antibodies to the toxin. In this event, therapy needs to be modified.  I have read the above information and consent to the administration of botulism toxin.    BOTOX PROCEDURE NOTE FOR MIGRAINE HEADACHE  Contraindications and precautions discussed with patient(above). Aseptic procedure was observed and patient tolerated procedure. Procedure performed by [name], FNP-C.   The condition has existed for more than 6 months, and pt does not have a diagnosis of ALS, Myasthenia Gravis or Lambert-Eaton Syndrome.  Risks and benefits of injections discussed and pt agrees to proceed with the procedure.  Written consent obtained  These injections are medically necessary. Pt  receives good benefits from these injections. These injections do not cause sedations or hallucinations which the oral therapies may cause.   Description of procedure:  The patient was placed in a sitting position. The standard protocol was used for Botox as follows, with 5 units of Botox injected at each site:  -Procerus muscle, midline injection  -Corrugator muscle, bilateral injection  -Frontalis muscle, bilateral injection, with 2 sites each side, medial injection was performed in the upper one third of the frontalis muscle, in the region vertical from the medial inferior edge of the superior orbital rim. The lateral injection was again in the upper one third of the forehead vertically above  the lateral limbus of the cornea, 1.5 cm lateral to the medial injection site.  -Temporalis muscle injection, 4 sites, bilaterally. The first injection was 3 cm above the tragus of the ear, second  injection site was 1.5 cm to 3 cm up from the first injection site in line with the tragus of the ear. The third injection site was 1.5-3 cm forward between the first 2 injection sites. The fourth injection site was 1.5 cm posterior to the second injection site. 5th site laterally in the temporalis  muscleat the level of the outer canthus.  -Occipitalis muscle injection, 3 sites, bilaterally. The first injection was done one half way between the occipital protuberance and the tip of the mastoid process behind the ear. The second injection site was done lateral and superior to the first, 1 fingerbreadth from the first injection. The third injection site was 1 fingerbreadth superiorly and medially from the first injection site.  -Cervical paraspinal muscle injection, 2 sites, bilateral knee first injection site was 1 cm from the midline of the cervical spine, 3 cm inferior to the lower border of the occipital protuberance. The second injection site was 1.5 cm superiorly and laterally to the first injection site.  -Trapezius muscle injection was performed at 3 sites, bilaterally. The first injection site was in the upper trapezius muscle halfway between the inflection point of the neck, and the acromion. The second injection site was one half way between the acromion and the first injection site. The third injection was done between the first injection site and the inflection point of the neck.   Will return for repeat injection in 3 months.   A total of 200 units of Botox was prepared, 155 units of Botox was injected as documented above, any Botox not injected was wasted. The patient tolerated the procedure well, there were no complications of the above procedure.

## 2020-01-04 ENCOUNTER — Telehealth: Payer: Self-pay | Admitting: Family Medicine

## 2020-01-04 NOTE — Telephone Encounter (Signed)
Pt called stating that she forgot yesterday was her appt and she is needing to r/s. Please advise.

## 2020-01-11 NOTE — Telephone Encounter (Signed)
I called and scheduled the patient. DWD  

## 2020-01-18 ENCOUNTER — Encounter: Payer: Self-pay | Admitting: Psychiatry

## 2020-01-18 ENCOUNTER — Other Ambulatory Visit: Payer: Self-pay

## 2020-01-18 ENCOUNTER — Ambulatory Visit (INDEPENDENT_AMBULATORY_CARE_PROVIDER_SITE_OTHER): Payer: BC Managed Care – PPO | Admitting: Psychiatry

## 2020-01-18 DIAGNOSIS — F39 Unspecified mood [affective] disorder: Secondary | ICD-10-CM | POA: Diagnosis not present

## 2020-01-18 DIAGNOSIS — S069X9A Unspecified intracranial injury with loss of consciousness of unspecified duration, initial encounter: Secondary | ICD-10-CM | POA: Insufficient documentation

## 2020-01-18 DIAGNOSIS — F0630 Mood disorder due to known physiological condition, unspecified: Secondary | ICD-10-CM | POA: Insufficient documentation

## 2020-01-18 DIAGNOSIS — S069X1S Unspecified intracranial injury with loss of consciousness of 30 minutes or less, sequela: Secondary | ICD-10-CM

## 2020-01-18 DIAGNOSIS — S069XAA Unspecified intracranial injury with loss of consciousness status unknown, initial encounter: Secondary | ICD-10-CM | POA: Insufficient documentation

## 2020-01-18 MED ORDER — NUEDEXTA 20-10 MG PO CAPS: 1.0000 | ORAL_CAPSULE | Freq: Two times a day (BID) | ORAL | 1 refills | Status: DC
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
## 2020-01-19 ENCOUNTER — Telehealth: Payer: Self-pay

## 2020-01-19 NOTE — Telephone Encounter (Signed)
submited the prior auth - pending review.

## 2020-01-19 NOTE — Telephone Encounter (Signed)
received a request for a prior auth for the nuedexta

## 2020-01-20 ENCOUNTER — Telehealth: Payer: Self-pay

## 2020-01-20 NOTE — Telephone Encounter (Signed)
[facility]  DENIED  NUEDEXTA (DEXTROMETHORPHAN-QUINIDINE) 20-10MG 

## 2020-02-06 NOTE — Progress Notes (Signed)
This is first Botox procedure. She has daily headaches with at least 10-15 migrainous days. She has tried and failed multiple preventatives and abortive medications. She continues nortriptyline 25mg  twice daily and sumatriptan injections as needed.    Consent Form Botulism Toxin Injection For Chronic Migraine    Reviewed orally with patient, additionally signature is on file:  Botulism toxin has been approved by the Federal drug administration for treatment of chronic migraine. Botulism toxin does not cure chronic migraine and it may not be effective in some patients.  The administration of botulism toxin is accomplished by injecting a small amount of toxin into the muscles of the neck and head. Dosage must be titrated for each individual. Any benefits resulting from botulism toxin tend to wear off after 3 months with a repeat injection required if benefit is to be maintained. Injections are usually done every 3-4 months with maximum effect peak achieved by about 2 or 3 weeks. Botulism toxin is expensive and you should be sure of what costs you will incur resulting from the injection.  The side effects of botulism toxin use for chronic migraine may include:   -Transient, and usually mild, facial weakness with facial injections  -Transient, and usually mild, head or neck weakness with head/neck injections  -Reduction or loss of forehead facial animation due to forehead muscle weakness  -Eyelid drooping  -Dry eye  -Pain at the site of injection or bruising at the site of injection  -Double vision  -Potential unknown long term risks   Contraindications: You should not have Botox if you are pregnant, nursing, allergic to albumin, have an infection, skin condition, or muscle weakness at the site of the injection, or have myasthenia gravis, Lambert-Eaton syndrome, or ALS.  It is also possible that as with any injection, there may be an allergic reaction or no effect from the medication.  Reduced effectiveness after repeated injections is sometimes seen and rarely infection at the injection site may occur. All care will be taken to prevent these side effects. If therapy is given over a long time, atrophy and wasting in the muscle injected may occur. Occasionally the patient's become refractory to treatment because they develop antibodies to the toxin. In this event, therapy needs to be modified.  I have read the above information and consent to the administration of botulism toxin.    BOTOX PROCEDURE NOTE FOR MIGRAINE HEADACHE  Contraindications and precautions discussed with patient(above). Aseptic procedure was observed and patient tolerated procedure. Procedure performed by [name], FNP-C.   The condition has existed for more than 6 months, and pt does not have a diagnosis of ALS, Myasthenia Gravis or Lambert-Eaton Syndrome.  Risks and benefits of injections discussed and pt agrees to proceed with the procedure.  Written consent obtained  These injections are medically necessary. Pt  receives good benefits from these injections. These injections do not cause sedations or hallucinations which the oral therapies may cause.   Description of procedure:  The patient was placed in a sitting position. The standard protocol was used for Botox as follows, with 5 units of Botox injected at each site:  -Procerus muscle, midline injection  -Corrugator muscle, bilateral injection  -Frontalis muscle, bilateral injection, with 2 sites each side, medial injection was performed in the upper one third of the frontalis muscle, in the region vertical from the medial inferior edge of the superior orbital rim. The lateral injection was again in the upper one third of the forehead vertically above  the lateral limbus of the cornea, 1.5 cm lateral to the medial injection site.  -Temporalis muscle injection, 4 sites, bilaterally. The first injection was 3 cm above the tragus of the ear, second  injection site was 1.5 cm to 3 cm up from the first injection site in line with the tragus of the ear. The third injection site was 1.5-3 cm forward between the first 2 injection sites. The fourth injection site was 1.5 cm posterior to the second injection site. 5th site laterally in the temporalis  muscleat the level of the outer canthus.  -Occipitalis muscle injection, 3 sites, bilaterally. The first injection was done one half way between the occipital protuberance and the tip of the mastoid process behind the ear. The second injection site was done lateral and superior to the first, 1 fingerbreadth from the first injection. The third injection site was 1 fingerbreadth superiorly and medially from the first injection site.  -Cervical paraspinal muscle injection, 2 sites, bilateral knee first injection site was 1 cm from the midline of the cervical spine, 3 cm inferior to the lower border of the occipital protuberance. The second injection site was 1.5 cm superiorly and laterally to the first injection site.  -Trapezius muscle injection was performed at 3 sites, bilaterally. The first injection site was in the upper trapezius muscle halfway between the inflection point of the neck, and the acromion. The second injection site was one half way between the acromion and the first injection site. The third injection was done between the first injection site and the inflection point of the neck.   Will return for repeat injection in 3 months.   A total of 200 units of Botox was prepared, 155 units of Botox was injected as documented above, any Botox not injected was wasted. The patient tolerated the procedure well, there were no complications of the above procedure.

## 2020-02-07 ENCOUNTER — Telehealth: Payer: Self-pay | Admitting: Family Medicine

## 2020-02-07 ENCOUNTER — Other Ambulatory Visit: Payer: Self-pay

## 2020-02-07 ENCOUNTER — Encounter: Payer: Self-pay | Admitting: Family Medicine

## 2020-02-07 ENCOUNTER — Ambulatory Visit (INDEPENDENT_AMBULATORY_CARE_PROVIDER_SITE_OTHER): Payer: BC Managed Care – PPO | Admitting: Family Medicine

## 2020-02-07 DIAGNOSIS — G43709 Chronic migraine without aura, not intractable, without status migrainosus: Secondary | ICD-10-CM

## 2020-02-07 MED ORDER — AMPHETAMINE-DEXTROAMPHETAMINE 15 MG PO TABS: 15.0000 mg | ORAL_TABLET | Freq: Two times a day (BID) | ORAL | 0 refills | Status: DC

## 2020-02-07 NOTE — Telephone Encounter (Signed)
Pt needing a 12 week botox appt

## 2020-02-07 NOTE — Telephone Encounter (Signed)
[id number] [facility] re: pt's amphetamine-dextroamphetamine (ADDERALL) 15 MG tablet When calling please use [phone]

## 2020-02-07 NOTE — Telephone Encounter (Signed)
[facility], spoke with Vaughan Basta who stated they need DEA for Dr Jaynee Eagles, supervising MD. Hanley Seamen her required information; she stated they will get Rx ready to mail to patient,  verbalized understanding, appreciation.

## 2020-02-07 NOTE — Progress Notes (Signed)
[place] [facility], [place]  [id number] LOT [id number] exp 10/23,  NACL NDC [id number] Exp 05-24-20,  LOT [id number]

## 2020-02-08 ENCOUNTER — Ambulatory Visit (INDEPENDENT_AMBULATORY_CARE_PROVIDER_SITE_OTHER): Payer: BC Managed Care – PPO | Admitting: Psychiatry

## 2020-02-08 ENCOUNTER — Encounter: Payer: Self-pay | Admitting: Psychiatry

## 2020-02-08 ENCOUNTER — Other Ambulatory Visit: Payer: Self-pay

## 2020-02-08 DIAGNOSIS — S069X1S Unspecified intracranial injury with loss of consciousness of 30 minutes or less, sequela: Secondary | ICD-10-CM

## 2020-02-08 DIAGNOSIS — F39 Unspecified mood [affective] disorder: Secondary | ICD-10-CM

## 2020-02-08 MED ORDER — NORTRIPTYLINE HCL 75 MG PO CAPS: 75.0000 mg | ORAL_CAPSULE | Freq: Every day | ORAL | 1 refills | Status: DC

## 2020-02-08 NOTE — Progress Notes (Signed)
[facility] MD OP Progress Note  I connected with  Debbie [name] on 02/08/20 by a video enabled telemedicine application and verified that I am speaking with the correct person using two identifiers.   I discussed the limitations of evaluation and management by telemedicine. The patient expressed understanding and agreed to proceed.    02/08/2020 11:07 AM Debbie [name]  MRN:  [id number]  Chief Complaint: "I could not tell any difference with Nuedexta."  HPI: Patient stated that she tried Nuedexta samples for 3 weeks and could not tell any difference.  She stated that her migraines and headaches have continued and she just received her first dose of Botox injection yesterday.  She stated that she still having a low-grade headache with tightness in her forehead muscles.  She informed that her neurologist has now prescribed her with gabapentin to see if that would help and she just started taking that yesterday as well. Patient reported that mood irritability is her main challenge right now.  She is hoping that Botox and gabapentin combination would help her with her headaches. Patient was offered the option of increasing her dose of Pamelor for maximal effect.  She was explained to take the whole dose together at bedtime due to its long half-life instead of taking it separately twice a day. She was agreeable to the dose being adjusted to 75 mg at bedtime.  Visit Diagnosis:    ICD-10-CM   1. Unspecified mood (affective) disorder (HCC)  F39   2. Traumatic brain injury, with loss of consciousness of 30 minutes or less, sequela ([place])  S06.9X1S     Past Psychiatric History: Depression, TBI, ADHD  Past Medical History:  Past Medical History:  Diagnosis Date  . Anxiety    self reported  . Depression    self reported  . Migraine   . No pertinent past medical history     Past Surgical History:  Procedure Laterality Date  . CESAREAN SECTION  2013  . laparscopy    . SHOULDER CAPSULORRHAPHY       Family Psychiatric History: Sister-migraines  Family History:  Family History  Problem Relation Age of Onset  . Heart disease Maternal Grandfather   . Cerebral aneurysm Sister   . Migraines Sister     Social History:  Social History   Socioeconomic History  . Marital status: Divorced    Spouse name: Not on file  . Number of children: 1  . Years of education: Not on file  . Highest education level: Bachelor's degree (e.g., BA, AB, BS)  Occupational History  . Not on file  Tobacco Use  . Smoking status: Never Smoker  . Smokeless tobacco: Never Used  Substance and Sexual Activity  . Alcohol use: No  . Drug use: No  . Sexual activity: Yes    Birth control/protection: I.U.D.  Other Topics Concern  . Not on file  Social History Narrative   Lives at home with her son & his father   Right handed   Drinks 1 cup of caffeine daily   Social Determinants of Health   Financial Resource Strain:   . Difficulty of Paying Living Expenses:   Food Insecurity:   . Worried About Charity fundraiser in the Last Year:   . Arboriculturist in the Last Year:   Transportation Needs:   . Film/video editor (Medical):   Marland Kitchen Lack of Transportation (Non-Medical):   Physical Activity:   . Days of Exercise per Week:   .  Minutes of Exercise per Session:   Stress:   . Feeling of Stress :   Social Connections:   . Frequency of Communication with Friends and Family:   . Frequency of Social Gatherings with Friends and Family:   . Attends Religious Services:   . Active Member of Clubs or Organizations:   . Attends Archivist Meetings:   Marland Kitchen Marital Status:     Allergies:  Allergies  Allergen Reactions  . Tylenol [Acetaminophen] Anaphylaxis  . Aspirin Other (See Comments)    Unknown childhood rxn  . Benadryl [Diphenhydramine Hcl] Itching  . Dilaudid [Hydromorphone Hcl] Itching    Pill only  . Penicillins     hives  . Prednisone     Swelling, hives    Metabolic  Disorder Labs: No results found for: HGBA1C, MPG No results found for: PROLACTIN No results found for: CHOL, TRIG, HDL, CHOLHDL, VLDL, LDLCALC No results found for: TSH  Therapeutic Level Labs: No results found for: LITHIUM No results found for: VALPROATE No components found for:  CBMZ  Current Medications: Current Outpatient Medications  Medication Sig Dispense Refill  . amphetamine-dextroamphetamine (ADDERALL) 15 MG tablet Take 1 tablet by mouth 2 (two) times daily. 60 tablet 0  . cyclobenzaprine (FLEXERIL) 5 MG tablet TAKE 1 TABLET BY MOUTH THREE TIMES A DAY AS NEEDED FOR MUSCLE SPASMS 30 tablet 6  . ibuprofen (ADVIL,MOTRIN) 600 MG tablet Take 1 tablet (600 mg total) by mouth every 6 (six) hours as needed for moderate pain. 30 tablet 0  . prochlorperazine (COMPAZINE) 10 MG tablet Take 1 tablet (10 mg total) by mouth every 6 (six) hours as needed for nausea or vomiting. 30 tablet 6  . SUMAtriptan (IMITREX) 6 MG/0.5ML SOLN injection Inject 0.5 mLs (6 mg total) into the skin every 2 (two) hours as needed for migraine or headache. No more then 2 injections in 24hrs 10 vial 1   No current facility-administered medications for this visit.      Psychiatric Specialty Exam: Review of Systems  There were no vitals taken for this visit.There is no height or weight on file to calculate BMI.  General Appearance: Fairly Groomed  Eye Contact:  Good  Speech:  Clear and Coherent and Normal Rate  Volume:  Normal  Mood:  Euthymic  Affect:  Congruent  Thought Process:  Goal Directed, Linear and Descriptions of Associations: Intact  Orientation:  Full (Time, Place, and Person)  Thought Content: Logical   Suicidal Thoughts:  No  Homicidal Thoughts:  No  Memory:  Recent;   Good Remote;   Fair  Judgement:  Fair  Insight:  Fair  Psychomotor Activity:  Normal  Concentration:  Concentration: Good and Attention Span: Good  Recall:  Good  Fund of Knowledge: Good  Language: Good  Akathisia:   Negative  Handed:  Right  AIMS (if indicated): not done  Assets:  Communication Skills Desire for Improvement Financial Resources/Insurance Housing  ADL's:  Intact  Cognition: WNL     Assessment and Plan: 39 year old female with history of chronic migraines, depression, ADHD, TBI, tension headaches now seen for follow-up.  She is still complaining of ongoing mood irritability, did not report any improvement with Nuedexta.  She was agreeable to try higher dose of Pamelor to see if that would help her irritability symptoms.  1. Unspecified mood (affective) disorder (HCC)  - nortriptyline (PAMELOR) 75 MG capsule; Take 1 capsule (75 mg total) by mouth at bedtime.  Dispense: 30 capsule; Refill: 1  2. Traumatic brain injury, with loss of consciousness of 30 minutes or less, sequela ([place])  F/up in 1 month.   [place] Crane, MD 02/08/2020, 11:07 AM

## 2020-03-07 ENCOUNTER — Ambulatory Visit (INDEPENDENT_AMBULATORY_CARE_PROVIDER_SITE_OTHER): Payer: BC Managed Care – PPO | Admitting: Psychiatry

## 2020-03-07 ENCOUNTER — Other Ambulatory Visit: Payer: Self-pay

## 2020-03-07 ENCOUNTER — Encounter: Payer: Self-pay | Admitting: Psychiatry

## 2020-03-07 DIAGNOSIS — F39 Unspecified mood [affective] disorder: Secondary | ICD-10-CM

## 2020-03-07 DIAGNOSIS — S069X1S Unspecified intracranial injury with loss of consciousness of 30 minutes or less, sequela: Secondary | ICD-10-CM

## 2020-03-07 MED ORDER — NORTRIPTYLINE HCL 75 MG PO CAPS: 75.0000 mg | ORAL_CAPSULE | Freq: Every day | ORAL | 1 refills | Status: DC

## 2020-03-07 NOTE — Progress Notes (Signed)
[facility] MD OP Progress Note  I connected with  Debbie [name] on 03/07/20 by phone and verified that I am speaking with the correct person using two identifiers.   I discussed the limitations of evaluation and management by phone. The patient expressed understanding and agreed to proceed.    03/07/2020 3:53 PM Debbie [name]  MRN:  [id number]  Chief Complaint: " I am okay."  HPI: Patient stated that her migraine headaches have improved after she received her first dose of Botox injection but she still continues to have tension type headaches.  She stated that she has been taking nortriptyline 75 mg at bedtime.  She has noticed that she is developed some numbness around her lips which is not too bothersome but she was wondering if nortriptyline can cause that.  Writer informed her that  that is not a common side effect associated with TCAs and maybe could be side effect secondary to Botox injection. Patient stated that she is dealing with a lot of stress due to her young son who has ADHD and ODD.  She stated that that is the big stressor in her life and she knows that contributes to her headaches.  She really wants to work through that as it is getting too much for her to manage. Writer recommended individual therapy to help her with coping skills.  Patient said she was agreeable to try that.  Visit Diagnosis:    ICD-10-CM   1. Unspecified mood (affective) disorder (HCC)  F39   2. Traumatic brain injury, with loss of consciousness of 30 minutes or less, sequela ([place])  S06.9X1S     Past Psychiatric History: Depression, TBI, ADHD  Past Medical History:  Past Medical History:  Diagnosis Date  . Anxiety    self reported  . Depression    self reported  . Migraine   . No pertinent past medical history     Past Surgical History:  Procedure Laterality Date  . CESAREAN SECTION  2013  . laparscopy    . SHOULDER CAPSULORRHAPHY      Family Psychiatric History: Sister-migraines  Family History:   Family History  Problem Relation Age of Onset  . Heart disease Maternal Grandfather   . Cerebral aneurysm Sister   . Migraines Sister     Social History:  Social History   Socioeconomic History  . Marital status: Divorced    Spouse name: Not on file  . Number of children: 1  . Years of education: Not on file  . Highest education level: Bachelor's degree (e.g., BA, AB, BS)  Occupational History  . Not on file  Tobacco Use  . Smoking status: Never Smoker  . Smokeless tobacco: Never Used  Substance and Sexual Activity  . Alcohol use: No  . Drug use: No  . Sexual activity: Yes    Birth control/protection: I.U.D.  Other Topics Concern  . Not on file  Social History Narrative   Lives at home with her son & his father   Right handed   Drinks 1 cup of caffeine daily   Social Determinants of Health   Financial Resource Strain:   . Difficulty of Paying Living Expenses:   Food Insecurity:   . Worried About Charity fundraiser in the Last Year:   . Arboriculturist in the Last Year:   Transportation Needs:   . Film/video editor (Medical):   Marland Kitchen Lack of Transportation (Non-Medical):   Physical Activity:   . Days of Exercise  per Week:   . Minutes of Exercise per Session:   Stress:   . Feeling of Stress :   Social Connections:   . Frequency of Communication with Friends and Family:   . Frequency of Social Gatherings with Friends and Family:   . Attends Religious Services:   . Active Member of Clubs or Organizations:   . Attends Archivist Meetings:   Marland Kitchen Marital Status:     Allergies:  Allergies  Allergen Reactions  . Tylenol [Acetaminophen] Anaphylaxis  . Aspirin Other (See Comments)    Unknown childhood rxn  . Benadryl [Diphenhydramine Hcl] Itching  . Dilaudid [Hydromorphone Hcl] Itching    Pill only  . Penicillins     hives  . Prednisone     Swelling, hives    Metabolic Disorder Labs: No results found for: HGBA1C, MPG No results found for:  PROLACTIN No results found for: CHOL, TRIG, HDL, CHOLHDL, VLDL, LDLCALC No results found for: TSH  Therapeutic Level Labs: No results found for: LITHIUM No results found for: VALPROATE No components found for:  CBMZ  Current Medications: Current Outpatient Medications  Medication Sig Dispense Refill  . amphetamine-dextroamphetamine (ADDERALL) 15 MG tablet Take 1 tablet by mouth 2 (two) times daily. 60 tablet 0  . cyclobenzaprine (FLEXERIL) 5 MG tablet TAKE 1 TABLET BY MOUTH THREE TIMES A DAY AS NEEDED FOR MUSCLE SPASMS 30 tablet 6  . ibuprofen (ADVIL,MOTRIN) 600 MG tablet Take 1 tablet (600 mg total) by mouth every 6 (six) hours as needed for moderate pain. 30 tablet 0  . nortriptyline (PAMELOR) 75 MG capsule Take 1 capsule (75 mg total) by mouth at bedtime. 30 capsule 1  . prochlorperazine (COMPAZINE) 10 MG tablet Take 1 tablet (10 mg total) by mouth every 6 (six) hours as needed for nausea or vomiting. 30 tablet 6  . SUMAtriptan (IMITREX) 6 MG/0.5ML SOLN injection Inject 0.5 mLs (6 mg total) into the skin every 2 (two) hours as needed for migraine or headache. No more then 2 injections in 24hrs 10 vial 1   No current facility-administered medications for this visit.      Psychiatric Specialty Exam: Review of Systems  There were no vitals taken for this visit.There is no height or weight on file to calculate BMI.  General Appearance: Fairly Groomed  Eye Contact:  Good  Speech:  Clear and Coherent and Normal Rate  Volume:  Normal  Mood:  Euthymic  Affect:  Congruent  Thought Process:  Goal Directed, Linear and Descriptions of Associations: Intact  Orientation:  Full (Time, Place, and Person)  Thought Content: Logical   Suicidal Thoughts:  No  Homicidal Thoughts:  No  Memory:  Recent;   Good Remote;   Fair  Judgement:  Fair  Insight:  Fair  Psychomotor Activity:  Normal  Concentration:  Concentration: Good and Attention Span: Good  Recall:  Good  Fund of Knowledge: Good   Language: Good  Akathisia:  Negative  Handed:  Right  AIMS (if indicated): not done  Assets:  Communication Skills Desire for Improvement Financial Resources/Insurance Housing  ADL's:  Intact  Cognition: WNL     Assessment and Plan: 39 year old female with history of chronic migraines, depression, ADHD, TBI, tension headaches now seen for follow-up.  She reported improvement in migraine headaches after she received Botox last month however continues to have tension headaches.  She reported having a lot of stress dealing with her young son.  She was recommended individual therapy.   1.  Unspecified mood (affective) disorder (HCC)  -Continue nortriptyline (PAMELOR) 75 MG capsule; Take 1 capsule (75 mg total) by mouth at bedtime.  Dispense: 30 capsule; Refill: 1  2. Traumatic brain injury, with loss of consciousness of 30 minutes or less, sequela (HCC)  F/up in 2 months. Staff provided her with contact information for therapist and community.   Nevada Crane, MD 03/07/2020, 3:53 PM

## 2020-03-14 ENCOUNTER — Encounter: Payer: Self-pay | Admitting: Family Medicine

## 2020-03-14 MED ORDER — AMPHETAMINE-DEXTROAMPHETAMINE 15 MG PO TABS: 15.0000 mg | ORAL_TABLET | Freq: Two times a day (BID) | ORAL | 0 refills | Status: DC

## 2020-04-14 ENCOUNTER — Encounter: Payer: Self-pay | Admitting: Family Medicine

## 2020-04-16 ENCOUNTER — Other Ambulatory Visit: Payer: Self-pay | Admitting: *Deleted

## 2020-04-16 MED ORDER — AMPHETAMINE-DEXTROAMPHETAMINE 15 MG PO TABS: 15.0000 mg | ORAL_TABLET | Freq: Two times a day (BID) | ORAL | 0 refills | Status: DC

## 2020-04-16 NOTE — Telephone Encounter (Signed)
Per [place] registry, pt last filled on 03/14/2020 Dextroamp-Amphetamin 15 Mg Tab #60.00 for 30 day supply. Sent refill to Amy NP to e-scribe.

## 2020-04-18 ENCOUNTER — Telehealth: Payer: Self-pay | Admitting: Family Medicine

## 2020-04-18 NOTE — Telephone Encounter (Signed)
Checked [facility] portal to check patient's PA status. PA on file from [facility], good from 12/29/19-06/26/20. PA [id number]. Patient is B/B.

## 2020-04-30 ENCOUNTER — Other Ambulatory Visit: Payer: Self-pay | Admitting: Family Medicine

## 2020-04-30 ENCOUNTER — Encounter: Payer: Self-pay | Admitting: Family Medicine

## 2020-04-30 MED ORDER — NURTEC 75 MG PO TBDP: 75.0000 mg | ORAL_TABLET | Freq: Every day | ORAL | 11 refills | Status: DC | PRN

## 2020-05-01 ENCOUNTER — Ambulatory Visit: Payer: BC Managed Care – PPO | Admitting: Family Medicine

## 2020-05-01 NOTE — Telephone Encounter (Signed)
Pt is asking for a call to discuss r/s her Botox

## 2020-05-01 NOTE — Telephone Encounter (Signed)
Called patient and got her rescheduled for 05/17/20.

## 2020-05-02 ENCOUNTER — Ambulatory Visit: Payer: BC Managed Care – PPO | Admitting: Family Medicine

## 2020-05-02 ENCOUNTER — Encounter: Payer: Self-pay | Admitting: Family Medicine

## 2020-05-02 ENCOUNTER — Other Ambulatory Visit: Payer: Self-pay

## 2020-05-02 DIAGNOSIS — G43709 Chronic migraine without aura, not intractable, without status migrainosus: Secondary | ICD-10-CM | POA: Diagnosis not present

## 2020-05-02 NOTE — Telephone Encounter (Signed)
Can you schedule patient for 12:45 on Amy's schedule today? There is a hold on it and will not allow me to take it off.

## 2020-05-02 NOTE — Progress Notes (Signed)
Botox-100unitsx2 vials Lot: [id number] Expiration: 08/2022 NDC: 8367-2550-01   0.9% Sodium Chloride- 47mL total Lot: [id number] Expiration: 08/2022 NDC: 95583-167-42  Dx: Migraine B/B   Consent signed

## 2020-05-02 NOTE — Progress Notes (Signed)
This is her second treatment. She does report significant reduction in intensity and frequency of migraines with first treatment. She does report headaches have returned of the past week. She took Nurtec last night and it seemed to really help. She is working with psychiatry. She continues nortriptyline 75mg  at bedtime. She also continues Adderall 15mg  daily. I am not certain that this is contributing to headaches as she has taken it for a while. She would like to consider other options with psychiatry.    Consent Form Botulism Toxin Injection For Chronic Migraine    Reviewed orally with patient, additionally signature is on file:  Botulism toxin has been approved by the Federal drug administration for treatment of chronic migraine. Botulism toxin does not cure chronic migraine and it may not be effective in some patients.  The administration of botulism toxin is accomplished by injecting a small amount of toxin into the muscles of the neck and head. Dosage must be titrated for each individual. Any benefits resulting from botulism toxin tend to wear off after 3 months with a repeat injection required if benefit is to be maintained. Injections are usually done every 3-4 months with maximum effect peak achieved by about 2 or 3 weeks. Botulism toxin is expensive and you should be sure of what costs you will incur resulting from the injection.  The side effects of botulism toxin use for chronic migraine may include:   -Transient, and usually mild, facial weakness with facial injections  -Transient, and usually mild, head or neck weakness with head/neck injections  -Reduction or loss of forehead facial animation due to forehead muscle weakness  -Eyelid drooping  -Dry eye  -Pain at the site of injection or bruising at the site of injection  -Double vision  -Potential unknown long term risks   Contraindications: You should not have Botox if you are pregnant, nursing, allergic to albumin, have  an infection, skin condition, or muscle weakness at the site of the injection, or have myasthenia gravis, Lambert-Eaton syndrome, or ALS.  It is also possible that as with any injection, there may be an allergic reaction or no effect from the medication. Reduced effectiveness after repeated injections is sometimes seen and rarely infection at the injection site may occur. All care will be taken to prevent these side effects. If therapy is given over a long time, atrophy and wasting in the muscle injected may occur. Occasionally the patient's become refractory to treatment because they develop antibodies to the toxin. In this event, therapy needs to be modified.  I have read the above information and consent to the administration of botulism toxin.    BOTOX PROCEDURE NOTE FOR MIGRAINE HEADACHE  Contraindications and precautions discussed with patient(above). Aseptic procedure was observed and patient tolerated procedure. Procedure performed by [name], FNP-C.   The condition has existed for more than 6 months, and pt does not have a diagnosis of ALS, Myasthenia Gravis or Lambert-Eaton Syndrome.  Risks and benefits of injections discussed and pt agrees to proceed with the procedure.  Written consent obtained  These injections are medically necessary. Pt  receives good benefits from these injections. These injections do not cause sedations or hallucinations which the oral therapies may cause.   Description of procedure:  The patient was placed in a sitting position. The standard protocol was used for Botox as follows, with 5 units of Botox injected at each site:  -Procerus muscle, midline injection  -Corrugator muscle, bilateral injection  -Frontalis muscle, bilateral injection,  with 2 sites each side, medial injection was performed in the upper one third of the frontalis muscle, in the region vertical from the medial inferior edge of the superior orbital rim. The lateral injection was again  in the upper one third of the forehead vertically above the lateral limbus of the cornea, 1.5 cm lateral to the medial injection site.  -Temporalis muscle injection, 4 sites, bilaterally. The first injection was 3 cm above the tragus of the ear, second injection site was 1.5 cm to 3 cm up from the first injection site in line with the tragus of the ear. The third injection site was 1.5-3 cm forward between the first 2 injection sites. The fourth injection site was 1.5 cm posterior to the second injection site. 5th site laterally in the temporalis  muscleat the level of the outer canthus.  -Occipitalis muscle injection, 3 sites, bilaterally. The first injection was done one half way between the occipital protuberance and the tip of the mastoid process behind the ear. The second injection site was done lateral and superior to the first, 1 fingerbreadth from the first injection. The third injection site was 1 fingerbreadth superiorly and medially from the first injection site.  -Cervical paraspinal muscle injection, 2 sites, bilaterally. The first injection site was 1 cm from the midline of the cervical spine, 3 cm inferior to the lower border of the occipital protuberance. The second injection site was 1.5 cm superiorly and laterally to the first injection site.  -Trapezius muscle injection was performed at 3 sites, bilaterally. The first injection site was in the upper trapezius muscle halfway between the inflection point of the neck, and the acromion. The second injection site was one half way between the acromion and the first injection site. The third injection was done between the first injection site and the inflection point of the neck.   Will return for repeat injection in 3 months.   A total of 200 units of Botox was prepared, 155 units of Botox was injected as documented above, any Botox not injected was wasted. The patient tolerated the procedure well, there were no complications of the above  procedure.

## 2020-05-02 NOTE — Telephone Encounter (Signed)
Call from patient asking for writer to put a note in to inform Dr Toy Care her neurologist would like Dr Toy Care to consider reducing or discontinuing patients Adderall because it is known to cause or trigger migraines. She had forgotten to tell Dr Toy Care that at her initial appt. States her poor memory is one of her concerns as well. She has an appt with Dr Toy Care on 06/03/20

## 2020-05-04 ENCOUNTER — Telehealth: Payer: BC Managed Care – PPO | Admitting: Psychiatry

## 2020-05-04 ENCOUNTER — Telehealth (INDEPENDENT_AMBULATORY_CARE_PROVIDER_SITE_OTHER): Payer: BC Managed Care – PPO | Admitting: Psychiatry

## 2020-05-04 ENCOUNTER — Encounter (HOSPITAL_COMMUNITY): Payer: Self-pay | Admitting: Psychiatry

## 2020-05-04 ENCOUNTER — Other Ambulatory Visit: Payer: Self-pay

## 2020-05-04 DIAGNOSIS — F39 Unspecified mood [affective] disorder: Secondary | ICD-10-CM | POA: Diagnosis not present

## 2020-05-04 DIAGNOSIS — S069X1S Unspecified intracranial injury with loss of consciousness of 30 minutes or less, sequela: Secondary | ICD-10-CM | POA: Diagnosis not present

## 2020-05-04 DIAGNOSIS — F900 Attention-deficit hyperactivity disorder, predominantly inattentive type: Secondary | ICD-10-CM | POA: Diagnosis not present

## 2020-05-04 DIAGNOSIS — G43709 Chronic migraine without aura, not intractable, without status migrainosus: Secondary | ICD-10-CM | POA: Diagnosis not present

## 2020-05-04 MED ORDER — AMPHETAMINE-DEXTROAMPHETAMINE 15 MG PO TABS: 15.0000 mg | ORAL_TABLET | Freq: Two times a day (BID) | ORAL | 0 refills | Status: DC

## 2020-05-04 MED ORDER — NORTRIPTYLINE HCL 75 MG PO CAPS: 75.0000 mg | ORAL_CAPSULE | Freq: Every day | ORAL | 0 refills | Status: DC
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
## 2020-05-14 ENCOUNTER — Ambulatory Visit: Payer: BC Managed Care – PPO | Admitting: Family Medicine

## 2020-05-17 ENCOUNTER — Ambulatory Visit: Payer: BC Managed Care – PPO | Admitting: Family Medicine

## 2020-05-26 ENCOUNTER — Encounter: Payer: Self-pay | Admitting: Family Medicine

## 2020-05-29 ENCOUNTER — Telehealth: Payer: Self-pay | Admitting: Family Medicine

## 2020-05-29 NOTE — Telephone Encounter (Signed)
PA has been started on [email or web address]. Key is [id number]. Per CMM, determination will be made within 24-72 hours. Will update when available.

## 2020-05-30 ENCOUNTER — Encounter: Payer: Self-pay | Admitting: Family Medicine

## 2020-05-30 ENCOUNTER — Ambulatory Visit: Payer: BC Managed Care – PPO | Admitting: Family Medicine

## 2020-05-30 VITALS — BP 113/81 | HR 85 | Ht 63.0 in | Wt 216.0 lb

## 2020-05-30 DIAGNOSIS — G43709 Chronic migraine without aura, not intractable, without status migrainosus: Secondary | ICD-10-CM

## 2020-05-30 NOTE — Telephone Encounter (Signed)
Absolutely!!! Please let me know if you need anything.
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
## 2020-05-30 NOTE — Patient Instructions (Addendum)
We will continue Botox every 12 weeks. Continue close follow up with psychiatry and PCP.   Stay well hydrated.   Work on healthy lifestyle habits. Well balanced diet and regular exercise daily.   We will follow up yearly for office visit and every 12 weeks for Botox   Migraine Headache A migraine headache is a very strong throbbing pain on one side or both sides of your head. This type of headache can also cause other symptoms. It can last from 4 hours to 3 days. Talk with your doctor about what things may bring on (trigger) this condition. What are the causes? The exact cause of this condition is not known. This condition may be triggered or caused by:  Drinking alcohol.  Smoking.  Taking medicines, such as: ? Medicine used to treat chest pain (nitroglycerin). ? Birth control pills. ? Estrogen. ? Some blood pressure medicines.  Eating or drinking certain products.  Doing physical activity. Other things that may trigger a migraine headache include:  Having a menstrual period.  Pregnancy.  Hunger.  Stress.  Not getting enough sleep or getting too much sleep.  Weather changes.  Tiredness (fatigue). What increases the risk?  Being 57-26 years old.  Being female.  Having a family history of migraine headaches.  Being Caucasian.  Having depression or anxiety.  Being very overweight. What are the signs or symptoms?  A throbbing pain. This pain may: ? Happen in any area of the head, such as on one side or both sides. ? Make it hard to do daily activities. ? Get worse with physical activity. ? Get worse around bright lights or loud noises.  Other symptoms may include: ? Feeling sick to your stomach (nauseous). ? Vomiting. ? Dizziness. ? Being sensitive to bright lights, loud noises, or smells.  Before you get a migraine headache, you may get warning signs (an aura). An aura may include: ? Seeing flashing lights or having blind spots. ? Seeing bright  spots, halos, or zigzag lines. ? Having tunnel vision or blurred vision. ? Having numbness or a tingling feeling. ? Having trouble talking. ? Having weak muscles.  Some people have symptoms after a migraine headache (postdromal phase), such as: ? Tiredness. ? Trouble thinking (concentrating). How is this treated?  Taking medicines that: ? Relieve pain. ? Relieve the feeling of being sick to your stomach. ? Prevent migraine headaches.  Treatment may also include: ? Having acupuncture. ? Avoiding foods that bring on migraine headaches. ? Learning ways to control your body functions (biofeedback). ? Therapy to help you know and deal with negative thoughts (cognitive behavioral therapy). Follow these instructions at home: Medicines  Take over-the-counter and prescription medicines only as told by your doctor.  Ask your doctor if the medicine prescribed to you: ? Requires you to avoid driving or using heavy machinery. ? Can cause trouble pooping (constipation). You may need to take these steps to prevent or treat trouble pooping:  Drink enough fluid to keep your pee (urine) pale yellow.  Take over-the-counter or prescription medicines.  Eat foods that are high in fiber. These include beans, whole grains, and fresh fruits and vegetables.  Limit foods that are high in fat and sugar. These include fried or sweet foods. Lifestyle  Do not drink alcohol.  Do not use any products that contain nicotine or tobacco, such as cigarettes, e-cigarettes, and chewing tobacco. If you need help quitting, ask your doctor.  Get at least 8 hours of sleep every night.  Limit and deal with stress. General instructions      Keep a journal to find out what may bring on your migraine headaches. For example, write down: ? What you eat and drink. ? How much sleep you get. ? Any change in what you eat or drink. ? Any change in your medicines.  If you have a migraine headache: ? Avoid things  that make your symptoms worse, such as bright lights. ? It may help to lie down in a dark, quiet room. ? Do not drive or use heavy machinery. ? Ask your doctor what activities are safe for you.  Keep all follow-up visits as told by your doctor. This is important. Contact a doctor if:  You get a migraine headache that is different or worse than others you have had.  You have more than 15 headache days in one month. Get help right away if:  Your migraine headache gets very bad.  Your migraine headache lasts longer than 72 hours.  You have a fever.  You have a stiff neck.  You have trouble seeing.  Your muscles feel weak or like you cannot control them.  You start to lose your balance a lot.  You start to have trouble walking.  You pass out (faint).  You have a seizure. Summary  A migraine headache is a very strong throbbing pain on one side or both sides of your head. These headaches can also cause other symptoms.  This condition may be treated with medicines and changes to your lifestyle.  Keep a journal to find out what may bring on your migraine headaches.  Contact a doctor if you get a migraine headache that is different or worse than others you have had.  Contact your doctor if you have more than 15 headache days in a month. This information is not intended to replace advice given to you by your health care provider. Make sure you discuss any questions you have with your health care provider. Document Revised: 03/04/2019 Document Reviewed: 12/23/2018 Elsevier Patient Education  [place].

## 2020-05-30 NOTE — Telephone Encounter (Signed)
Appeal letter has been created and faxed back to [facility].

## 2020-05-30 NOTE — Telephone Encounter (Signed)
Received PA determination. Per [facility], PA has been denied due to them having information that she is on another oral CGRP medication. I viewed her chart and the only current one on file is the Nurtec.   Debbie [name], are you ok with me appealing this decision? I have the forms at my desk for the appeal information. Thanks!

## 2020-06-06 NOTE — Telephone Encounter (Signed)
PA for Nurtec has been approved from 06/06/2020-06/06/2021. Pharmacy is aware of approval.   Nothing further needed at time of call.

## 2020-06-11 ENCOUNTER — Telehealth: Payer: Self-pay | Admitting: Family Medicine

## 2020-06-11 MED ORDER — BOTOX 200 UNITS IJ SOLR: 155.0000 [IU] | INTRAMUSCULAR | 3 refills | Status: DC

## 2020-06-11 NOTE — Telephone Encounter (Signed)
Patient has her next Botox injection on 9/9. Her [employer] PA expires on 8/3. I filled out new PA request for Botox and will give to Amy NP to sign.   Debbie [name], I am going to try to get patient set up with [facility]. ([place], [place]) Phone [phone]. Fax [phone]. Can you send prescription for Botox over to them? She needs 200 units (155 injected IM into the head and neck muscles every 3 months) for chronic migraine (G43.709). We can do (1) 200U vial with 4 refills for a year.

## 2020-06-11 NOTE — Addendum Note (Signed)
Addended by: Oliver Hum S on: 06/11/2020 11:57 AM   Modules accepted: Orders

## 2020-06-11 NOTE — Telephone Encounter (Signed)
Done

## 2020-06-12 NOTE — Telephone Encounter (Signed)
Faxed signed PA request to [facility]. Waiting for response.

## 2020-06-18 NOTE — Telephone Encounter (Signed)
Received [employer] approval via fax. [employer] PA #[id number] (06/12/20- 06/11/21) [facility] ([phone]).

## 2020-07-25 NOTE — Telephone Encounter (Signed)
I called [facility] to check the status. I spoke with Zachery Conch and provided her with the PA information. She states she will add it in the system and start the order process.

## 2020-07-26 NOTE — Telephone Encounter (Signed)
[facility] called Sharyn Lull and stated there is No pa under pharmacy . Sharyn Lull needs more information [phone] . She can't process at this time please call back .

## 2020-07-27 ENCOUNTER — Other Ambulatory Visit: Payer: Self-pay

## 2020-07-27 ENCOUNTER — Ambulatory Visit (INDEPENDENT_AMBULATORY_CARE_PROVIDER_SITE_OTHER): Payer: BC Managed Care – PPO | Admitting: Psychiatry

## 2020-07-27 ENCOUNTER — Encounter (HOSPITAL_COMMUNITY): Payer: Self-pay | Admitting: Psychiatry

## 2020-07-27 DIAGNOSIS — F0630 Mood disorder due to known physiological condition, unspecified: Secondary | ICD-10-CM

## 2020-07-27 DIAGNOSIS — F900 Attention-deficit hyperactivity disorder, predominantly inattentive type: Secondary | ICD-10-CM | POA: Diagnosis not present

## 2020-07-27 DIAGNOSIS — G43709 Chronic migraine without aura, not intractable, without status migrainosus: Secondary | ICD-10-CM

## 2020-07-27 MED ORDER — DULOXETINE HCL 30 MG PO CPEP: 30.0000 mg | ORAL_CAPSULE | Freq: Two times a day (BID) | ORAL | 1 refills | Status: DC

## 2020-07-27 MED ORDER — AMPHETAMINE-DEXTROAMPHET ER 15 MG PO CP24: 15.0000 mg | ORAL_CAPSULE | Freq: Every morning | ORAL | 0 refills | Status: DC

## 2020-07-27 MED ORDER — AMPHETAMINE-DEXTROAMPHETAMINE 10 MG PO TABS: 10.0000 mg | ORAL_TABLET | Freq: Every evening | ORAL | 0 refills | Status: DC

## 2020-07-27 NOTE — Progress Notes (Signed)
[place] MD OP Progress Note  Virtual Visit via Video Note  I connected with Debbie [name] on 07/27/20 at  8:40 AM EDT by a video enabled telemedicine application and verified that I am speaking with the correct person using two identifiers.  Location: Patient: Home Provider: Clinic   I discussed the limitations of evaluation and management by telemedicine and the availability of in person appointments. The patient expressed understanding and agreed to proceed.  I provided 24 minutes of non-face-to-face time during this encounter.    07/27/2020 9:52 AM [name]  MRN:  [id number]  Chief Complaint: " I am still having headaches with Botox and it does not last for the full 12 weeks and ended up getting it a few weeks sooner."  HPI: Patient stated that she is frustrated with all her other providers may be neurology and her primary care dismissing her physical complaints and blaming everything on depression. She stated that she does not have any depression and that she is stressed mainly because of all the health issues she has been having. She stated that the Botox injections are helpful but did not last the full 12 weeks and she gets her dose before 12 weeks end. She stated that because of his pain and discomfort in her head as well as in her other parts of the body she has become more and more forgetful lately. She stated that she cannot retain information. She stated that Adderall used to work effectively however recently the pharmacist gave her Adderall tablets belonging to a different [employer]. She stated that ever since the switch she feels the Adderall tablets have stopped working completely. She feels very distracted and has a hard time staying on task at work. She stated that she has been with her current employer for past 6 years and the last 2 years have been very difficult.  She stated that thankfully her employers are understanding and everyone is willing to help her.  She  also stated that sometimes she feels like her body hormones completely changed after she had her son about 8 years ago.  She stated that she has verbalized her concerns to her OB/GYN but they also dismissed her and she feels like none of her providers have this into her concerns in timely fashion.  She stated that her current PCP keeps encouraging her to lose weight however she has told them several times that she has hard time getting around due to chronic pain.  They have not considered her diagnosis to be fibromyalgia.  She reported that she has taken gabapentin in the past for chronic pain however that stopped being effective after taking a few doses.  She stated Lyrica in the past and that caused her to have swelling in her joints.  She has taken Effexor in the past which caused her to have adverse effects like hives.  She has never taken Cymbalta. She informed that she did find 2 sessions of acupuncture to be helpful in the recent past.  She could not go because of scheduling issues however she has an appointment made for next week to return back for another session.  She stated that she has to be $80-$90 out-of-pocket but she thinks it is worth it given the benefits.   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 Chronic migraine without aura without status migrainosus, not intractable  G43.709   3. ADHD (attention deficit hyperactivity  disorder), inattentive type  F90.0     Past Psychiatric History: Depression, TBI, ADHD  Past Medical History:  Past Medical History:  Diagnosis Date  . Anxiety    self reported  . Depression    self reported  . Migraine   . No pertinent past medical history     Past Surgical History:  Procedure Laterality Date  . CESAREAN SECTION  2013  . laparscopy    . SHOULDER CAPSULORRHAPHY      Family Psychiatric History: Sister-migraines  Family History:  Family History  Problem Relation Age of  Onset  . Heart disease Maternal Grandfather   . Cerebral aneurysm Sister   . Migraines Sister     Social History:  Social History   Socioeconomic History  . Marital status: Divorced    Spouse name: Not on file  . Number of children: 1  . Years of education: Not on file  . Highest education level: Bachelor's degree (e.g., BA, AB, BS)  Occupational History  . Not on file  Tobacco Use  . Smoking status: Never Smoker  . Smokeless tobacco: Never Used  Vaping Use  . Vaping Use: Never used  Substance and Sexual Activity  . Alcohol use: No  . Drug use: No  . Sexual activity: Yes    Birth control/protection: I.U.D.  Other Topics Concern  . Not on file  Social History Narrative   Lives at home with her son & his father   Right handed   Drinks 1 cup of caffeine daily   Social Determinants of Health   Financial Resource Strain:   . Difficulty of Paying Living Expenses: Not on file  Food Insecurity:   . Worried About Charity fundraiser in the Last Year: Not on file  . Ran Out of Food in the Last Year: Not on file  Transportation Needs:   . Lack of Transportation (Medical): Not on file  . Lack of Transportation (Non-Medical): Not on file  Physical Activity:   . Days of Exercise per Week: Not on file  . Minutes of Exercise per Session: Not on file  Stress:   . Feeling of Stress : Not on file  Social Connections:   . Frequency of Communication with Friends and Family: Not on file  . Frequency of Social Gatherings with Friends and Family: Not on file  . Attends Religious Services: Not on file  . Active Member of Clubs or Organizations: Not on file  . Attends Archivist Meetings: Not on file  . Marital Status: Not on file    Allergies:  Allergies  Allergen Reactions  . Tylenol [Acetaminophen] Anaphylaxis  . Aspirin Other (See Comments)    Unknown childhood rxn  . Benadryl [Diphenhydramine Hcl] Itching  . Dilaudid [Hydromorphone Hcl] Itching    Pill only  .  Penicillins     hives  . Prednisone     Swelling, hives    Metabolic Disorder Labs: No results found for: HGBA1C, MPG No results found for: PROLACTIN No results found for: CHOL, TRIG, HDL, CHOLHDL, VLDL, LDLCALC No results found for: TSH  Therapeutic Level Labs: No results found for: LITHIUM No results found for: VALPROATE No components found for:  CBMZ  Current Medications: Current Outpatient Medications  Medication Sig Dispense Refill  . amphetamine-dextroamphetamine (ADDERALL) 15 MG tablet Take 1 tablet by mouth 2 (two) times daily. 60 tablet 0  . Botulinum Toxin Type A (BOTOX) 200 units SOLR Inject 155 Units as directed every 3 (  three) months. Inject 155units to head and neck intramuscularly every 3 months. 1 each 3  . cyclobenzaprine (FLEXERIL) 5 MG tablet TAKE 1 TABLET BY MOUTH THREE TIMES A DAY AS NEEDED FOR MUSCLE SPASMS 30 tablet 6  . ibuprofen (ADVIL,MOTRIN) 600 MG tablet Take 1 tablet (600 mg total) by mouth every 6 (six) hours as needed for moderate pain. 30 tablet 0  . nortriptyline (PAMELOR) 75 MG capsule Take 1 capsule (75 mg total) by mouth at bedtime. 90 capsule 0  . prochlorperazine (COMPAZINE) 10 MG tablet Take 1 tablet (10 mg total) by mouth every 6 (six) hours as needed for nausea or vomiting. 30 tablet 6  . Rimegepant Sulfate (NURTEC) 75 MG TBDP Take 75 mg by mouth daily as needed (take for abortive therapy of migraine, no more than 1 tablet in 24 hours or 10 per month). 8 tablet 11  . SUMAtriptan (IMITREX) 6 MG/0.5ML SOLN injection Inject 0.5 mLs (6 mg total) into the skin every 2 (two) hours as needed for migraine or headache. No more then 2 injections in 24hrs 10 vial 1   No current facility-administered medications for this visit.      Psychiatric Specialty Exam: Review of Systems  There were no vitals taken for this visit.There is no height or weight on file to calculate BMI.  General Appearance: Fairly Groomed  Eye Contact:  Good  Speech:  Clear and  Coherent and Normal Rate  Volume:  Normal  Mood:  Euthymic, became tearful while talking about her health conditions  Affect:  Congruent  Thought Process:  Goal Directed, Linear and Descriptions of Associations: Intact  Orientation:  Full (Time, Place, and Person)  Thought Content: Logical   Suicidal Thoughts:  No  Homicidal Thoughts:  No  Memory:  Recent;   Good Remote;   Fair  Judgement:  Fair  Insight:  Fair  Psychomotor Activity:  Normal  Concentration:  Concentration: Good and Attention Span: Good  Recall:  Good  Fund of Knowledge: Good  Language: Good  Akathisia:  Negative  Handed:  Right  AIMS (if indicated): not done  Assets:  Communication Skills Desire for Improvement Financial Resources/Insurance Housing  ADL's:  Intact  Cognition: WNL     Assessment and Plan: 39 year old female with history of chronic migraines, mood d/o, ADHD, TBI, tension headaches now seen for follow-up.  Patient reported feeling distressed due to her chronic headaches and migraines.  She stated that she is tired of telling all the providers that she is really struggling physically and it seems like all the providers feel that she is depressed.  She stated that she is in sad state of mind because of the physical complaints. She is willing to try Cymbalta to help with the headaches and also the neuropathic type of pain she is describing.  She has taken Effexor in the past and had side effects to it however we will try Cymbalta and see if that helps.  We will switch to Adderall extended release form and see if that will help her difficulty in focus better.  Also prescribing immediate release Adderall 10 mg dose for use in the evening if needed.  Potential side effects of medication and risks vs benefits of treatment vs non-treatment were explained and discussed. All questions were answered.   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  Dispense: 60 capsule; Refill:  1 -Instructed to start Cymbalta 30 mg capsule once a day for 2 weeks and then increase to twice a day after that.  2. Chronic migraine without aura without status migrainosus, not intractable  - DULoxetine (CYMBALTA) 30 MG capsule; Take 1 capsule (30 mg total) by mouth 2 (two) times daily.  Dispense: 60 capsule; Refill: 1  3. ADHD (attention deficit hyperactivity disorder), inattentive type  - Discontinue Adderall 15 mg BID and switch to extended release Adderall XR in the morning. - Start amphetamine-dextroamphetamine (ADDERALL XR) 15 MG 24 hr capsule; Take 1 capsule by mouth in the morning.  Dispense: 30 capsule; Refill: 0 -Start amphetamine-dextroamphetamine (ADDERALL) 10 MG tablet; Take 1 tablet (10 mg total) by mouth every evening.  Dispense: 30 tablet; Refill: 0 - Start amphetamine-dextroamphetamine (ADDERALL XR) 15 MG 24 hr capsule; Take 1 capsule by mouth in the morning.  Dispense: 30 capsule; Refill: 0 -Start  amphetamine-dextroamphetamine (ADDERALL) 10 MG tablet; Take 1 tablet (10 mg total) by mouth every evening.  Dispense: 30 tablet; Refill: 0   Follow-up in 2 months.  Nevada Crane, MD 07/27/2020, 9:52 AM

## 2020-07-31 NOTE — Telephone Encounter (Signed)
I called [facility] and spoke with Joy. She states insurance team is having trouble verifying benefits. The case is still open. I asked Joy if she could expedite the process because patient has an appointment 9/9.

## 2020-08-01 ENCOUNTER — Telehealth (HOSPITAL_COMMUNITY): Payer: Self-pay | Admitting: *Deleted

## 2020-08-01 NOTE — Telephone Encounter (Signed)
Received request for patients Amphetamine- dextroamphet ER 15 mg needing prior authorization. Authorization was given, called the pharmacy to inform of this and to go ahead with filling it. Pharmacist stated patient did not want to wait on [place] and paid for it out of pocket but the authorization would be used for the next time.

## 2020-08-01 NOTE — Telephone Encounter (Signed)
I called [facility] and spoke with Lelan Pons to schedule delivery. Botox will be here 9/9.

## 2020-08-02 ENCOUNTER — Ambulatory Visit: Payer: BC Managed Care – PPO | Admitting: Family Medicine

## 2020-08-02 ENCOUNTER — Other Ambulatory Visit: Payer: Self-pay

## 2020-08-02 DIAGNOSIS — G43709 Chronic migraine without aura, not intractable, without status migrainosus: Secondary | ICD-10-CM

## 2020-08-02 NOTE — Progress Notes (Signed)
Botox- 200 units x 1 vial Lot: [id number] Expiration: 03/2023 NDC: 8375-4237-02  Bacteriostatic 0.9% Sodium Chloride- 56mL total Lot: [id number] Expiration: 12/2021 NDC: 1068-1661-96  Dx: L40.982 S/P  Consent was given and signed today.

## 2020-08-02 NOTE — Progress Notes (Signed)
Debbie [name] is doing well. She reports having 1-2 migraines over the past 3 months. Nurtec works well for abortive therapy. She continues to see PCP and psychiatry regularly. She is feeling well today and without complaints.   Consent Form Botulism Toxin Injection For Chronic Migraine    Reviewed orally with patient, additionally signature is on file:  Botulism toxin has been approved by the Federal drug administration for treatment of chronic migraine. Botulism toxin does not cure chronic migraine and it may not be effective in some patients.  The administration of botulism toxin is accomplished by injecting a small amount of toxin into the muscles of the neck and head. Dosage must be titrated for each individual. Any benefits resulting from botulism toxin tend to wear off after 3 months with a repeat injection required if benefit is to be maintained. Injections are usually done every 3-4 months with maximum effect peak achieved by about 2 or 3 weeks. Botulism toxin is expensive and you should be sure of what costs you will incur resulting from the injection.  The side effects of botulism toxin use for chronic migraine may include:   -Transient, and usually mild, facial weakness with facial injections  -Transient, and usually mild, head or neck weakness with head/neck injections  -Reduction or loss of forehead facial animation due to forehead muscle weakness  -Eyelid drooping  -Dry eye  -Pain at the site of injection or bruising at the site of injection  -Double vision  -Potential unknown long term risks   Contraindications: You should not have Botox if you are pregnant, nursing, allergic to albumin, have an infection, skin condition, or muscle weakness at the site of the injection, or have myasthenia gravis, Lambert-Eaton syndrome, or ALS.  It is also possible that as with any injection, there may be an allergic reaction or no effect from the medication. Reduced effectiveness after repeated  injections is sometimes seen and rarely infection at the injection site may occur. All care will be taken to prevent these side effects. If therapy is given over a long time, atrophy and wasting in the muscle injected may occur. Occasionally the patient's become refractory to treatment because they develop antibodies to the toxin. In this event, therapy needs to be modified.  I have read the above information and consent to the administration of botulism toxin.    BOTOX PROCEDURE NOTE FOR MIGRAINE HEADACHE  Contraindications and precautions discussed with patient(above). Aseptic procedure was observed and patient tolerated procedure. Procedure performed by [name], FNP-C.   The condition has existed for more than 6 months, and pt does not have a diagnosis of ALS, Myasthenia Gravis or Lambert-Eaton Syndrome.  Risks and benefits of injections discussed and pt agrees to proceed with the procedure.  Written consent obtained  These injections are medically necessary. Pt  receives good benefits from these injections. These injections do not cause sedations or hallucinations which the oral therapies may cause.   Description of procedure:  The patient was placed in a sitting position. The standard protocol was used for Botox as follows, with 5 units of Botox injected at each site:  -Procerus muscle, midline injection  -Corrugator muscle, bilateral injection  -Frontalis muscle, bilateral injection, with 2 sites each side, medial injection was performed in the upper one third of the frontalis muscle, in the region vertical from the medial inferior edge of the superior orbital rim. The lateral injection was again in the upper one third of the forehead vertically above the lateral  limbus of the cornea, 1.5 cm lateral to the medial injection site.  -Temporalis muscle injection, 4 sites, bilaterally. The first injection was 3 cm above the tragus of the ear, second injection site was 1.5 cm to 3 cm up  from the first injection site in line with the tragus of the ear. The third injection site was 1.5-3 cm forward between the first 2 injection sites. The fourth injection site was 1.5 cm posterior to the second injection site. 5th site laterally in the temporalis  muscleat the level of the outer canthus.  -Occipitalis muscle injection, 3 sites, bilaterally. The first injection was done one half way between the occipital protuberance and the tip of the mastoid process behind the ear. The second injection site was done lateral and superior to the first, 1 fingerbreadth from the first injection. The third injection site was 1 fingerbreadth superiorly and medially from the first injection site.  -Cervical paraspinal muscle injection, 2 sites, bilaterally. The first injection site was 1 cm from the midline of the cervical spine, 3 cm inferior to the lower border of the occipital protuberance. The second injection site was 1.5 cm superiorly and laterally to the first injection site.  -Trapezius muscle injection was performed at 3 sites, bilaterally. The first injection site was in the upper trapezius muscle halfway between the inflection point of the neck, and the acromion. The second injection site was one half way between the acromion and the first injection site. The third injection was done between the first injection site and the inflection point of the neck.   Will return for repeat injection in 3 months.   A total of 200 units of Botox was prepared, 155 units of Botox was injected as documented above, any Botox not injected was wasted. The patient tolerated the procedure well, there were no complications of the above procedure.

## 2020-08-02 NOTE — Telephone Encounter (Signed)
Botox delivered today from [place]. (1) 200U vial.

## 2020-08-07 HISTORY — PX: OTHER SURGICAL HISTORY: SHX169

## 2020-08-08 ENCOUNTER — Telehealth (HOSPITAL_COMMUNITY): Payer: Self-pay | Admitting: *Deleted

## 2020-08-08 NOTE — Telephone Encounter (Signed)
SPOKE WITH PATIENT PER PROVIDER: Instruct her to discontinue Cymbalta and see how she does if her symptoms improve in a week after discontinuing the medicine then they were due to the medicine.  However if the symptoms still continue then she needs to contact her primary care provider. Advise her to contact our clinic next week to let us know how she is doing.

## 2020-08-08 NOTE — Telephone Encounter (Addendum)
Instruct her to discontinue Cymbalta and see how she does if her symptoms improve in a week after discontinuing the medicine then they were due to the medicine.  However if the symptoms still continue then she needs to contact her primary care provider. Advise her to contact our clinic next week to let us know how she is doing.

## 2020-08-08 NOTE — Telephone Encounter (Signed)
Patient called with complaint of  DULoxetine (CYMBALTA) 30 MG capsule. Patients states that since starting the medication she had mild symptoms of no fever, chills, sweating profusely that she felt were manageable. And that since starting week 2 of increasing medication as directed taking 2 daily  1 in the AM  & 1 in the PM. The symptoms have become increasingly worse. And was instructed to call nurse lines with any med issues/concerns.

## 2020-08-13 ENCOUNTER — Telehealth (HOSPITAL_COMMUNITY): Payer: Self-pay | Admitting: *Deleted

## 2020-08-13 NOTE — Telephone Encounter (Signed)
Call as a follow up re complaint of side affects related to Cymbalta she reported last week. She was instructed to stop taking it and if side affects cleared up then could fault the Cymbalta if not she was to follow up with her PCP.She reports now after stopping the Cymbalta she is doing much better and has no side affects. THis info will be shared with Dr Toy Care.

## 2020-08-13 NOTE — Telephone Encounter (Signed)
Thanks for the follow up. It seems like we have exhausted the list of meds. We will continue things the way there are for now and discuss further options at the time of next appointment.

## 2020-08-19 ENCOUNTER — Other Ambulatory Visit (HOSPITAL_COMMUNITY): Payer: Self-pay | Admitting: Psychiatry

## 2020-08-19 DIAGNOSIS — G43709 Chronic migraine without aura, not intractable, without status migrainosus: Secondary | ICD-10-CM

## 2020-08-19 DIAGNOSIS — F0630 Mood disorder due to known physiological condition, unspecified: Secondary | ICD-10-CM

## 2020-08-24 ENCOUNTER — Other Ambulatory Visit: Payer: Self-pay | Admitting: Neurology

## 2020-08-24 DIAGNOSIS — G4700 Insomnia, unspecified: Secondary | ICD-10-CM

## 2020-08-24 DIAGNOSIS — G43709 Chronic migraine without aura, not intractable, without status migrainosus: Secondary | ICD-10-CM

## 2020-08-27 ENCOUNTER — Telehealth (HOSPITAL_COMMUNITY): Payer: Self-pay | Admitting: *Deleted

## 2020-08-27 NOTE — Telephone Encounter (Signed)
That is totally doable. Please inform the pt if she would like to retry Cymbalta then she can do so. She may discontinue Adderall if she wants to see how she does with out it.

## 2020-08-27 NOTE — Telephone Encounter (Signed)
Call from patient stating she stopped the Cymbalta because she felt sick on it. She is continuing to take the Adderall and would like to discuss with the Dr. If she can stop the Adderall and restart the Cymbalta because the Cymbalta had some benefits she liked. Will pass this information on to the provider.

## 2020-08-27 NOTE — Telephone Encounter (Signed)
Spoke with [place]oir re her question to retry the Cymbalta and stop the Adderall. Dr Toy Care consulted and she agreed to this trial. Bethany will call with update as needed.

## 2020-08-29 ENCOUNTER — Encounter: Payer: Self-pay | Admitting: Family Medicine

## 2020-08-30 ENCOUNTER — Telehealth: Payer: Self-pay | Admitting: *Deleted

## 2020-08-30 NOTE — Telephone Encounter (Signed)
LMVM for pt that AL/NP offered toradol 30mg  IM injection if pt wants.  I donot see allergic and not pregnant? Pt to call back .  Offered until 1630.

## 2020-09-24 ENCOUNTER — Encounter (HOSPITAL_COMMUNITY): Payer: Self-pay | Admitting: Psychiatry

## 2020-09-24 ENCOUNTER — Other Ambulatory Visit: Payer: Self-pay

## 2020-09-24 ENCOUNTER — Ambulatory Visit (INDEPENDENT_AMBULATORY_CARE_PROVIDER_SITE_OTHER): Payer: BC Managed Care – PPO | Admitting: Psychiatry

## 2020-09-24 ENCOUNTER — Encounter: Payer: Self-pay | Admitting: Neurology

## 2020-09-24 VITALS — BP 118/83 | HR 95 | Ht 63.0 in | Wt 219.0 lb

## 2020-09-24 DIAGNOSIS — F900 Attention-deficit hyperactivity disorder, predominantly inattentive type: Secondary | ICD-10-CM

## 2020-09-24 DIAGNOSIS — F0630 Mood disorder due to known physiological condition, unspecified: Secondary | ICD-10-CM | POA: Diagnosis not present

## 2020-09-24 DIAGNOSIS — G43709 Chronic migraine without aura, not intractable, without status migrainosus: Secondary | ICD-10-CM | POA: Diagnosis not present

## 2020-09-24 MED ORDER — DULOXETINE HCL 30 MG PO CPEP: 30.0000 mg | ORAL_CAPSULE | Freq: Every day | ORAL | 2 refills | Status: DC

## 2020-09-24 NOTE — Progress Notes (Signed)
[facility] OP Progress Note  09/24/2020 9:18 AM Debbie [name]  MRN:  [id number]  Chief Complaint:  Chief Complaint    Follow-up; Medication Management    " I am no longer receiving Botox injections and I still keep having those headaches."  HPI: This visit, patient was started on Cymbalta with the aim to target her mood, irritability and also chronic pain issues.  A few days after she tried Cymbalta that she had called the clinic to report that Cymbalta was making her excessively lethargic and that she could not tolerate it and therefore she had decided to discontinue it.  Then she contacted the clinic again few days later to report that maybe the Cymbalta did help her to some extent and that she wanted to try to get but did not find Adderall XR to be helpful at all so therefore she was discontinuing it. Today, patient reported that she is not taking either the Adderall XR over the IR.  She also informed that she is not taking the Cymbalta anymore. She informed that she did really well with Cymbalta after taking it consistently for 5 days however fifth day on which she started to feel very lethargic, swollen, could not get out of her bed.  It did help her with mood and she did not notice any drastic mood swings when she took it.  She also noticed that her anger issues were better and she did not react immediately after getting angry.  In fact her 38-year-old son also gave her the complement that she seemed to be different and better.  However due to the overwhelming physical side effects she had to discontinue the medicine after taking it for 5 days. She informed that she has tried 3 cycles of Botox and they have not been helpful.  Despite taking the Botox she continues to have headaches.  She also reported having drooping of upper eyelids secondary to Botox which was very concerning for her. She then expressed extreme frustration on how her migraine headaches have not improved despite trying several different  options.  She stated that she has noticed her pain is mainly localized to the frontal lobes and that it has now shifted from the left side to being predominant on the right side.  She has discussed this with her neurology provider however the room seem to be in agreement regarding her personal findings.  She stated that she feels like she has to defend herself every time she goes there to see the provider.  Patient stated that she is thinking of getting a second opinion.  Writer recommended that that seem like a good option and that she should ask her PCP to refer her to a different neurology clinic as it seems like the current neurology clinic does not seem to be supportive of the patient.  Writer recommended that she should continue taking the Cymbalta and maybe take a day or 2 off in between because the dose that she is currently prescribed is quite low.  So maybe she can continue to take it at cycles of taking it for 4 days and then taking a day off.  Patient was agreeable to try that.   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 Chronic migraine without aura without status migrainosus, not intractable  G43.709   3. ADHD (attention deficit hyperactivity disorder), inattentive type  F90.0     Past Psychiatric History: Depression,  TBI, ADHD  Past Medical History:  Past Medical History:  Diagnosis Date  . Anxiety    self reported  . Depression    self reported  . Migraine   . No pertinent past medical history     Past Surgical History:  Procedure Laterality Date  . CESAREAN SECTION  2013  . laparscopy    . SHOULDER CAPSULORRHAPHY      Family Psychiatric History: Sister-migraines  Family History:  Family History  Problem Relation Age of Onset  . Heart disease Maternal Grandfather   . Cerebral aneurysm Sister   . Migraines Sister     Social History:  Social History   Socioeconomic History  . Marital status:  Divorced    Spouse name: Not on file  . Number of children: 1  . Years of education: Not on file  . Highest education level: Bachelor's degree (e.g., BA, AB, BS)  Occupational History  . Not on file  Tobacco Use  . Smoking status: Never Smoker  . Smokeless tobacco: Never Used  Vaping Use  . Vaping Use: Never used  Substance and Sexual Activity  . Alcohol use: No  . Drug use: No  . Sexual activity: Yes    Birth control/protection: I.U.D.  Other Topics Concern  . Not on file  Social History Narrative   Lives at home with her son & his father   Right handed   Drinks 1 cup of caffeine daily   Social Determinants of Health   Financial Resource Strain:   . Difficulty of Paying Living Expenses: Not on file  Food Insecurity:   . Worried About Charity fundraiser in the Last Year: Not on file  . Ran Out of Food in the Last Year: Not on file  Transportation Needs:   . Lack of Transportation (Medical): Not on file  . Lack of Transportation (Non-Medical): Not on file  Physical Activity:   . Days of Exercise per Week: Not on file  . Minutes of Exercise per Session: Not on file  Stress:   . Feeling of Stress : Not on file  Social Connections:   . Frequency of Communication with Friends and Family: Not on file  . Frequency of Social Gatherings with Friends and Family: Not on file  . Attends Religious Services: Not on file  . Active Member of Clubs or Organizations: Not on file  . Attends Archivist Meetings: Not on file  . Marital Status: Not on file    Allergies:  Allergies  Allergen Reactions  . Tylenol [Acetaminophen] Anaphylaxis  . Aspirin Other (See Comments)    Unknown childhood rxn  . Benadryl [Diphenhydramine Hcl] Itching  . Dilaudid [Hydromorphone Hcl] Itching    Pill only  . Penicillins     hives  . Prednisone     Swelling, hives    Metabolic Disorder Labs: No results found for: HGBA1C, MPG No results found for: PROLACTIN No results found for:  CHOL, TRIG, HDL, CHOLHDL, VLDL, LDLCALC No results found for: TSH  Therapeutic Level Labs: No results found for: LITHIUM No results found for: VALPROATE No components found for:  CBMZ  Current Medications: Current Outpatient Medications  Medication Sig Dispense Refill  . cyclobenzaprine (FLEXERIL) 5 MG tablet TAKE 1 TABLET BY MOUTH THREE TIMES A DAY AS NEEDED FOR MUSCLE SPASMS 30 tablet 6  . ibuprofen (ADVIL,MOTRIN) 600 MG tablet Take 1 tablet (600 mg total) by mouth every 6 (six) hours as needed for moderate pain. Simpson  tablet 0  . prochlorperazine (COMPAZINE) 10 MG tablet Take 1 tablet (10 mg total) by mouth every 6 (six) hours as needed for nausea or vomiting. 30 tablet 6  . Rimegepant Sulfate (NURTEC) 75 MG TBDP Take 75 mg by mouth daily as needed (take for abortive therapy of migraine, no more than 1 tablet in 24 hours or 10 per month). 8 tablet 11  . SUMAtriptan (IMITREX) 6 MG/0.5ML SOLN injection Inject 0.5 mLs (6 mg total) into the skin every 2 (two) hours as needed for migraine or headache. No more then 2 injections in 24hrs 10 vial 1  . amphetamine-dextroamphetamine (ADDERALL XR) 15 MG 24 hr capsule Take 1 capsule by mouth in the morning. (Patient not taking: Reported on 09/24/2020) 30 capsule 0  . amphetamine-dextroamphetamine (ADDERALL XR) 15 MG 24 hr capsule Take 1 capsule by mouth in the morning. (Patient not taking: Reported on 09/24/2020) 30 capsule 0  . amphetamine-dextroamphetamine (ADDERALL) 10 MG tablet Take 1 tablet (10 mg total) by mouth every evening. (Patient not taking: Reported on 09/24/2020) 30 tablet 0  . amphetamine-dextroamphetamine (ADDERALL) 10 MG tablet Take 1 tablet (10 mg total) by mouth every evening. (Patient not taking: Reported on 09/24/2020) 30 tablet 0  . Botulinum Toxin Type A (BOTOX) 200 units SOLR Inject 155 Units as directed every 3 (three) months. Inject 155units to head and neck intramuscularly every 3 months. (Patient not taking: Reported on 09/24/2020) 1  each 3  . DULoxetine (CYMBALTA) 30 MG capsule Take 1 capsule (30 mg total) by mouth 2 (two) times daily. (Patient not taking: Reported on 09/24/2020) 60 capsule 1   No current facility-administered medications for this visit.      Psychiatric Specialty Exam: Review of Systems  There were no vitals taken for this visit.There is no height or weight on file to calculate BMI.  General Appearance: Fairly Groomed  Eye Contact:  Good  Speech:  Clear and Coherent and Normal Rate  Volume:  Normal  Mood:  Euthymic, became tearful while talking about her health conditions  Affect:  Congruent  Thought Process:  Goal Directed, Linear and Descriptions of Associations: Intact  Orientation:  Full (Time, Place, and Person)  Thought Content: Logical   Suicidal Thoughts:  No  Homicidal Thoughts:  No  Memory:  Recent;   Good Remote;   Fair  Judgement:  Fair  Insight:  Fair  Psychomotor Activity:  Normal  Concentration:  Concentration: Good and Attention Span: Good  Recall:  Good  Fund of Knowledge: Good  Language: Good  Akathisia:  Negative  Handed:  Right  AIMS (if indicated): not done  Assets:  Communication Skills Desire for Improvement Financial Resources/Insurance Housing  ADL's:  Intact  Cognition: WNL     Assessment and Plan: Patient continues to deal with frequent daily headaches and also has other chronic back pain issues.  She is willing to retry Cymbalta however plans to take intermittent breaks in between to avoid the side effects.  She is also going to ask her PCP for referral to a different neurologist for optimal control of her headaches.  1. 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  2. Chronic migraine without aura without status migrainosus, not intractable  3. ADHD (attention deficit hyperactivity disorder), inattentive type -Not taking Adderall XR or IR anymore  as did not find it to be too effective.  Follow-up in 3 months.  Taccara Bushnell  [facility], MD 09/24/2020, 9:18 AM

## 2020-10-25 ENCOUNTER — Other Ambulatory Visit (HOSPITAL_COMMUNITY): Payer: Self-pay | Admitting: Psychiatry

## 2020-10-25 DIAGNOSIS — G43709 Chronic migraine without aura, not intractable, without status migrainosus: Secondary | ICD-10-CM

## 2020-10-25 DIAGNOSIS — F0630 Mood disorder due to known physiological condition, unspecified: Secondary | ICD-10-CM

## 2020-11-01 ENCOUNTER — Telehealth: Payer: Self-pay | Admitting: Family Medicine

## 2020-11-01 NOTE — Telephone Encounter (Signed)
Patient has a Botox appointment on 12/15. I called [facility] and spoke to [place] to schedule delivery. Botox TBD 12/14.

## 2020-11-06 NOTE — Telephone Encounter (Signed)
(  1) 200U vial of Botox arrived today from [place]. Patient cancelled her appointment tomorrow via the automated reminder system. I called the patient and rescheduled her appointment to 12/16 at 9am.

## 2020-11-07 ENCOUNTER — Ambulatory Visit: Payer: BC Managed Care – PPO | Admitting: Family Medicine

## 2020-11-07 NOTE — Progress Notes (Addendum)
She is doing well. She has about 4 migraines per month. Migraines worsen the week prior to Botox being due.   Consent Form Botulism Toxin Injection For Chronic Migraine   Reviewed orally with patient, additionally signature is on file:  Botulism toxin has been approved by the Federal drug administration for treatment of chronic migraine. Botulism toxin does not cure chronic migraine and it may not be effective in some patients.  The administration of botulism toxin is accomplished by injecting a small amount of toxin into the muscles of the neck and head. Dosage must be titrated for each individual. Any benefits resulting from botulism toxin tend to wear off after 3 months with a repeat injection required if benefit is to be maintained. Injections are usually done every 3-4 months with maximum effect peak achieved by about 2 or 3 weeks. Botulism toxin is expensive and you should be sure of what costs you will incur resulting from the injection.  The side effects of botulism toxin use for chronic migraine may include:   -Transient, and usually mild, facial weakness with facial injections  -Transient, and usually mild, head or neck weakness with head/neck injections  -Reduction or loss of forehead facial animation due to forehead muscle weakness  -Eyelid drooping  -Dry eye  -Pain at the site of injection or bruising at the site of injection  -Double vision  -Potential unknown long term risks   Contraindications: You should not have Botox if you are pregnant, nursing, allergic to albumin, have an infection, skin condition, or muscle weakness at the site of the injection, or have myasthenia gravis, Lambert-Eaton syndrome, or ALS.  It is also possible that as with any injection, there may be an allergic reaction or no effect from the medication. Reduced effectiveness after repeated injections is sometimes seen and rarely infection at the injection site may occur. All care will be taken  to prevent these side effects. If therapy is given over a long time, atrophy and wasting in the muscle injected may occur. Occasionally the patient's become refractory to treatment because they develop antibodies to the toxin. In this event, therapy needs to be modified.  I have read the above information and consent to the administration of botulism toxin.    BOTOX PROCEDURE NOTE FOR MIGRAINE HEADACHE  Contraindications and precautions discussed with patient(above). Aseptic procedure was observed and patient tolerated procedure. Procedure performed by [name], FNP-C.   The condition has existed for more than 6 months, and pt does not have a diagnosis of ALS, Myasthenia Gravis or Lambert-Eaton Syndrome.  Risks and benefits of injections discussed and pt agrees to proceed with the procedure.  Written consent obtained  These injections are medically necessary. Pt  receives good benefits from these injections. These injections do not cause sedations or hallucinations which the oral therapies may cause.   Description of procedure:  The patient was placed in a sitting position. The standard protocol was used for Botox as follows, with 5 units of Botox injected at each site:  -Procerus muscle, midline injection  -Corrugator muscle, bilateral injection  -Frontalis muscle, bilateral injection, with 2 sites each side, medial injection was performed in the upper one third of the frontalis muscle, in the region vertical from the medial inferior edge of the superior orbital rim. The lateral injection was again in the upper one third of the forehead vertically above the lateral limbus of the cornea, 1.5 cm lateral to the medial injection site.  -Temporalis muscle  injection, 4 sites, bilaterally. The first injection was 3 cm above the tragus of the ear, second injection site was 1.5 cm to 3 cm up from the first injection site in line with the tragus of the ear. The third injection site was 1.5-3 cm  forward between the first 2 injection sites. The fourth injection site was 1.5 cm posterior to the second injection site. 5th site laterally in the temporalis  muscleat the level of the outer canthus.  -Occipitalis muscle injection, 3 sites, bilaterally. The first injection was done one half way between the occipital protuberance and the tip of the mastoid process behind the ear. The second injection site was done lateral and superior to the first, 1 fingerbreadth from the first injection. The third injection site was 1 fingerbreadth superiorly and medially from the first injection site.  -Cervical paraspinal muscle injection, 2 sites, bilaterally. The first injection site was 1 cm from the midline of the cervical spine, 3 cm inferior to the lower border of the occipital protuberance. The second injection site was 1.5 cm superiorly and laterally to the first injection site.  -Trapezius muscle injection was performed at 3 sites, bilaterally. The first injection site was in the upper trapezius muscle halfway between the inflection point of the neck, and the acromion. The second injection site was one half way between the acromion and the first injection site. The third injection was done between the first injection site and the inflection point of the neck.   Will return for repeat injection in 3 months.   A total of 200 units of Botox was prepared, 155 units of Botox was injected as documented above, any Botox not injected was wasted. The patient tolerated the procedure well, there were no complications of the above procedure.  Made any corrections needed, and agree with procedure.   Sarina Ill, MD [facility]

## 2020-11-08 ENCOUNTER — Ambulatory Visit: Payer: BC Managed Care – PPO | Admitting: Family Medicine

## 2020-11-08 ENCOUNTER — Encounter: Payer: Self-pay | Admitting: Family Medicine

## 2020-11-08 DIAGNOSIS — G43709 Chronic migraine without aura, not intractable, without status migrainosus: Secondary | ICD-10-CM | POA: Diagnosis not present

## 2020-11-08 NOTE — Progress Notes (Signed)
Botox-100unitsx2 vials Lot: [id number] Expiration: 07/2023 NDC: 8184-0375-43   0.9% Sodium Chloride- 74mL total Lot: [id number] Expiration: 08/2022 NDC: 40352-481-85  Dx: CHRONIC MIGRAINE SP  Consent signed
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
## 2020-12-06 ENCOUNTER — Encounter: Payer: Self-pay | Admitting: Neurology

## 2020-12-06 ENCOUNTER — Ambulatory Visit: Payer: BC Managed Care – PPO | Admitting: Neurology

## 2020-12-06 ENCOUNTER — Other Ambulatory Visit: Payer: Self-pay

## 2020-12-06 VITALS — BP 114/75 | HR 98 | Ht 63.0 in | Wt 219.4 lb

## 2020-12-06 DIAGNOSIS — M7918 Myalgia, other site: Secondary | ICD-10-CM | POA: Diagnosis not present

## 2020-12-06 DIAGNOSIS — G43709 Chronic migraine without aura, not intractable, without status migrainosus: Secondary | ICD-10-CM

## 2020-12-06 NOTE — Patient Instructions (Signed)
  1. For alternative of current regimen, I propose two options:  -  Continue Botox.  Stop Nurtec and instead add Qulipta 60mg  daily  -  Stop Botox and Nurtec and instead start Vyepti (CGRP inhibitor) infusion every 3 months 2. Limit use of pain relievers to no more than 2 days out of the week.  These medications include acetaminophen, NSAIDs (ibuprofen/Advil/Motrin, naproxen/Aleve, triptans (Imitrex/sumatriptan), Excedrin, and narcotics.  This will help reduce risk of rebound headaches. 3.  Consider supplements:  magnesium citrate 400mg  daily, riboflavin 400mg  daily, coenzyme Q10 300mg  daily 4.  I will refer you to Dr. Hulan Saas of Sports Medicine for consideration of osteopathic manipulative medicine. 5  Be aware of common food triggers:  - Caffeine:  coffee, black tea, cola, Mt. Dew  - Chocolate  - Dairy:  aged cheeses (brie, blue, cheddar, gouda, [place], provolone, [place], Swiss, etc), chocolate milk, buttermilk, sour cream, limit eggs and yogurt  - Nuts, peanut butter  - Alcohol  - Cereals/grains:  FRESH breads (fresh bagels, sourdough, doughnuts), yeast productions  - Processed/canned/aged/cured meats (pre-packaged deli meats, hotdogs)  - MSG/glutamate:  soy sauce, flavor enhancer, pickled/preserved/marinated foods  - Sweeteners:  aspartame (Equal, Nutrasweet).  Sugar and Splenda are okay  - Vegetables:  legumes (lima beans, lentils, snow peas, fava beans, pinto peans, peas, garbanzo beans), sauerkraut, onions, olives, pickles  - Fruit:  avocados, bananas, citrus fruit (orange, lemon, grapefruit), mango  - Other:  Frozen meals, macaroni and cheese 3. Routine exercise 4. Stay adequately hydrated (aim for 64 oz water daily) 5. Keep headache diary 6. Maintain proper stress management 7. Maintain proper sleep hygiene 8. Do not skip meals 9.

## 2020-12-17 ENCOUNTER — Encounter (HOSPITAL_COMMUNITY): Payer: Self-pay | Admitting: Psychiatry

## 2020-12-17 ENCOUNTER — Ambulatory Visit (INDEPENDENT_AMBULATORY_CARE_PROVIDER_SITE_OTHER): Payer: BC Managed Care – PPO | Admitting: Psychiatry

## 2020-12-17 ENCOUNTER — Other Ambulatory Visit: Payer: Self-pay

## 2020-12-17 DIAGNOSIS — G43709 Chronic migraine without aura, not intractable, without status migrainosus: Secondary | ICD-10-CM

## 2020-12-17 DIAGNOSIS — F900 Attention-deficit hyperactivity disorder, predominantly inattentive type: Secondary | ICD-10-CM

## 2020-12-17 DIAGNOSIS — F0630 Mood disorder due to known physiological condition, unspecified: Secondary | ICD-10-CM | POA: Diagnosis not present

## 2020-12-17 NOTE — Progress Notes (Signed)
[facility] MD OP Progress Note  12/17/2020 11:30 AM Debbie [name]  MRN:  [id number]  Chief Complaint: " I have been doing slightly better since December."  HPI: Patient reported that a few weeks ago she felt a sudden pop in her neck and ever since then for some reason her back pain issues have resolved miraculously.  She stated that since last 2 months she has been doing yoga very regularly and that has helped her with her muscle tightness.  She stated that she saw a new neurologist Dr. Everlena Cooper with [facility] and he recommended that she sees a provider who is well-versed with osteopathic medicine for maneuvers that can also provide her with relief.  She has found a new chiropractor who is well versed with cervical spine manipulation and she is hoping to work with them soon.  She also informed that her new PCP is a D.O., trained in osteopathic medicine.  She stated that she can take Cymbalta for more than 4 days in a row because after that she will start feeling drowsy and will start swelling increasingly.  She stated that Cymbalta did help her irritability and her anger and made her realize that she could not lose her temper so easily.  She also follow things going well with her son who is now 66 years old and is well managed on his ADHD medication.  She stated that she has started exercising regularly however instead of losing weight she has gained some weight.  She stated that she is created a small gym for her in her home garage which is beneficial.  Writer applauded her for making all these positive changes to take control of her health.  She was advised to an osteopathic trained provider  like her neurologist recommended.  Writer advised the patient that she can try taking Cymbalta every other day and see if that helps to alleviate the drowsiness side effect.   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 Chronic migraine without aura  without status migrainosus, not intractable  G43.709   3. ADHD (attention deficit hyperactivity disorder), inattentive type  F90.0     Past Psychiatric History: Depression, TBI, ADHD  Past Medical History:  Past Medical History:  Diagnosis Date  . Anxiety    self reported  . Depression    self reported  . Migraine   . No pertinent past medical history     Past Surgical History:  Procedure Laterality Date  . CESAREAN SECTION  2013  . laparscopy    . SHOULDER CAPSULORRHAPHY      Family Psychiatric History: Sister-migraines  Family History:  Family History  Problem Relation Age of Onset  . Heart disease Maternal Grandfather   . Cerebral aneurysm Sister   . Migraines Sister     Social History:  Social History   Socioeconomic History  . Marital status: Divorced    Spouse name: Not on file  . Number of children: 1  . Years of education: Not on file  . Highest education level: Bachelor's degree (e.g., BA, AB, BS)  Occupational History  . Not on file  Tobacco Use  . Smoking status: Never Smoker  . Smokeless tobacco: Never Used  Vaping Use  . Vaping Use: Never used  Substance and Sexual Activity  . Alcohol use: No  . Drug use: No  . Sexual activity: Yes    Birth control/protection: I.U.D.  Other Topics  Concern  . Not on file  Social History Narrative   Lives at home with her son & his father   Right handed   Drinks 1 cup of caffeine daily   Social Determinants of Health   Financial Resource Strain: Not on file  Food Insecurity: Not on file  Transportation Needs: Not on file  Physical Activity: Not on file  Stress: Not on file  Social Connections: Not on file    Allergies:  Allergies  Allergen Reactions  . Tylenol [Acetaminophen] Anaphylaxis  . Aspirin Other (See Comments)    Unknown childhood rxn  . Benadryl [Diphenhydramine Hcl] Itching  . Dilaudid [Hydromorphone Hcl] Itching    Pill only  . Penicillins     hives  . Prednisone     Swelling,  hives    Metabolic Disorder Labs: No results found for: HGBA1C, MPG No results found for: PROLACTIN No results found for: CHOL, TRIG, HDL, CHOLHDL, VLDL, LDLCALC No results found for: TSH  Therapeutic Level Labs: No results found for: LITHIUM No results found for: VALPROATE No components found for:  CBMZ  Current Medications: Current Outpatient Medications  Medication Sig Dispense Refill  . Botulinum Toxin Type A (BOTOX) 200 units SOLR Inject 155 Units as directed every 3 (three) months. Inject 155units to head and neck intramuscularly every 3 months. 1 each 3  . cyclobenzaprine (FLEXERIL) 5 MG tablet TAKE 1 TABLET BY MOUTH THREE TIMES A DAY AS NEEDED FOR MUSCLE SPASMS 30 tablet 6  . DULoxetine (CYMBALTA) 30 MG capsule TAKE 1 CAPSULE BY MOUTH EVERY DAY (Patient not taking: Reported on 12/06/2020) 90 capsule 1  . gabapentin (NEURONTIN) 300 MG capsule Take 300 mg by mouth 3 (three) times daily.    Marland Kitchen ibuprofen (ADVIL,MOTRIN) 600 MG tablet Take 1 tablet (600 mg total) by mouth every 6 (six) hours as needed for moderate pain. 30 tablet 0  . prochlorperazine (COMPAZINE) 10 MG tablet Take 1 tablet (10 mg total) by mouth every 6 (six) hours as needed for nausea or vomiting. 30 tablet 6  . Rimegepant Sulfate (NURTEC) 75 MG TBDP Take 75 mg by mouth daily as needed (take for abortive therapy of migraine, no more than 1 tablet in 24 hours or 10 per month). 8 tablet 11  . SUMAtriptan (IMITREX) 6 MG/0.5ML SOLN injection Inject 0.5 mLs (6 mg total) into the skin every 2 (two) hours as needed for migraine or headache. No more then 2 injections in 24hrs 10 vial 1   No current facility-administered medications for this visit.      Psychiatric Specialty Exam: Review of Systems  There were no vitals taken for this visit.There is no height or weight on file to calculate BMI.  General Appearance: Fairly Groomed  Eye Contact:  Good  Speech:  Clear and Coherent and Normal Rate  Volume:  Normal  Mood:   Euthymic  Affect:  Congruent  Thought Process:  Goal Directed, Linear and Descriptions of Associations: Intact  Orientation:  Full (Time, Place, and Person)  Thought Content: Logical   Suicidal Thoughts:  No  Homicidal Thoughts:  No  Memory:  Recent;   Good Remote;   Fair  Judgement:  Fair  Insight:  Fair  Psychomotor Activity:  Normal  Concentration:  Concentration: Good and Attention Span: Good  Recall:  Good  Fund of Knowledge: Good  Language: Good  Akathisia:  Negative  Handed:  Right  AIMS (if indicated): not done  Assets:  Communication Skills Desire for Improvement Financial  Resources/Insurance Housing  ADL's:  Intact  Cognition: WNL     Assessment and Plan: Patient still has regular headaches however she feels she is doing other things to control her health in general.  She was recently seen by new neurologist who has recommended for her to consider seeing an osteopathic trained provider in the future. She had stopped taking Cymbalta however writer asked her to retry taking it every other day to see if that can help her reduce the drowsiness or other side effects.  1. 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  2. Chronic migraine without aura without status migrainosus, not intractable  3. ADHD (attention deficit hyperactivity disorder), inattentive type   Follow-up in 4 months.  [place] Crane, MD 12/17/2020, 11:30 AM

## 2020-12-25 ENCOUNTER — Other Ambulatory Visit: Payer: Self-pay

## 2020-12-25 ENCOUNTER — Ambulatory Visit: Payer: BC Managed Care – PPO | Admitting: Family Medicine

## 2020-12-25 ENCOUNTER — Ambulatory Visit (INDEPENDENT_AMBULATORY_CARE_PROVIDER_SITE_OTHER): Payer: BC Managed Care – PPO

## 2020-12-25 ENCOUNTER — Encounter: Payer: Self-pay | Admitting: Family Medicine

## 2020-12-25 VITALS — BP 128/84 | Ht 63.0 in | Wt 224.0 lb

## 2020-12-25 DIAGNOSIS — M542 Cervicalgia: Secondary | ICD-10-CM | POA: Diagnosis not present

## 2020-12-25 DIAGNOSIS — M999 Biomechanical lesion, unspecified: Secondary | ICD-10-CM

## 2020-12-25 DIAGNOSIS — M7918 Myalgia, other site: Secondary | ICD-10-CM | POA: Insufficient documentation

## 2020-12-25 DIAGNOSIS — M545 Low back pain, unspecified: Secondary | ICD-10-CM | POA: Diagnosis not present
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
## 2020-12-25 IMAGING — DX DG LUMBAR SPINE 2-3V
3 series · 3 of 3 positions shown · non-contrast
Comparison: None.

CLINICAL DATA: Chronic low back pain

EXAM:
LUMBAR SPINE - 2-3 VIEW

[l-spine ap]
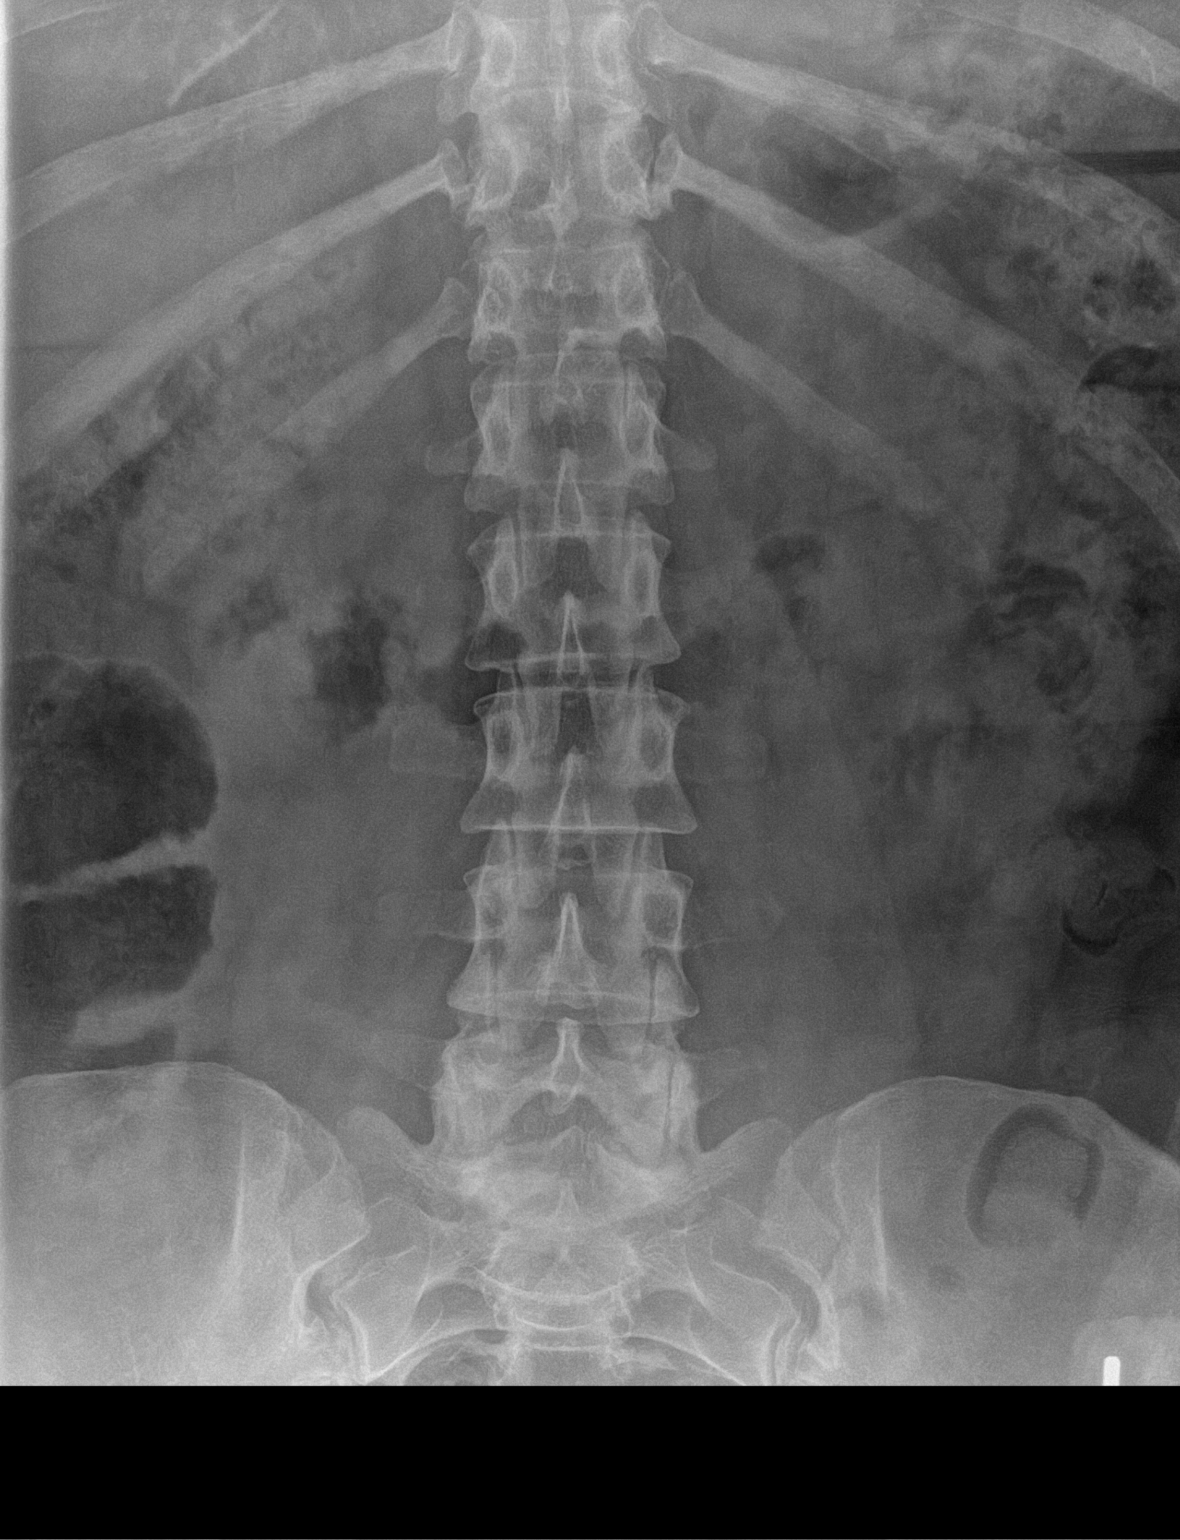

[l-spine lateral (1 of 2)]
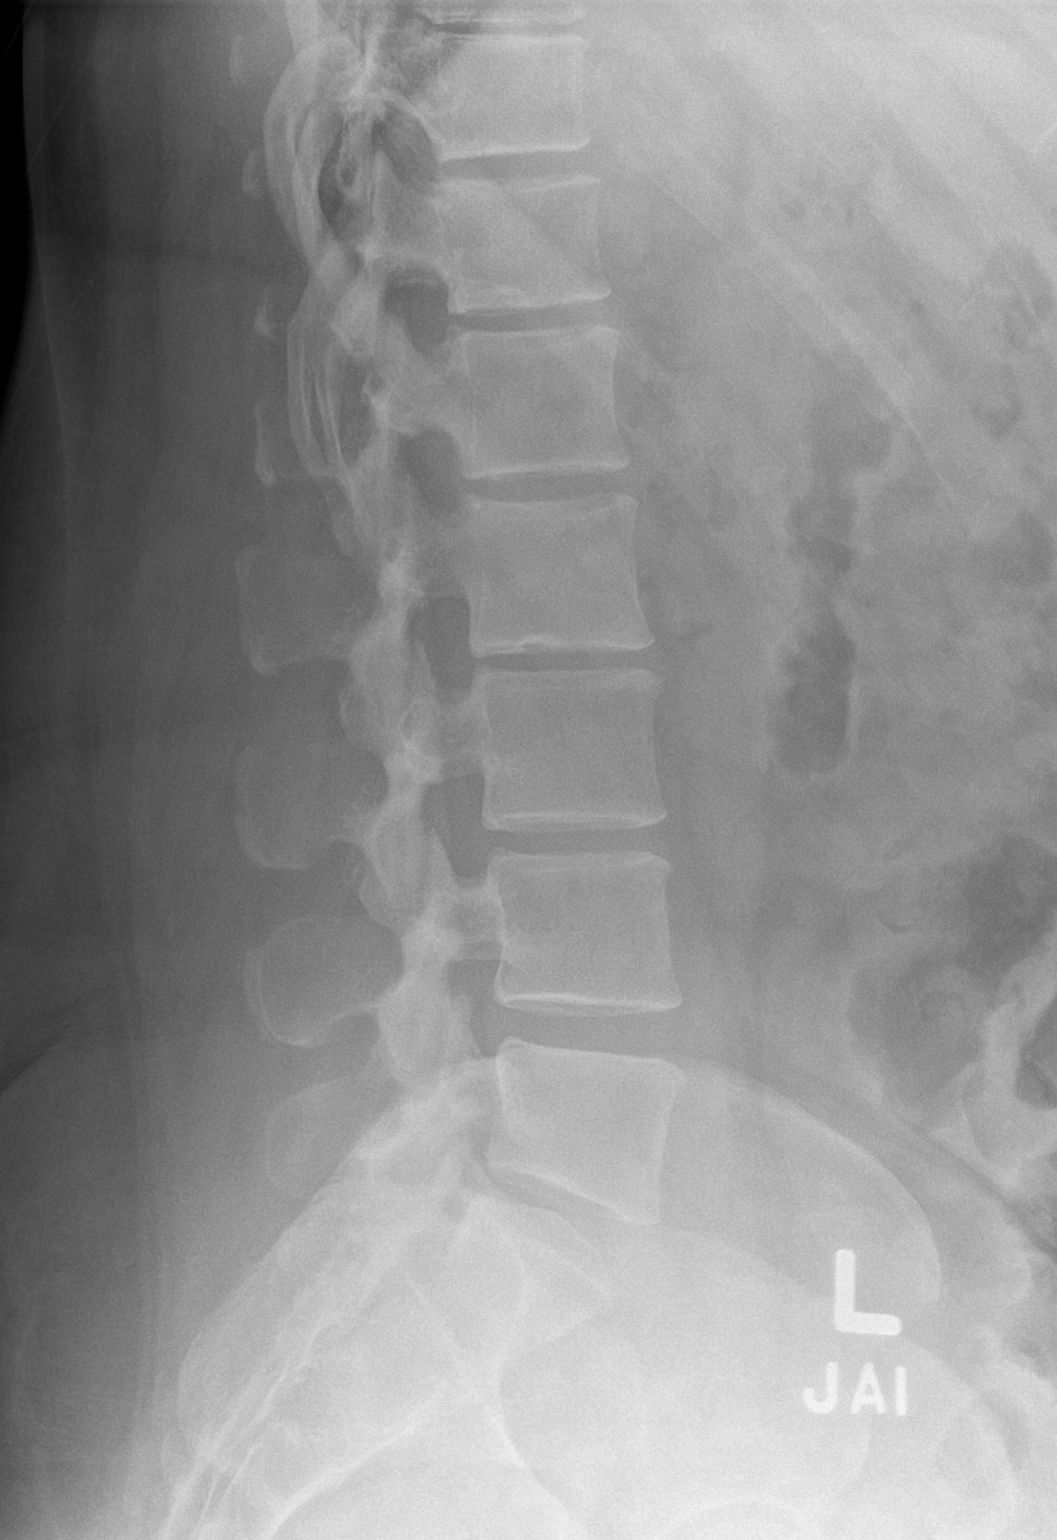

[l-spine lateral (2 of 2)]
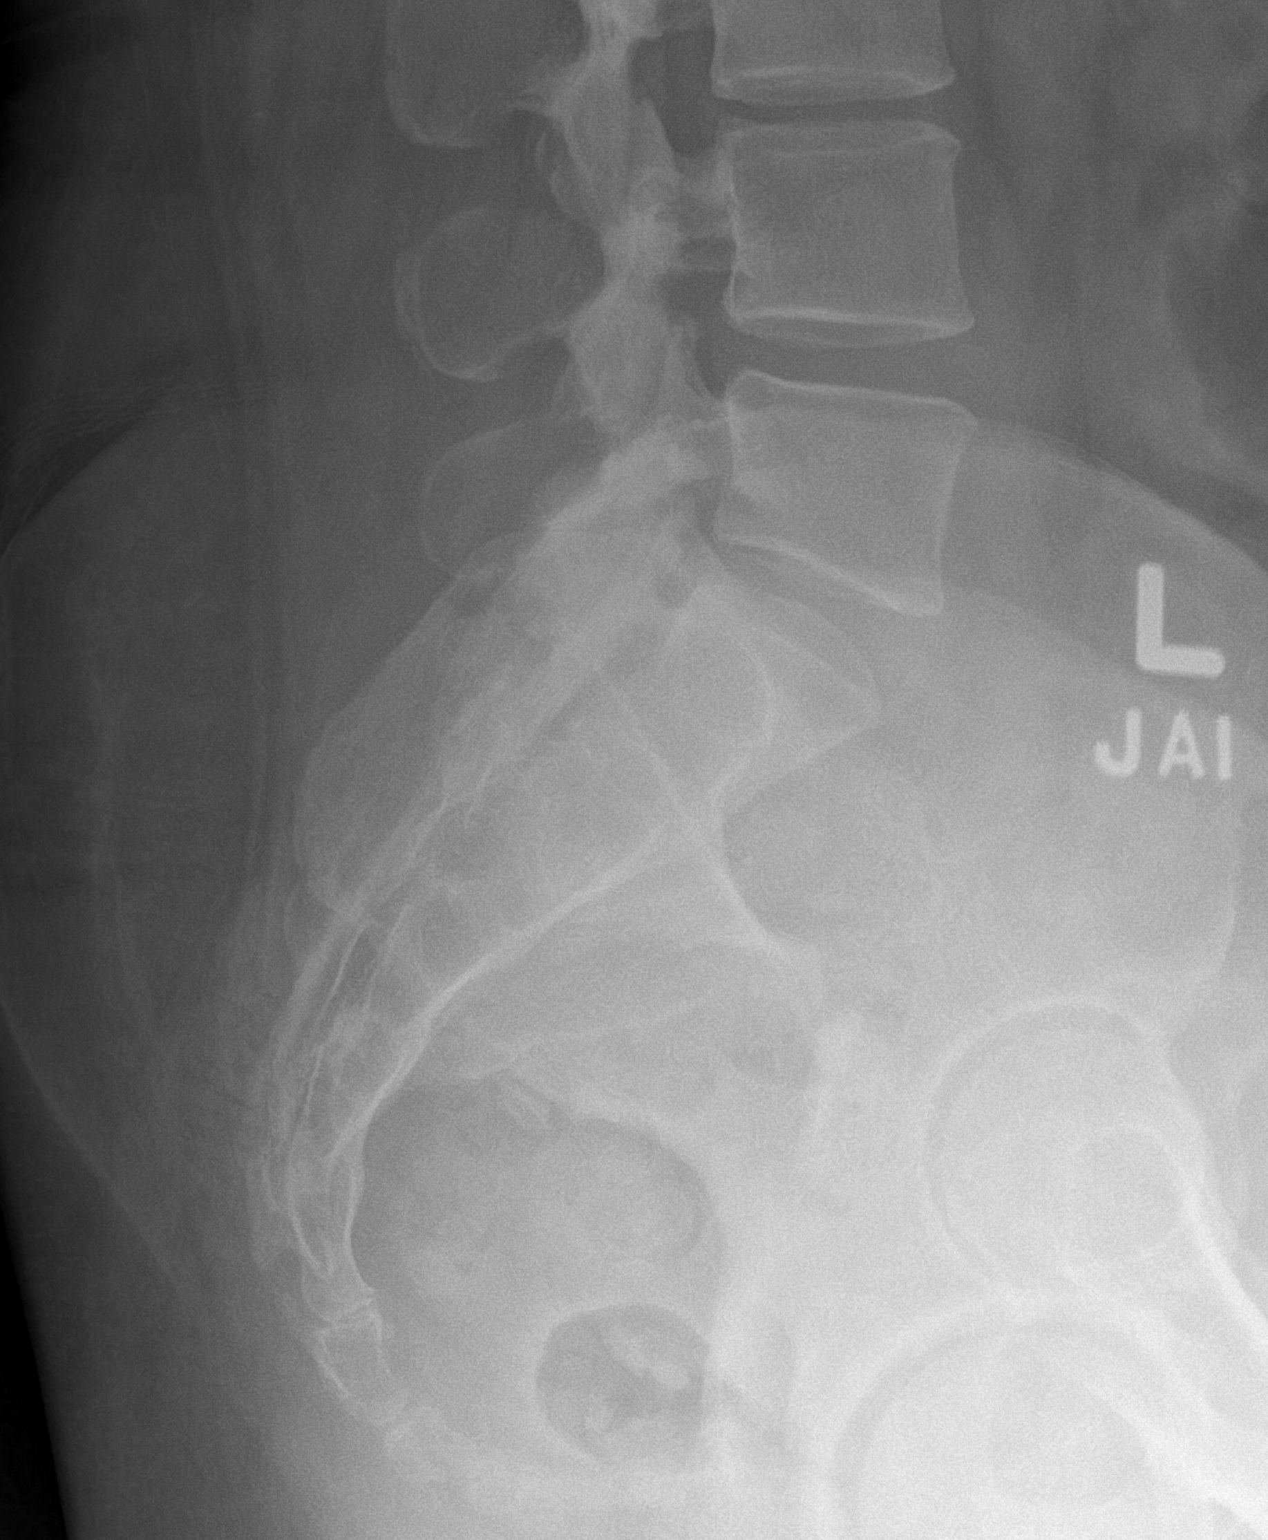

[3 of 3 positions shown; findings below may reference images not displayed]

FINDINGS: Normal alignment. Disc space narrowing at L2-3 and L3-4.
Degenerative facet disease at L4-5 and L5-S1. Normal alignment. No
fracture.
IMPRESSION: Degenerative disc and facet disease as above. No acute bony
abnormality.

## 2020-12-25 MED ORDER — GABAPENTIN 100 MG PO CAPS: 200.0000 mg | ORAL_CAPSULE | Freq: Every day | ORAL | 0 refills | Status: DC

## 2020-12-25 NOTE — Progress Notes (Signed)
Debbie Debbie [name] Phone: [phone] Subjective:   Debbie Debbie [name], am serving as a scribe for Dr. Hulan Debbie [name]. This visit occurred during the SARS-CoV-2 public health emergency.  Safety protocols were in place, including screening questions prior to the visit, additional usage of staff PPE, and extensive cleaning of exam room while observing appropriate contact time as indicated for disinfecting solutions.   I'm seeing this patient by the request  of:  Debbie Arabian, MD  CC: Neck pain, back pain  [id number]  Debbie Debbie [name] is a 40 y.o. female coming in with complaint of pain in her entire spine. Notes a constant soreness. Patient states that her pain comes and goes. Patient had lumbar RFA this past year which did not help a locking sensation that she was having. Patient now having cervical spine pain. History of headaches. Pop in cervical spine in December alleviated her pain in left hip. Now having right SI joint pain and a similar pain in her right cervical spine. Patient notes spasms that will travel from lumbar to thoracic spine. Has tried PT. Takes Flexeril and Gabapentin.  Patient states that the gabapentin she takes intermittently.  Patient states that the pain can stop her from activities but she is keeps trying to work.  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  Continues though to have pain on a daily basis.     Past Medical History:  Diagnosis Date  . Anxiety    self reported  . Depression    self reported  . Migraine   . Debbie [name] pertinent past medical history    Past Surgical History:  Procedure Laterality Date  . CESAREAN SECTION  2013  . laparscopy    . SHOULDER CAPSULORRHAPHY     Social History   Socioeconomic History  . Marital status: Divorced    Spouse name: Not on file  . Number of children: 1  . Years of education: Not on file  . Highest education level: Bachelor's  degree (e.g., BA, AB, BS)  Occupational History  . Not on file  Tobacco Use  . Smoking status: Never Smoker  . Smokeless tobacco: Never Used  Vaping Use  . Vaping Use: Never used  Substance and Sexual Activity  . Alcohol use: Debbie [name]  . Drug use: Debbie [name]  . Sexual activity: Yes    Birth control/protection: I.U.D.  Other Topics Concern  . Not on file  Social History Narrative   Lives at home with her son & his father   Right handed   Drinks 1 cup of caffeine daily   Social Determinants of Health   Financial Resource Strain: Not on file  Food Insecurity: Not on file  Transportation Needs: Not on file  Physical Activity: Not on file  Stress: Not on file  Social Connections: Not on file   Allergies  Allergen Reactions  . Tylenol [Acetaminophen] Anaphylaxis  . Aspirin Other (See Comments)    Unknown childhood rxn  . Benadryl [Diphenhydramine Hcl] Itching  . Dilaudid [Hydromorphone Hcl] Itching    Pill only  . Penicillins     hives  . Prednisone     Swelling, hives   Family History  Problem Relation Age of Onset  . Heart disease Maternal Grandfather   . Cerebral aneurysm Sister   . Migraines Sister        Current Outpatient Medications (Analgesics):  .  ibuprofen (ADVIL,MOTRIN) 600 MG tablet, Take  1 tablet (600 mg total) by mouth every 6 (six) hours as needed for moderate pain. .  Rimegepant Sulfate (NURTEC) 75 MG TBDP, Take 75 mg by mouth daily as needed (take for abortive therapy of migraine, Debbie [name] more than 1 tablet in 24 hours or 10 per month). .  SUMAtriptan (IMITREX) 6 MG/0.5ML SOLN injection, Inject 0.5 mLs (6 mg total) into the skin every 2 (two) hours as needed for migraine or headache. Debbie [name] more then 2 injections in 24hrs   Current Outpatient Medications (Other):  Marland Kitchen  Botulinum Toxin Type A (BOTOX) 200 units SOLR, Inject 155 Units as directed every 3 (three) months. Inject 155units to head and neck intramuscularly every 3 months. .  cyclobenzaprine (FLEXERIL) 5 MG  tablet, TAKE 1 TABLET BY MOUTH THREE TIMES A DAY AS NEEDED FOR MUSCLE SPASMS .  DULoxetine (CYMBALTA) 30 MG capsule, TAKE 1 CAPSULE BY MOUTH EVERY DAY .  gabapentin (NEURONTIN) 100 MG capsule, Take 2 capsules (200 mg total) by mouth at bedtime. .  gabapentin (NEURONTIN) 300 MG capsule, Take 300 mg by mouth 3 (three) times daily. .  prochlorperazine (COMPAZINE) 10 MG tablet, Take 1 tablet (10 mg total) by mouth every 6 (six) hours as needed for nausea or vomiting.   Reviewed prior external information including notes and imaging from  primary care provider As well as notes that were available from care everywhere and other healthcare systems.  Past medical history, social, surgical and family history all reviewed in electronic medical record.  Debbie [name] pertanent information unless stated regarding to the chief complaint.   Review of Systems:  Debbie [name] headache, visual changes, nausea, vomiting, diarrhea, constipation, dizziness, abdominal pain, skin rash, fevers, chills, night sweats, weight loss, swollen lymph nodes, body aches, joint swelling, chest pain, shortness of breath, mood changes. POSITIVE muscle aches  Objective  Blood pressure 128/84, height 5\' 3"  (1.6 m), weight 224 lb (101.6 kg), last menstrual period 12/17/2020, unknown if currently breastfeeding.   General: Debbie [name] apparent distress alert and oriented x3 mood and affect normal, dressed appropriately.  HEENT: Pupils equal, extraocular movements intact  Respiratory: Patient's speak in full sentences and does not appear short of breath  Cardiovascular: Debbie [name] lower extremity edema, non tender, Debbie [name] erythema  MSK: Low back exam does have some loss of lordosis.  Patient does have some tightness with Debbie Debbie [name] right greater than left.  Tenderness over the right sacroiliac joint.  Negative straight leg test.  Limited range of motion secondary to voluntary guarding of the back. Neck exam tightness more in the cervical thoracic area.  Debbie [name] midline tenderness.   Negative Spurling's.  Tightness of the trapezius bilaterally.  Osteopathic findings C2 flexed rotated and side bent right C4 flexed rotated and side bent left T5 extended rotated and side bent right inhaled third rib L3. flexed rotated and side bent right Sacrum right on right  [id number]; 15 additional minutes spent for Therapeutic exercises as stated in above notes.  This included exercises focusing on stretching, strengthening, with significant focus on eccentric aspects.   Long term goals include an improvement in range of motion, strength, endurance as well as avoiding reinjury. Patient's frequency would include in 1-2 times a day, 3-5 times a week for a duration of 6-12 weeks. Low back exercises that included:  Pelvic tilt/bracing instruction to focus on control of the pelvic girdle and lower abdominal muscles  Glute strengthening exercises, focusing on proper firing of the glutes without engaging the low back muscles Proper stretching techniques for maximum relief  for the hamstrings, hip flexors, low back and some rotation where tolerated   Proper technique shown and discussed handout in great detail with ATC.  All questions were discussed and answered.      Impression and Recommendations:     The above documentation has been reviewed and is accurate and complete Debbie Pulley, DO

## 2020-12-25 NOTE — Assessment & Plan Note (Signed)
Patient seems to have more of a myofascial pain as well as some cervicogenic headaches.  Patient does do fairly well with gabapentin and given her 200 mg to take at night.  We discussed with patient about posture in the 90s, x-rays ordered to further evaluate any bony abnormalities the patient has had further work-up including radiofrequency ablation with no significant benefit.  Attempted osteopathic manipulation today and patient had some mild improvement in range of motion immediately.  May need to consider the possibility of repeating formal physical therapy which patient has done before as well.  Follow-up with me again in 6 weeks for further evaluation and treatment

## 2020-12-25 NOTE — Patient Instructions (Addendum)
Xrays today Continue vitamins Gabapentin 200mg  at night Core strength is key See me in 5-6 weeks

## 2020-12-25 NOTE — Assessment & Plan Note (Signed)
Decision today to treat with OMT was based on Physical Exam  After verbal consent patient was treated with HVLA, ME, FPR techniques in cervical, thoracic, rib,  lumbar and sacral areas  Patient tolerated the procedure well with improvement in symptoms  Patient given exercises, stretches and lifestyle modifications  See medications in patient instructions if given  Patient will follow up in 4-8 weeks 

## 2021-01-28 ENCOUNTER — Telehealth: Payer: Self-pay | Admitting: Family Medicine

## 2021-01-28 NOTE — Telephone Encounter (Signed)
Patient has a Botox appointment on 3/22. I called [facility] and spoke with Sabino Donovan to check order status. She states Botox TBD 3/9.

## 2021-01-28 NOTE — Progress Notes (Signed)
Debbie Debbie [name] Phone: [phone] Subjective:   Debbie Debbie [name], am serving as a scribe for Dr. Hulan Saas. This visit occurred during the SARS-CoV-2 public health emergency.  Safety protocols were in place, including screening questions prior to the visit, additional usage of staff PPE, and extensive cleaning of exam room while observing appropriate contact time as indicated for disinfecting solutions.   I'm seeing this patient by the request  of:  Debbie Arabian, MD  CC: Back and neck pain follow-up  [id number]  Debbie Debbie [name] is a 40 y.o. female coming in with complaint of back and neck pain. OMT 12/25/2020. Patient states that she has been having good and bad days since last visit.  Patient states that some mild dull ache, patient states that she can have some pain that can stop her from activity.  Seems to be more in the lower back.  Still has so if she does too much extension  Medications patient has been prescribed: Gabapentin  Taking: Intermittently         Reviewed prior external information including notes and imaging from previsou exam, outside providers and external EMR if available.   As well as notes that were available from care everywhere and other healthcare systems.  Past medical history, social, surgical and family history all reviewed in electronic medical record.  Debbie [name] pertanent information unless stated regarding to the chief complaint.   Past Medical History:  Diagnosis Date  . Anxiety    self reported  . Depression    self reported  . Migraine   . Debbie [name] pertinent past medical history     Allergies  Allergen Reactions  . Tylenol [Acetaminophen] Anaphylaxis  . Aspirin Other (See Comments)    Unknown childhood rxn  . Benadryl [Diphenhydramine Hcl] Itching  . Dilaudid [Hydromorphone Hcl] Itching    Pill only  . Penicillins     hives  . Prednisone     Swelling, hives     Review  of Systems:  Debbie [name]  visual changes, nausea, vomiting, diarrhea, constipation,  abdominal pain, skin rash, fevers, chills, night sweats, weight loss, swollen lymph nodes, joint swelling, chest pain, shortness of breath, mood changes. POSITIVE muscle aches, headache, dizziness, body aches  Objective  Blood pressure 112/84, pulse 84, height 5\' 3"  (1.6 m), weight 228 lb (103.4 kg), SpO2 98 %, unknown if currently breastfeeding.   General: Debbie [name] apparent distress alert and oriented x3 mood and affect normal, dressed appropriately.  HEENT: Pupils equal, extraocular movements intact  Respiratory: Patient's speak in full sentences and does not appear short of breath  Cardiovascular: Debbie [name] lower extremity edema, non tender, Debbie [name] erythema  Gait normal with good balance and coordination.  MSK:  Non tender with full range of motion and good stability and symmetric strength and tone of shoulders, elbows, wrist, hip, knee and ankles bilaterally.  Back -patient's neck exam does have some very mild loss of lordosis.  Tightness at the occipital region noted.  Negative Spurling's noted.  Near full range of motion Debbie [name] otherwise noted.  5 out of 5 strength of the upper extremities.  Low back exam shows the patient does have some poor core strength.  Still some mild loss of lordosis.  Patient does have tenderness to palpation in the paraspinal musculature.  Tightness noted with FABER test bilaterally right greater than left.  Osteopathic findings  C2 flexed rotated and side bent right T6 extended rotated and side bent right  inhaled rib L2 flexed rotated and side bent right Sacrum right on right       Assessment and Plan:  Myofascial pain dysfunction syndrome Continues to have myofascial pain as well as significant amount of tightness of the back.  I do think that patient's lower back pain as well.  Patient does have degenerative disc disease with facet arthritis and may need to consider the possibility of congenital  spinal stenosis.  We discussed with patient about labs and imaging which patient declined today.  Depending on how patient responds to the conservative therapy including the bowel prep we discussed, gabapentin will consider the labs in the advanced imaging at follow-up.  Follow-up with me again 6 weeks  Degenerative disc disease, lumbar Patient does have degenerative disc disease.  Facet arthropathy noted on x-ray.  Worsening symptoms with patient's family history significant for spinal stenosis consider the possibility of MRIs if this continues.  Would also consider laboratory work-up.  Patient will increase activity slowly.  Follow-up with me again 6 weeks    Nonallopathic problems  Decision today to treat with OMT was based on Physical Exam  After verbal consent patient was treated with HVLA, ME, FPR techniques in cervical, rib, thoracic, lumbar, and sacral  areas  Patient tolerated the procedure well with improvement in symptoms  Patient given exercises, stretches and lifestyle modifications  See medications in patient instructions if given  Patient will follow up in 4-8 weeks      The above documentation has been reviewed and is accurate and complete Debbie Pulley, DO       Note: This dictation was prepared with Dragon dictation along with smaller phrase technology. Any transcriptional errors that result from this process are unintentional.

## 2021-01-29 ENCOUNTER — Ambulatory Visit: Payer: BC Managed Care – PPO | Admitting: Family Medicine

## 2021-01-29 ENCOUNTER — Telehealth: Payer: Self-pay | Admitting: Family Medicine

## 2021-01-29 ENCOUNTER — Encounter: Payer: Self-pay | Admitting: Family Medicine

## 2021-01-29 ENCOUNTER — Other Ambulatory Visit: Payer: Self-pay

## 2021-01-29 VITALS — BP 112/84 | HR 84 | Ht 63.0 in | Wt 228.0 lb

## 2021-01-29 DIAGNOSIS — M7918 Myalgia, other site: Secondary | ICD-10-CM

## 2021-01-29 DIAGNOSIS — M999 Biomechanical lesion, unspecified: Secondary | ICD-10-CM

## 2021-01-29 DIAGNOSIS — M5136 Other intervertebral disc degeneration, lumbar region: Secondary | ICD-10-CM

## 2021-01-29 NOTE — Patient Instructions (Signed)
Mirilax 17 g daily with 100mg  of colace Use gabapentin as needed See me again in 6 weeks, if not better will talk about other labs and MRI

## 2021-01-29 NOTE — Assessment & Plan Note (Signed)
Continues to have myofascial pain as well as significant amount of tightness of the back.  I do think that patient's lower back pain as well.  Patient does have degenerative disc disease with facet arthritis and may need to consider the possibility of congenital spinal stenosis.  We discussed with patient about labs and imaging which patient declined today.  Depending on how patient responds to the conservative therapy including the bowel prep we discussed, gabapentin will consider the labs in the advanced imaging at follow-up.  Follow-up with me again 6 weeks

## 2021-01-29 NOTE — Telephone Encounter (Signed)
I called [facility] and spoke with Truman Hayward to advise patient's insurance is still the same, patient just has a new ID number. I provided this to him and he states he will put it through for processing.

## 2021-01-29 NOTE — Assessment & Plan Note (Signed)
Patient does have degenerative disc disease.  Facet arthropathy noted on x-ray.  Worsening symptoms with patient's family history significant for spinal stenosis consider the possibility of MRIs if this continues.  Would also consider laboratory work-up.  Patient will increase activity slowly.  Follow-up with me again 6 weeks

## 2021-01-29 NOTE — Telephone Encounter (Signed)
Erline Levine called from [facility] do [facility] and relayed she will not be able to ship Botox because insurance has termed . [phone]

## 2021-01-31 NOTE — Telephone Encounter (Signed)
See note from 3/8; I called [facility] today and spoke with Beverlee Nims to schedule delivery. Botox TBD 3/15.

## 2021-02-01 ENCOUNTER — Encounter: Payer: Self-pay | Admitting: Family Medicine

## 2021-02-06 NOTE — Telephone Encounter (Signed)
Botox did not arrive yesterday as scheduled. I called [facility] and spoke to Oakley to discuss. She states the order was stopped due to an issue with insurance. She states the order is processing and they will call once the order is ready.

## 2021-02-11 NOTE — Telephone Encounter (Signed)
I called [facility] and spoke with Jasmine to check order status. She states [facility]'s computers are down and are expected to be back up in 3 hours.

## 2021-02-11 NOTE — Telephone Encounter (Signed)
I called [facility] and was able to speak to Leda Gauze to check order status. Leda Gauze states that the order is still not ready. It could be ready by tomorrow, but she isn't certain. No word as to why this has taken so long. I called the patient to reschedule. No answer, but I LVM. I will try to catch her again before she was supposed to come in tomorrow.

## 2021-02-12 ENCOUNTER — Ambulatory Visit: Payer: BC Managed Care – PPO | Admitting: Family Medicine

## 2021-02-12 NOTE — Telephone Encounter (Signed)
Was able to make contact with patient to reschedule her appointment to 3/24. I called [facility] and spoke with Marlowe Kays. She states that the order is ready to go out but patient needs to give consent. She placed me on hold and attempted to reach the patient. She was not able to reach her but LVM. After our call, I called the patient. I was able to reach her. She stated that she will give them a call.

## 2021-02-12 NOTE — Telephone Encounter (Signed)
See telephone note from 3/7

## 2021-02-12 NOTE — Telephone Encounter (Signed)
Botox TBD 3/23.

## 2021-02-13 NOTE — Telephone Encounter (Signed)
Received (1) 200 unit vial of Botox today from [facility]. 

## 2021-02-14 ENCOUNTER — Ambulatory Visit: Payer: BC Managed Care – PPO | Admitting: Family Medicine

## 2021-02-14 DIAGNOSIS — G43709 Chronic migraine without aura, not intractable, without status migrainosus: Secondary | ICD-10-CM | POA: Diagnosis not present

## 2021-02-14 NOTE — Progress Notes (Signed)
Botox- 200 units x 1 vial Lot: [id number] Expiration: 12/24 NDC: 0923-3007-62  Bacteriostatic 0.9% Sodium Chloride- 60mL total [id number] Expiration: 2/23 NDC: 56389-373-42  Dx: A76.811 SP

## 2021-02-14 NOTE — Progress Notes (Signed)
02/14/2021 ALL: She continues Botox and sumatriptan/Nurtec. She was seen by Dr Loretta Plume in 11/2020 for second opinion. He recommended she consider switching Nurtec to [place] daily versus discontinuing Botox and Nurtec and start Vypeti q46m. She wishes to remain on current treatment plan. Usually has 1-2 migraines per month until week prior to and after Botox when she has 2-3 per week.   11/08/2020 ALL: She is doing well. She has about 4 migraines per month. Migraines worsen the week prior to Botox being due.   Consent Form Botulism Toxin Injection For Chronic Migraine   Reviewed orally with patient, additionally signature is on file:  Botulism toxin has been approved by the Federal drug administration for treatment of chronic migraine. Botulism toxin does not cure chronic migraine and it may not be effective in some patients.  The administration of botulism toxin is accomplished by injecting a small amount of toxin into the muscles of the neck and head. Dosage must be titrated for each individual. Any benefits resulting from botulism toxin tend to wear off after 3 months with a repeat injection required if benefit is to be maintained. Injections are usually done every 3-4 months with maximum effect peak achieved by about 2 or 3 weeks. Botulism toxin is expensive and you should be sure of what costs you will incur resulting from the injection.  The side effects of botulism toxin use for chronic migraine may include:   -Transient, and usually mild, facial weakness with facial injections  -Transient, and usually mild, head or neck weakness with head/neck injections  -Reduction or loss of forehead facial animation due to forehead muscle weakness  -Eyelid drooping  -Dry eye  -Pain at the site of injection or bruising at the site of injection  -Double vision  -Potential unknown long term risks   Contraindications: You should not have Botox if you are pregnant, nursing, allergic to albumin, have  an infection, skin condition, or muscle weakness at the site of the injection, or have myasthenia gravis, Lambert-Eaton syndrome, or ALS.  It is also possible that as with any injection, there may be an allergic reaction or no effect from the medication. Reduced effectiveness after repeated injections is sometimes seen and rarely infection at the injection site may occur. All care will be taken to prevent these side effects. If therapy is given over a long time, atrophy and wasting in the muscle injected may occur. Occasionally the patient's become refractory to treatment because they develop antibodies to the toxin. In this event, therapy needs to be modified.  I have read the above information and consent to the administration of botulism toxin.    BOTOX PROCEDURE NOTE FOR MIGRAINE HEADACHE  Contraindications and precautions discussed with patient(above). Aseptic procedure was observed and patient tolerated procedure. Procedure performed by [name], FNP-C.   The condition has existed for more than 6 months, and pt does not have a diagnosis of ALS, Myasthenia Gravis or Lambert-Eaton Syndrome.  Risks and benefits of injections discussed and pt agrees to proceed with the procedure.  Written consent obtained  These injections are medically necessary. Pt  receives good benefits from these injections. These injections do not cause sedations or hallucinations which the oral therapies may cause.   Description of procedure:  The patient was placed in a sitting position. The standard protocol was used for Botox as follows, with 5 units of Botox injected at each site:  -Procerus muscle, midline injection  -Corrugator muscle, bilateral injection  -Frontalis muscle, bilateral  injection, with 2 sites each side, medial injection was performed in the upper one third of the frontalis muscle, in the region vertical from the medial inferior edge of the superior orbital rim. The lateral injection was again  in the upper one third of the forehead vertically above the lateral limbus of the cornea, 1.5 cm lateral to the medial injection site.  -Temporalis muscle injection, 4 sites, bilaterally. The first injection was 3 cm above the tragus of the ear, second injection site was 1.5 cm to 3 cm up from the first injection site in line with the tragus of the ear. The third injection site was 1.5-3 cm forward between the first 2 injection sites. The fourth injection site was 1.5 cm posterior to the second injection site. 5th site laterally in the temporalis  muscleat the level of the outer canthus.  -Occipitalis muscle injection, 3 sites, bilaterally. The first injection was done one half way between the occipital protuberance and the tip of the mastoid process behind the ear. The second injection site was done lateral and superior to the first, 1 fingerbreadth from the first injection. The third injection site was 1 fingerbreadth superiorly and medially from the first injection site.  -Cervical paraspinal muscle injection, 2 sites, bilaterally. The first injection site was 1 cm from the midline of the cervical spine, 3 cm inferior to the lower border of the occipital protuberance. The second injection site was 1.5 cm superiorly and laterally to the first injection site.  -Trapezius muscle injection was performed at 3 sites, bilaterally. The first injection site was in the upper trapezius muscle halfway between the inflection point of the neck, and the acromion. The second injection site was one half way between the acromion and the first injection site. The third injection was done between the first injection site and the inflection point of the neck.   Will return for repeat injection in 3 months.   A total of 200 units of Botox was prepared, 155 units of Botox was injected as documented above, any Botox not injected was wasted. The patient tolerated the procedure well, there were no complications of the above  procedure.   Made any corrections needed, and agree with procedure   Sarina Ill, MD [facility]

## 2021-02-15 ENCOUNTER — Other Ambulatory Visit: Payer: Self-pay

## 2021-02-15 ENCOUNTER — Emergency Department (HOSPITAL_COMMUNITY): Payer: BC Managed Care – PPO

## 2021-02-15 ENCOUNTER — Emergency Department (HOSPITAL_COMMUNITY)
Admission: EM | Admit: 2021-02-15 | Discharge: 2021-02-16 | Disposition: A | Discharge status: Home / Self Care | Payer: BC Managed Care – PPO | Attending: Emergency Medicine | Admitting: Emergency Medicine

## 2021-02-15 DIAGNOSIS — R42 Dizziness and giddiness: Secondary | ICD-10-CM | POA: Diagnosis not present

## 2021-02-15 DIAGNOSIS — R000 Tachycardia, unspecified: Secondary | ICD-10-CM | POA: Diagnosis not present

## 2021-02-15 DIAGNOSIS — R531 Weakness: Secondary | ICD-10-CM | POA: Insufficient documentation

## 2021-02-15 LAB — URINALYSIS, ROUTINE W REFLEX MICROSCOPIC
Bilirubin Urine: NEGATIVE
Glucose, UA: NEGATIVE mg/dL
Hgb urine dipstick: NEGATIVE
Ketones, ur: NEGATIVE mg/dL
Leukocytes,Ua: NEGATIVE
Nitrite: NEGATIVE
Protein, ur: NEGATIVE mg/dL
Specific Gravity, Urine: 1.009 (ref 1.005–1.030)
pH: 5 (ref 5.0–8.0)

## 2021-02-15 LAB — COMPREHENSIVE METABOLIC PANEL
ALT: 28 U/L (ref 0–44)
AST: 21 U/L (ref 15–41)
Albumin: 4 g/dL (ref 3.5–5.0)
Alkaline Phosphatase: 58 U/L (ref 38–126)
Anion gap: 9 (ref 5–15)
BUN: 8 mg/dL (ref 6–20)
CO2: 24 mmol/L (ref 22–32)
Calcium: 9.1 mg/dL (ref 8.9–10.3)
Chloride: 103 mmol/L (ref 98–111)
Creatinine, Ser: 0.9 mg/dL (ref 0.44–1.00)
GFR, Estimated: >60 mL/min (ref >60)
Glucose, Bld: 173 mg/dL — ABNORMAL HIGH (ref 70–99)
Potassium: 3.3 mmol/L — ABNORMAL LOW (ref 3.5–5.1)
Sodium: 136 mmol/L (ref 135–145)
Total Bilirubin: 0.4 mg/dL (ref 0.3–1.2)
Total Protein: 7.2 g/dL (ref 6.5–8.1)

## 2021-02-15 LAB — CBC
HCT: 44.9 % (ref 36.0–46.0)
Hemoglobin: 15.5 g/dL — ABNORMAL HIGH (ref 12.0–15.0)
MCH: 30 pg (ref 26.0–34.0)
MCHC: 34.5 g/dL (ref 30.0–36.0)
MCV: 86.8 fL (ref 80.0–100.0)
Platelets: 355 10*3/uL (ref 150–400)
RBC: 5.17 MIL/uL — ABNORMAL HIGH (ref 3.87–5.11)
RDW: 12.3 % (ref 11.5–15.5)
WBC: 15.8 10*3/uL — ABNORMAL HIGH (ref 4.0–10.5)
nRBC: 0 % (ref 0.0–0.2)

## 2021-02-15 LAB — LIPASE, BLOOD: Lipase: 39 U/L (ref 11–51)

## 2021-02-15 LAB — I-STAT BETA HCG BLOOD, ED (MC, WL, AP ONLY): I-stat hCG, quantitative: <5 m[IU]/mL (ref <5)

## 2021-02-15 IMAGING — CT CT ANGIO HEAD
1 of 11 series · 5 of 33 positions shown · IV contrast (omnipaque)
Comparison: None.

CLINICAL DATA: Initial evaluation for neuro deficit, stroke
suspected.

EXAM:
CT ANGIOGRAPHY HEAD AND NECK
TECHNIQUE: Multidetector CT imaging of the head and neck was performed using
the standard protocol during bolus administration of intravenous
contrast. Multiplanar CT image reconstructions and MIPs were
obtained to evaluate the vascular anatomy. Carotid stenosis
measurements (when applicable) are obtained utilizing NASCET
criteria, using the distal internal carotid diameter as the
denominator.
CONTRAST:  75mL OMNIPAQUE IOHEXOL 350 MG/ML SOLN

[Series 9: cta neck axial · axial · 0.39mm/px · z∈[+1331,+1547]mm · 5 of 324 slices shown]
[im 54/324  soft-tissue]
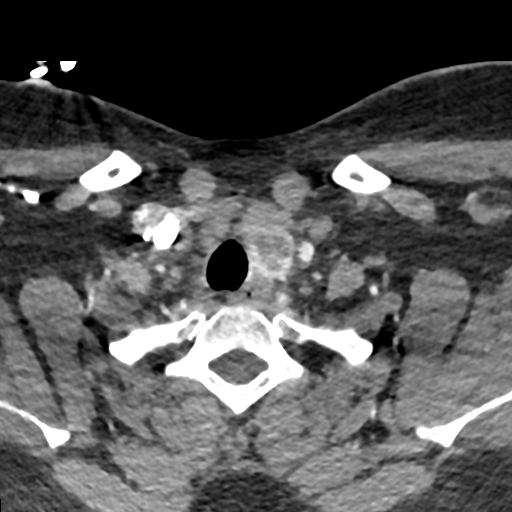
[im 108/324  bone]
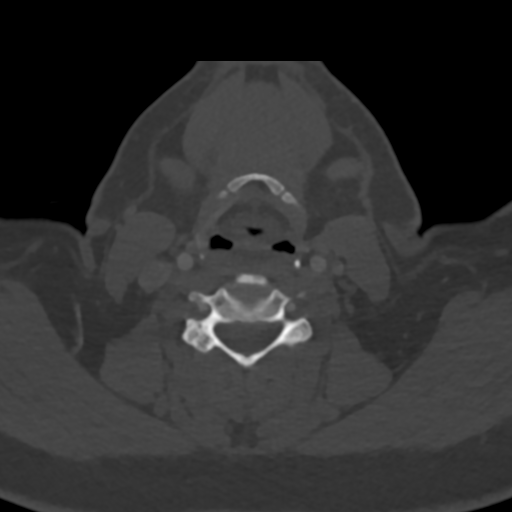
[im 162/324  soft-tissue]
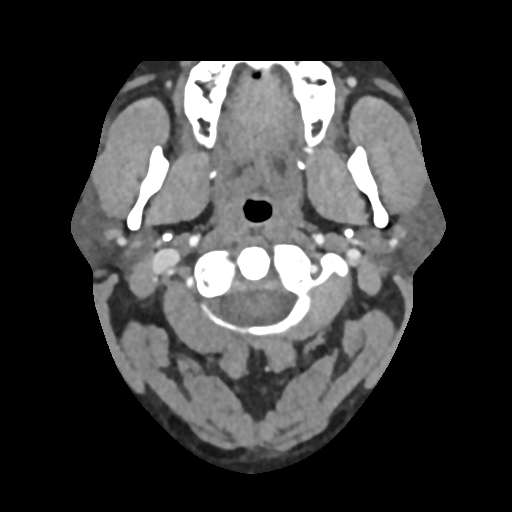
[im 216/324  bone]
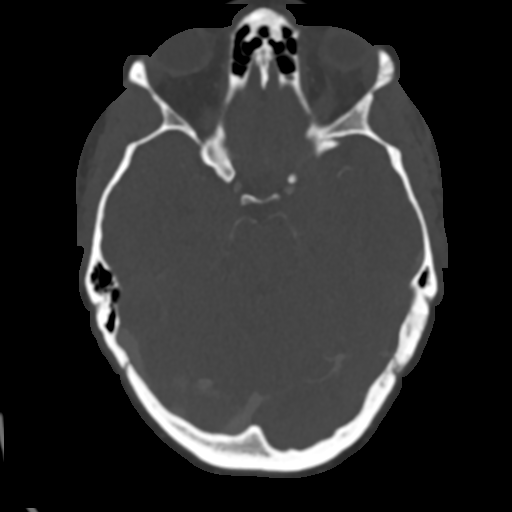
[im 270/324  soft-tissue]
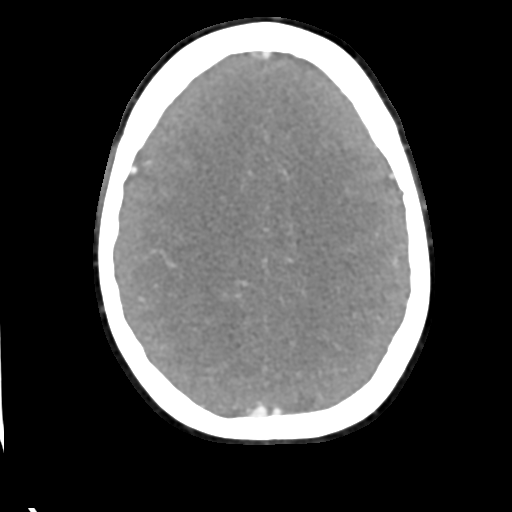

[5 of 33 positions shown; findings below may reference images not displayed]

FINDINGS: CT HEAD FINDINGS

Brain: Cerebral volume within normal limits for patient age.

No evidence for acute intracranial hemorrhage. No findings to
suggest acute large vessel territory infarct. No mass lesion,
midline shift, or mass effect. Ventricles are normal in size without
evidence for hydrocephalus. No extra-axial fluid collection
identified.

Vascular: No hyperdense vessel identified.

Skull: Scalp soft tissues demonstrate no acute abnormality.
Calvarium intact.

Sinuses/Orbits: Globes and orbital soft tissues within normal
limits.

Left maxillary sinus retention cyst. Paranasal sinuses are otherwise
clear. No mastoid effusion.

CTA NECK FINDINGS

Aortic arch: Visualized aortic arch normal caliber with normal
branch pattern. No hemodynamically significant stenosis about the
origin the great vessels. Visualized subclavian arteries widely
patent.

Right carotid system: Right common and internal carotid arteries
widely patent without stenosis, dissection or occlusion.

Left carotid system: Left common and internal carotid arteries
widely patent without stenosis, dissection or occlusion.

Vertebral arteries: Both vertebral arteries arise from the
subclavian arteries. No proximal subclavian artery stenosis. Both
vertebral arteries widely patent without stenosis, dissection or
occlusion.

Skeleton: Degenerative spondylosis at C5-6 with resultant mild
spinal stenosis. No visible acute osseous finding. No discrete or
worrisome osseous lesions.

Other neck: No other acute soft tissue abnormality within the neck.
1.8 cm left thyroid nodule present (series 7, image 135). Additional
9 mm right thyroid nodule (series 7, image 128). No other mass or
adenopathy.

Upper chest: Visualized upper chest demonstrates no acute finding.

Review of the MIP images confirms the above findings

CTA HEAD FINDINGS

Anterior circulation: Both internal carotid arteries widely patent
to the termini without stenosis. A1 segments widely patent. Normal
anterior communicating artery complex. Both anterior cerebral
arteries widely patent to their distal aspects without stenosis. No
M1 stenosis or occlusion. Normal MCA bifurcations. Distal MCA
branches well perfused and symmetric.

Posterior circulation: Both V4 segments patent to the
vertebrobasilar junction without stenosis. Both PICA origins patent
and normal. Basilar widely patent to its distal aspect without
stenosis. Superior cerebellar arteries patent bilaterally. Both PCAs
primarily supplied via the basilar and are well perfused to there
distal aspects.

Venous sinuses: Grossly patent allowing for timing the contrast
bolus.

Anatomic variants: None significant.  No visible aneurysm.

Review of the MIP images confirms the above findings
IMPRESSION: CT HEAD IMPRESSION:

Normal head CT.  No acute intracranial abnormality.

CTA HEAD AND NECK IMPRESSION:

1. Normal CTA of the head and neck. No large vessel occlusion,
hemodynamically significant stenosis, or other acute vascular
abnormality.
2. 1.8 cm left thyroid nodule. Further evaluation with dedicated
thyroid ultrasound recommended. This could be performed on a
nonemergent outpatient basis. (Ref: [HOSPITAL]. [LB]

## 2021-02-15 IMAGING — DX DG CHEST 1V PORT
1 series · 1 of 1 positions shown · non-contrast
Comparison: [DATE]

CLINICAL DATA: Tachycardia and shortness of breath

EXAM:
PORTABLE CHEST 1 VIEW

[chest ap]
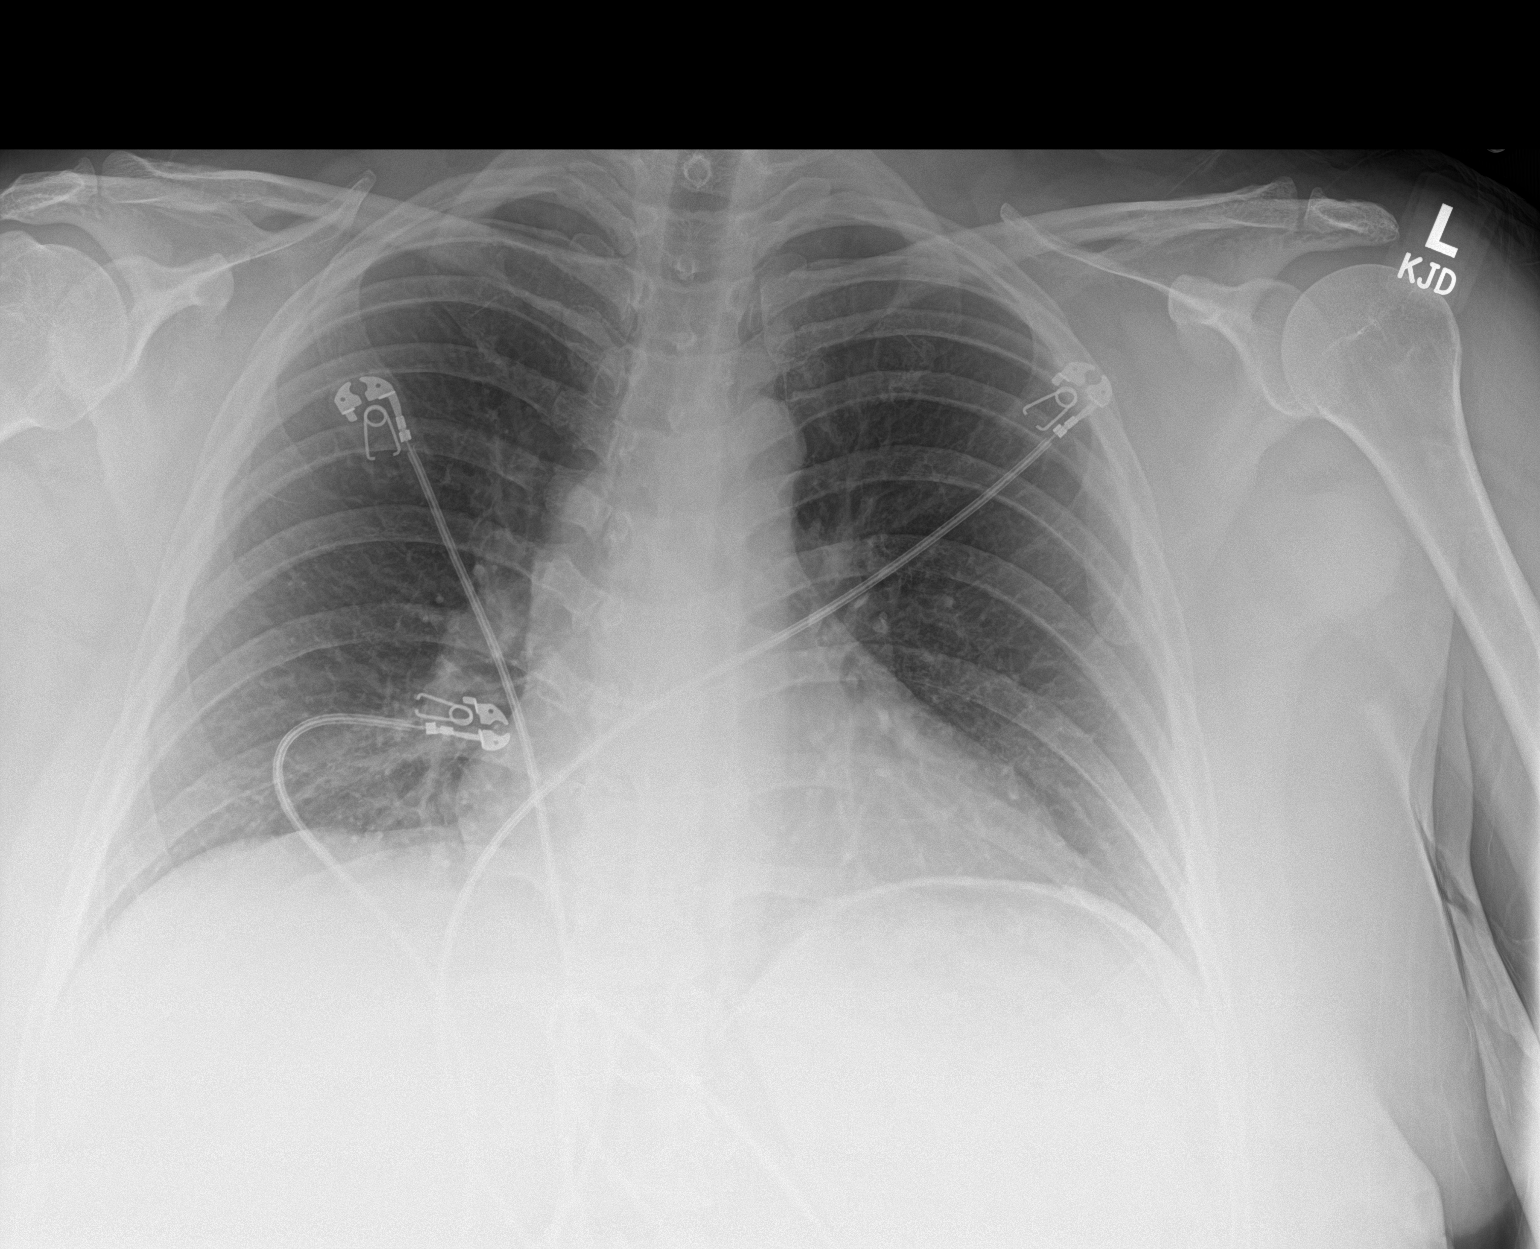

[1 of 1 positions shown; findings below may reference images not displayed]

FINDINGS: The heart size and mediastinal contours are within normal limits.
Both lungs are clear. The visualized skeletal structures are
unremarkable.
IMPRESSION: No active disease.

## 2021-02-15 MED ORDER — LACTATED RINGERS IV BOLUS
1000.0000 mL | Freq: Once | INTRAVENOUS | Status: AC
  Administered 2021-02-15: 1000 mL via INTRAVENOUS

## 2021-02-15 MED ORDER — LORAZEPAM 2 MG/ML IJ SOLN
2.0000 mg | Freq: Once | INTRAMUSCULAR | Status: AC
  Administered 2021-02-15: 2 mg via INTRAVENOUS
  Filled 2021-02-15: qty 1

## 2021-02-15 NOTE — ED Notes (Signed)
Neurologist at bedside. 

## 2021-02-15 NOTE — ED Notes (Signed)
Attempting to triage, pt not answering questions.  Eyelids fluttering. Pt nail beds cyanotic, HR elevated.  Pt taken to bed 21 via wheelchair.
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
## 2021-02-15 NOTE — ED Triage Notes (Signed)
Pt c/o weakness, dizziness and chest pain. Started approximately 20 min ago.

## 2021-02-15 NOTE — ED Notes (Signed)
Pt had another episode as one stated below. MD is still at bedside. MD to put Ativan orders in and consult neuro.

## 2021-02-15 NOTE — ED Provider Notes (Signed)
[facility] EMERGENCY DEPARTMENT Provider Note  CSN: [id number] Arrival date & time: 02/15/21  2044  History Chief Complaint  Patient presents with  . Weakness   Debbie [name] is a 40 y.o. female.  Patient is a 40 year old female with a history of anxiety, migraines, and depression who presents with dizziness.  Patient states that this happened when she was sitting on her couch watching TV after dinner this evening she states that she turned her head just slightly to one side when she felt the sudden onset of dizziness.  When asked to describe the dizziness patient states that the room is just fluid.  Patient reports that she has felt this way one other time before when she was pregnant.  Patient states that she has taken multiple pregnancy tests and she is not pregnant.  She denies any chest pain or abdominal pain.  She denies any shortness of breath.  She has not recently felt sick.  She had 2 slices of pizza for dinner.  She denies any alcohol or drug use this evening.  Patient states that her symptoms started out of the blue and earlier today was a normal day for her.  Patient does not have any weakness in her lower extremities or upper extremities.  Overall, patient just feels out of it.  She feels very tired.    Past Medical History:  Diagnosis Date  . Anxiety    self reported  . Depression    self reported  . Migraine   . No pertinent past medical history     Patient Active Problem List   Diagnosis Date Noted  . Degenerative disc disease, lumbar 01/29/2021  . Myofascial pain dysfunction syndrome 12/25/2020  . Nonallopathic lesion of cervical region 12/25/2020  . 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 TBI (traumatic brain injury) ([place]) 01/18/2020  . 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 Insomnia 12/10/2017  . Chronic migraine without aura without status migrainosus, not intractable  03/05/2017    Past Surgical History:  Procedure Laterality Date  . CESAREAN SECTION  2013  . laparscopy    . SHOULDER CAPSULORRHAPHY       OB History    Gravida  2   Para      Term      Preterm      AB  1   Living        SAB  1   IAB      Ectopic      Multiple      Live Births              Family History  Problem Relation Age of Onset  . Heart disease Maternal Grandfather   . Cerebral aneurysm Sister   . Migraines Sister     Social History   Tobacco Use  . Smoking status: Never Smoker  . Smokeless tobacco: Never Used  Vaping Use  . Vaping Use: Never used  Substance Use Topics  . Alcohol use: No  . Drug use: No    Home Medications Prior to Admission medications   Medication Sig Start Date End Date Taking? Authorizing Provider  Botulinum Toxin Type A (BOTOX) 200 units SOLR Inject 155 Units as directed every 3 (three) months. Inject 155units to head and neck intramuscularly every 3 months. 06/11/20   Lomax, Amy, NP  cyclobenzaprine (FLEXERIL) 5 MG tablet TAKE 1 TABLET BY MOUTH THREE TIMES A DAY AS NEEDED FOR MUSCLE  SPASMS 08/24/20   Lomax, Amy, NP  DULoxetine (CYMBALTA) 30 MG capsule TAKE 1 CAPSULE BY MOUTH EVERY DAY 10/25/20   Nevada Crane, MD  gabapentin (NEURONTIN) 100 MG capsule Take 2 capsules (200 mg total) by mouth at bedtime. 12/25/20   Lyndal Pulley, DO  ibuprofen (ADVIL,MOTRIN) 600 MG tablet Take 1 tablet (600 mg total) by mouth every 6 (six) hours as needed for moderate pain. 11/26/16   Melony Overly, MD  prochlorperazine (COMPAZINE) 10 MG tablet Take 1 tablet (10 mg total) by mouth every 6 (six) hours as needed for nausea or vomiting. 12/01/19   Lomax, Amy, NP  Rimegepant Sulfate (NURTEC) 75 MG TBDP Take 75 mg by mouth daily as needed (take for abortive therapy of migraine, no more than 1 tablet in 24 hours or 10 per month). 04/30/20   Lomax, Amy, NP  SUMAtriptan (IMITREX) 6 MG/0.5ML SOLN injection Inject 0.5 mLs (6 mg total) into the skin every 2  (two) hours as needed for migraine or headache. No more then 2 injections in 24hrs 04/28/19   Lomax, Amy, NP    Allergies    Tylenol [acetaminophen], Aspirin, Benadryl [diphenhydramine hcl], Dilaudid [hydromorphone hcl], Penicillins, and Prednisone  Review of Systems   Review of Systems  Constitutional: Negative for chills and fever.  HENT: Negative for ear pain and sore throat.   Eyes: Negative for pain and visual disturbance.  Respiratory: Negative for cough and shortness of breath.   Cardiovascular: Negative for chest pain and palpitations.  Gastrointestinal: Negative for abdominal pain and vomiting.  Genitourinary: Negative for dysuria and hematuria.  Musculoskeletal: Negative for arthralgias and back pain.  Skin: Negative for color change and rash.  Neurological: Positive for dizziness. Negative for seizures and syncope.  All other systems reviewed and are negative.   Physical Exam Updated Vital Signs BP (!) 158/105 (BP Location: Left Arm)   Pulse (!) 114   Temp 97.8 F (36.6 C) (Oral)   Resp (!) 26   SpO2 100%   Physical Exam Vitals and nursing note reviewed.  Constitutional:      General: She is not in acute distress.    Appearance: She is well-developed.  HENT:     Head: Normocephalic and atraumatic.     Nose: Nose normal.     Mouth/Throat:     Mouth: Mucous membranes are moist.     Pharynx: Oropharynx is clear.  Eyes:     Extraocular Movements: Extraocular movements intact.     Pupils: Pupils are equal, round, and reactive to light.  Cardiovascular:     Rate and Rhythm: Regular rhythm. Tachycardia present.  Pulmonary:     Effort: Pulmonary effort is normal.     Breath sounds: Normal breath sounds.  Abdominal:     Palpations: Abdomen is soft.     Tenderness: There is no abdominal tenderness.  Musculoskeletal:     Cervical back: Normal range of motion and neck supple.     Right lower leg: No edema.     Left lower leg: No edema.  Skin:    General: Skin is  warm and dry.     Capillary Refill: Capillary refill takes less than 2 seconds.  Neurological:     General: No focal deficit present.     Mental Status: She is alert and oriented to person, place, and time.     Cranial Nerves: No cranial nerve deficit.     Sensory: No sensory deficit.     Motor: No weakness.  Coordination: Coordination normal.     Comments: 5 out of 5 strength to her bilateral lower and upper extremities.    ED Results / Procedures / Treatments   Labs (all labs ordered are listed, but only abnormal results are displayed) Labs Reviewed  LIPASE, BLOOD  COMPREHENSIVE METABOLIC PANEL  CBC  URINALYSIS, ROUTINE W REFLEX MICROSCOPIC  I-STAT BETA HCG BLOOD, ED ([facility], [facility], [facility] ONLY)   EKG None  Radiology No results found.  Procedures Procedures   Medications Ordered in ED Medications - No data to display  ED Course  I have reviewed the triage vital signs and the nursing notes.  Pertinent labs & imaging results that were available during my care of the patient were reviewed by me and considered in my medical decision making (see chart for details).    MDM Rules/Calculators/A&P                          Patient is a 40 year old female with a history of migraines, anxiety, depression who presents with dizziness and weakness.  On my evaluation she is alert awake and oriented.  She is interactive and is able to tell me all the events of tonight.  She is ill-appearing on exam.  Tired fatigued.  Blood pressure 130/90.  Temperature 99.1 F.  She is tachycardic with a heart rate in the 110s.  Saturating 95% on room air.  Neurologic exam is intact.  Lipase, CMP, CBC, urinalysis, beta hCG, chest x-ray ordered to evaluate causes of dizziness and weakness.  While is not in the room, patient had an episode of unresponsiveness with the nurse.  Attending physician was available for evaluation at that time.  He reported going into the room where patient was intermittently  responding and then staring off into space not talking and then joining back in the conversation 2 minutes later.  Overall events and presentation concerning for possible seizure.  After these events, patient states that she can no longer move her legs.  She reports abnormal sensation over the left side of her face.  In addition to cause of dizziness, now concern for seizure-like activity, dissection, or ischemic event.  Neurology consulted for evaluation for seizure.  Further recommended CTA of the head and neck and MRI to evaluate for causes of mental status change.  Currently awaiting results of CTA head and neck and results of MRI to plan disposition. If patient does not return to baseline, patient will need to be admitted for observation and possible EEG. Patient is being handed off to the overnight team. Please see additional notes for final disposition and results.    Final Clinical Impression(s) / ED Diagnoses Final diagnoses:  None    Rx / DC Orders ED Discharge Orders    None       Quashawn Jewkes, [place], MD 02/16/21 [place], [place], MD 02/16/21 [id number]

## 2021-02-15 NOTE — ED Notes (Signed)
While this RN was talking to pt, pt stated she felt like she was going to pass out. Pt laid her head back, shut her eyes, and was unresponsive to stimuli as this RN was trying to awake pt as was pt husband. MD called to bedside. Pt vitals stayed stable on monitor. 2158 pt is awake now and talking to this RN and MD.

## 2021-02-16 ENCOUNTER — Emergency Department (HOSPITAL_COMMUNITY): Payer: BC Managed Care – PPO

## 2021-02-16 IMAGING — MR MR HEAD W/O CM
9 of 10 series · 35 of 48 positions shown · non-contrast
Comparison: Prior CTA from earlier the same day.

CLINICAL DATA: Initial evaluation for acute dizziness.

EXAM:
MRI HEAD WITHOUT CONTRAST
TECHNIQUE: Multiplanar, multiecho pulse sequences of the brain and surrounding
structures were obtained without intravenous contrast.

[Series 4: T1 · sagittal · 5.0mm · 0.47mm/px · 2 of 25 slices shown (1 of 2)]
[im 1/25]
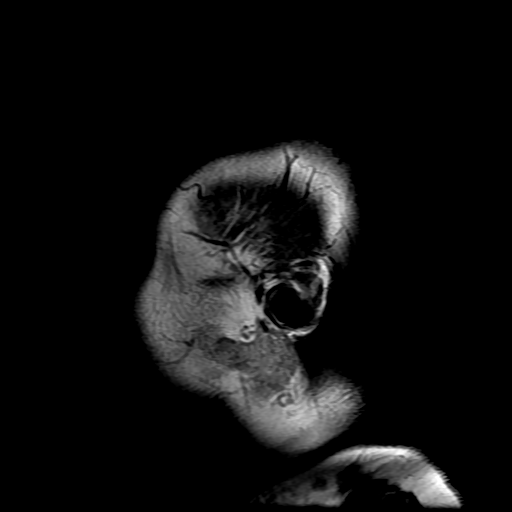
[im 25/25]
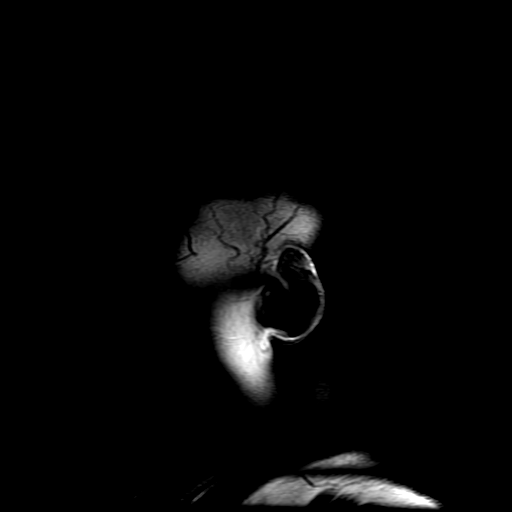

[Series 5: DWI · axial · 3.0mm · 1.09mm/px · z∈[-100,+49]mm · 8 of 102 slices shown (1 of 4)]
[im 1/102]
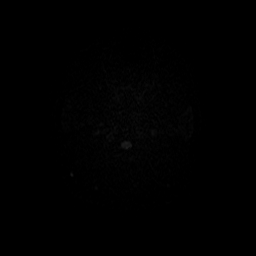
[im 12/102]
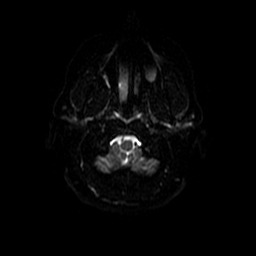
[im 34/102]
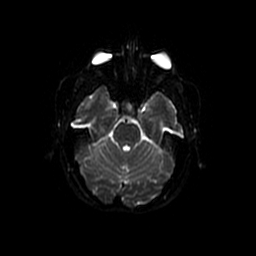
[im 45/102]
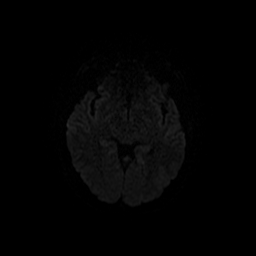
[im 57/102]
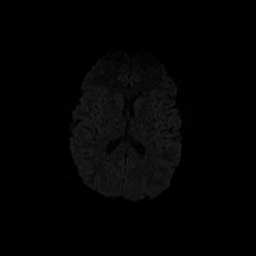
[im 68/102]
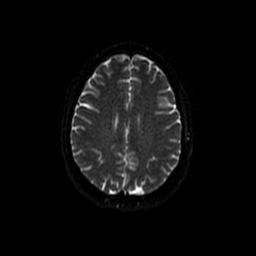
[im 90/102]
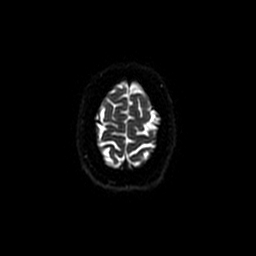
[im 102/102]
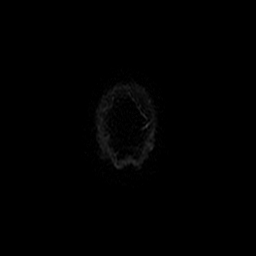

[Series 6: T2 · axial · 5.0mm · 0.43mm/px · z∈[-101,+47]mm · 3 of 26 slices shown (1 of 2)]
[im 1/26]
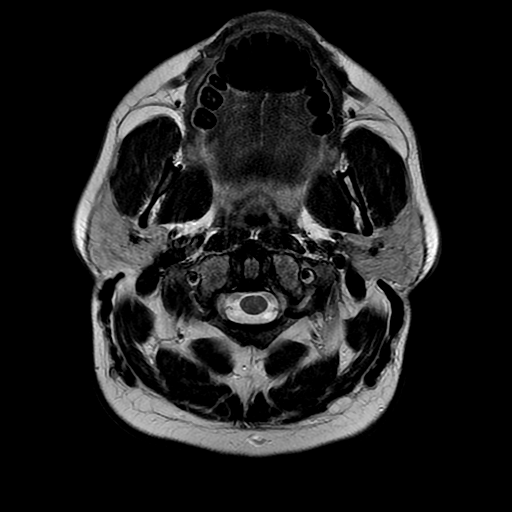
[im 13/26]
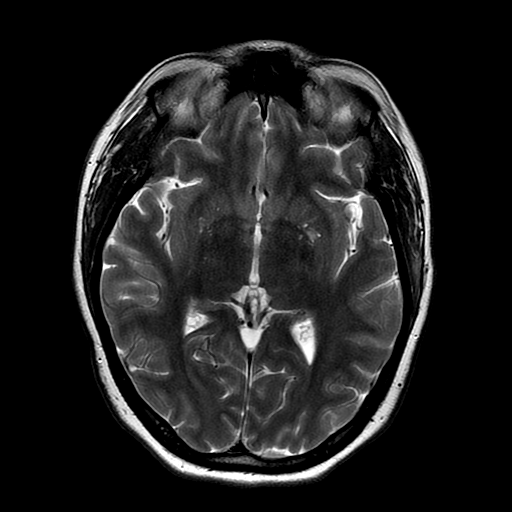
[im 26/26]
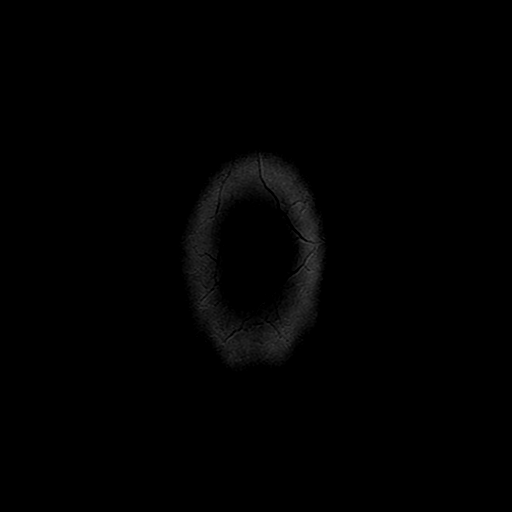

[Series 7: FLAIR · axial · 3.0mm · 0.43mm/px · z∈[-101,+47]mm · 3 of 26 slices shown]
[im 1/26]
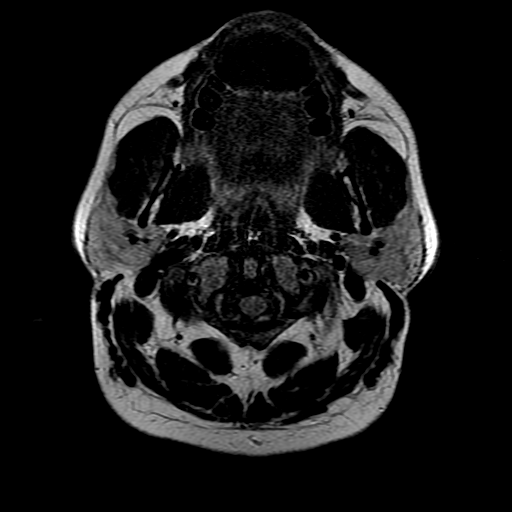
[im 13/26]
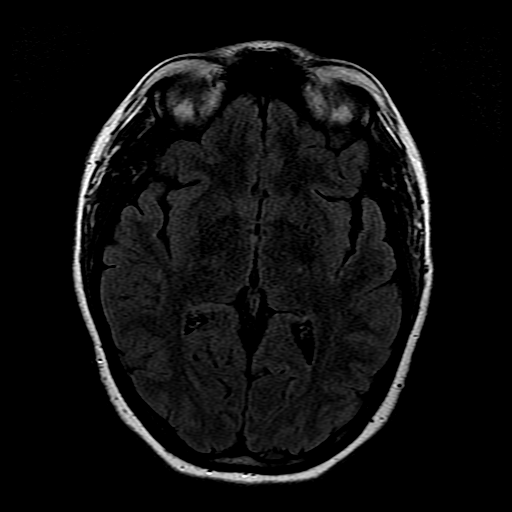
[im 26/26]
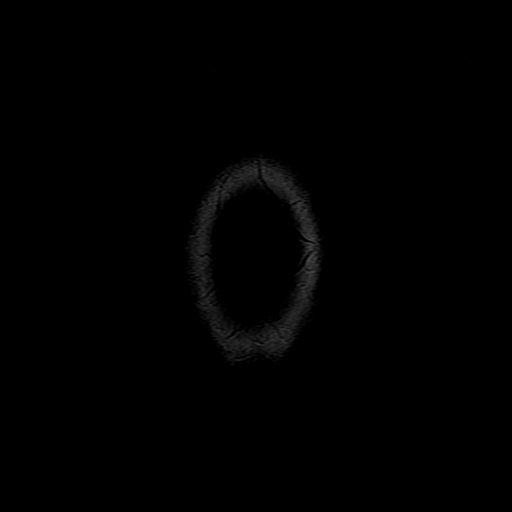

[Series 8: DWI · coronal · 5.0mm · 1.09mm/px · 7 of 69 slices shown (2 of 4)]
[im 1/69]
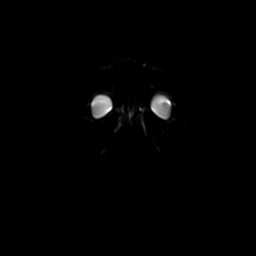
[im 12/69]
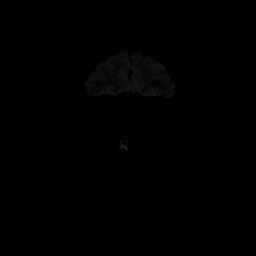
[im 23/69]
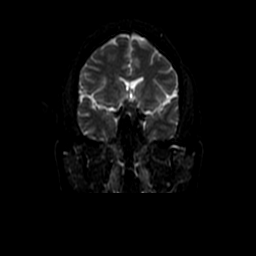
[im 35/69]
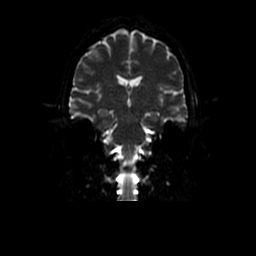
[im 46/69]
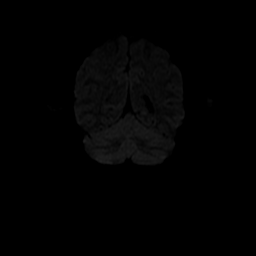
[im 57/69]
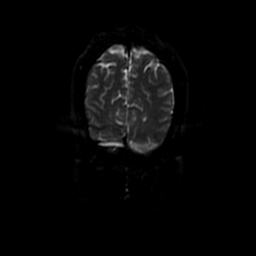
[im 69/69]
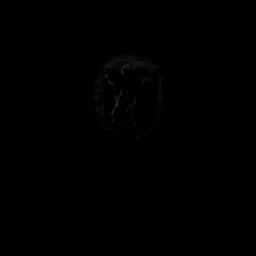

[Series 10: T1 · axial · 3.0mm · 0.47mm/px · 1 of 100 slices shown (2 of 2)]
[im 1/100]
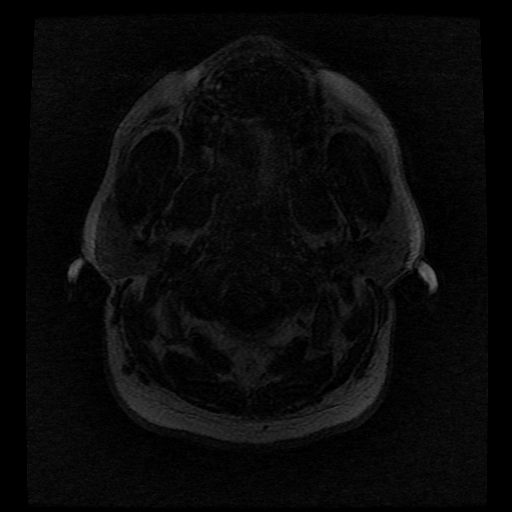

[Series 11: T2 · coronal · 5.0mm · 0.39mm/px · 3 of 29 slices shown (2 of 2)]
[im 1/29]
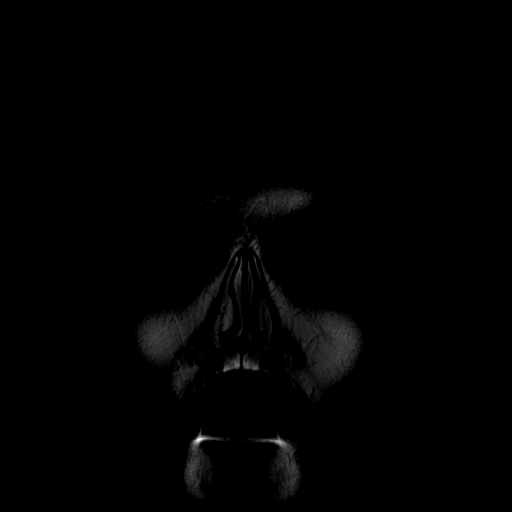
[im 15/29]
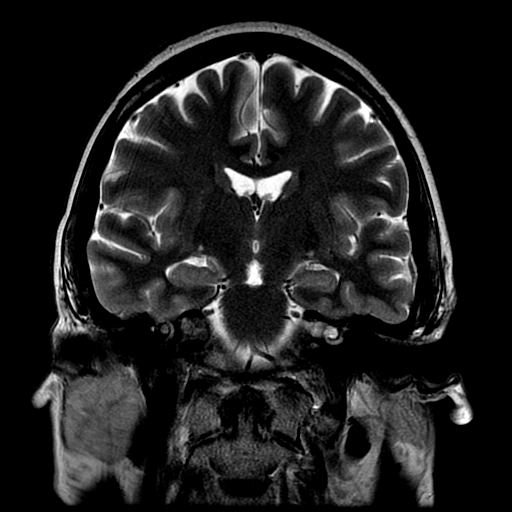
[im 29/29]
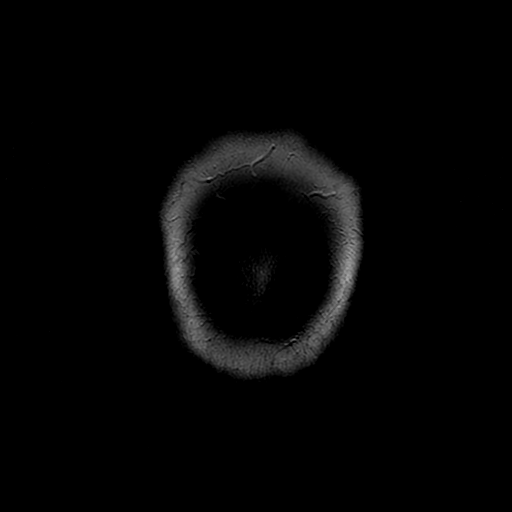

[Series 500: DWI · axial · 3.0mm · 1.09mm/px · z∈[-100,+49]mm · 5 of 51 slices shown (3 of 4)]
[im 1/51]
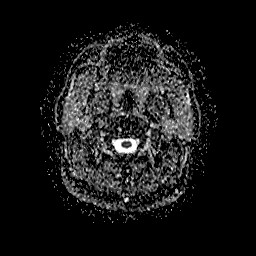
[im 13/51]
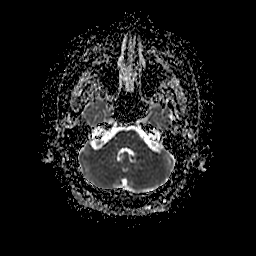
[im 26/51]
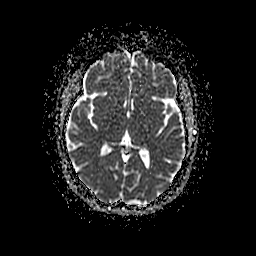
[im 38/51]
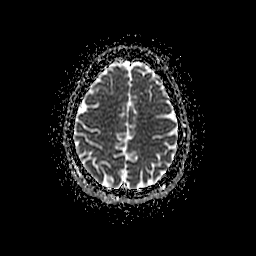
[im 51/51]
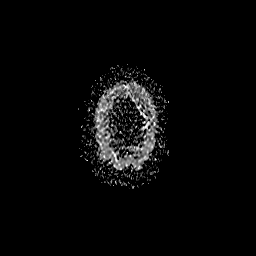

[Series 800: DWI · coronal · 5.0mm · 1.09mm/px · 3 of 35 slices shown (4 of 4)]
[im 1/35]
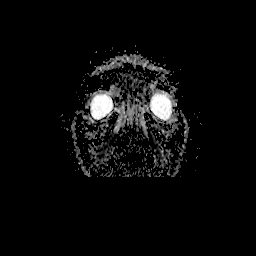
[im 18/35]
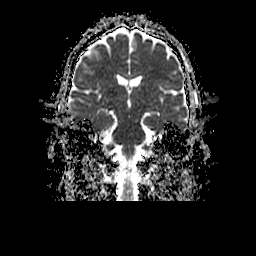
[im 35/35]
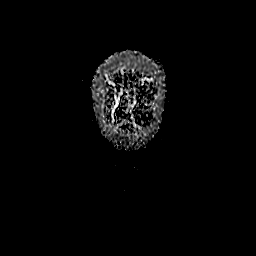

[35 of 48 positions shown; findings below may reference images not displayed]

FINDINGS: Brain: Cerebral volume within normal limits for patient age. No
focal parenchymal signal abnormality identified.

No abnormal foci of restricted diffusion to suggest acute or
subacute ischemia. Gray-white matter differentiation well
maintained. No encephalomalacia to suggest chronic infarction. No
foci of susceptibility artifact to suggest acute or chronic
intracranial hemorrhage.

No mass lesion, midline shift or mass effect. No hydrocephalus. No
extra-axial fluid collection. Major dural sinuses are grossly
patent.

Pituitary gland and suprasellar region are normal. Midline
structures intact and normal.

Vascular: Major intracranial vascular flow voids well maintained and
normal in appearance.

Skull and upper cervical spine: Craniocervical junction normal.
Visualized upper cervical spine within normal limits. Bone marrow
signal intensity normal. No scalp soft tissue abnormality.

Sinuses/Orbits: Globes and orbital soft tissues within normal
limits.

Left maxillary sinus retention cyst noted. Paranasal sinuses are
otherwise clear. No mastoid effusion. Inner ear structures grossly
within normal limits.

Other: None.
IMPRESSION: Normal brain MRI.  No acute intracranial abnormality.

## 2021-02-16 MED ORDER — IOHEXOL 350 MG/ML SOLN
75.0000 mL | Freq: Once | INTRAVENOUS | Status: AC | PRN
  Administered 2021-02-16: 75 mL via INTRAVENOUS

## 2021-02-16 MED ORDER — IBUPROFEN 400 MG PO TABS
600.0000 mg | ORAL_TABLET | Freq: Once | ORAL | Status: AC
  Administered 2021-02-16: 600 mg via ORAL
  Filled 2021-02-16: qty 1

## 2021-02-16 MED ORDER — LACTATED RINGERS IV BOLUS
1000.0000 mL | Freq: Once | INTRAVENOUS | Status: AC
  Administered 2021-02-16: 1000 mL via INTRAVENOUS

## 2021-02-16 NOTE — ED Notes (Signed)
Pt discharged and ambulated out of the ED without difficulty. 

## 2021-02-16 NOTE — Discharge Instructions (Addendum)
Your tests tonight are all normal and you can be discharged home. It is recommended that you follow up with your neurologist for any further outpatient evaluation needed.   Return to the emergency department with any new or concerning symptoms.

## 2021-02-16 NOTE — ED Notes (Signed)
Pt going to MRI

## 2021-02-16 NOTE — ED Notes (Signed)
Pt is in CT

## 2021-02-16 NOTE — ED Provider Notes (Signed)
Sudden onset dizziness and weakness Mentally clear Dizziness is positional  Neuro exam normal During eval, became unresponsive Multiple episodes of 2 min episodes, followed by left facial numbness, bilateral leg weakness   Has been seen by neuro Rory Percy) CTA head and neck, MRI If studies normal, and at baseline mental status, can d/ch home If ANY abnormals, consider obs admisison  Has received Ativan  Tachycardic - getting fluids Consider adding thyroid labs for outpatient follow up  2:00 - recheck  HR 106 - improving with fluids. She reports lower abdominal cramping. Chronic/recurrent, associated with c-section scar. Ibuprofen ordered. She is being transported to MRI now.   4:00 - CTA head/neck and MRI normal. Heart rate has normalized with fluids. She is felt appropriate for discharge home. She plans follow up with neurology.    Charlann Lange, PA-C 02/16/21 0405    Varney Biles, MD 02/16/21 1538

## 2021-02-16 NOTE — ED Notes (Signed)
Provider at bedside

## 2021-02-16 NOTE — ED Notes (Signed)
Pt able to have small sips of water per MD

## 2021-02-19 ENCOUNTER — Telehealth: Payer: Self-pay | Admitting: *Deleted

## 2021-02-19 ENCOUNTER — Ambulatory Visit: Payer: BC Managed Care – PPO | Admitting: Family Medicine

## 2021-02-19 NOTE — Telephone Encounter (Signed)
I spoke with the patient and I let her know that Dr. Jaynee Eagles had reviewed the ER note and did not have any overt concerns and that we can get her scheduled and the soonest available is June 14. I also offered to put pt on wait list. The patient was scheduled for 8:00 AM but she also stated that she was doing better Saturday but is having trouble again today.  She says she has been dizzy, having problems with headache and pressure.  Her head feels like it will explode. Her eyes are fuzzy. She cannot sit up for long periods of time. She states her husband has told her that a couple of times she stopped talking midsentence and was not responding. She told me she may go back to the ER again and I did recommend that she go back for more acute evaluation and treatment. She verbalized understanding and appreciation for the call.

## 2021-02-19 NOTE — Telephone Encounter (Signed)
Hopefully they admit her and perform an EEG. Put her on the wait list unfortunately the only place to get immediate healthcare is te emergency room and inpatient; even if we see her in the office there is still a wait time for scheduling testing especially EEGs.

## 2021-02-19 NOTE — Telephone Encounter (Signed)
Called pt.  She has appt today with AL/NP for new problem possible sz?  Seen in ED.  Pt states that she had non responsive episodes, ED states did have sz.  She is better, no seizure since Friday.  Was given.  Still having some nausea/headache but is better.  I relayed since new problem needs to see MD vs NP.  Pt ok with this  Dr Jaynee Eagles has appt available 02-21-21 at 1400 (OV slot).  Will check with Bethany RN after 1000 today then call her back.  Pt had botox 02-14-21.

## 2021-02-19 NOTE — Telephone Encounter (Signed)
She can see me first available, does not have to be this week, neurology thought she was having migraines also possibly improved with fluids, she was evaluated by neurology already thanks

## 2021-02-24 ENCOUNTER — Emergency Department (HOSPITAL_COMMUNITY)
Admission: EM | Admit: 2021-02-24 | Discharge: 2021-02-25 | Disposition: A | Discharge status: Home / Self Care | Payer: BC Managed Care – PPO | Attending: Emergency Medicine | Admitting: Emergency Medicine

## 2021-02-24 DIAGNOSIS — R569 Unspecified convulsions: Secondary | ICD-10-CM | POA: Insufficient documentation

## 2021-02-24 DIAGNOSIS — R110 Nausea: Secondary | ICD-10-CM | POA: Diagnosis not present

## 2021-02-24 DIAGNOSIS — R42 Dizziness and giddiness: Secondary | ICD-10-CM | POA: Diagnosis not present

## 2021-02-24 DIAGNOSIS — R0789 Other chest pain: Secondary | ICD-10-CM | POA: Diagnosis not present

## 2021-02-24 DIAGNOSIS — R519 Headache, unspecified: Secondary | ICD-10-CM

## 2021-02-24 IMAGING — DX DG CHEST 2V
2 series · 2 of 2 positions shown · non-contrast
Comparison: [DATE]

CLINICAL DATA: Chest pain

EXAM:
CHEST - 2 VIEW

[chest lat]
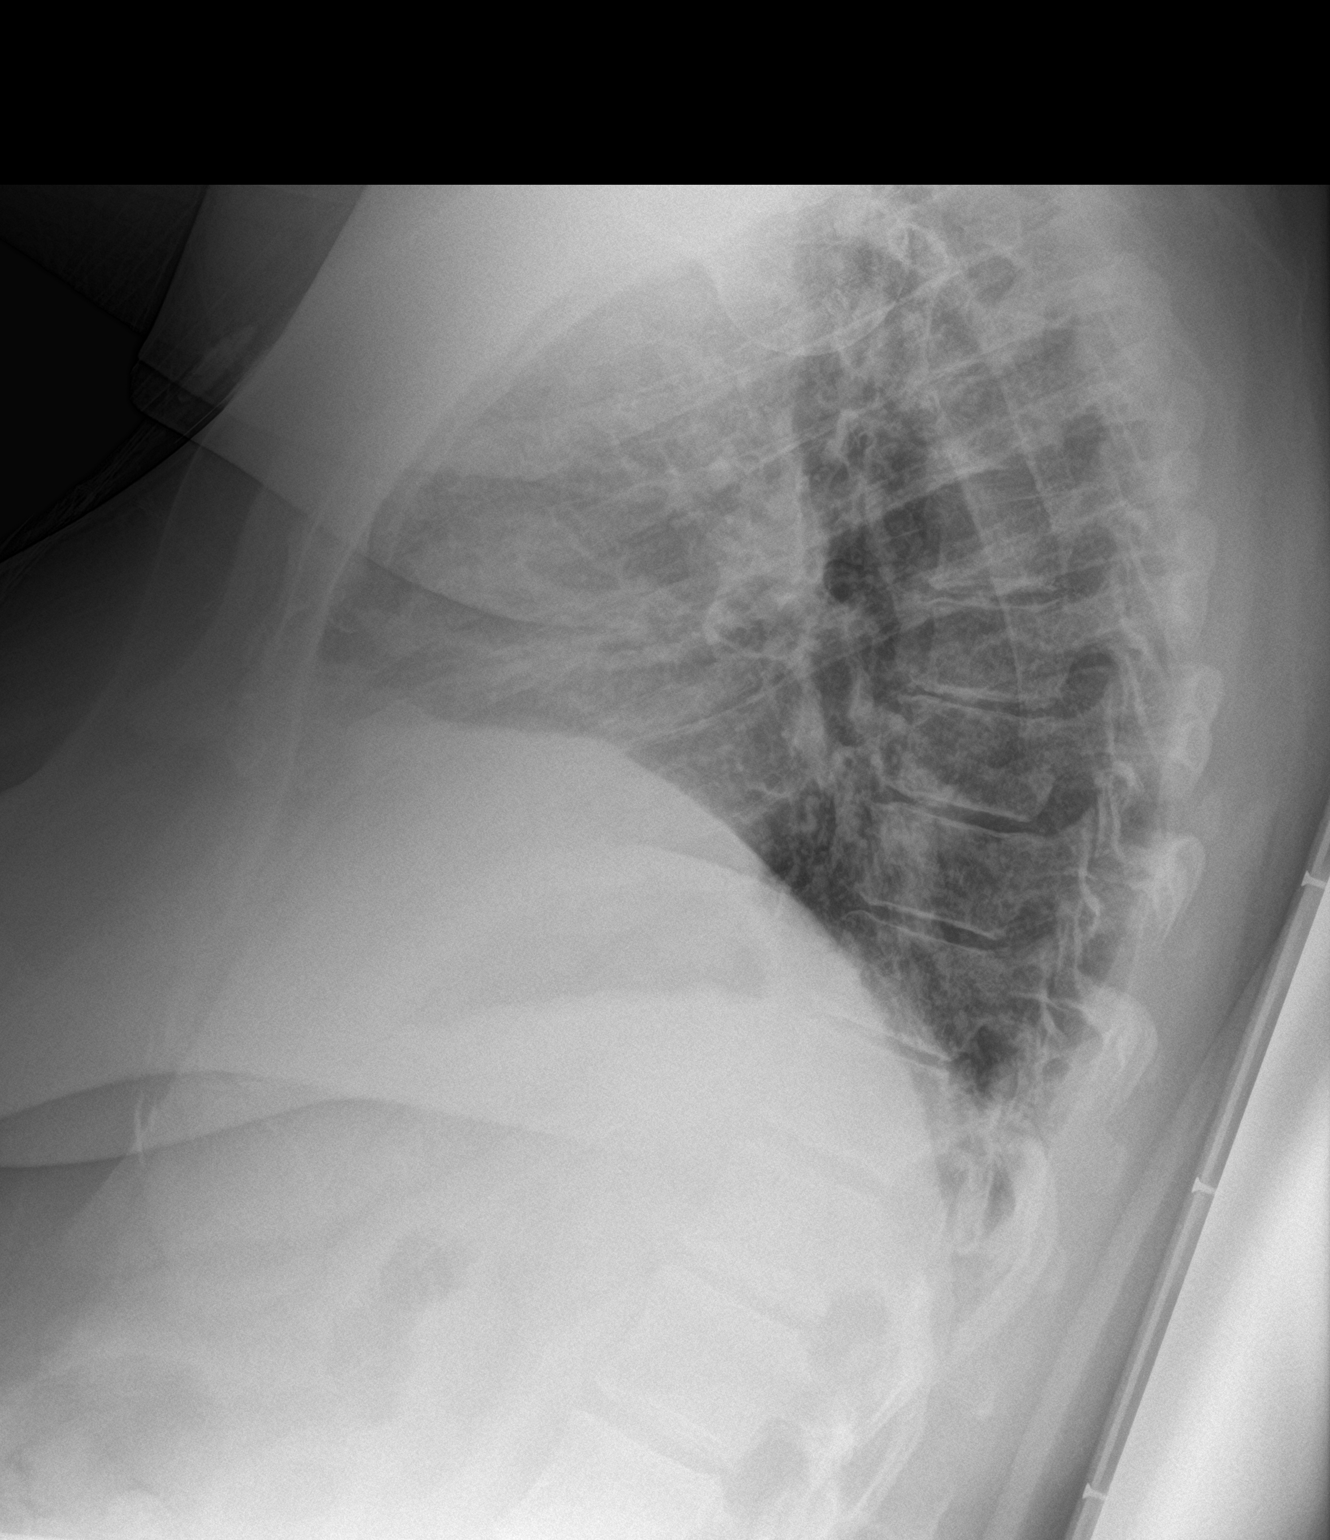

[chest ap]
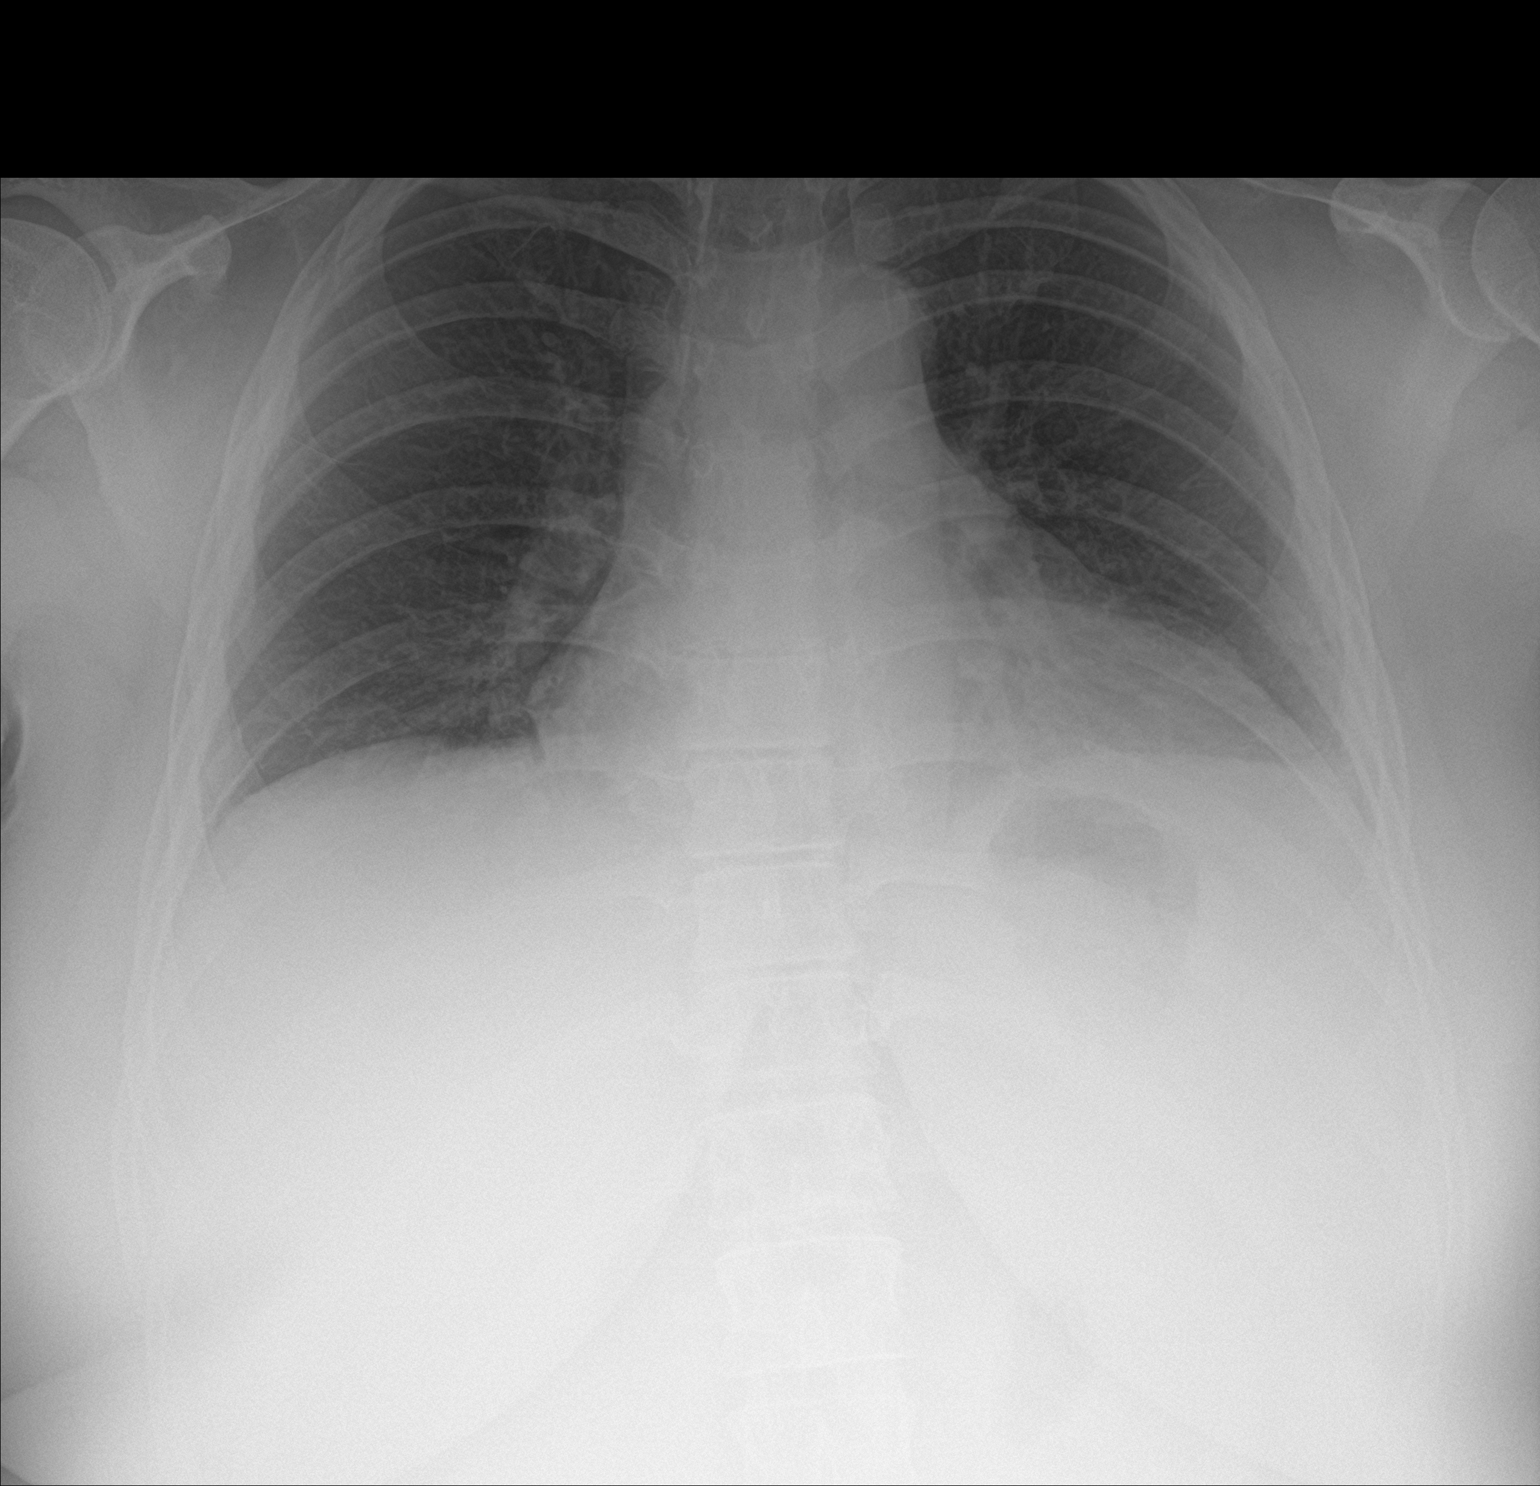

[2 of 2 positions shown; findings below may reference images not displayed]

FINDINGS: Heart and mediastinal contours are within normal limits. No focal
opacities or effusions. No acute bony abnormality.
IMPRESSION: No active cardiopulmonary disease.

## 2021-02-24 NOTE — ED Triage Notes (Signed)
Pt came in c/o of seizures. She had one today 2230. She is still having headaches, and feeling her heart race. She is also complaining of Chest burning and squeezing. Pt complains of her ears ringing. She states she feels weak and has not felt right these past couple days.

## 2021-02-25 ENCOUNTER — Emergency Department (HOSPITAL_COMMUNITY): Payer: BC Managed Care – PPO

## 2021-02-25 LAB — CBC
HCT: 43.7 % (ref 36.0–46.0)
Hemoglobin: 14.7 g/dL (ref 12.0–15.0)
MCH: 29.1 pg (ref 26.0–34.0)
MCHC: 33.6 g/dL (ref 30.0–36.0)
MCV: 86.4 fL (ref 80.0–100.0)
Platelets: 334 10*3/uL (ref 150–400)
RBC: 5.06 MIL/uL (ref 3.87–5.11)
RDW: 12.3 % (ref 11.5–15.5)
WBC: 12.1 10*3/uL — ABNORMAL HIGH (ref 4.0–10.5)
nRBC: 0 % (ref 0.0–0.2)

## 2021-02-25 LAB — BASIC METABOLIC PANEL
Anion gap: 9 (ref 5–15)
BUN: 12 mg/dL (ref 6–20)
CO2: 23 mmol/L (ref 22–32)
Calcium: 8.9 mg/dL (ref 8.9–10.3)
Chloride: 104 mmol/L (ref 98–111)
Creatinine, Ser: 0.89 mg/dL (ref 0.44–1.00)
GFR, Estimated: >60 mL/min (ref >60)
Glucose, Bld: 114 mg/dL — ABNORMAL HIGH (ref 70–99)
Potassium: 3.7 mmol/L (ref 3.5–5.1)
Sodium: 136 mmol/L (ref 135–145)

## 2021-02-25 LAB — I-STAT BETA HCG BLOOD, ED (MC, WL, AP ONLY): I-stat hCG, quantitative: <5 m[IU]/mL (ref <5)

## 2021-02-25 LAB — CBG MONITORING, ED: Glucose-Capillary: 130 mg/dL — ABNORMAL HIGH (ref 70–99)

## 2021-02-25 LAB — TROPONIN I (HIGH SENSITIVITY): Troponin I (High Sensitivity): 2 ng/L (ref <18)

## 2021-02-25 IMAGING — CT CT HEAD W/O CM
4 series · 17 of 47 positions shown, 19 images · non-contrast
Comparison: [DATE]

CLINICAL DATA: Headache and seizure.

EXAM:
CT HEAD WITHOUT CONTRAST
TECHNIQUE: Contiguous axial images were obtained from the base of the skull
through the vertex without intravenous contrast.

[Series 3: head wo · axial · 0.43mm/px · z∈[-188,-68]mm · 7 of 32 slices shown, 9 images]
[im 4/32  brain]
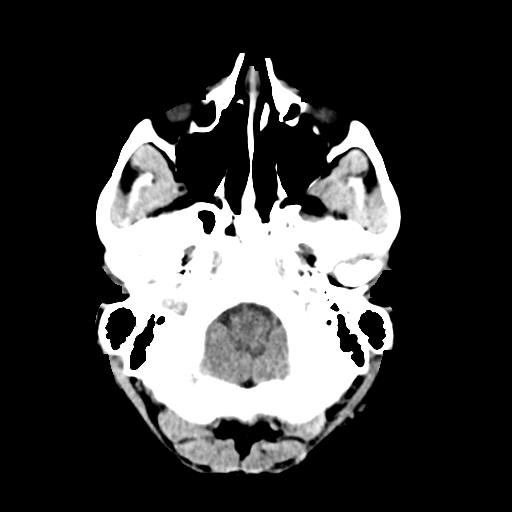
[im 4/32  bone]
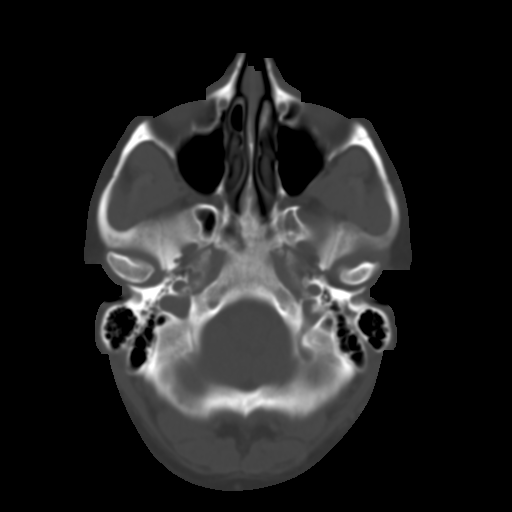
[im 8/32  brain]
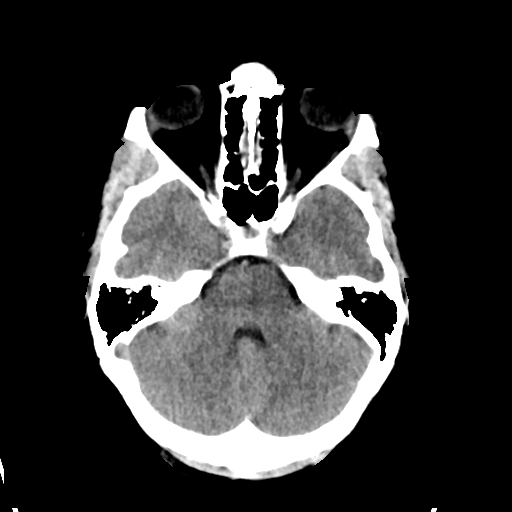
[im 12/32  brain]
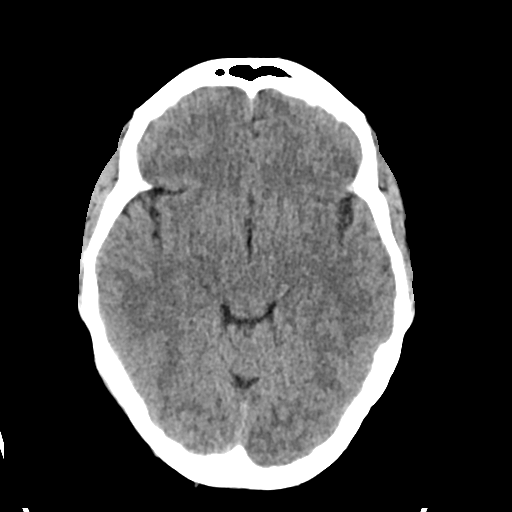
[im 16/32  brain]
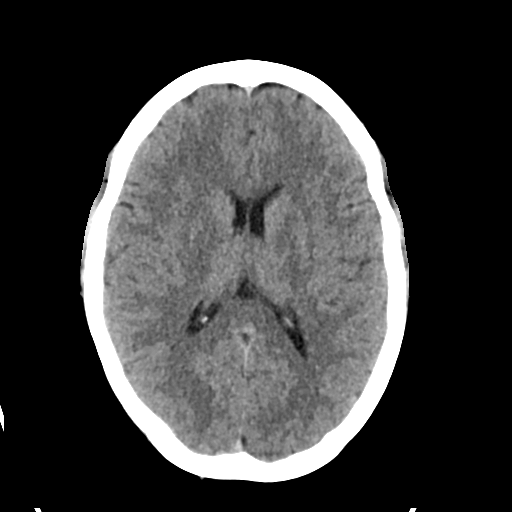
[im 20/32  brain]
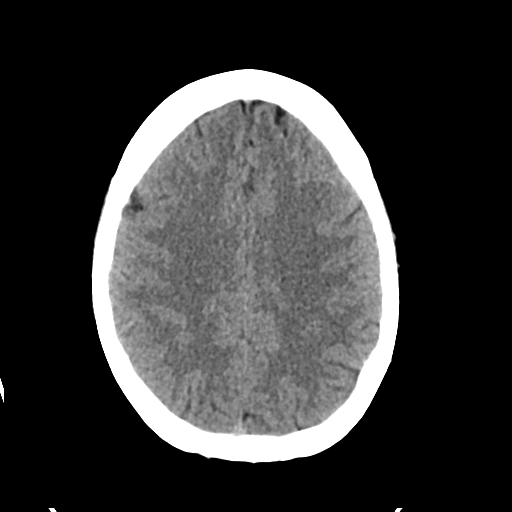
[im 20/32  bone]
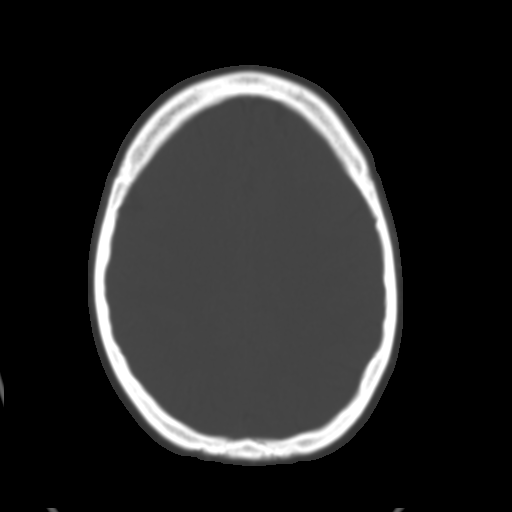
[im 24/32  brain]
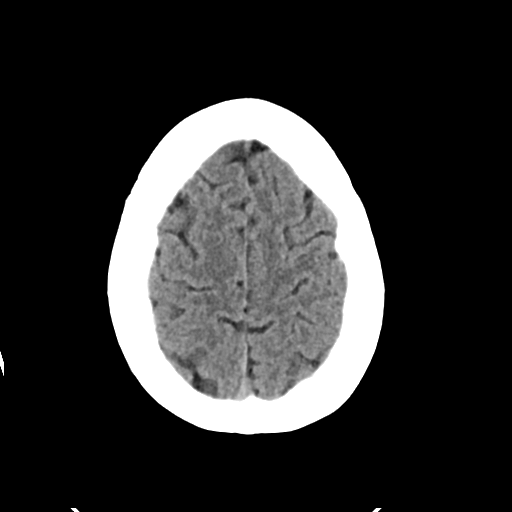
[im 28/32  brain]
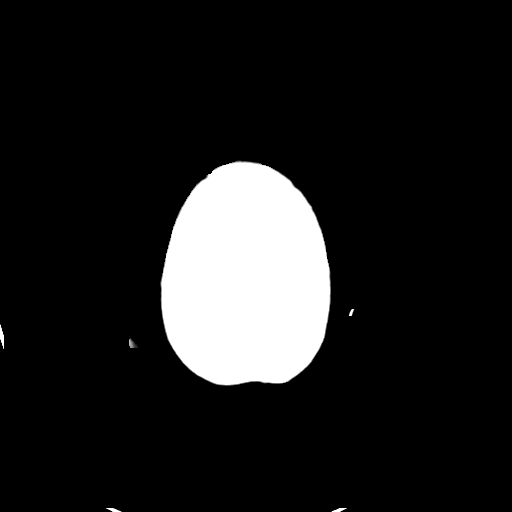

[Series 4: head bone · axial · 0.43mm/px · z∈[-190,-134]mm · 4 of 80 slices shown]
[im 8/80  bone]
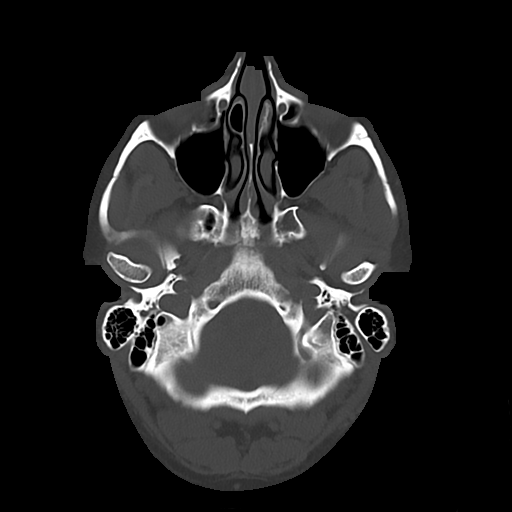
[im 16/80  bone]
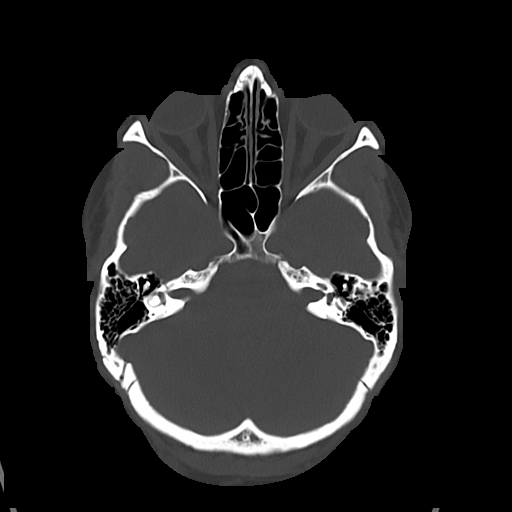
[im 24/80  bone]
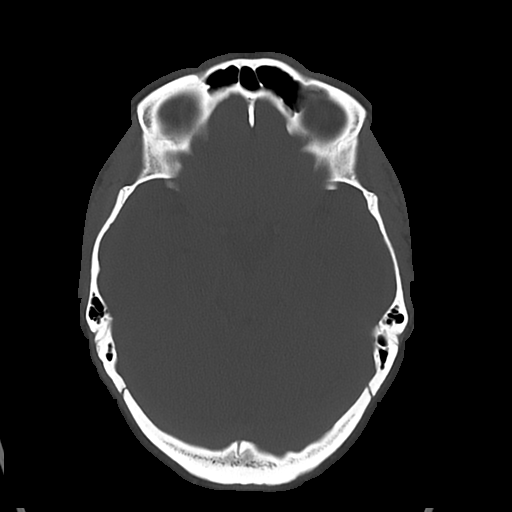
[im 36/80  bone]
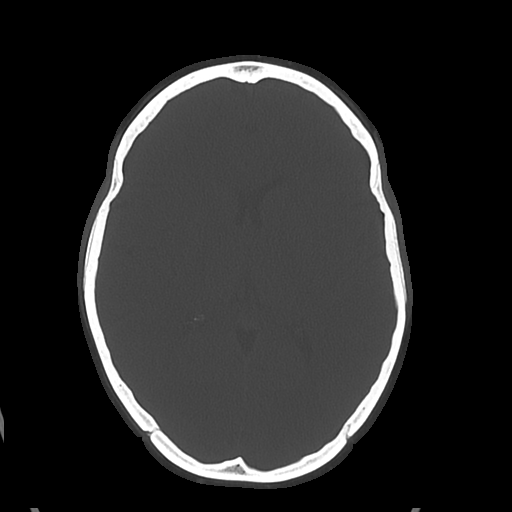

[Series 5: cor soft · coronal · 0.31mm/px · 3 of 66 slices shown]
[im 22/66  brain]
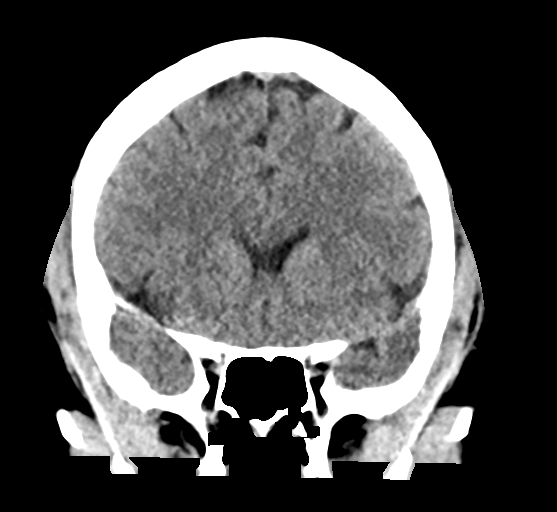
[im 29/66  brain]
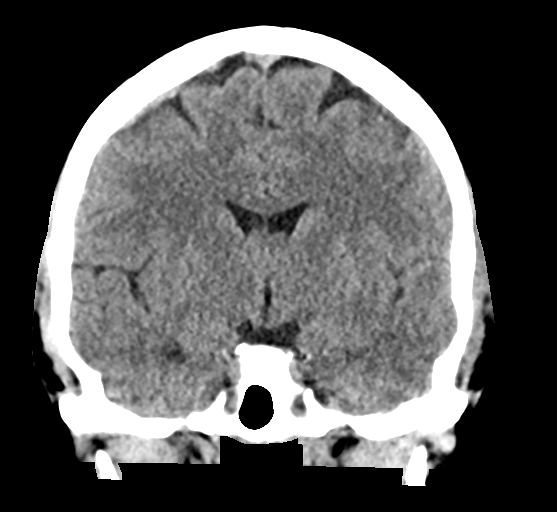
[im 37/66  brain]
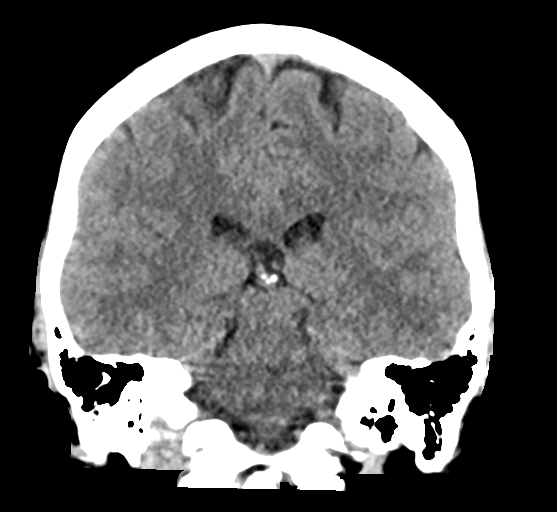

[Series 6: sag soft · sagittal · 0.31mm/px · 3 of 54 slices shown]
[im 18/54  brain]
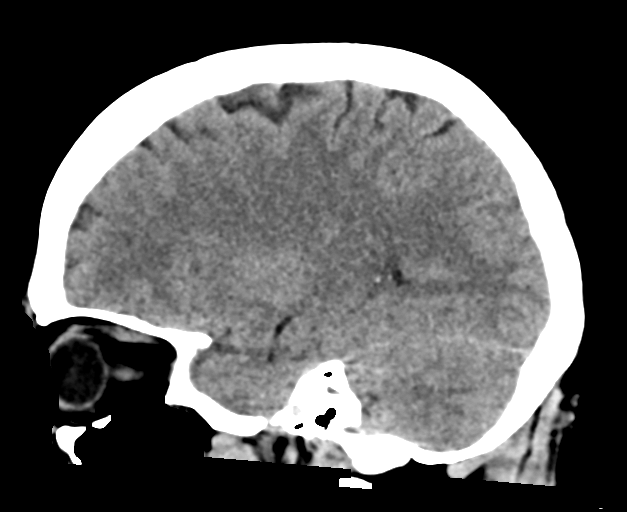
[im 27/54  brain]
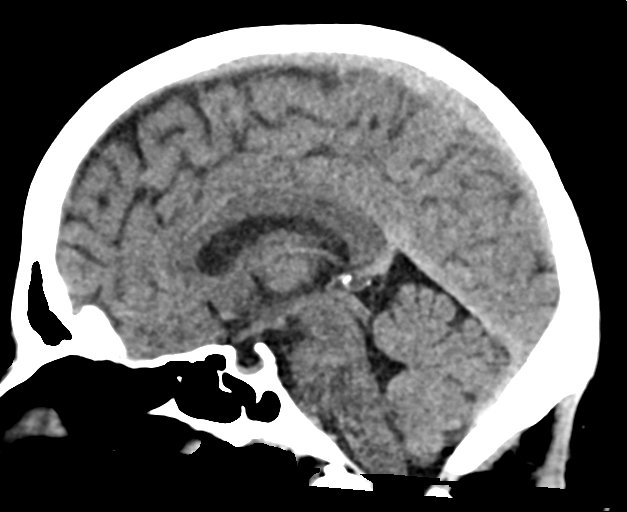
[im 36/54  brain]
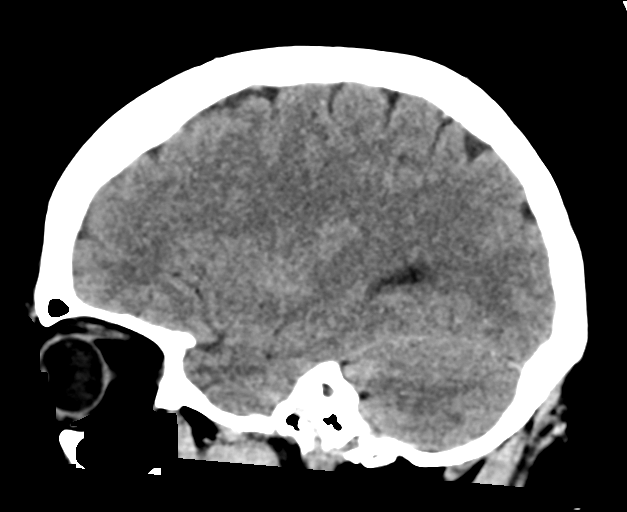

[17 of 47 positions shown; findings below may reference images not displayed]

FINDINGS: Brain: No evidence of acute infarction, hemorrhage, hydrocephalus,
extra-axial collection or mass lesion/mass effect.

Vascular: No hyperdense vessel or unexpected calcification.

Skull: Normal. Negative for fracture or focal lesion.

Sinuses/Orbits: Mild posterior left maxillary sinus mucosal
thickening is seen.

Other: None.
IMPRESSION: 1. No acute intracranial process.

## 2021-02-25 MED ORDER — ONDANSETRON 4 MG PO TBDP: 4.0000 mg | ORAL_TABLET | Freq: Three times a day (TID) | ORAL | 0 refills | Status: DC | PRN

## 2021-02-25 MED ORDER — MECLIZINE HCL 25 MG PO TABS
25.0000 mg | ORAL_TABLET | Freq: Once | ORAL | Status: AC
  Administered 2021-02-25: 25 mg via ORAL
  Filled 2021-02-25: qty 1

## 2021-02-25 MED ORDER — IBUPROFEN 800 MG PO TABS
800.0000 mg | ORAL_TABLET | Freq: Once | ORAL | Status: AC
  Administered 2021-02-25: 800 mg via ORAL
  Filled 2021-02-25: qty 1

## 2021-02-25 MED ORDER — MECLIZINE HCL 25 MG PO TABS: 25.0000 mg | ORAL_TABLET | Freq: Three times a day (TID) | ORAL | 0 refills | Status: DC | PRN

## 2021-02-25 NOTE — ED Provider Notes (Signed)
[facility] EMERGENCY DEPARTMENT Provider Note   CSN: [id number] Arrival date & time: 02/24/21  2311     History Chief Complaint  Patient presents with  . Seizures  . Dizziness    Debbie [name] is a 40 y.o. female.  The history is provided by the patient and medical records.    40 y.o. F with hx of anxiety, depression, migraine headaches, presenting to the ED for seizure.  Patient states she started new workout regimen with her sister, may have overdid it at the gym today.  Went home and took epsom salt bath and shortly afterwards began feeling poorly.  States she felt seizure coming on and then had one.  Unsure how long this lasted, states she had similar episode last week.  Husband videotaped episode last week and seen in the ED afterwards.  States ever since then she has felt dizzy, her ears have been ringing and something feels off.  She states when she opens her eyes or moves her head she gets really dizzy and feels nauseated.  She has not had any falls or syncopal events.  She does report history of vertigo.  States she has called neurology and cannot get appointment until 05/07/21.  Of note, patient seen in the ED 02/15/21 for similar, seizure activity was described as "eyelid fluttering".  She had formal neurology evaluation including CTA head/neck along with MRI which were unremarkable.  During that visit, she did admit to a lot of personal and work related stress worsening her anxiety/depression.    Past Medical History:  Diagnosis Date  . Anxiety    self reported  . Depression    self reported  . Migraine   . No pertinent past medical history     Patient Active Problem List   Diagnosis Date Noted  . Degenerative disc disease, lumbar 01/29/2021  . Myofascial pain dysfunction syndrome 12/25/2020  . Nonallopathic lesion of cervical region 12/25/2020  . 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 TBI (traumatic brain injury) ([place])  01/18/2020  . 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 Insomnia 12/10/2017  . Chronic migraine without aura without status migrainosus, not intractable 03/05/2017    Past Surgical History:  Procedure Laterality Date  . CESAREAN SECTION  2013  . laparscopy    . SHOULDER CAPSULORRHAPHY       OB History    Gravida  2   Para      Term      Preterm      AB  1   Living        SAB  1   IAB      Ectopic      Multiple      Live Births              Family History  Problem Relation Age of Onset  . Heart disease Maternal Grandfather   . Cerebral aneurysm Sister   . Migraines Sister     Social History   Tobacco Use  . Smoking status: Never Smoker  . Smokeless tobacco: Never Used  Vaping Use  . Vaping Use: Never used  Substance Use Topics  . Alcohol use: No  . Drug use: No    Home Medications Prior to Admission medications   Medication Sig Start Date End Date Taking? Authorizing Provider  Botulinum Toxin Type A (BOTOX) 200 units SOLR Inject 155 Units as directed every 3 (three) months. Inject 155units to  head and neck intramuscularly every 3 months. 06/11/20   Lomax, Amy, NP  cyclobenzaprine (FLEXERIL) 5 MG tablet TAKE 1 TABLET BY MOUTH THREE TIMES A DAY AS NEEDED FOR MUSCLE SPASMS 08/24/20   Lomax, Amy, NP  DULoxetine (CYMBALTA) 30 MG capsule TAKE 1 CAPSULE BY MOUTH EVERY DAY 10/25/20   Nevada Crane, MD  gabapentin (NEURONTIN) 100 MG capsule Take 2 capsules (200 mg total) by mouth at bedtime. 12/25/20   Lyndal Pulley, DO  ibuprofen (ADVIL,MOTRIN) 600 MG tablet Take 1 tablet (600 mg total) by mouth every 6 (six) hours as needed for moderate pain. 11/26/16   Melony Overly, MD  prochlorperazine (COMPAZINE) 10 MG tablet Take 1 tablet (10 mg total) by mouth every 6 (six) hours as needed for nausea or vomiting. 12/01/19   Lomax, Amy, NP  Rimegepant Sulfate (NURTEC) 75 MG TBDP Take 75 mg by mouth daily as needed (take for abortive  therapy of migraine, no more than 1 tablet in 24 hours or 10 per month). 04/30/20   Lomax, Amy, NP  SUMAtriptan (IMITREX) 6 MG/0.5ML SOLN injection Inject 0.5 mLs (6 mg total) into the skin every 2 (two) hours as needed for migraine or headache. No more then 2 injections in 24hrs 04/28/19   Lomax, Amy, NP    Allergies    Tylenol [acetaminophen], Aspirin, Benadryl [diphenhydramine hcl], Dilaudid [hydromorphone hcl], Penicillins, and Prednisone  Review of Systems   Review of Systems  Neurological: Positive for dizziness and seizures.  All other systems reviewed and are negative.   Physical Exam Updated Vital Signs BP (!) 140/91 (BP Location: Left Arm)   Pulse (!) 116   Temp 98.5 F (36.9 C) (Oral)   Resp 20   Ht 5\' 3"  (1.6 m)   Wt 102.1 kg   SpO2 98%   BMI 39.86 kg/m   Physical Exam Vitals and nursing note reviewed.  Constitutional:      General: She is not in acute distress.    Appearance: She is well-developed. She is not diaphoretic.  HENT:     Head: Normocephalic and atraumatic.     Right Ear: External ear normal.     Left Ear: External ear normal.  Eyes:     Conjunctiva/sclera: Conjunctivae normal.     Pupils: Pupils are equal, round, and reactive to light.     Comments: PERRL, dizziness elicited when attempting EOMs and immediately closes eyes again  Neck:     Comments: No rigidity, no meningismus Cardiovascular:     Rate and Rhythm: Normal rate and regular rhythm.     Heart sounds: Normal heart sounds. No murmur heard.   Pulmonary:     Effort: Pulmonary effort is normal. No respiratory distress.     Breath sounds: Normal breath sounds. No wheezing or rhonchi.  Abdominal:     General: Bowel sounds are normal.     Palpations: Abdomen is soft.     Tenderness: There is no abdominal tenderness. There is no guarding.  Musculoskeletal:        General: Normal range of motion.     Cervical back: Full passive range of motion without pain, normal range of motion and neck  supple. No rigidity.  Skin:    General: Skin is warm and dry.     Findings: No rash.  Neurological:     Mental Status: She is alert and oriented to person, place, and time.     Cranial Nerves: No cranial nerve deficit.     Sensory:  No sensory deficit.     Motor: No tremor or seizure activity.     Comments: AAOx3, lying with eyes closed, complaints of dizziness when opening eyes or moving head, answering questions and following commands appropriately; equal strength UE and LE bilaterally; CN grossly intact; moves all extremities appropriately without ataxia; no focal neuro deficits or facial asymmetry appreciated  Psychiatric:        Behavior: Behavior normal.        Thought Content: Thought content normal.     ED Results / Procedures / Treatments   Labs (all labs ordered are listed, but only abnormal results are displayed) Labs Reviewed  BASIC METABOLIC PANEL - Abnormal; Notable for the following components:      Result Value   Glucose, Bld 114 (*)    All other components within normal limits  CBC - Abnormal; Notable for the following components:   WBC 12.1 (*)    All other components within normal limits  CBG MONITORING, ED - Abnormal; Notable for the following components:   Glucose-Capillary 130 (*)    All other components within normal limits  I-STAT BETA HCG BLOOD, ED ([facility], [facility], [facility] ONLY)  I-STAT BETA HCG BLOOD, ED ([facility], [facility], [facility] ONLY)  TROPONIN I (HIGH SENSITIVITY)    EKG EKG Interpretation  Date/Time:  Sunday February 24 2021 23:37:25 EDT Ventricular Rate:  116 PR Interval:  158 QRS Duration: 78 QT Interval:  336 QTC Calculation: 467 R Axis:   110 Text Interpretation: Sinus tachycardia Right axis deviation Cannot rule out Anterior infarct , age undetermined Abnormal ECG Confirmed by Ripley Fraise [phone]) on 02/24/2021 11:50:28 PM   Radiology DG Chest 2 View  Result Date: 02/25/2021 CLINICAL DATA:  Chest pain EXAM: CHEST - 2 VIEW COMPARISON:  02/15/2021 FINDINGS: Heart  and mediastinal contours are within normal limits. No focal opacities or effusions. No acute bony abnormality. IMPRESSION: No active cardiopulmonary disease. Electronically Signed   By: Rolm Baptise M.D.   On: 02/25/2021 00:21   CT Head Wo Contrast  Result Date: 02/25/2021 CLINICAL DATA:  Headache and seizure. EXAM: CT HEAD WITHOUT CONTRAST TECHNIQUE: Contiguous axial images were obtained from the base of the skull through the vertex without intravenous contrast. COMPARISON:  February 15, 2021 FINDINGS: Brain: No evidence of acute infarction, hemorrhage, hydrocephalus, extra-axial collection or mass lesion/mass effect. Vascular: No hyperdense vessel or unexpected calcification. Skull: Normal. Negative for fracture or focal lesion. Sinuses/Orbits: Mild posterior left maxillary sinus mucosal thickening is seen. Other: None. IMPRESSION: 1. No acute intracranial process. Electronically Signed   By: Virgina Norfolk M.D.   On: 02/25/2021 00:14    Procedures Procedures   Medications Ordered in ED Medications  ibuprofen (ADVIL) tablet 800 mg (800 mg Oral Given 02/25/21 0058)  meclizine (ANTIVERT) tablet 25 mg (25 mg Oral Given 02/25/21 5784)    ED Course  I have reviewed the triage vital signs and the nursing notes.  Pertinent labs & imaging results that were available during my care of the patient were reviewed by me and considered in my medical decision making (see chart for details).    MDM Rules/Calculators/A&P  40 year old female presenting to the ED after reported seizure.  Since then has felt "dizzy and off".  She reports ringing of the ears and some nausea.  Also reported some chest tightness.  She is not able to give me any further details regarding the seizure.  She was seen here last week after similar episode and seizure was described as "  eyelid fluttering".  She is not currently on any antiepileptics.  She is lying in bed with her eyes closed but is awake, alert, appropriately oriented.  When  moving her head or deviating her eyes this does induce dizziness and nausea.  She does not have any focal neurologic deficits at present.  She has no meningeal signs.  CT of the head and chest x-ray have already been performed and are both negative.  Awaiting lab results.  Based on her exam, it does appear there is a vertiginous component to her dizziness.  She reports history of same.  Will give dose of Motrin for headache and meclizine for dizziness.  Will reassess.  2:05 AM Patient reports feeling better after medications here.  She has been able to get up out of bed and ambulate in room unassisted and use bedside commode.  Labs reassuring.  No vomiting.  No seizure activity observed here in the ED.  She has had extensive recent evaluation for same including CTA head/neck and MRI.  Do not feel further advanced scans need to be repeated at this time.  She does have OP neurology that she can follow-up with.  Feel she is stable for discharge home at this point.  Rx meclizine, zofran.  Return here for new concerns.  Final Clinical Impression(s) / ED Diagnoses Final diagnoses:  Dizziness  Nonintractable headache, unspecified chronicity pattern, unspecified headache type    Rx / DC Orders ED Discharge Orders         Ordered    meclizine (ANTIVERT) 25 MG tablet  3 times daily PRN        02/25/21 0210    ondansetron (ZOFRAN ODT) 4 MG disintegrating tablet  Every 8 hours PRN        02/25/21 0211           Larene Pickett, PA-C 02/25/21 6440    Ripley Fraise, MD 02/25/21 [phone]

## 2021-02-25 NOTE — Discharge Instructions (Addendum)
Your work-up today was reassuring. Can use the meclizine as needed for recurrent dizziness/vertigo symptoms.  Can use zofran for nausea.   I would check with neurology office to see if they can possibly get you in any sooner. Return here for any new/acute changes.

## 2021-03-06 ENCOUNTER — Ambulatory Visit: Payer: BC Managed Care – PPO | Admitting: Family Medicine

## 2021-03-22 ENCOUNTER — Encounter: Payer: Self-pay | Admitting: Family Medicine

## 2021-03-25 ENCOUNTER — Ambulatory Visit (INDEPENDENT_AMBULATORY_CARE_PROVIDER_SITE_OTHER): Payer: BC Managed Care – PPO | Admitting: Psychiatry

## 2021-03-25 ENCOUNTER — Encounter (HOSPITAL_COMMUNITY): Payer: Self-pay | Admitting: Psychiatry

## 2021-03-25 ENCOUNTER — Other Ambulatory Visit: Payer: Self-pay

## 2021-03-25 VITALS — BP 113/78 | HR 74 | Ht 63.0 in | Wt 225.0 lb

## 2021-03-25 DIAGNOSIS — F900 Attention-deficit hyperactivity disorder, predominantly inattentive type: Secondary | ICD-10-CM

## 2021-03-25 DIAGNOSIS — F0630 Mood disorder due to known physiological condition, unspecified: Secondary | ICD-10-CM

## 2021-03-25 DIAGNOSIS — G43709 Chronic migraine without aura, not intractable, without status migrainosus: Secondary | ICD-10-CM | POA: Diagnosis not present

## 2021-03-25 MED ORDER — NURTEC 75 MG PO TBDP: 75.0000 mg | ORAL_TABLET | Freq: Every day | ORAL | 11 refills | Status: DC | PRN

## 2021-03-25 NOTE — Progress Notes (Signed)
[facility] MD OP Progress Note  03/25/2021 9:23 AM Debbie [name]  MRN:  [id number]  Chief Complaint: " I recently started keto diet and that has helped. I have been to the hospital 2 times in past couple of months."   HPI: Patient reported that she has had to visit the hospital on 2 separate occasions over the last couple of months.   As per EMR, patient went to [facility] ED on March 25 with complaints of dizziness and on investigation she was found to have sinus tachycardia with left posterior fascicular block.  She was also seen by neurology during her ED visit and was assessed to have normal CTA of head and neck and MRI.  There was concern for seizure and she was referred for EEG after discharge. She went to the ED again on April 3 for a seizure.  Repeat CT of the head and chest x-ray were done and both were WNL.  She was discharged with a prescription for Antivert and Zofran as needed.  Today, patient stated that during her ED visit she was noted to have nodules on her thyroid and when she brought this up with her PCP after discharge he dismissed it.  She stated that she feels like nobody is taking her seriously and she is very concerned about that.  She stated that she wants to get a second opinion regarding this.  She is concerned that the nodules on her thyroid can be something serious as she has had some difficulty with her voice. She stated that she is looking for a new PCP. She also informed that she had a very hard time getting an earlier appointment scheduled for her EGD.  She finally got an EEG appointment scheduled for later this month.  She had seen a different neurologist who had recommended that she returns back to her original neurology provider as there is nothing new that he could offer. She stated that she started running behind these providers to give her the best help.  She stated that her PCPs main focus has been her weight.  She informed that she started keto diet along with  intermittent fasting last week.  Ever since she started consuming more fat content as per keto diet she feels she is better.  She feels her energy levels are improved and she has not had any seizure-like episodes.  She is waiting for the EEG to be completed later this month.  She asked for suggestions for other PCPs in the area as she was still for second opinion regarding her other health conditions.  Writer gave her a couple of suggestions.  Patient stated that she tried taking Cymbalta again but it caused her to feel really dizzy the following day therefore she stopped taking it.  She stated that her son is doing well and she feels well supported by her husband now.  She stated that her stress at home is reduced.  Her work is going okay for now.   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 Chronic migraine without aura without status migrainosus, not intractable  G43.709   3. ADHD (attention deficit hyperactivity disorder), inattentive type  F90.0     Past Psychiatric History: Depression, TBI, ADHD  Past Medical History:  Past Medical History:  Diagnosis Date  . Anxiety    self reported  . Depression    self reported  . Migraine   .  No pertinent past medical history     Past Surgical History:  Procedure Laterality Date  . CESAREAN SECTION  2013  . laparscopy    . SHOULDER CAPSULORRHAPHY      Family Psychiatric History: Sister-migraines  Family History:  Family History  Problem Relation Age of Onset  . Heart disease Maternal Grandfather   . Cerebral aneurysm Sister   . Migraines Sister     Social History:  Social History   Socioeconomic History  . Marital status: Married    Spouse name: Not on file  . Number of children: 1  . Years of education: Not on file  . Highest education level: Bachelor's degree (e.g., BA, AB, BS)  Occupational History  . Not on file  Tobacco Use  . Smoking status: Never Smoker   . Smokeless tobacco: Never Used  Vaping Use  . Vaping Use: Never used  Substance and Sexual Activity  . Alcohol use: No  . Drug use: No  . Sexual activity: Yes    Birth control/protection: I.U.D.  Other Topics Concern  . Not on file  Social History Narrative   Lives at home with her son & his father   Right handed   Drinks 1 cup of caffeine daily   Social Determinants of Health   Financial Resource Strain: Not on file  Food Insecurity: Not on file  Transportation Needs: Not on file  Physical Activity: Not on file  Stress: Not on file  Social Connections: Not on file    Allergies:  Allergies  Allergen Reactions  . Tylenol [Acetaminophen] Anaphylaxis  . Aspirin Other (See Comments)    Unknown childhood rxn  . Benadryl [Diphenhydramine Hcl] Itching  . Buspirone     Other reaction(s): migraines  . Citalopram Hydrobromide     Other reaction(s): agitation and dizziness  . Dilaudid [Hydromorphone Hcl] Itching    Pill only  . Effexor Xr [Venlafaxine]     Other reaction(s): More depressed (11/2017-neurology)  . Penicillins     hives  . Prednisone     Swelling, hives  . Sulfamethoxazole-Trimethoprim     Other reaction(s): swelling (2017)  . Zoloft [Sertraline]     Other reaction(s): nausea and swelling  . Latex Rash    Metabolic Disorder Labs: No results found for: HGBA1C, MPG No results found for: PROLACTIN No results found for: CHOL, TRIG, HDL, CHOLHDL, VLDL, LDLCALC No results found for: TSH  Therapeutic Level Labs: No results found for: LITHIUM No results found for: VALPROATE No components found for:  CBMZ  Current Medications: Current Outpatient Medications  Medication Sig Dispense Refill  . Botulinum Toxin Type A (BOTOX) 200 units SOLR Inject 155 Units as directed every 3 (three) months. Inject 155units to head and neck intramuscularly every 3 months. 1 each 3  . cyclobenzaprine (FLEXERIL) 5 MG tablet TAKE 1 TABLET BY MOUTH THREE TIMES A DAY AS NEEDED  FOR MUSCLE SPASMS (Patient taking differently: Take 5 mg by mouth 3 (three) times daily as needed for muscle spasms.) 30 tablet 6  . DULoxetine (CYMBALTA) 30 MG capsule TAKE 1 CAPSULE BY MOUTH EVERY DAY 90 capsule 1  . gabapentin (NEURONTIN) 100 MG capsule Take 2 capsules (200 mg total) by mouth at bedtime. (Patient taking differently: Take 200 mg by mouth at bedtime as needed (joint pain).) 180 capsule 0  . ibuprofen (ADVIL) 200 MG tablet Take 800 mg by mouth every 6 (six) hours as needed for headache or moderate pain.    Marland Kitchen loratadine (CLARITIN)  10 MG tablet Take 10 mg by mouth daily as needed for allergies.    Marland Kitchen meclizine (ANTIVERT) 25 MG tablet Take 1 tablet (25 mg total) by mouth 3 (three) times daily as needed for dizziness. 30 tablet 0  . ondansetron (ZOFRAN ODT) 4 MG disintegrating tablet Take 1 tablet (4 mg total) by mouth every 8 (eight) hours as needed for nausea. 10 tablet 0  . prochlorperazine (COMPAZINE) 10 MG tablet Take 1 tablet (10 mg total) by mouth every 6 (six) hours as needed for nausea or vomiting. 30 tablet 6  . Rimegepant Sulfate (NURTEC) 75 MG TBDP Take 75 mg by mouth daily as needed (take for abortive therapy of migraine, no more than 1 tablet in 24 hours or 10 per month). 8 tablet 11  . SUMAtriptan (IMITREX) 6 MG/0.5ML SOLN injection Inject 0.5 mLs (6 mg total) into the skin every 2 (two) hours as needed for migraine or headache. No more then 2 injections in 24hrs 10 vial 1   No current facility-administered medications for this visit.      Psychiatric Specialty Exam: Review of Systems  There were no vitals taken for this visit.There is no height or weight on file to calculate BMI.  General Appearance: Fairly Groomed  Eye Contact:  Good  Speech:  Clear and Coherent and Normal Rate  Volume:  Normal  Mood:  Euthymic  Affect:  Congruent  Thought Process:  Goal Directed, Linear and Descriptions of Associations: Intact  Orientation:  Full (Time, Place, and Person)   Thought Content: Logical   Suicidal Thoughts:  No  Homicidal Thoughts:  No  Memory:  Recent;   Good Remote;   Fair  Judgement:  Fair  Insight:  Fair  Psychomotor Activity:  Normal  Concentration:  Concentration: Good and Attention Span: Good  Recall:  Good  Fund of Knowledge: Good  Language: Good  Akathisia:  Negative  Handed:  Right  AIMS (if indicated): not done  Assets:  Communication Skills Desire for Improvement Financial Resources/Insurance Housing  ADL's:  Intact  Cognition: WNL     Assessment and Plan: Patient remains preoccupied with her health conditions and is concerned that most of the specialists that are taking care of her did not seem to be seriously concerned about her health.  She wants to get to the bottom of thyroid nodules that were discovered during her hospital stay.  She brought this up to her PCP but did not feel like they were listening to her therefore wants to get a second opinion.  She is going to get a repeat EEG done later this month and is going to continue follow-up with neurology. She has tried Cymbalta and several other psychotropic medications but all of them caused her to have side effects therefore she does not want to take any for now.  1. 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 Chronic migraine without aura without status migrainosus, not intractable  3. ADHD (attention deficit hyperactivity disorder), inattentive type  Writer informed the patient that writer is leaving the clinic end of June and therefore her care is being transferred to a different provider in the clinic.  In the meantime she is going to reach out to different primary care office and see if she can get in touch with them and get second opinion on her health conditions. She is scheduled to undergo EEG later this month.  Follow-up in 3 months.  [place] Crane, MD 03/25/2021, 9:23 AM

## 2021-04-03 ENCOUNTER — Ambulatory Visit: Payer: BC Managed Care – PPO | Admitting: Family Medicine

## 2021-04-11 NOTE — Progress Notes (Signed)
[facility] [address] [place] Phone: [phone] Subjective:   I Kandace Blitz am serving as a Education administrator for Dr. Hulan Saas.  This visit occurred during the SARS-CoV-2 public health emergency.  Safety protocols were in place, including screening questions prior to the visit, additional usage of staff PPE, and extensive cleaning of exam room while observing appropriate contact time as indicated for disinfecting solutions.   I'm seeing this patient by the request  of:  Gaynelle Arabian, MD  CC: Neck pain and headache follow-up  [id number]  Debbie [name] is a 40 y.o. female coming in with complaint of back and neck pain. OMT 01/29/2021. Patient states she is doing well. Been able to work out more recently. States she has right shoulder pain with certain ROM.  Patient unfortunately did have an episode where it seemed that patient did have a seizure.  Did have imaging including a CT angio of the head and neck.  Patient did have an incidental finding of a 1.8 cm left thyroid nodule noted.  Also reviewing patient's imaging did have an MRI of the brain that was completely unremarkable.  Patient then had follow-up and did have a CT of the head done that showed no abnormality or intracranial process noted.  Patient states that that seemed to go away.  Continues to have the intermittent headaches though.  Patient states continues to have the neck pain and the lower back pain as well. Medications patient has been prescribed: Gabapentin           Reviewed prior external information including notes and imaging from previsou exam, outside providers and external EMR if available.   As well as notes that were available from care everywhere and other healthcare systems.  Past medical history, social, surgical and family history all reviewed in electronic medical record.  No pertanent information unless stated regarding to the chief complaint.   Past  Medical History:  Diagnosis Date  . Anxiety    self reported  . Depression    self reported  . Migraine   . No pertinent past medical history     Allergies  Allergen Reactions  . Tylenol [Acetaminophen] Anaphylaxis  . Aspirin Other (See Comments)    Unknown childhood rxn  . Benadryl [Diphenhydramine Hcl] Itching  . Buspirone     Other reaction(s): migraines  . Citalopram Hydrobromide     Other reaction(s): agitation and dizziness  . Dilaudid [Hydromorphone Hcl] Itching    Pill only  . Effexor Xr [Venlafaxine]     Other reaction(s): More depressed (11/2017-neurology)  . Penicillins     hives  . Prednisone     Swelling, hives  . Sulfamethoxazole-Trimethoprim     Other reaction(s): swelling (2017)  . Zoloft [Sertraline]     Other reaction(s): nausea and swelling  . Latex Rash     Review of Systems:  No  visual changes, nausea, vomiting, diarrhea, constipation, dizziness, abdominal pain, skin rash, fevers, chills, night sweats, weight loss, swollen lymph nodes, joint swelling, chest pain, shortness of breath, mood changes. POSITIVE muscle aches, headache, body aches  Objective  Blood pressure 110/78, pulse 70, height 5\' 3"  (1.6 m), weight 222 lb (100.7 kg), last menstrual period 03/26/2021, SpO2 100 %, unknown if currently breastfeeding.   General: No apparent distress alert and oriented x3 mood and affect normal, dressed appropriately.  HEENT: Pupils equal, extraocular movements intact unable to palpate the mass noted of the left side of the thyroid  Respiratory: Patient's speak in full sentences and does not appear short of breath  Cardiovascular: No lower extremity edema, non tender, no erythema  Gait normal with good balance and coordination.  MSK: Right shoulder exam shows the patient does have positive impingement noted.  Positive speeds also noted.  Patient does have large incision from the anterior aspect of the shoulder from previous surgery.  Patient does have  positive crossover and mild pain over the acromioclavicular process.  Rotator cuff strength does appear to be intact. Back -low back exam does show that patient does have some mild tightness noted.  Patient does have some poor core strength but has made improvement from previous exam.  Patient is minorly tender to palpation over the sacroiliac joint right greater than left.  Osteopathic findings  C2 flexed rotated and side bent right T5 extended rotated and side bent left L2 flexed rotated and side bent right Sacrum right on right       Assessment and Plan:  Degenerative disc disease, lumbar Patient is responding relatively well to osteopathic manipulation.  We continue to work on this.  Patient does still have significant amount of arthritic changes of the neck and recently was found to have a potential mass of the thyroid.  Regarding the lower back so we will continue the same medications at this time.  Patient has the gabapentin as well as the duloxetine and does have a muscle relaxer if needed.  Follow-up with me again 6 to 8 weeks  Degenerative disc disease, cervical Advanced arthritic changes of the neck noted.  Patient though recently did have a CT of the neck and patient does have a nearly 2 cm nodule noted of the thyroid.  Will refer to general surgery to discuss the possibility of biopsying and further treatment.  Patient has had significant difficulty with her weight.  Patient is continuing to find other providers.  Has a follow-up with her endocrinologist as well.  Follow-up with me again in 6 to 8 weeks  Thyroid mass Ordered a thyroid ultrasound to further evaluate the mass.  We will send patient also to general surgery to discuss the possibility of biopsy if necessary.  Follow-up with me again if needed but encouraged her to follow-up with primary care provider.  Right shoulder pain Seems to be more impingement.  Discussed with patient about icing regimen and home exercises.   Patient work with Product/process development scientist today.  X-rays are pending.  Differential includes the possibility of cervical radiculopathy.  The MRI of the neck secondary to the advanced arthritis to see if this could be contributing.  Patient has had a history of surgery of the right shoulder previously.  Follow-up again 6 to 8 weeks    Nonallopathic problems  Decision today to treat with OMT was based on Physical Exam  After verbal consent patient was treated with HVLA, ME, FPR techniques in cervical,  thoracic, lumbar, and sacral  areas avoided HVLA of the neck.  Patient tolerated the procedure well with improvement in symptoms  Patient given exercises, stretches and lifestyle modifications  See medications in patient instructions if given  Patient will follow up in 4-8 weeks      The above documentation has been reviewed and is accurate and complete Lyndal Pulley, DO       Note: This dictation was prepared with Dragon dictation along with smaller phrase technology. Any transcriptional errors that result from this process are unintentional.

## 2021-04-16 ENCOUNTER — Ambulatory Visit: Payer: BC Managed Care – PPO | Admitting: Family Medicine

## 2021-04-16 ENCOUNTER — Encounter: Payer: Self-pay | Admitting: Family Medicine

## 2021-04-16 ENCOUNTER — Other Ambulatory Visit: Payer: Self-pay

## 2021-04-16 ENCOUNTER — Ambulatory Visit (INDEPENDENT_AMBULATORY_CARE_PROVIDER_SITE_OTHER): Payer: BC Managed Care – PPO

## 2021-04-16 VITALS — BP 110/78 | HR 70 | Ht 63.0 in | Wt 222.0 lb

## 2021-04-16 DIAGNOSIS — M5030 Other cervical disc degeneration, unspecified cervical region: Secondary | ICD-10-CM | POA: Insufficient documentation

## 2021-04-16 DIAGNOSIS — M542 Cervicalgia: Secondary | ICD-10-CM | POA: Diagnosis not present

## 2021-04-16 DIAGNOSIS — G8929 Other chronic pain: Secondary | ICD-10-CM

## 2021-04-16 DIAGNOSIS — M9902 Segmental and somatic dysfunction of thoracic region: Secondary | ICD-10-CM

## 2021-04-16 DIAGNOSIS — M25511 Pain in right shoulder: Secondary | ICD-10-CM | POA: Diagnosis not present

## 2021-04-16 DIAGNOSIS — E079 Disorder of thyroid, unspecified: Secondary | ICD-10-CM

## 2021-04-16 DIAGNOSIS — M9901 Segmental and somatic dysfunction of cervical region: Secondary | ICD-10-CM

## 2021-04-16 DIAGNOSIS — M9904 Segmental and somatic dysfunction of sacral region: Secondary | ICD-10-CM

## 2021-04-16 DIAGNOSIS — M5136 Other intervertebral disc degeneration, lumbar region: Secondary | ICD-10-CM

## 2021-04-16 DIAGNOSIS — M9903 Segmental and somatic dysfunction of lumbar region: Secondary | ICD-10-CM | POA: Diagnosis not present

## 2021-04-16 DIAGNOSIS — M7918 Myalgia, other site: Secondary | ICD-10-CM

## 2021-04-16 IMAGING — DX DG SHOULDER 2+V*R*
3 series · 3 of 3 positions shown · non-contrast
Comparison: None.

CLINICAL DATA: 39-year-old female with right shoulder pain

EXAM:
RIGHT SHOULDER - 2+ VIEW

[shoulder ap (1 of 2)]
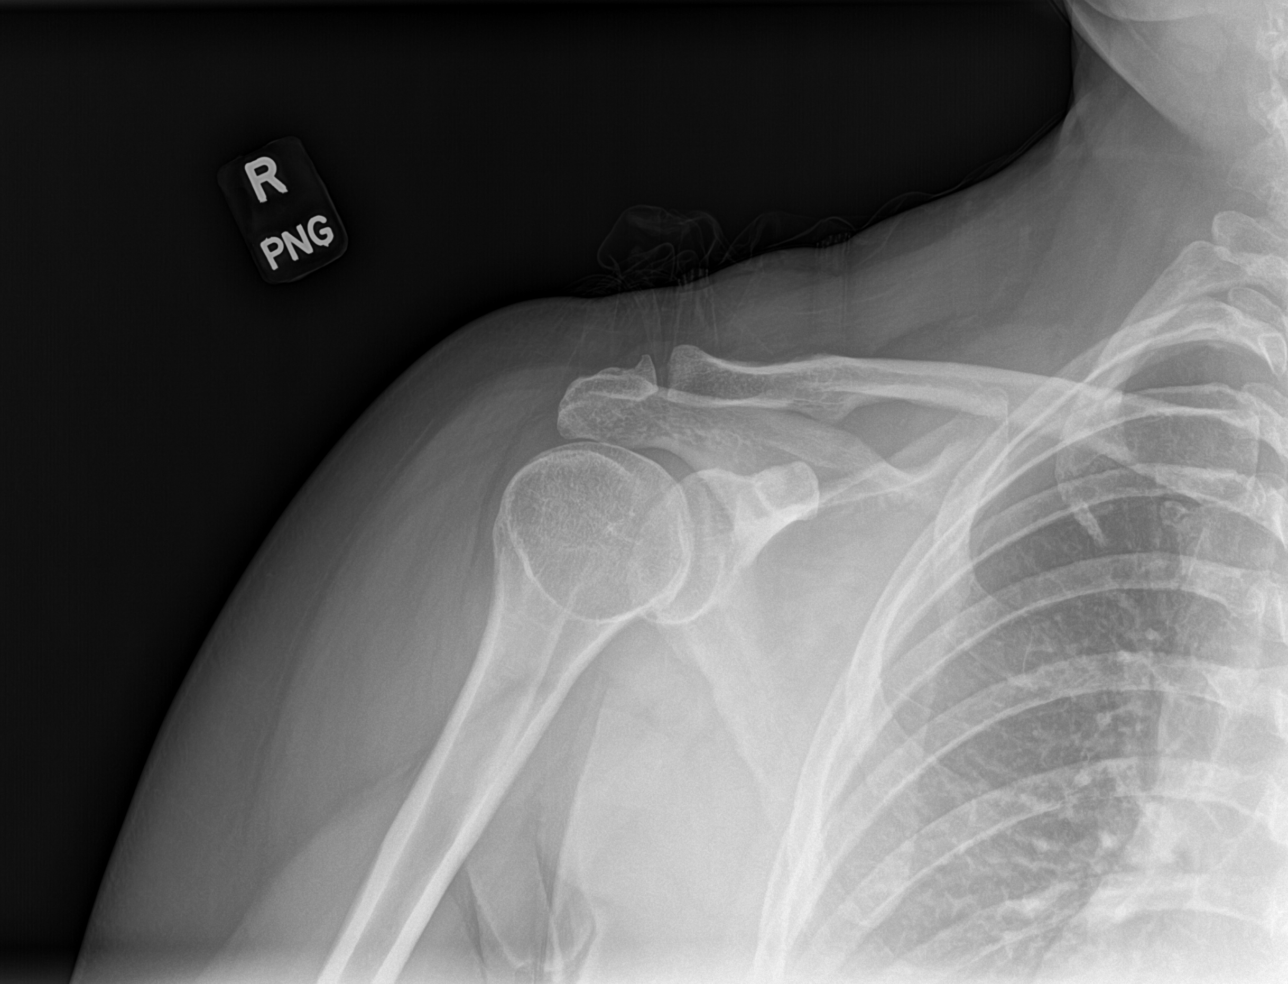

[shoulder ap (2 of 2)]
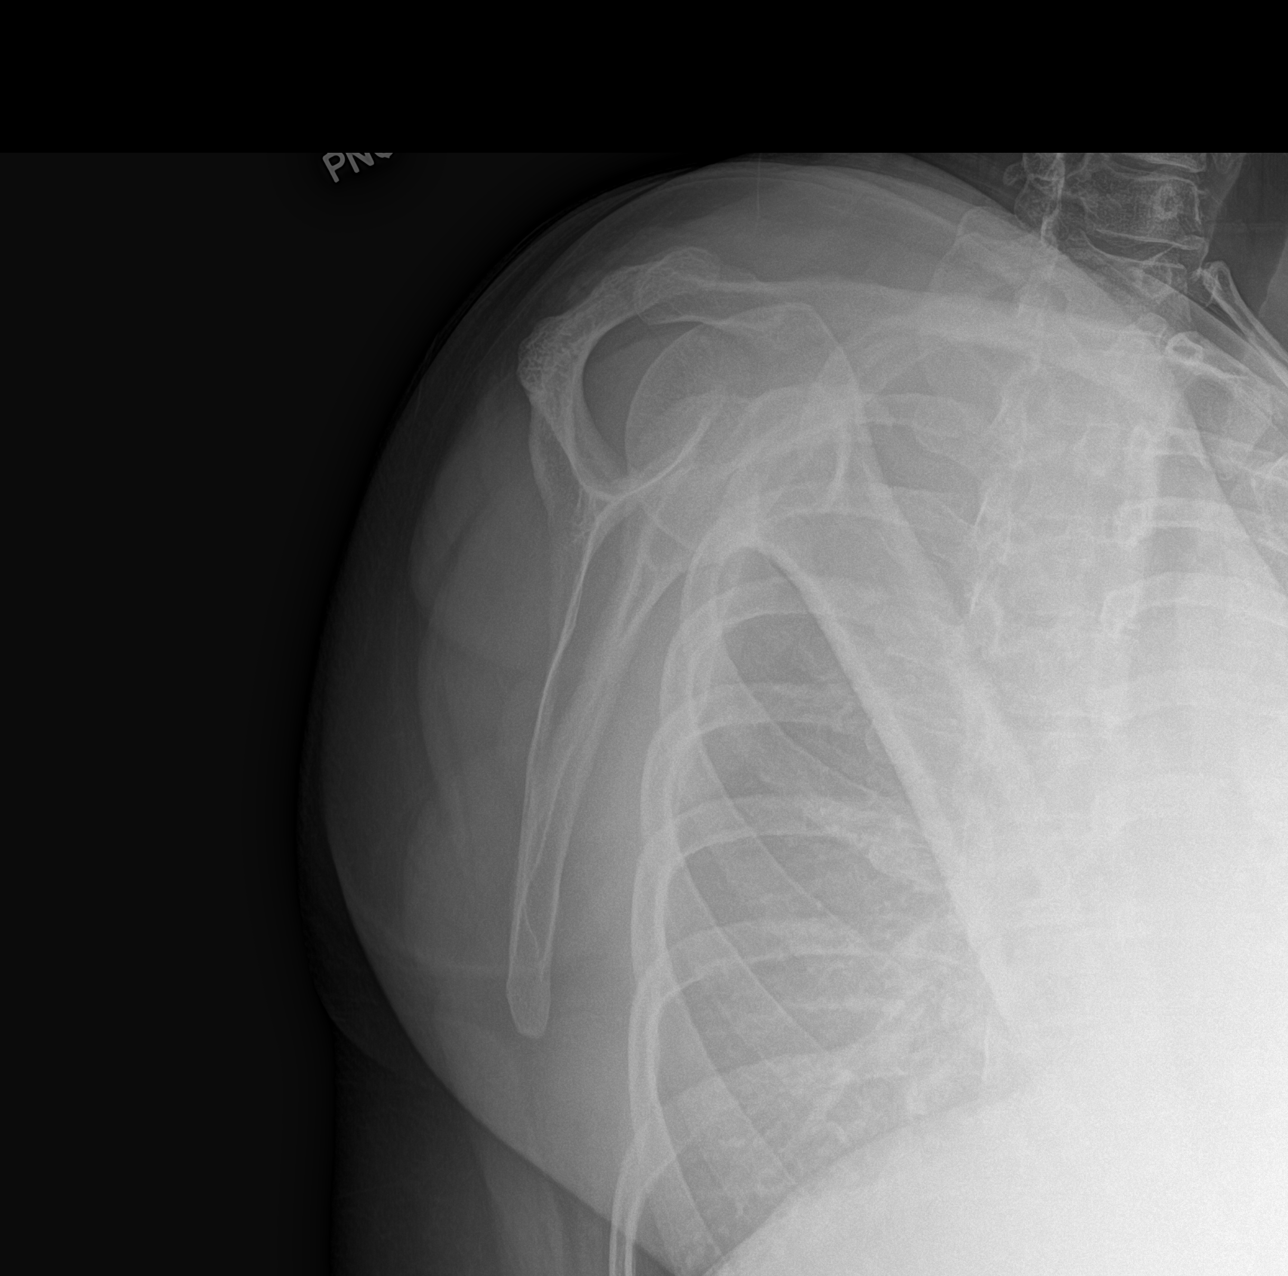

[shoulder axial]
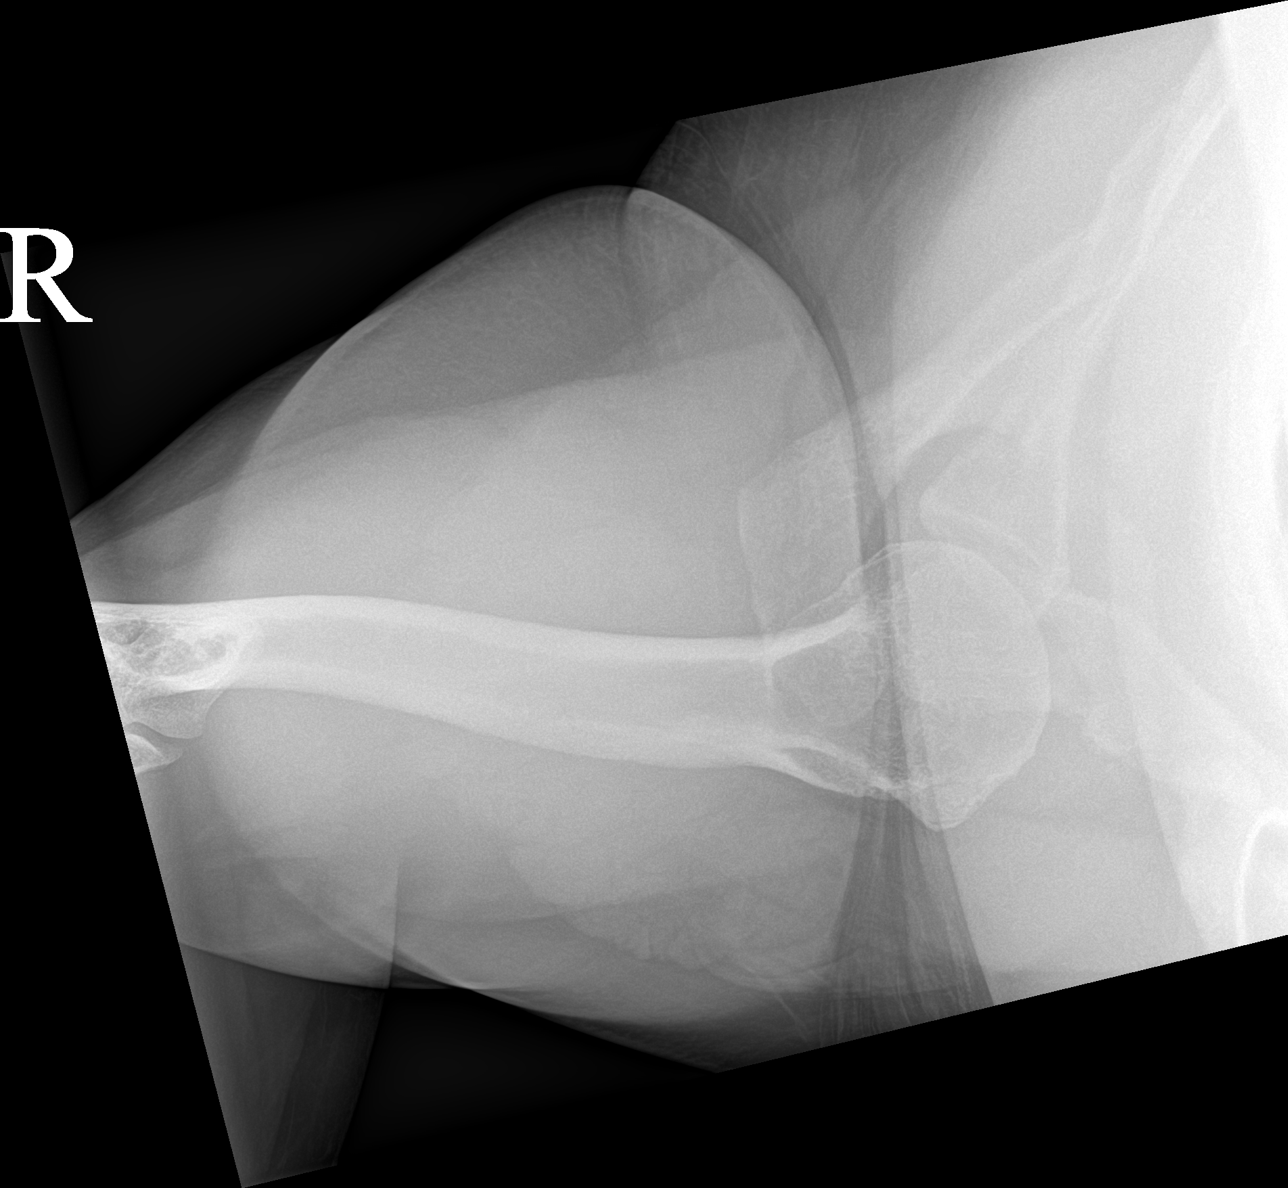

[3 of 3 positions shown; findings below may reference images not displayed]

FINDINGS: There is no evidence of fracture or dislocation. There is no
evidence of arthropathy or other focal bone abnormality. Soft
tissues are unremarkable.
IMPRESSION: Negative.

## 2021-04-16 NOTE — Assessment & Plan Note (Signed)
Seems to be more impingement.  Discussed with patient about icing regimen and home exercises.  Patient work with Product/process development scientist today.  X-rays are pending.  Differential includes the possibility of cervical radiculopathy.  The MRI of the neck secondary to the advanced arthritis to see if this could be contributing.  Patient has had a history of surgery of the right shoulder previously.  Follow-up again 6 to 8 weeks

## 2021-04-16 NOTE — Assessment & Plan Note (Signed)
Advanced arthritic changes of the neck noted.  Patient though recently did have a CT of the neck and patient does have a nearly 2 cm nodule noted of the thyroid.  Will refer to general surgery to discuss the possibility of biopsying and further treatment.  Patient has had significant difficulty with her weight.  Patient is continuing to find other providers.  Has a follow-up with her endocrinologist as well.  Follow-up with me again in 6 to 8 weeks

## 2021-04-16 NOTE — Patient Instructions (Addendum)
Good to see you Right shoulder xray Exercises for the shoulder Arm compression sleeve MRI of the neck General surgery for thyroid mass Keep appointment with endocrinology See me again in 6-8 weeks

## 2021-04-16 NOTE — Assessment & Plan Note (Signed)
Patient is responding relatively well to osteopathic manipulation.  We continue to work on this.  Patient does still have significant amount of arthritic changes of the neck and recently was found to have a potential mass of the thyroid.  Regarding the lower back so we will continue the same medications at this time.  Patient has the gabapentin as well as the duloxetine and does have a muscle relaxer if needed.  Follow-up with me again 6 to 8 weeks

## 2021-04-16 NOTE — Assessment & Plan Note (Signed)
Ordered a thyroid ultrasound to further evaluate the mass.  We will send patient also to general surgery to discuss the possibility of biopsy if necessary.  Follow-up with me again if needed but encouraged her to follow-up with primary care provider.

## 2021-04-17 ENCOUNTER — Encounter: Payer: Self-pay | Admitting: Neurology

## 2021-04-17 ENCOUNTER — Ambulatory Visit: Payer: BC Managed Care – PPO | Admitting: Neurology

## 2021-04-17 VITALS — BP 113/76 | HR 75 | Ht 63.5 in | Wt 225.0 lb

## 2021-04-17 DIAGNOSIS — M7918 Myalgia, other site: Secondary | ICD-10-CM

## 2021-04-17 DIAGNOSIS — R404 Transient alteration of awareness: Secondary | ICD-10-CM

## 2021-04-17 DIAGNOSIS — G43709 Chronic migraine without aura, not intractable, without status migrainosus: Secondary | ICD-10-CM
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
## 2021-04-17 NOTE — Patient Instructions (Signed)
EEG in the office and then inpatient unit   Seizure, Adult A seizure is a sudden burst of abnormal electrical and chemical activity in the brain. Seizures usually last from 30 seconds to 2 minutes. The abnormal activity temporarily interrupts normal brain function. Many types of seizures can affect adults. A seizure can cause many different symptoms depending on where in the brain it starts. What are the causes? Common causes of this condition include:  Fever or infection.  Brain injury, head trauma, bleeding in the brain, or a brain tumor.  Low levels of blood sugar or salt (sodium).  Kidney problems or liver problems.  Metabolic disorders or other conditions that are passed from parent to child (are inherited).  Reaction to a substance, such as a drug or a medicine, or suddenly stopping the use of a substance (withdrawal).  A stroke.  Developmental disorders such as autism spectrum disorder or cerebral palsy. In some cases, the cause of a seizure may not be known. Some people who have a seizure never have another one. A person who has repeated seizures over time without a clear cause has a condition called epilepsy. What increases the risk? You are more likely to develop this condition if:  You have a family history of epilepsy.  You have had a tonic-clonic seizure before. This type of seizure causes tightening (contraction) of the muscles of the whole body and loss of consciousness.  You have a history of head trauma, lack of oxygen at birth, or strokes. What are the signs or symptoms? There are many different types of seizures. The symptoms vary depending on the type of seizure you have. Symptoms occur during the seizure. They may also occur before a seizure (aura) and after a seizure (postictal). Symptoms may include the following: Symptoms during a seizure  Uncontrollable shaking (convulsions) with fast, jerky movements of muscles.  Stiffening of the body.  Breathing  problems.  Confusion, staring, or unresponsiveness.  Head nodding, eye blinking or fluttering, or rapid eye movements.  Drooling, grunting, or making clicking sounds with your mouth.  Loss of bladder control and bowel control. Symptoms before a seizure  Fear or anxiety.  Nausea.  Vertigo. This is a feeling like: ? You are moving when you are not. ? Your surroundings are moving when they are not.  Dj vu. This is a feeling of having seen or heard something before.  Odd tastes or smells.  Changes in vision, such as seeing flashing lights or spots. Symptoms after a seizure  Confusion.  Sleepiness.  Headache.  Sore muscles. How is this diagnosed? This condition may be diagnosed based on:  A description of your symptoms. Video of your seizures can be helpful.  Your medical history.  A physical exam. You may also have tests, including:  Blood tests.  CT scan.  MRI.  Electroencephalogram (EEG). This test measures electrical activity in the brain. An EEG can predict whether seizures will return.  A spinal tap, also called a lumbar puncture. This is the removal and testing of fluid that surrounds the brain and spinal cord. How is this treated? Most seizures will stop on their own in less than 5 minutes, and no treatment is needed. Seizures that last longer than 5 minutes will usually need treatment. Seizures may be treated with:  Medicines given through an IV.  Avoiding known triggers, such as medicines that you take for another condition.  Medicines to control seizures or prevent future seizures (antiepileptics), if epilepsy caused your seizures.  Medical devices to prevent and control seizures.  Surgery to stop seizures or to reduce how often seizures happen, if you have epilepsy that does not respond to medicines.  A diet low in carbohydrates and high in fat (ketogenic diet). Follow these instructions at home: Medicines  Take over-the-counter and  prescription medicines only as told by your health care provider.  Avoid any substances that may prevent your medicine from working properly, such as alcohol. Activity  Follow instructions about activities, such as driving or swimming, that would be dangerous if you had another seizure. Wait until your health care provider says it is safe to do them.  If you live in the [place] check with your local department of motor vehicles [facility]) to find out about local driving laws. Each state has specific rules about when you can legally drive again.  Get enough rest. Lack of sleep can make seizures more likely to occur. Educating others  Teach friends and family what to do if you have a seizure. They should: ? Help you get down to the ground, to prevent a fall. ? Cushion your head and move items away from your body. ? Loosen any tight clothing around your neck. ? Turn you on your side. If you vomit, this helps keep your airway clear. ? Know whether or not you need emergency care. ? Stay with you until you recover.  Also, tell them what not to do if you have a seizure. Tell them: ? They should not hold you down. Holding you down will not stop the seizure. ? They should not put anything in your mouth.   General instructions  Avoid anything that has ever triggered a seizure for you.  Keep a seizure diary. Record what you remember about each seizure, especially anything that might have triggered it.  Keep all follow-up visits. This is important. Contact a health care provider if:  You have another seizure or seizures. Call each time you have a seizure.  Your seizure pattern changes.  You continue to have seizures with treatment.  You have symptoms of an infection or illness. Either of these might increase your risk of having a seizure.  You are unable to take your medicine. Get help right away if:  You have: ? A seizure that does not stop after 5 minutes. ? Several seizures in a row  without a complete recovery between seizures. ? A seizure that makes it harder to breathe. ? A seizure that leaves you unable to speak or use a part of your body.  You do not wake up right away after a seizure.  You injure yourself during a seizure.  You have confusion or pain right after a seizure. These symptoms may represent a serious problem that is an emergency. Do not wait to see if the symptoms will go away. Get medical help right away. Call your local emergency services (911 in the [place] Do not drive yourself to the hospital. Summary  Seizures are caused by abnormal electrical and chemical activity in the brain. The activity disrupts normal brain function and can cause various symptoms.  Seizures have many causes, including illness, head injuries, low levels of blood sugar or salt, and certain conditions.  Most seizures will stop on their own in less than 5 minutes. Seizures that last longer than 5 minutes are a medical emergency and need treatment right away.  Many medicines are used to treat seizures. Take over-the-counter and prescription medicines only as told by your health care  provider. This information is not intended to replace advice given to you by your health care provider. Make sure you discuss any questions you have with your health care provider. Document Revised: 05/18/2020 Document Reviewed: 05/18/2020 Elsevier Patient Education  [employer].

## 2021-04-29 ENCOUNTER — Ambulatory Visit: Payer: BC Managed Care – PPO | Admitting: Neurology

## 2021-04-29 DIAGNOSIS — R4182 Altered mental status, unspecified: Secondary | ICD-10-CM | POA: Diagnosis not present

## 2021-04-29 DIAGNOSIS — R404 Transient alteration of awareness: Secondary | ICD-10-CM

## 2021-04-29 NOTE — Procedures (Signed)
    History:  Debbie [name] is a 40 year old patient with episodes of dizziness and seizure-like activity in the past.  The patient also has a history of frequent migraine type headaches.  The patient has had episodes of altered awareness, loss time, increased heart rate.  She is being evaluated for these episodes.  This is a routine EEG.  No skull defects are noted.  Medications include Flexeril, gabapentin, Claritin, Antivert, Zofran, Compazine, Nurtec, and Imitrex.  EEG classification: Normal awake and asleep  Description of the recording: The background rhythms of this recording consists of a fairly well modulated medium amplitude background activity of 11 Hz. As the record progresses, the patient initially is in the waking state, but appears to enter the early stage II sleep during the recording, with rudimentary sleep spindles and vertex sharp wave activity seen. During the wakeful state, photic stimulation is performed, and this results in a bilateral and symmetric photic driving response. Hyperventilation was also performed, and this results in a minimal buildup of the background rhythm activities without significant slowing seen. At no time during the recording does there appear to be evidence of spike or spike wave discharges or evidence of focal slowing. EKG monitor shows no evidence of cardiac rhythm abnormalities with a heart rate of 84.  Impression: This is a normal EEG recording in the waking and sleeping state. No evidence of ictal or interictal discharges were seen at any time during the recording.

## 2021-04-30 ENCOUNTER — Other Ambulatory Visit: Payer: Self-pay | Admitting: *Deleted

## 2021-04-30 ENCOUNTER — Telehealth: Payer: Self-pay | Admitting: *Deleted

## 2021-04-30 DIAGNOSIS — R404 Transient alteration of awareness: Secondary | ICD-10-CM

## 2021-04-30 NOTE — Telephone Encounter (Signed)
Spoke with patient. She is amenable to EMU referral with Dr Amalia Hailey (or his team) at [facility]. Pt aware this EEG was normal.

## 2021-04-30 NOTE — Telephone Encounter (Signed)
I called [facility] to refer this pt. Left a VM asking for a return call.

## 2021-04-30 NOTE — Telephone Encounter (Signed)
-----   Message from Melvenia Beam, MD sent at 04/30/2021 11:31 AM EDT ----- EEG Is normal. Please refer her to Dr. Amalia Hailey at [facility] his team) for evaluation and EEG Monitoring Unit admission for possible seizures thanks.   ----- Message ----- From: Kathrynn Ducking, MD Sent: 04/29/2021   4:36 PM EDT To: Melvenia Beam, MD

## 2021-04-30 NOTE — Progress Notes (Signed)
Spoke with patient. Routine EEG normal. Per Dr Jaynee Eagles, refer to Dr. Amalia Hailey at [place] (or his team) for evaluation and EEG Monitoring Unit admission for possible seizures. Patient is amenable to this plan and will await a call to schedule.

## 2021-05-01 ENCOUNTER — Ambulatory Visit
Admission: RE | Admit: 2021-05-01 | Discharge: 2021-05-01 | Disposition: A | Discharge status: Home / Self Care | Payer: BC Managed Care – PPO | Source: Ambulatory Visit | Attending: Family Medicine | Admitting: Family Medicine

## 2021-05-01 DIAGNOSIS — E079 Disorder of thyroid, unspecified: Secondary | ICD-10-CM

## 2021-05-01 DIAGNOSIS — M542 Cervicalgia: Secondary | ICD-10-CM

## 2021-05-01 IMAGING — US US THYROID
1 series · 13 of 25 positions shown · non-contrast
Comparison: Ultrasound thyroid [DATE]

CLINICAL DATA: Neck pain

Thyroid nodule follow-up
EXAM:
THYROID ULTRASOUND
TECHNIQUE: Ultrasound examination of the thyroid gland and adjacent soft
tissues was performed.

[Series 1: us thyroid · 0.07mm/px · 52 acquisitions, 13 frames shown]
[im 1/52]
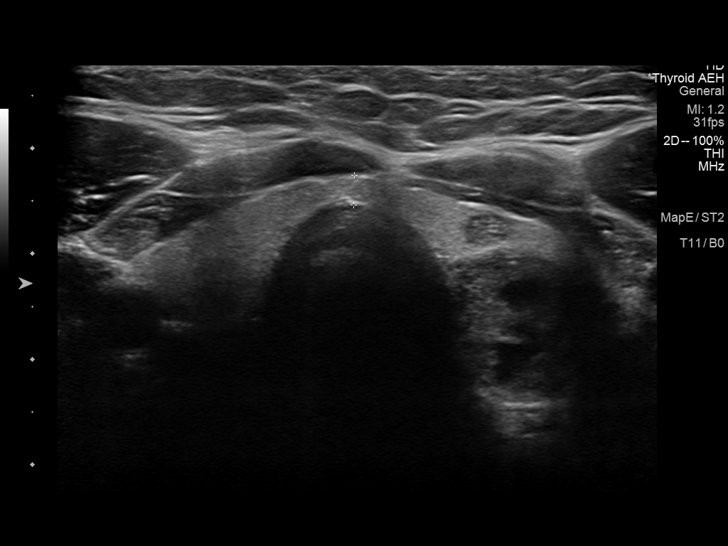
[im 5/52]
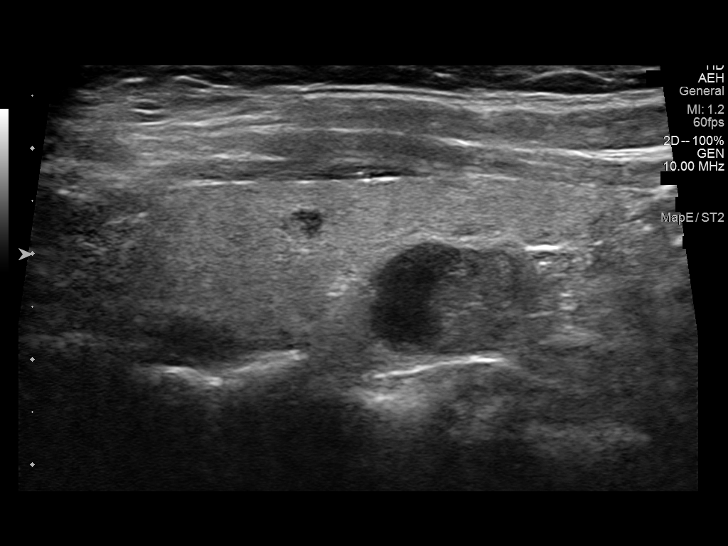
[im 9/52]
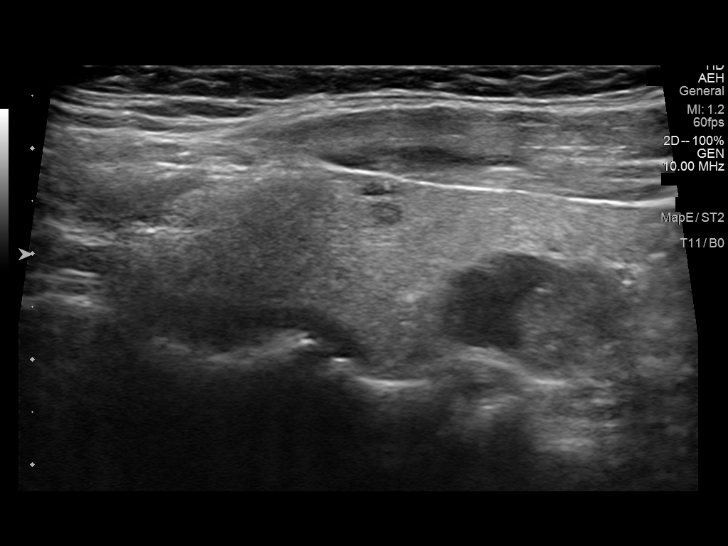
[im 13/52]
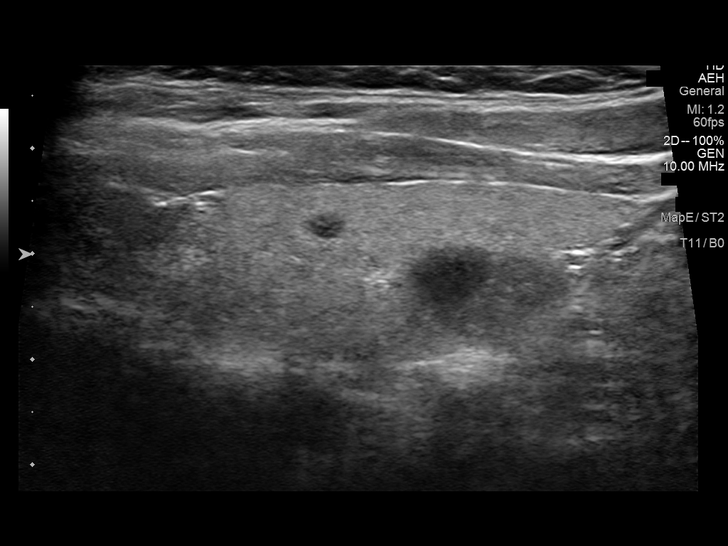
[im 18/52]
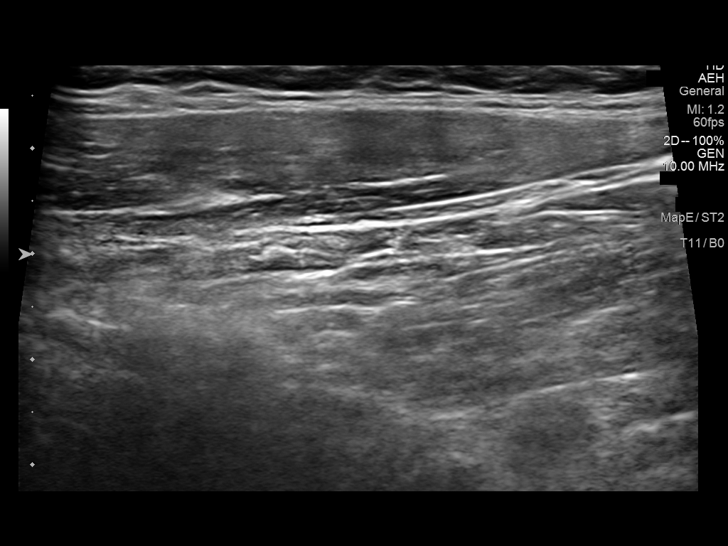
[im 22/52]
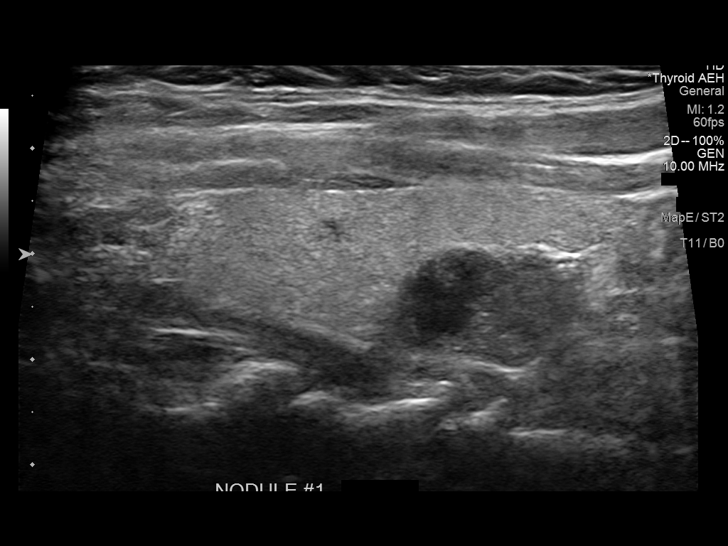
[im 26/52]
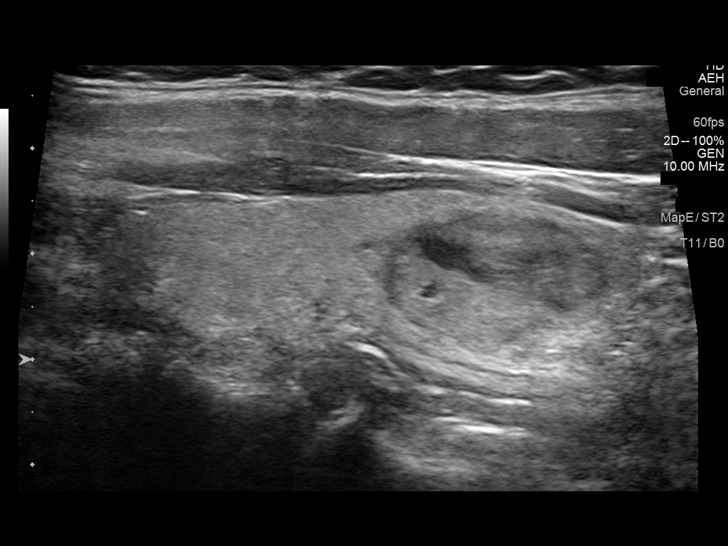
[im 30/52]
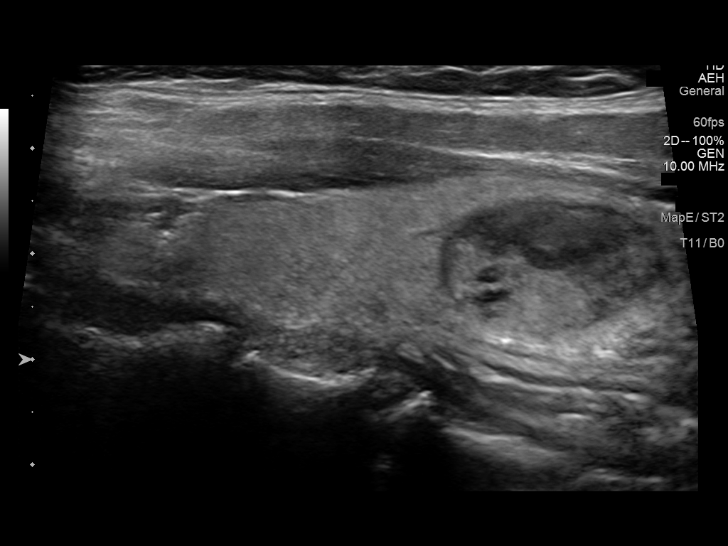
[im 35/52]
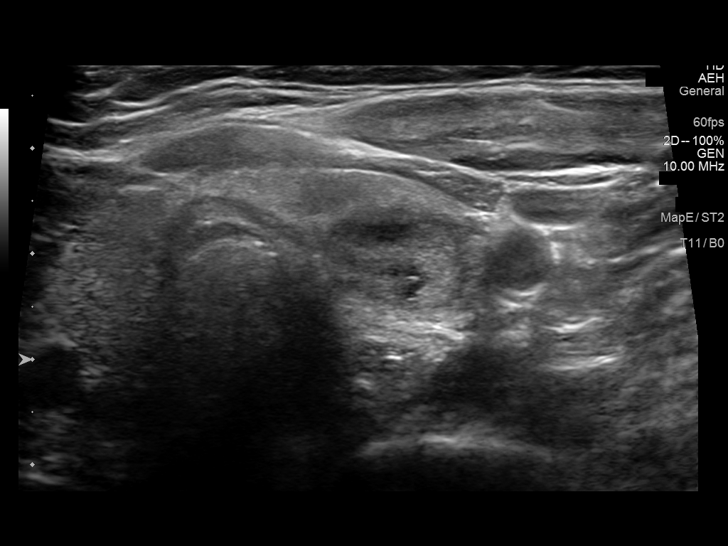
[im 39/52]
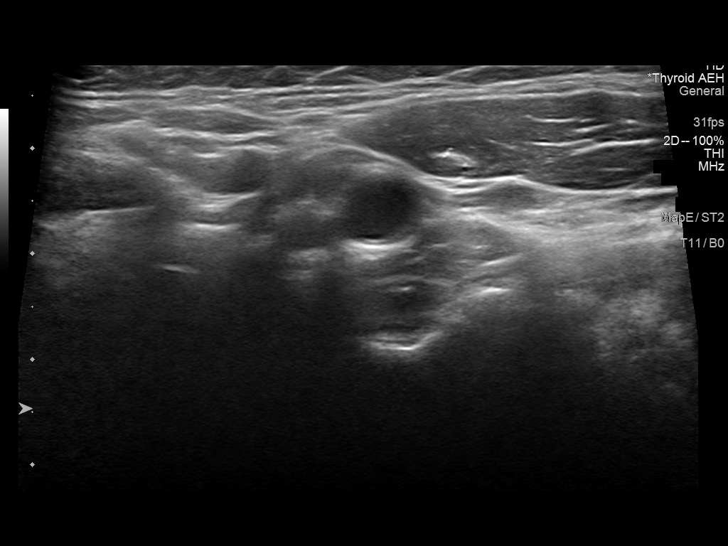
[im 43/52]
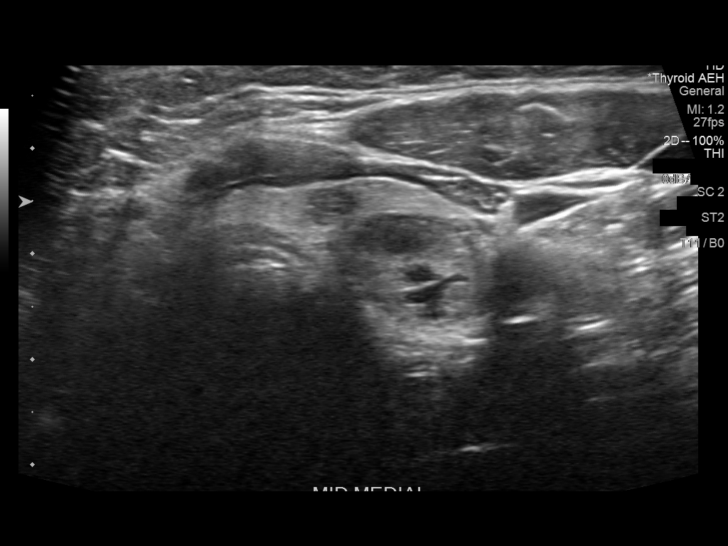
[im 47/52]
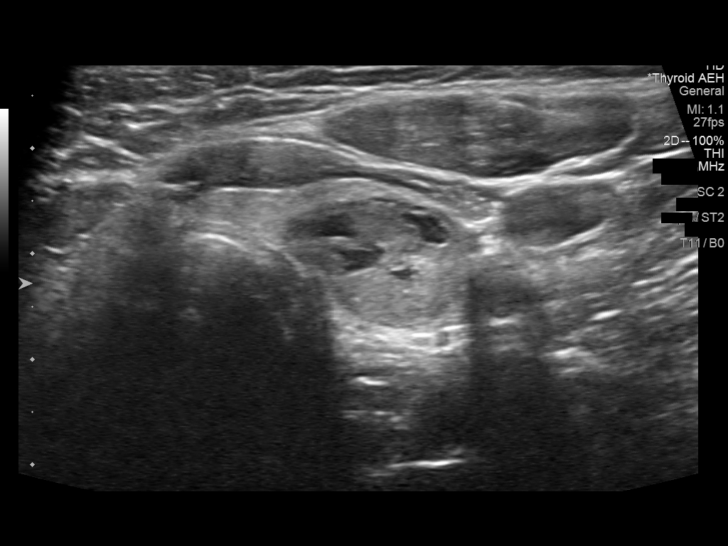
[im 52/52]
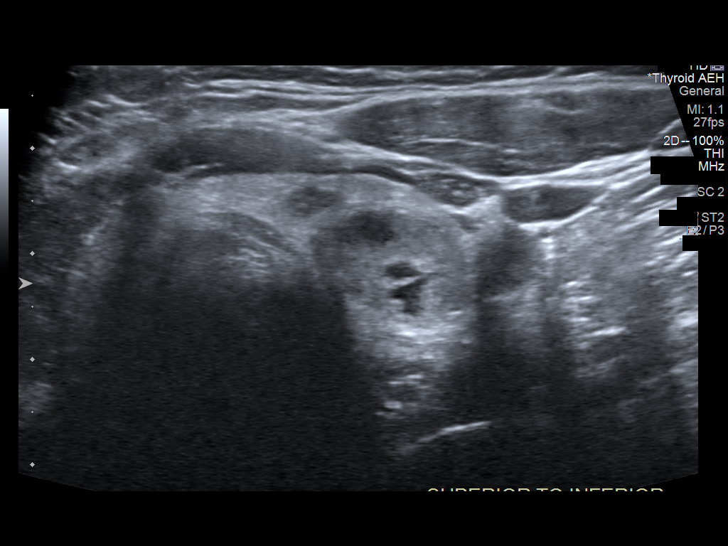

[13 of 25 positions shown; findings below may reference images not displayed]

FINDINGS: Parenchymal Echotexture: Mildly heterogeneous

Isthmus: 0.3 cm

Right lobe: 4.4 x 1.9 x 1.5 cm

Left lobe: 5.2 x 1.4 x 1.9 cm

_________________________________________________________

Estimated total number of nodules >/= 1 cm: 2

Number of spongiform nodules >/=  2 cm not described below (TR1): 0

Number of mixed cystic and solid nodules >/= 1.5 cm not described
below (TR2): 0

_________________________________________________________

Nodule # 1:

Prior biopsy: No

Location: Right; mid

Maximum size: 1.8 cm; Other 2 dimensions: 1.1 x 1.1 cm, previously,
1.9 x 1.0 x 1.1 cm

Composition: mixed cystic and solid (1)

Echogenicity: isoechoic (1)

ACR TI-RADS total points: 2.

ACR TI-RADS risk category:  TR2 (2 points).

Significant change in size (>/= 20% in two dimensions and minimal
increase of 2 mm): No

Change in features: No

Change in ACR TI-RADS risk category: No

ACR TI-RADS recommendations:

This nodule does NOT meet TI-RADS criteria for biopsy or dedicated
follow-up.

_________________________________________________________

Nodule # 3:

Prior biopsy: No

Location: Left; mid

Maximum size: 2.0 cm; Other 2 dimensions: 1.7 x 1.3 cm, previously,
2.0 x 1.4 x 1.9 cm

Composition: solid/almost completely solid (2)

Echogenicity: isoechoic (1)

Shape: not taller-than-wide (0)

Margins: smooth (0)

Echogenic foci: none (0)

ACR TI-RADS total points: 3.

ACR TI-RADS risk category:  TR3 (3 points).

Significant change in size (>/= 20% in two dimensions and minimal
increase of 2 mm): No

Change in features: No

Change in ACR TI-RADS risk category: No

ACR TI-RADS recommendations:

*Given size (>/= 1.5 - 2.4 cm) and appearance, a follow-up
ultrasound in 1 year should be considered based on TI-RADS criteria.

_________________________________________________________

Remaining subcentimeter thyroid nodules do not meet criteria for FNA
or surveillance.
IMPRESSION: Nodule 3 (TI-RADS 3) is not significantly changed in size compared
to ultrasound from [DATE]. This nodule meets criteria for annual
ultrasound follow-up until 5 years of stability is confirmed.

The above is in keeping with the ACR TI-RADS recommendations - [HOSPITAL] [0T];[DATE].

## 2021-05-03 ENCOUNTER — Other Ambulatory Visit: Payer: Self-pay

## 2021-05-03 ENCOUNTER — Ambulatory Visit
Admission: RE | Admit: 2021-05-03 | Discharge: 2021-05-03 | Disposition: A | Discharge status: Home / Self Care | Payer: BC Managed Care – PPO | Source: Ambulatory Visit | Attending: Family Medicine | Admitting: Family Medicine

## 2021-05-03 ENCOUNTER — Encounter: Payer: Self-pay | Admitting: Family Medicine

## 2021-05-03 DIAGNOSIS — M542 Cervicalgia: Secondary | ICD-10-CM

## 2021-05-03 IMAGING — MR MR CERVICAL SPINE W/O CM
5 series · 37 of 48 positions shown · non-contrast
Comparison: Plain film cervical spine [DATE].

CLINICAL DATA: Neck pain and swelling for several years. No recent
injury.

EXAM:
MRI CERVICAL SPINE WITHOUT CONTRAST
TECHNIQUE: Multiplanar, multisequence MR imaging of the cervical spine was
performed. No intravenous contrast was administered.

[Series 3: STIR · sagittal · 3.0mm · 0.82mm/px · 7 of 17 slices shown]
[im 1/17]
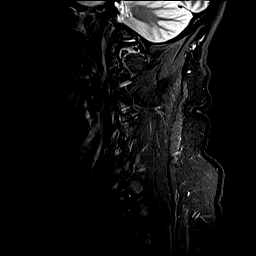
[im 3/17]
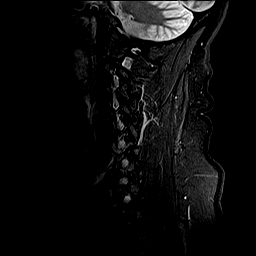
[im 6/17]
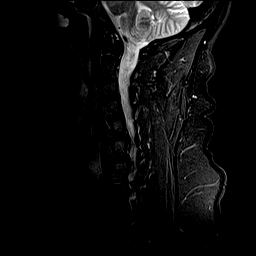
[im 9/17]
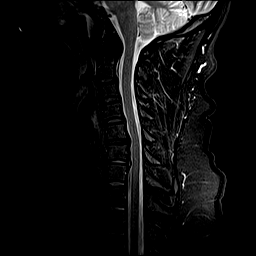
[im 11/17]
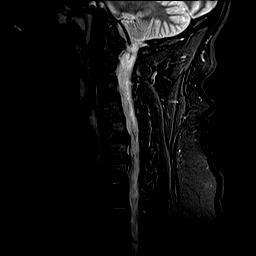
[im 14/17]
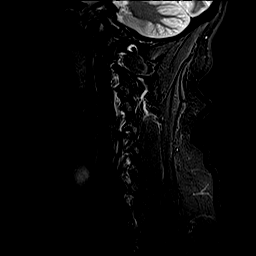
[im 17/17]
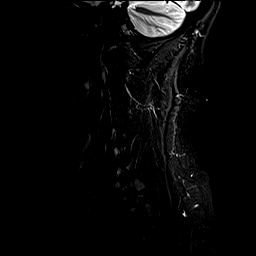

[Series 4: T1 · sagittal · 3.0mm · 0.82mm/px · 7 of 17 slices shown]
[im 1/17]
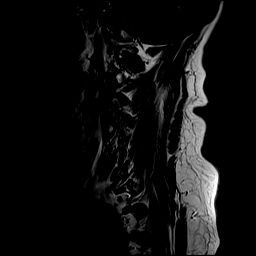
[im 3/17]
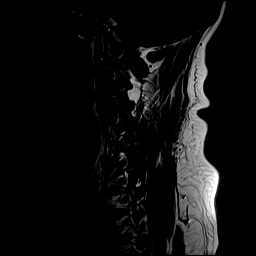
[im 6/17]
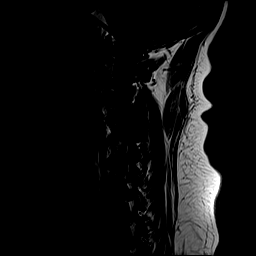
[im 9/17]
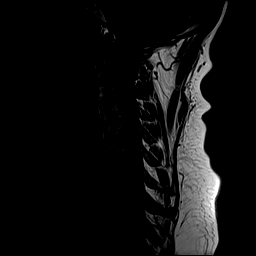
[im 11/17]
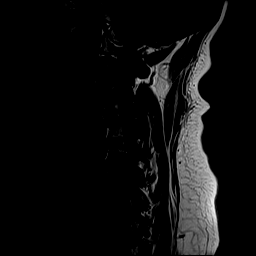
[im 14/17]
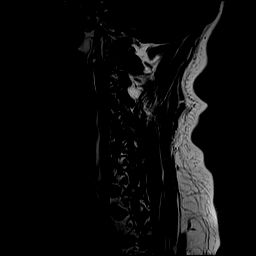
[im 17/17]
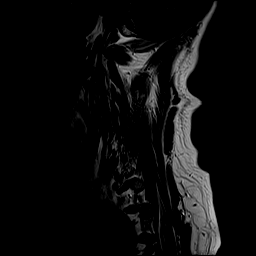

[Series 5: T2 · axial · 3.0mm · 0.70mm/px · z∈[-39,+61]mm · 9 of 28 slices shown (1 of 2)]
[im 1/28]
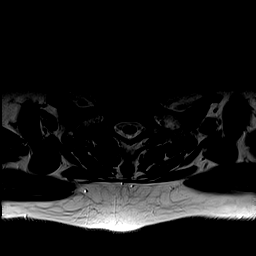
[im 5/28]
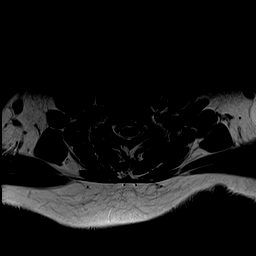
[im 10/28]
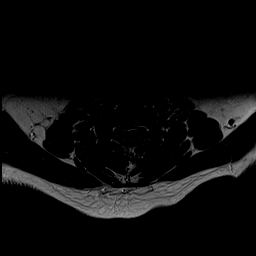
[im 12/28]
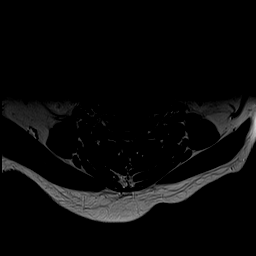
[im 14/28]
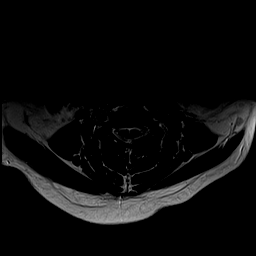
[im 16/28]
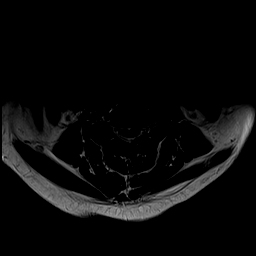
[im 19/28]
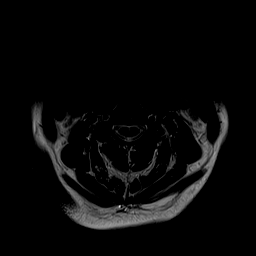
[im 23/28]
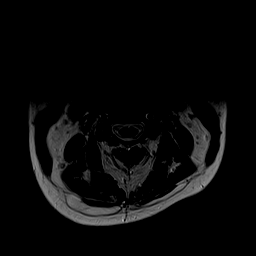
[im 28/28]
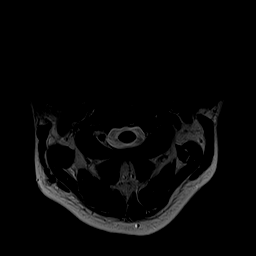

[Series 6: GRE · axial · 3.0mm · 0.47mm/px · z∈[-39,+28]mm · 6 of 28 slices shown]
[im 1/28]
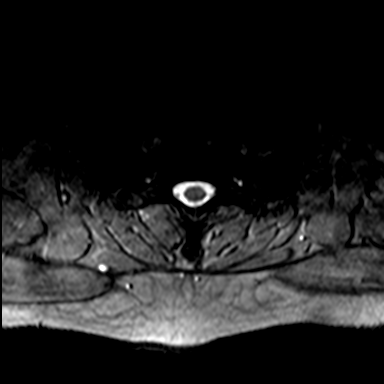
[im 5/28]
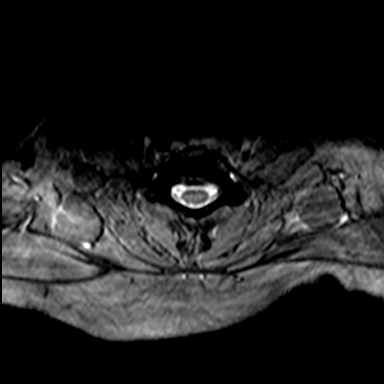
[im 10/28]
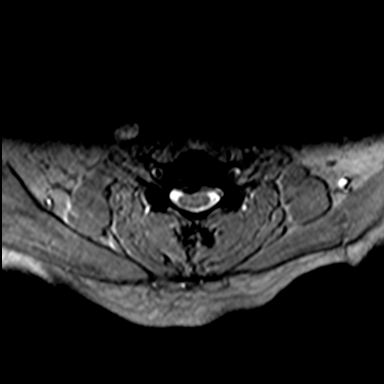
[im 12/28]
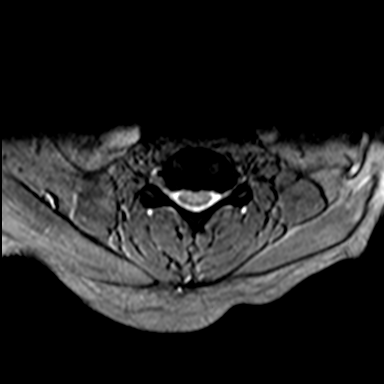
[im 16/28]
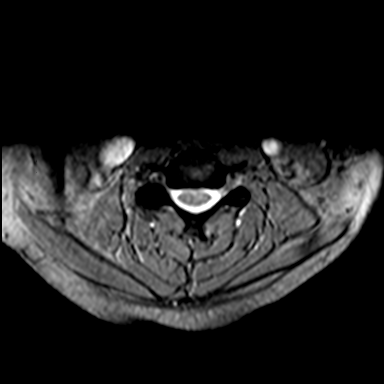
[im 19/28]
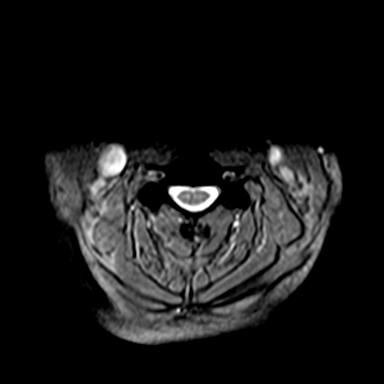

[Series 7: T2 · sagittal · 3.0mm · 0.41mm/px · 8 of 17 slices shown (2 of 2)]
[im 1/17]
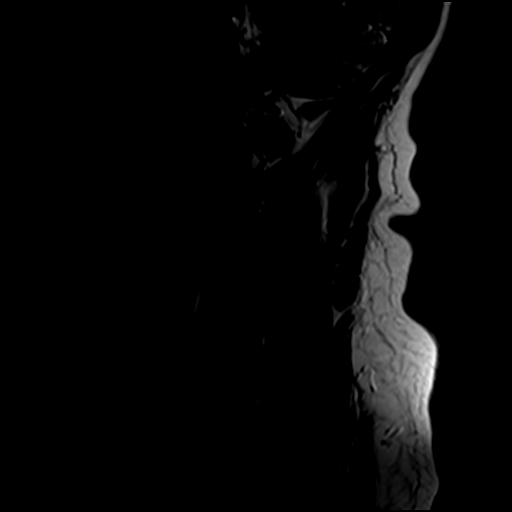
[im 3/17]
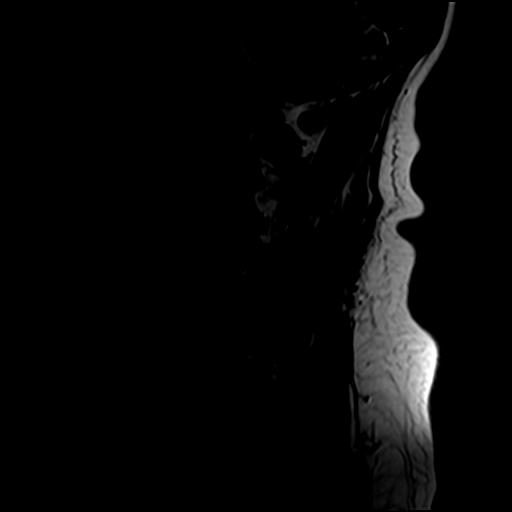
[im 5/17]
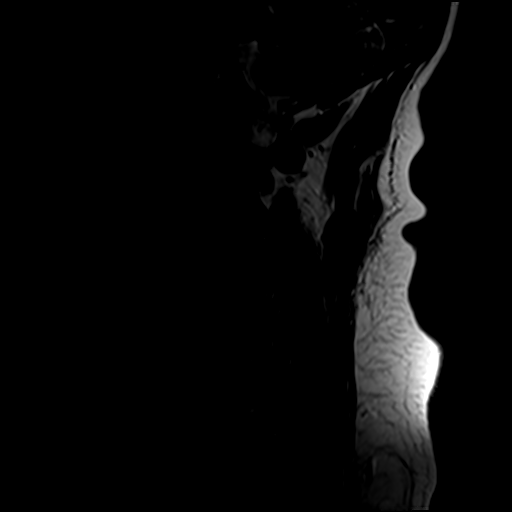
[im 7/17]
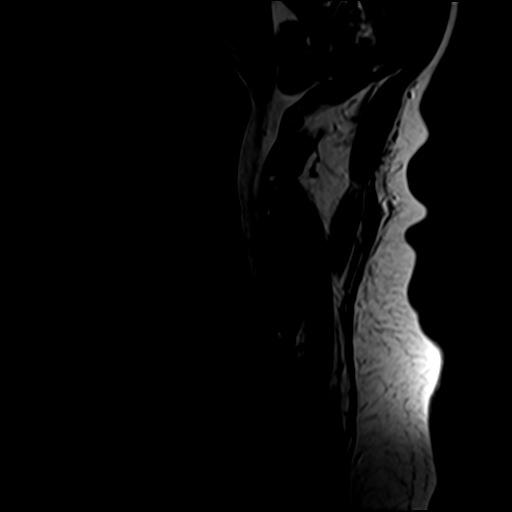
[im 10/17]
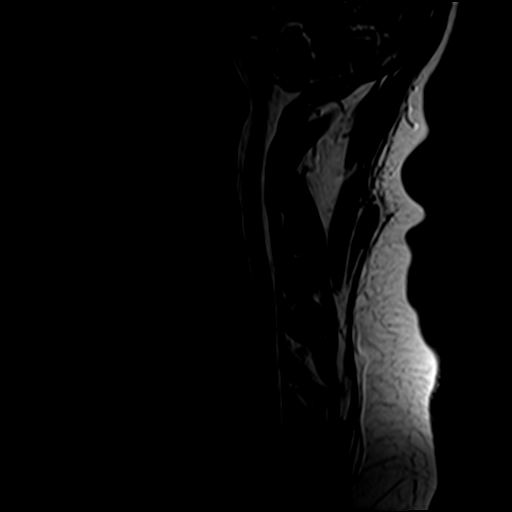
[im 12/17]
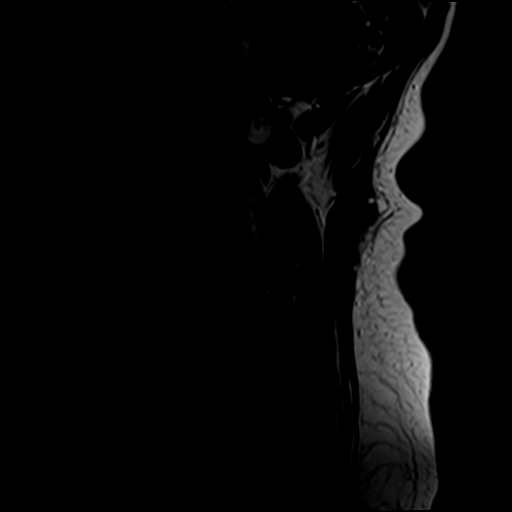
[im 14/17]
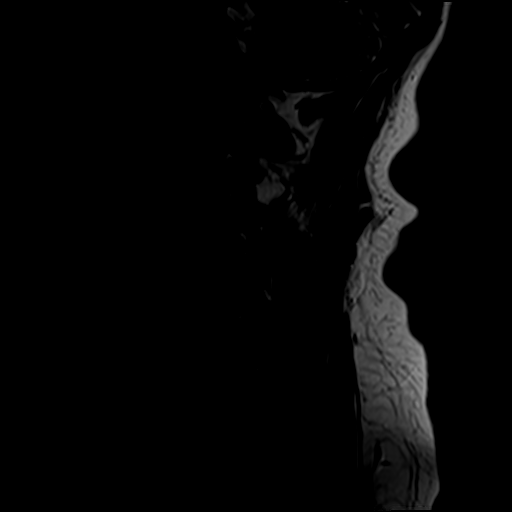
[im 17/17]
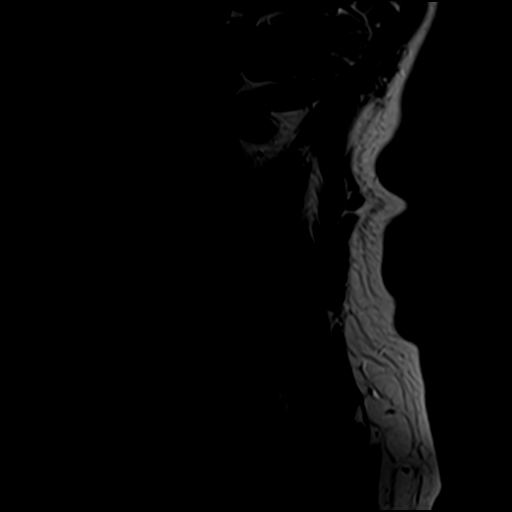

[37 of 48 positions shown; findings below may reference images not displayed]

FINDINGS: Alignment: Mild reversal of the normal cervical lordosis with
associated trace anterolisthesis C2 on C3, C3 on C4 and C4 on C5.

Vertebrae: No fracture, evidence of discitis, or bone lesion.

Cord: Normal signal throughout.

Posterior Fossa, vertebral arteries, paraspinal tissues: Negative.

Disc levels:

C2-3: Mild bilateral facet degenerative disease, more notable on the
left. Otherwise negative.

C3-4: Mild-to-moderate bilateral facet degenerative change and a
minimal disc bulge. No stenosis.

C4-5: Mild-to-moderate facet degenerative change is worse on the
right. Very shallow disc bulge and mild uncovertebral spurring on
the right. There is mild right foraminal narrowing. The central
canal and left foramen are widely patent.

C5-6: Disc osteophyte complex and bilateral uncovertebral spurring.
The ventral thecal sac is effaced. Mild right and moderate to
moderately severe left foraminal narrowing.

C6-7: Broad-based central protrusion with an annular fissure results
in effacement of the ventral thecal sac. The foramina are open.

C7-T1: Negative.
IMPRESSION: The ventral thecal sac is effaced at C5-6 by disc osteophyte
complex. There is mild right and moderate to moderately severe left
foraminal narrowing at this level.

Broad-based central protrusion at C6-7 effaces the ventral thecal
sac.

Mild right foraminal narrowing C4-5 mainly due to uncovertebral
spurring. Mild to moderate facet degenerative disease at C4-5 is
worse on the right.

Reversal of the normal cervical lordosis with trace anterolisthesis
from C2-3 to C4-5 as seen on prior plain films.

## 2021-05-06 ENCOUNTER — Encounter: Payer: Self-pay | Admitting: Family Medicine

## 2021-05-07 ENCOUNTER — Other Ambulatory Visit: Payer: Self-pay

## 2021-05-07 ENCOUNTER — Ambulatory Visit: Payer: Self-pay | Admitting: Neurology

## 2021-05-07 DIAGNOSIS — M5030 Other cervical disc degeneration, unspecified cervical region: Secondary | ICD-10-CM

## 2021-05-14 NOTE — Telephone Encounter (Signed)
Patient's Botox appt is 6/23. Received (1) 200 unit vial of Botox today from [facility].

## 2021-05-15 NOTE — Progress Notes (Addendum)
05/16/2021 ALL: She continues Botox. She is [redacted] weeks pregnant. We have discussed safety profile of Botox in pregnancy. Although no medication is entirely safe in pregnancy, recent data supports that Botox molecule does not cross placenta and is a safe consideration for management of intractable headaches. She is aware of risks and wishes to continue Botox therapy. I have advised that she discontinue Nurtec, sumatriptan, Compazine and cyclobenzaprine. She has called Nur[facility] to discuss safety of continued CGRP therapy for migraines. She feels that the only therapy that has helped manage migraines. They have advised she journal use of Nurtec. I will have her discuss with OB. Advised not to take at this time. She reports anaphylactic reaction to Tylenol. May consider triptan pending advise form OB. She verbalizes understanding of my recommendations and possible risks of using migraine prevention/abortion medicaitons in pregnancy.   02/14/2021 ALL: She continues Botox and sumatriptan/Nurtec. She was seen by Dr Loretta Plume in 11/2020 for second opinion. He recommended she consider switching Nurtec to Tyhee daily versus discontinuing Botox and Nurtec and start Vypeti q67m. She wishes to remain on current treatment plan. Usually has 1-2 migraines per month until week prior to and after Botox when she has 2-3 per week.   11/08/2020 ALL: She is doing well. She has about 4 migraines per month. Migraines worsen the week prior to Botox being due.   Consent Form Botulism Toxin Injection For Chronic Migraine   Reviewed orally with patient, additionally signature is on file:  Botulism toxin has been approved by the Federal drug administration for treatment of chronic migraine. Botulism toxin does not cure chronic migraine and it may not be effective in some patients.  The administration of botulism toxin is accomplished by injecting a small amount of toxin into the muscles of the neck and head. Dosage must be titrated for  each individual. Any benefits resulting from botulism toxin tend to wear off after 3 months with a repeat injection required if benefit is to be maintained. Injections are usually done every 3-4 months with maximum effect peak achieved by about 2 or 3 weeks. Botulism toxin is expensive and you should be sure of what costs you will incur resulting from the injection.  The side effects of botulism toxin use for chronic migraine may include:   -Transient, and usually mild, facial weakness with facial injections  -Transient, and usually mild, head or neck weakness with head/neck injections  -Reduction or loss of forehead facial animation due to forehead muscle weakness  -Eyelid drooping  -Dry eye  -Pain at the site of injection or bruising at the site of injection  -Double vision  -Potential unknown long term risks   Contraindications: You should not have Botox if you are pregnant, nursing, allergic to albumin, have an infection, skin condition, or muscle weakness at the site of the injection, or have myasthenia gravis, Lambert-Eaton syndrome, or ALS.  It is also possible that as with any injection, there may be an allergic reaction or no effect from the medication. Reduced effectiveness after repeated injections is sometimes seen and rarely infection at the injection site may occur. All care will be taken to prevent these side effects. If therapy is given over a long time, atrophy and wasting in the muscle injected may occur. Occasionally the patient's become refractory to treatment because they develop antibodies to the toxin. In this event, therapy needs to be modified.  I have read the above information and consent to the administration of botulism toxin.  BOTOX PROCEDURE NOTE FOR MIGRAINE HEADACHE  Contraindications and precautions discussed with patient(above). Aseptic procedure was observed and patient tolerated procedure. Procedure performed by [name], FNP-C.   The condition has  existed for more than 6 months, and pt does not have a diagnosis of ALS, Myasthenia Gravis or Lambert-Eaton Syndrome.  Risks and benefits of injections discussed and pt agrees to proceed with the procedure.  Written consent obtained  These injections are medically necessary. Pt  receives good benefits from these injections. These injections do not cause sedations or hallucinations which the oral therapies may cause.   Description of procedure:  The patient was placed in a sitting position. The standard protocol was used for Botox as follows, with 5 units of Botox injected at each site:  -Procerus muscle, midline injection  -Corrugator muscle, bilateral injection  -Frontalis muscle, bilateral injection, with 2 sites each side, medial injection was performed in the upper one third of the frontalis muscle, in the region vertical from the medial inferior edge of the superior orbital rim. The lateral injection was again in the upper one third of the forehead vertically above the lateral limbus of the cornea, 1.5 cm lateral to the medial injection site.  -Temporalis muscle injection, 4 sites, bilaterally. The first injection was 3 cm above the tragus of the ear, second injection site was 1.5 cm to 3 cm up from the first injection site in line with the tragus of the ear. The third injection site was 1.5-3 cm forward between the first 2 injection sites. The fourth injection site was 1.5 cm posterior to the second injection site. 5th site laterally in the temporalis  muscleat the level of the outer canthus.  -Occipitalis muscle injection, 3 sites, bilaterally. The first injection was done one half way between the occipital protuberance and the tip of the mastoid process behind the ear. The second injection site was done lateral and superior to the first, 1 fingerbreadth from the first injection. The third injection site was 1 fingerbreadth superiorly and medially from the first injection site.  -Cervical  paraspinal muscle injection, 2 sites, bilaterally. The first injection site was 1 cm from the midline of the cervical spine, 3 cm inferior to the lower border of the occipital protuberance. The second injection site was 1.5 cm superiorly and laterally to the first injection site.  -Trapezius muscle injection was performed at 3 sites, bilaterally. The first injection site was in the upper trapezius muscle halfway between the inflection point of the neck, and the acromion. The second injection site was one half way between the acromion and the first injection site. The third injection was done between the first injection site and the inflection point of the neck.   Will return for repeat injection in 3 months.   A total of 200 units of Botox was prepared, 155 units of Botox was injected as documented above, any Botox not injected was wasted. The patient tolerated the procedure well, there were no complications of the above procedure.  Above plan discussed with Dr Jaynee Eagles.    agree with assessment and plan as stated.     Sarina Ill, MD [facility]

## 2021-05-16 ENCOUNTER — Ambulatory Visit: Payer: BC Managed Care – PPO | Admitting: Family Medicine

## 2021-05-16 DIAGNOSIS — G43709 Chronic migraine without aura, not intractable, without status migrainosus: Secondary | ICD-10-CM

## 2021-05-16 NOTE — Progress Notes (Signed)
Botox- 200 units x 1 vial Lot: [id number] Expiration: 11/2023 NDC: 7371-0626-94  Bacteriostatic 0.9% Sodium Chloride- 101mL total Lot: [id number] Expiration: 04/24/2022 NDC: 0350-0938-18  Dx: E99.371 S/P

## 2021-05-22 NOTE — Progress Notes (Signed)
[place] Phone: [phone]  Subjective:   Debbie [name], LAT, ATC acting as a scribe for Hulan Saas, DO  I'm seeing this patient by the request  of:  Leeroy Cha, MD  CC: R shoulder and neck pain  IRS:[id number]  Debbie [name] is a 40 y.o. female coming in with complaint of R shoulder and neck pain. OMT 04/16/2021. Patient states she is able to flex shoulder, but is still having pain IR/ER. Pt reports neck is still painful, bilat. No numbness/tingling noted. Pt has been compliant w/ HEP taught at her prior appointment.  Medications patient has been prescribed: Gabapentin          Reviewed prior external information including notes and imaging from previsou exam, outside providers and external EMR if available.   As well as notes that were available from care everywhere and other healthcare systems.  Past medical history, social, surgical and family history all reviewed in electronic medical record.  No pertanent information unless stated regarding to the chief complaint.   Past Medical History:  Diagnosis Date   Anxiety    self reported   Depression    self reported   Maxillary sinus cyst    left   Migraine    Multiple thyroid nodules    3; 1.9 cm and 2 cm, 0.6 cm, will see general  surgeon   No pertinent past medical history     Allergies  Allergen Reactions   Tylenol [Acetaminophen] Anaphylaxis   Aspirin Other (See Comments)    Unknown childhood rxn   Benadryl [Diphenhydramine Hcl] Itching   Buspirone     Other reaction(s): migraines   Citalopram Hydrobromide     Other reaction(s): agitation and dizziness   Dilaudid [Hydromorphone Hcl] Itching    Pill only   Effexor Xr [Venlafaxine]     Other reaction(s): More depressed (11/2017-neurology)   Penicillins     hives   Prednisone     Swelling, hives   Sulfamethoxazole-Trimethoprim     Other reaction(s): swelling (2017)    Zoloft [Sertraline]     Other reaction(s): nausea and swelling   Latex Rash     Review of Systems:  No headache, visual changes, nausea, vomiting, diarrhea, constipation, dizziness, abdominal pain, skin rash, fevers, chills, night sweats, weight loss, swollen lymph nodes, body aches, joint swelling, chest pain, shortness of breath, mood changes. POSITIVE muscle aches  Objective  Blood pressure 122/82, pulse 94, temperature 99 F (37.2 C), height 5' 3.5" (1.613 m), weight 229 lb 9.6 oz (104.1 kg), unknown if currently breastfeeding.   General: No apparent distress alert and oriented x3 mood and affect normal, dressed appropriately.  HEENT: Pupils equal, extraocular movements intact  Respiratory: Patient's speak in full sentences and does not appear short of breath  Cardiovascular: No lower extremity edema, non tender, no erythema  Gait normal with good balance and coordination.  MSK:  Non tender with full range of motion and good stability and symmetric strength and tone of shoulders, elbows, wrist, hip, knee and ankles bilaterally.  Back low back exam very mild tightness noted.  Tenderness to palpation of the paraspinal musculature of the lumbar spine.  Patient has actually more tightness though also noted in the parascapular region bilaterally.  No midline tenderness.  Neck exam has limited range of motion secondary to voluntary guarding.  Right shoulder exam has a previous scar noted from an anterior approach surgery.  Patient also positive  impingement noted.  Limited musculoskeletal ultrasound was performed and interpreted by Lyndal Pulley  Limited ultrasound of patient's right shoulder shows the patient has some very mild hypoechoic changes surrounding the supraspinatus that is more consistent with previous surgical intervention and questionable small amount of subacromial bursitis.  Mild to moderate arthritic changes of the acromioclavicular joint.   Osteopathic findings  C2 flexed  rotated and side bent right C6 flexed rotated and side bent left T3 extended rotated and side bent right inhaled rib T9 extended rotated and side bent left L2 flexed rotated and side bent right Sacrum right on right     Assessment and Plan:  Right shoulder pain Patient has had surgery on the right shoulder previously.  This was in 2005.  Does seem to radiate potential transposition of the bicep tendon.  Mild inflammation but not likely enough to cause some of the discomfort and pain at this time.  Discussed with patient about icing regimen and home exercises.  Follow-up with me again in 6 to 8 weeks.  Degenerative disc disease, cervical Chronic problem with mild exacerbation.  Discussed the gabapentin which patient has been taking intermittently.  Responding fairly well though to osteopathic manipulation.  Discussed posture and ergonomics.  Worsening pain may need to consider the possibility of advanced imaging as well as epidurals.  Follow-up again in 6 to 8 weeks.   Nonallopathic problems  Decision today to treat with OMT was based on Physical Exam  After verbal consent patient was treated with HVLA, ME, FPR techniques in cervical, rib, thoracic, lumbar, and sacral  areas  Patient tolerated the procedure well with improvement in symptoms  Patient given exercises, stretches and lifestyle modifications  See medications in patient instructions if given  Patient will follow up in 4-8 weeks      The above documentation has been reviewed and is accurate and complete Lyndal Pulley, DO       Note: This dictation was prepared with Dragon dictation along with smaller phrase technology. Any transcriptional errors that result from this process are unintentional.

## 2021-05-28 ENCOUNTER — Ambulatory Visit: Payer: Self-pay

## 2021-05-28 ENCOUNTER — Ambulatory Visit: Payer: BC Managed Care – PPO | Admitting: Family Medicine

## 2021-05-28 ENCOUNTER — Other Ambulatory Visit: Payer: Self-pay

## 2021-05-28 ENCOUNTER — Encounter: Payer: Self-pay | Admitting: Family Medicine

## 2021-05-28 VITALS — BP 122/82 | HR 94 | Temp 99.0°F | Ht 63.5 in | Wt 229.6 lb

## 2021-05-28 DIAGNOSIS — G8929 Other chronic pain: Secondary | ICD-10-CM

## 2021-05-28 DIAGNOSIS — M9904 Segmental and somatic dysfunction of sacral region: Secondary | ICD-10-CM

## 2021-05-28 DIAGNOSIS — M25511 Pain in right shoulder: Secondary | ICD-10-CM

## 2021-05-28 DIAGNOSIS — M9901 Segmental and somatic dysfunction of cervical region: Secondary | ICD-10-CM | POA: Diagnosis not present

## 2021-05-28 DIAGNOSIS — M5030 Other cervical disc degeneration, unspecified cervical region: Secondary | ICD-10-CM

## 2021-05-28 DIAGNOSIS — M9902 Segmental and somatic dysfunction of thoracic region: Secondary | ICD-10-CM

## 2021-05-28 DIAGNOSIS — M9908 Segmental and somatic dysfunction of rib cage: Secondary | ICD-10-CM

## 2021-05-28 DIAGNOSIS — M9903 Segmental and somatic dysfunction of lumbar region: Secondary | ICD-10-CM | POA: Diagnosis not present

## 2021-05-28 NOTE — Patient Instructions (Addendum)
Good to see you today!  Hold off on OMT due to pregnancy. CONGRATS!!  Your shoulder is looking good cont exercises.  See me again in 5-6 weeks.

## 2021-05-28 NOTE — Assessment & Plan Note (Signed)
Patient has had surgery on the right shoulder previously.  This was in 2005.  Does seem to radiate potential transposition of the bicep tendon.  Mild inflammation but not likely enough to cause some of the discomfort and pain at this time.  Discussed with patient about icing regimen and home exercises.  Follow-up with me again in 6 to 8 weeks.

## 2021-05-28 NOTE — Assessment & Plan Note (Signed)
Chronic problem with mild exacerbation.  Discussed the gabapentin which patient has been taking intermittently.  Responding fairly well though to osteopathic manipulation.  Discussed posture and ergonomics.  Worsening pain may need to consider the possibility of advanced imaging as well as epidurals.  Follow-up again in 6 to 8 weeks.

## 2021-05-30 ENCOUNTER — Ambulatory Visit: Payer: BC Managed Care – PPO | Admitting: Family Medicine

## 2021-06-03 ENCOUNTER — Other Ambulatory Visit: Payer: Self-pay | Admitting: Obstetrics and Gynecology

## 2021-06-03 DIAGNOSIS — Z363 Encounter for antenatal screening for malformations: Secondary | ICD-10-CM

## 2021-06-11 DIAGNOSIS — E042 Nontoxic multinodular goiter: Secondary | ICD-10-CM | POA: Insufficient documentation

## 2021-06-11 DIAGNOSIS — H8190 Unspecified disorder of vestibular function, unspecified ear: Secondary | ICD-10-CM | POA: Insufficient documentation

## 2021-06-19 ENCOUNTER — Other Ambulatory Visit: Payer: Self-pay

## 2021-06-19 ENCOUNTER — Ambulatory Visit (INDEPENDENT_AMBULATORY_CARE_PROVIDER_SITE_OTHER): Payer: BC Managed Care – PPO | Admitting: Psychiatry

## 2021-06-19 ENCOUNTER — Encounter (HOSPITAL_COMMUNITY): Payer: Self-pay | Admitting: Psychiatry

## 2021-06-19 VITALS — BP 116/61 | HR 78 | Ht 63.0 in | Wt 232.0 lb

## 2021-06-19 DIAGNOSIS — F411 Generalized anxiety disorder: Secondary | ICD-10-CM

## 2021-06-19 DIAGNOSIS — F320 Major depressive disorder, single episode, mild: Secondary | ICD-10-CM | POA: Diagnosis not present

## 2021-06-19 DIAGNOSIS — F32A Depression, unspecified: Secondary | ICD-10-CM

## 2021-06-19 NOTE — Progress Notes (Signed)
BH MD/PA/NP OP Progress Note  06/19/2021 12:09 PM Debbie [name]  MRN:  [id number]  Chief Complaint:  Chief Complaint   Medication Management   "  I am doing pretty well"  HPI: 40 year old female seen today for follow-up psychiatric evaluation.  She is a former patient of Dr. Jean Rosenthal is being transferred to writer for medication management.  She has a psychiatric history of ADHD, depression, and mood disorder.  Currently she is managed on Cymbalta however she notes that she discontinued it after she found out that she was pregnant.  Today she is pleasant, cooperative, engaged in conversation, and maintains eye contact.  She informed provider that she is [redacted] weeks pregnant and has discontinued her antidepressant.  She notes that she dislikes Cymbalta because it made her feel heavy.  She notes that she suffers from other chronic medical comorbidities and cannot deal with having another symptom.  She notes that she has symptoms of hypo-/hyperthyroidism, nodules on her thyroid, has right shoulder pain, bursitis, and left hip pain.  She also notes that she suffers from chronic migraines.  She informed Probation officer that she received a Botox injection to help with these migraines and notes that it has been somewhat effective.  Patient notes despite life stressors her anxiety and depression continues to be minimal.  Provider conducted a GAD-7 and patient scored an 11.  Provider also conducted a PHQ-9 and patient scored an 11.  She notes that sleeps 4 to 5 hours nightly.  She notes that her appetite fluctuates at times.  Today she denies SI/HI/VAH, mania, or paranoia.   Patient notes that she works at [employer].  She notes that she finds enjoyment in her job.  She notes that it is convenient that she is able to work from home.  At this time no medications restarted.  Patient notes that she is allergic to several antidepressants and is skeptical about taking medications in  the future.  Provider informed her that GeneSight testing may be more appropriate when considering to restart medications.  She endorsed understanding and agreed.  No other concerns noted at this time.  Visit Diagnosis:    ICD-10-CM   1. Mild depression ([place])  F32.0     2. Generalized anxiety disorder  F41.1       Past Psychiatric History: Depression, TBI, ADHD  Past Medical History:  Past Medical History:  Diagnosis Date   Anxiety    self reported   Depression    self reported   Maxillary sinus cyst    left   Migraine    Multiple thyroid nodules    3; 1.9 cm and 2 cm, 0.6 cm, will see general  surgeon   No pertinent past medical history     Past Surgical History:  Procedure Laterality Date   CESAREAN SECTION  2013   laparscopy     SHOULDER CAPSULORRHAPHY      Family Psychiatric History: Sister-migraines  Family History:  Family History  Problem Relation Age of Onset   Heart disease Maternal Grandfather    Cerebral aneurysm Sister    Migraines Sister    Seizures Sister    Heart disease Other        mother's side   High Cholesterol Other        mother's side     Social History:  Social History   Socioeconomic History   Marital status: Married    Spouse name: Not on file  Number of children: 1   Years of education: Not on file   Highest education level: Bachelor's degree (e.g., BA, AB, BS)  Occupational History   Not on file  Tobacco Use   Smoking status: Never   Smokeless tobacco: Never  Vaping Use   Vaping Use: Never used  Substance and Sexual Activity   Alcohol use: No   Drug use: No   Sexual activity: Yes    Birth control/protection: None  Other Topics Concern   Not on file  Social History Narrative   Lives at home with her son & his father   Right handed   Drinks 1 cup of caffeine daily   Social Determinants of Health   Financial Resource Strain: Not on file  Food Insecurity: Not on file  Transportation Needs: Not on file  Physical  Activity: Not on file  Stress: Not on file  Social Connections: Not on file    Allergies:  Allergies  Allergen Reactions   Tylenol [Acetaminophen] Anaphylaxis   Aspirin Other (See Comments)    Unknown childhood rxn   Benadryl [Diphenhydramine Hcl] Itching   Buspirone     Other reaction(s): migraines   Citalopram Hydrobromide     Other reaction(s): agitation and dizziness   Dilaudid [Hydromorphone Hcl] Itching    Pill only   Effexor Xr [Venlafaxine]     Other reaction(s): More depressed (11/2017-neurology)   Penicillins     hives   Prednisone     Swelling, hives   Sulfamethoxazole-Trimethoprim     Other reaction(s): swelling (2017)   Zoloft [Sertraline]     Other reaction(s): nausea and swelling   Latex Rash    Metabolic Disorder Labs: No results found for: HGBA1C, MPG No results found for: PROLACTIN No results found for: CHOL, TRIG, HDL, CHOLHDL, VLDL, LDLCALC No results found for: TSH  Therapeutic Level Labs: No results found for: LITHIUM No results found for: VALPROATE No components found for:  CBMZ  Current Medications: Current Outpatient Medications  Medication Sig Dispense Refill   Botulinum Toxin Type A (BOTOX) 200 units SOLR Inject 155 Units as directed every 3 (three) months. Inject 155units to head and neck intramuscularly every 3 months. 1 each 3   gabapentin (NEURONTIN) 100 MG capsule Take 2 capsules (200 mg total) by mouth at bedtime. (Patient taking differently: Take 200 mg by mouth at bedtime as needed (joint pain).) 180 capsule 0   loratadine (CLARITIN) 10 MG tablet Take 10 mg by mouth daily as needed for allergies.     meclizine (ANTIVERT) 25 MG tablet Take 1 tablet (25 mg total) by mouth 3 (three) times daily as needed for dizziness. 30 tablet 0   ondansetron (ZOFRAN ODT) 4 MG disintegrating tablet Take 1 tablet (4 mg total) by mouth every 8 (eight) hours as needed for nausea. 10 tablet 0   No current facility-administered medications for this visit.      Musculoskeletal: Strength & Muscle Tone: within normal limits Gait & Station: normal Patient leans: N/A  Psychiatric Specialty Exam: Review of Systems  Blood pressure 116/61, pulse 78, height '5\' 3"'$  (1.6 m), weight 232 lb (105.2 kg), unknown if currently breastfeeding.Body mass index is 41.1 kg/m.  General Appearance: Well Groomed  Eye Contact:  Good  Speech:  Clear and Coherent and Normal Rate  Volume:  Normal  Mood:  Euthymic and Notes that she is able to cope with her anxiety and depression  Affect:  Appropriate and Congruent  Thought Process:  Coherent, Goal Directed,  and Linear  Orientation:  Full (Time, Place, and Person)  Thought Content: WDL and Logical   Suicidal Thoughts:  No  Homicidal Thoughts:  No  Memory:  Immediate;   Good Recent;   Good Remote;   Good  Judgement:  Good  Insight:  Good  Psychomotor Activity:  Normal  Concentration:  Concentration: Good and Attention Span: Good  Recall:  Good  Fund of Knowledge: Good  Language: Good  Akathisia:  No  Handed:  Right  AIMS (if indicated): not done  Assets:  Communication Skills Desire for Improvement Financial Resources/Insurance Housing Intimacy Social Support  ADL's:  Intact  Cognition: WNL  Sleep:  Good   Screenings: GAD-7    Flowsheet Row Clinical Support from 06/19/2021 in [facility]  Total GAD-7 Score 11      PHQ2-9    Flowsheet Row Clinical Support from 06/19/2021 in [facility]  PHQ-2 Total Score 2  PHQ-9 Total Score 11      [place] ED from 02/24/2021 in [facility] No Risk        Assessment and Plan: Patient endorses symptoms of anxiety and depression due to medical comorbidities and life stressors.  She notes however that she is able to cope with it. At this time no medications restarted to patient's pregnancy.  Patient notes that she is allergic to  several antidepressants and is skeptical about taking medications in the future.  Provider informed her that GeneSight testing may be more appropriate when considering to restart medications.  She endorsed understanding and agreed  1. Mild depression ([place])   2. Generalized anxiety disorder  Follow-up as needed   Salley Slaughter, NP 06/19/2021, 12:09 PM

## 2021-07-08 ENCOUNTER — Telehealth: Payer: Self-pay | Admitting: Family Medicine

## 2021-07-08 NOTE — Telephone Encounter (Signed)
Patient's next Botox appointment is 10/3. Botox PA with [employer] expired 06/11/21 (PA reference #[id number]). I need to complete new PA for next visit. Patient is currently pregnant, last injection was with Amy.

## 2021-07-09 NOTE — Telephone Encounter (Signed)
Faxed [employer] PA form to plan with notes. Requesting 200 units of Botox for Dx G43.709. PA is under Amy, patient will continue to use [place].

## 2021-07-09 NOTE — Progress Notes (Signed)
[name] [place] Phone: [phone] Subjective:   Debbie [name], am serving as a scribe for Dr. Hulan Saas.  I'm seeing this patient by the request  of:  Debbie Cha, MD  CC: low back pain f/u  [id number]  Debbie [name] is a 40 y.o. female coming in with complaint of back and neck pain. OMT 05/28/2021.  Patient states that she is hurting pretty bad. Patient states that two days ago her back had a spasm and she has been having to ambulate with a cane as the L leg feels like it will give way on her. Patient pain is located to the Left lower back and will radiate down the L leg with tingling and numbness.  Patient is also concerned about her neck pain.  Continues to have some radicular symptoms.  Patient did have an MRI 2022 showing the patient did have a broad-based central disc protrusion at C6-C7 with ventral thecal sac impingement.  Patient also has multiple levels of left foraminal narrowing especially at C5-C6.  Medications patient has been prescribed: Gabapentin  Taking: Gabapentin            Past Medical History:  Diagnosis Date   Anxiety    self reported   Depression    self reported   Maxillary sinus cyst    left   Migraine    Multiple thyroid nodules    3; 1.9 cm and 2 cm, 0.6 cm, will see general  surgeon   No pertinent past medical history     Allergies  Allergen Reactions   Tylenol [Acetaminophen] Anaphylaxis   Aspirin Other (See Comments)    Unknown childhood rxn   Benadryl [Diphenhydramine Hcl] Itching   Buspirone     Other reaction(s): migraines   Citalopram Hydrobromide     Other reaction(s): agitation and dizziness   Dilaudid [Hydromorphone Hcl] Itching    Pill only   Effexor Xr [Venlafaxine]     Other reaction(s): More depressed (11/2017-neurology)   Penicillins     hives   Prednisone     Swelling, hives   Sulfamethoxazole-Trimethoprim     Other reaction(s):  swelling (2017)   Zoloft [Sertraline]     Other reaction(s): nausea and swelling   Latex Rash     Review of Systems:  No headache, visual changes, nausea, vomiting, diarrhea, constipation, dizziness, abdominal pain, skin rash, fevers, chills, night sweats, weight loss, swollen lymph nodes, body aches, joint swelling, chest pain, shortness of breath, mood changes. POSITIVE muscle aches  Objective  Blood pressure 126/82, pulse (!) 102, height '5\' 3"'$  (1.6 m), weight 234 lb (106.1 kg), SpO2 99 %, unknown if currently breastfeeding.   General: No apparent distress alert and oriented x3 mood and affect normal, dressed appropriately.  HEENT: Pupils equal, extraocular movements intact  Respiratory: Patient's speak in full sentences and does not appear short of breath  Cardiovascular: No lower extremity edema, non tender, no erythema  Gait normal with good balance and coordination.  Patient neck exam still has some limited range of motion patient does have still mild positive Spurling's noted on the left side. Low back exam does have some mild loss of lordosis.  No significant tightness with straight leg test on the left compared to the right.  Mild tightness with FABER test on the left side 2.  Increasing discomfort and pain noted over the left sacroiliac joint.  Patient's abdominal exam shows the patient is gravid.  Patient does have what appears to be a potentially a periumbilical hernia noted that is superior to the umbilicus.  Mildly tender but is able to be pushed back further without any significant discomfort.  Osteopathic findings  C2 flexed rotated and side bent right C6 flexed rotated and side bent left T3 extended rotated and side bent right inhaled rib T8 extended rotated and side bent left L2 flexed rotated and side bent right Sacrum right on right      Assessment and Plan:  Degenerative disc disease, cervical Patient has known degenerative disc disease.  Patient still is  having neck pain.  Patient has asked for referral to neurosurgery to discuss.  Will refer her but made it very clear that with her being pregnant.  No significant weakness of the upper extremities and I do not feel that surgical intervention is likely necessary now but patient can talk to general surgery to discuss further.  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  Patient does have some pelvic floor dysfunction that could be contributing to some of the discomfort and pain as well.  We discussed potentially advanced imaging but with patient actually being [redacted] weeks pregnant so I would hold at this moment.  Attempted osteopathic manipulation with some mild resolution of pain as well.  With patient having an allergy to Tylenol patient is taking anti-inflammatories per the recommendation of her OB/GYN.  Pelvic floor dysfunction Pelvic floor dysfunction noted.  We will continue to monitor.  Patient is can work with a pelvic floor specialist physical therapist that I think will be helpful.  Thyroid mass Seen by general surgery and is continue to be followed up.  Umbilical hernia Patient has what appears to be a very small umbilical hernia.  Seems to be reproducible.  Discussed with patient about the pregnancy but she should discuss with her OB/GYN about the possibility of any type of intervention.  Discussed with patient know if anything such as nausea or vomiting or increased abdominal pain to seek medical attention immediately.  Patient is in agreement with the plan.   Nonallopathic problems  Decision today to treat with OMT was based on Physical Exam  After verbal consent patient was treated with  ME, FPR techniques in cervical, rib, thoracic, lumbar, and sacral  areas avoided HVLA due to severity of pain today as well as patient's neck findings.  Patient tolerated the procedure well with improvement in symptoms  Patient  given exercises, stretches and lifestyle modifications  See medications in patient instructions if given  Patient will follow up in 4-8 weeks      The above documentation has been reviewed and is accurate and complete Lyndal Pulley, DO        Note: This dictation was prepared with Dragon dictation along with smaller phrase technology. Any transcriptional errors that result from this process are unintentional.

## 2021-07-10 ENCOUNTER — Ambulatory Visit (INDEPENDENT_AMBULATORY_CARE_PROVIDER_SITE_OTHER): Payer: BC Managed Care – PPO | Admitting: Family Medicine

## 2021-07-10 ENCOUNTER — Other Ambulatory Visit: Payer: Self-pay

## 2021-07-10 VITALS — BP 126/82 | HR 102 | Ht 63.0 in | Wt 234.0 lb

## 2021-07-10 DIAGNOSIS — M9904 Segmental and somatic dysfunction of sacral region: Secondary | ICD-10-CM | POA: Diagnosis not present

## 2021-07-10 DIAGNOSIS — M9903 Segmental and somatic dysfunction of lumbar region: Secondary | ICD-10-CM

## 2021-07-10 DIAGNOSIS — M9901 Segmental and somatic dysfunction of cervical region: Secondary | ICD-10-CM | POA: Diagnosis not present

## 2021-07-10 DIAGNOSIS — M9902 Segmental and somatic dysfunction of thoracic region: Secondary | ICD-10-CM

## 2021-07-10 DIAGNOSIS — E079 Disorder of thyroid, unspecified: Secondary | ICD-10-CM

## 2021-07-10 DIAGNOSIS — M542 Cervicalgia: Secondary | ICD-10-CM

## 2021-07-10 DIAGNOSIS — M5030 Other cervical disc degeneration, unspecified cervical region: Secondary | ICD-10-CM | POA: Diagnosis not present

## 2021-07-10 DIAGNOSIS — M9908 Segmental and somatic dysfunction of rib cage: Secondary | ICD-10-CM

## 2021-07-10 DIAGNOSIS — M6289 Other specified disorders of muscle: Secondary | ICD-10-CM

## 2021-07-10 DIAGNOSIS — M5136 Other intervertebral disc degeneration, lumbar region: Secondary | ICD-10-CM

## 2021-07-10 DIAGNOSIS — K429 Umbilical hernia without obstruction or gangrene: Secondary | ICD-10-CM

## 2021-07-10 NOTE — Patient Instructions (Addendum)
Good to see you  Watch the stomach pain and the bulge chance it is a hernia Any nausea vomiting or pain seek medical attention immediately  Do go to pelvic floor therapy I think it will be helpful Refer to Dr. Vertell Limber for neck  See me again 4 weeks

## 2021-07-10 NOTE — Assessment & Plan Note (Signed)
Patient has what appears to be a very small umbilical hernia.  Seems to be reproducible.  Discussed with patient about the pregnancy but she should discuss with her OB/GYN about the possibility of any type of intervention.  Discussed with patient know if anything such as nausea or vomiting or increased abdominal pain to seek medical attention immediately.  Patient is in agreement with the plan.

## 2021-07-10 NOTE — Assessment & Plan Note (Signed)
Seen by general surgery and is continue to be followed up.

## 2021-07-10 NOTE — Assessment & Plan Note (Signed)
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  Patient does have some pelvic floor dysfunction that could be contributing to some of the discomfort and pain as well.  We discussed potentially advanced imaging but with patient actually being [redacted] weeks pregnant so I would hold at this moment.  Attempted osteopathic manipulation with some mild resolution of pain as well.  With patient having an allergy to Tylenol patient is taking anti-inflammatories per the recommendation of her OB/GYN.

## 2021-07-10 NOTE — Assessment & Plan Note (Signed)
Patient has known degenerative disc disease.  Patient still is having neck pain.  Patient has asked for referral to neurosurgery to discuss.  Will refer her but made it very clear that with her being pregnant.  No significant weakness of the upper extremities and I do not feel that surgical intervention is likely necessary now but patient can talk to general surgery to discuss further.

## 2021-07-10 NOTE — Assessment & Plan Note (Signed)
Pelvic floor dysfunction noted.  We will continue to monitor.  Patient is can work with a pelvic floor specialist physical therapist that I think will be helpful.

## 2021-07-11 ENCOUNTER — Other Ambulatory Visit: Payer: Self-pay

## 2021-07-11 ENCOUNTER — Emergency Department (HOSPITAL_COMMUNITY): Payer: BC Managed Care – PPO

## 2021-07-11 ENCOUNTER — Encounter (HOSPITAL_COMMUNITY): Payer: Self-pay | Admitting: Emergency Medicine

## 2021-07-11 ENCOUNTER — Emergency Department (HOSPITAL_COMMUNITY)
Admission: EM | Admit: 2021-07-11 | Discharge: 2021-07-12 | Disposition: A | Discharge status: Home / Self Care | Payer: BC Managed Care – PPO | Attending: Emergency Medicine | Admitting: Emergency Medicine

## 2021-07-11 DIAGNOSIS — Z3A15 15 weeks gestation of pregnancy: Secondary | ICD-10-CM | POA: Diagnosis not present

## 2021-07-11 DIAGNOSIS — Z91040 Latex allergy status: Secondary | ICD-10-CM | POA: Diagnosis not present

## 2021-07-11 DIAGNOSIS — O368320 Maternal care for abnormalities of the fetal heart rate or rhythm, second trimester, not applicable or unspecified: Secondary | ICD-10-CM | POA: Insufficient documentation

## 2021-07-11 DIAGNOSIS — R102 Pelvic and perineal pain: Secondary | ICD-10-CM | POA: Insufficient documentation

## 2021-07-11 DIAGNOSIS — R1030 Lower abdominal pain, unspecified: Secondary | ICD-10-CM | POA: Diagnosis not present

## 2021-07-11 DIAGNOSIS — R002 Palpitations: Secondary | ICD-10-CM

## 2021-07-11 DIAGNOSIS — R350 Frequency of micturition: Secondary | ICD-10-CM | POA: Diagnosis not present

## 2021-07-11 LAB — CBC WITH DIFFERENTIAL/PLATELET
Abs Immature Granulocytes: 0.04 10*3/uL (ref 0.00–0.07)
Basophils Absolute: 0.1 10*3/uL (ref 0.0–0.1)
Basophils Relative: 0 %
Eosinophils Absolute: 0.2 10*3/uL (ref 0.0–0.5)
Eosinophils Relative: 1 %
HCT: 39.9 % (ref 36.0–46.0)
Hemoglobin: 13.8 g/dL (ref 12.0–15.0)
Immature Granulocytes: 0 %
Lymphocytes Relative: 34 %
Lymphs Abs: 3.9 10*3/uL (ref 0.7–4.0)
MCH: 29.6 pg (ref 26.0–34.0)
MCHC: 34.6 g/dL (ref 30.0–36.0)
MCV: 85.6 fL (ref 80.0–100.0)
Monocytes Absolute: 0.8 10*3/uL (ref 0.1–1.0)
Monocytes Relative: 7 %
Neutro Abs: 6.7 10*3/uL (ref 1.7–7.7)
Neutrophils Relative %: 58 %
Platelets: 281 10*3/uL (ref 150–400)
RBC: 4.66 MIL/uL (ref 3.87–5.11)
RDW: 12.6 % (ref 11.5–15.5)
WBC: 11.5 10*3/uL — ABNORMAL HIGH (ref 4.0–10.5)
nRBC: 0 % (ref 0.0–0.2)

## 2021-07-11 LAB — URINALYSIS, ROUTINE W REFLEX MICROSCOPIC
Bacteria, UA: NONE SEEN
Bilirubin Urine: NEGATIVE
Glucose, UA: NEGATIVE mg/dL
Ketones, ur: 5 mg/dL — AB
Leukocytes,Ua: NEGATIVE
Nitrite: NEGATIVE
Protein, ur: NEGATIVE mg/dL
Specific Gravity, Urine: 1.002 — ABNORMAL LOW (ref 1.005–1.030)
pH: 6 (ref 5.0–8.0)

## 2021-07-11 LAB — BASIC METABOLIC PANEL
Anion gap: 11 (ref 5–15)
BUN: 6 mg/dL (ref 6–20)
CO2: 20 mmol/L — ABNORMAL LOW (ref 22–32)
Calcium: 9.1 mg/dL (ref 8.9–10.3)
Chloride: 104 mmol/L (ref 98–111)
Creatinine, Ser: 0.69 mg/dL (ref 0.44–1.00)
GFR, Estimated: >60 mL/min (ref >60)
Glucose, Bld: 102 mg/dL — ABNORMAL HIGH (ref 70–99)
Potassium: 3.4 mmol/L — ABNORMAL LOW (ref 3.5–5.1)
Sodium: 135 mmol/L (ref 135–145)

## 2021-07-11 LAB — TROPONIN I (HIGH SENSITIVITY): Troponin I (High Sensitivity): 7 ng/L (ref <18)

## 2021-07-11 IMAGING — DX DG CHEST 2V
2 series · 2 of 2 positions shown · non-contrast
Comparison: [DATE]

CLINICAL DATA: Cardiac palpitations, 15 weeks pregnant

EXAM:
CHEST - 2 VIEW

[chest pa]
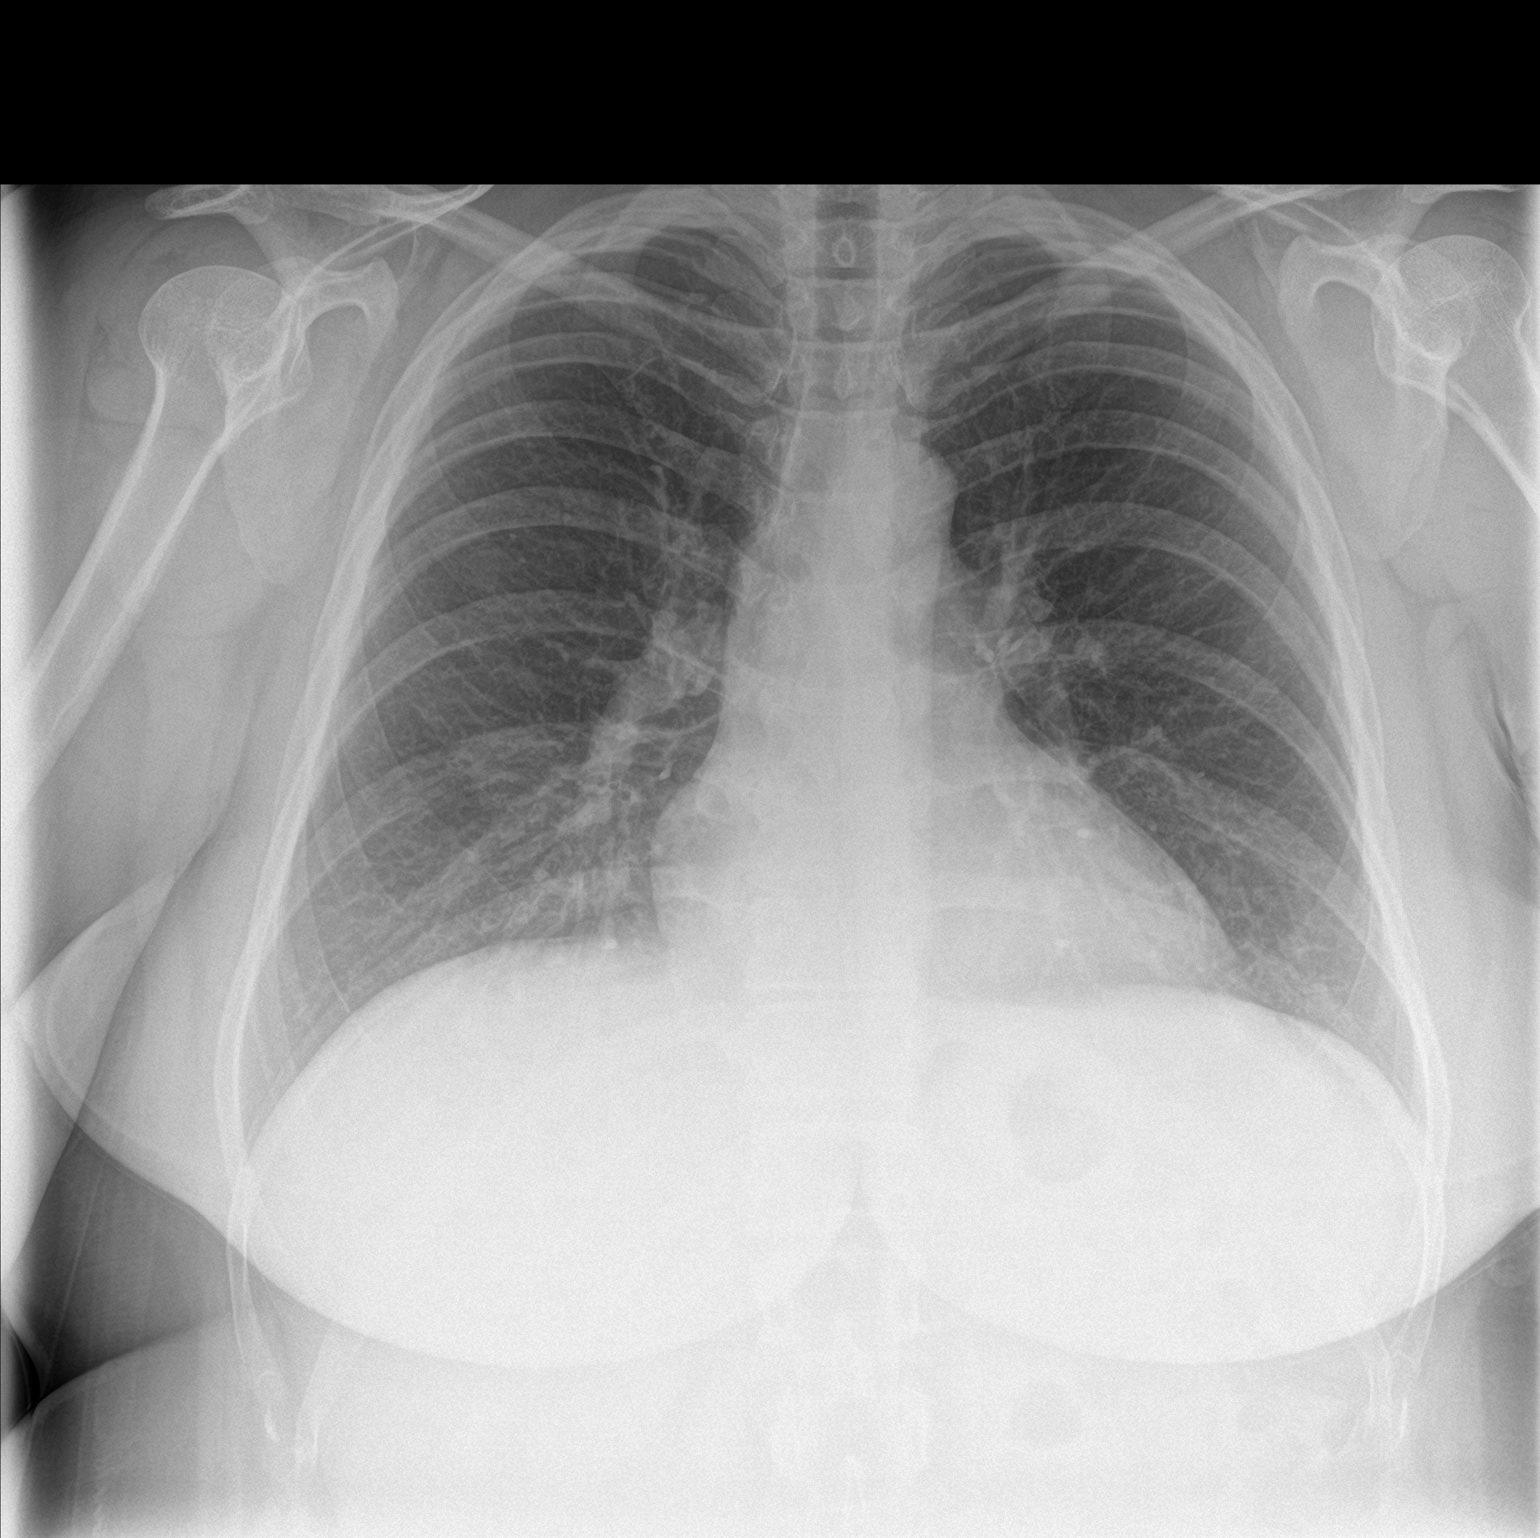

[chest lat]
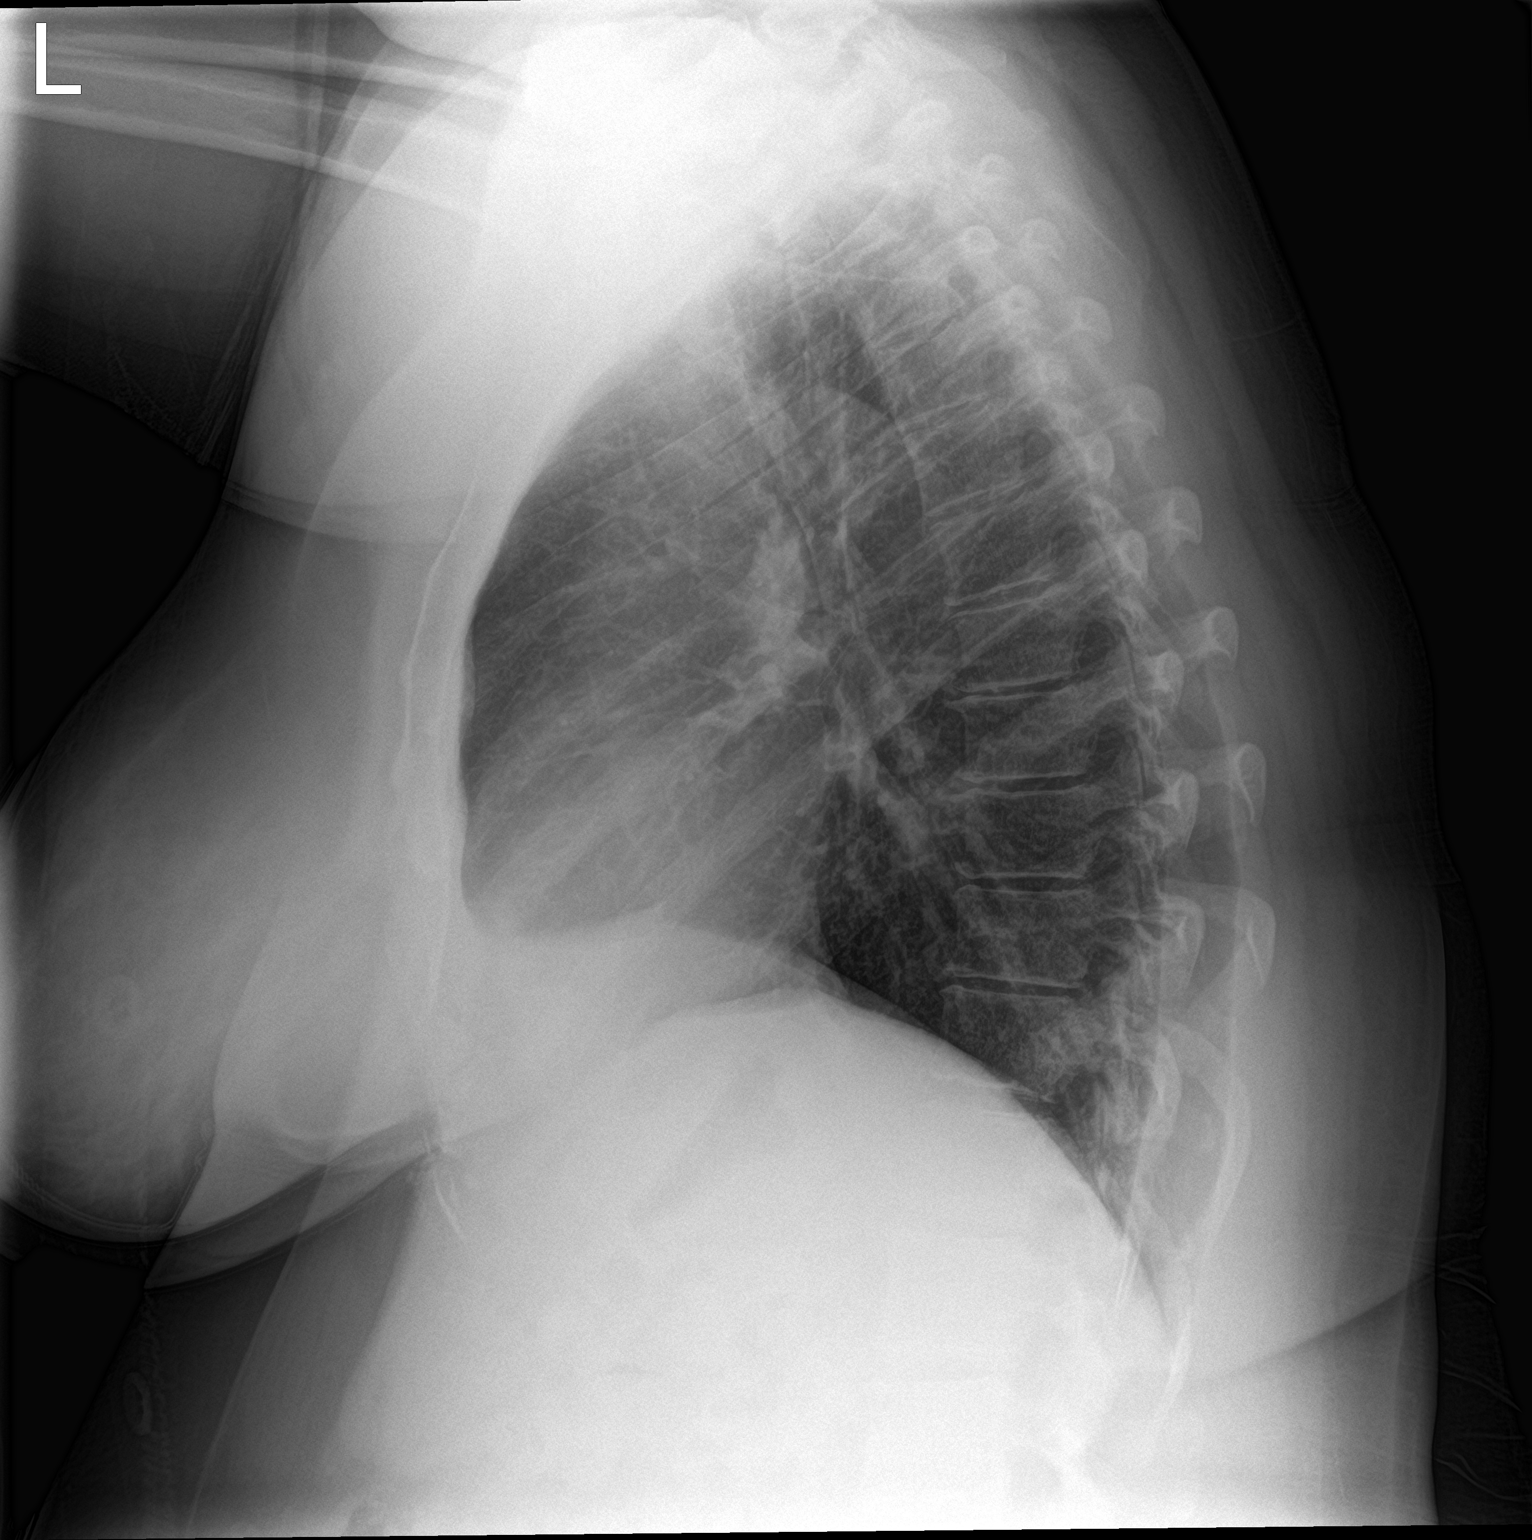

[2 of 2 positions shown; findings below may reference images not displayed]

FINDINGS: Cardiac shadow is within normal limits. The lungs are well aerated
bilaterally. No focal infiltrate or effusion is seen. No bony
abnormality is noted.
IMPRESSION: No acute abnormality noted.

## 2021-07-11 NOTE — Telephone Encounter (Signed)
Received approval from [facility]. PA #[id number] (07/09/21- 07/08/22).

## 2021-07-11 NOTE — ED Triage Notes (Signed)
Patient here with tachycardia, nausea, dizziness and urinary pressure.  States that it has been going on for a few days.  She states that she is [redacted] weeks pregnant.

## 2021-07-11 NOTE — ED Provider Notes (Addendum)
Emergency Medicine Provider Triage Evaluation Note  Debbie [name] , a 40 y.o. female  was evaluated in triage.  Pt complains of palpitations, elevated HR, and urinary frequency.  Currently seeing pelvic floor specialist due to ongoing issues, did have therapy today and constantly feels like she needs to urinate.  States getting ready for bed and HR elevated to 135 and felt jittery.  No real chest pain or SOB with it.  Does have thyroid issues (nodules).  No heavy caffeine use today.  Denies fever or other recent illness.    Review of Systems  Positive: Palpitations, urinary frequency Negative: fever  Physical Exam  BP (!) 176/92 (BP Location: Right Arm)   Pulse 87   Temp 98.8 F (37.1 C) (Oral)   Resp 16   SpO2 100%  Gen:   Awake, no distress   Resp:  Normal effort  MSK:   Moves extremities without difficulty  Other:  HR 80's during triage  Medical Decision Making  Medically screening exam initiated at 10:38 PM.  Appropriate orders placed.  Vincy Tarvin was informed that the remainder of the evaluation will be completed by another provider, this initial triage assessment does not replace that evaluation, and the importance of remaining in the ED until their evaluation is complete.  No tachycardia noted during triage.  Will check EKG, labs including TSH given her thyroid history, CXR, UA.  10:46 PM Patient has informed us that she is approx [redacted]wks pregnant with EDD 12/31/21.  No prior + hcg on file in our system.   Larene Pickett, PA-C 07/11/21 2240    Larene Pickett, PA-C 07/11/21 2247    Tegeler, Gwenyth Allegra, MD 07/12/21 [phone]

## 2021-07-12 LAB — I-STAT BETA HCG BLOOD, ED (MC, WL, AP ONLY): I-stat hCG, quantitative: >2000 m[IU]/mL — ABNORMAL HIGH (ref <5)

## 2021-07-12 LAB — TROPONIN I (HIGH SENSITIVITY): Troponin I (High Sensitivity): 6 ng/L (ref <18)

## 2021-07-12 LAB — TSH: TSH: 0.91 u[IU]/mL (ref 0.350–4.500)

## 2021-07-12 LAB — T4, FREE: Free T4: 0.7 ng/dL (ref 0.61–1.12)

## 2021-07-12 NOTE — ED Notes (Signed)
Pt discharged and wheeled out of the ED in a wheel chair without difficulty. 

## 2021-07-12 NOTE — Discharge Instructions (Addendum)
Please follow-up with your OB/GYN and your primary care physician.  Return for worsening or persistent symptoms.  Return for worsening abdominal pain vaginal bleeding or discharge.

## 2021-07-12 NOTE — ED Provider Notes (Signed)
[facility] EMERGENCY DEPARTMENT Provider Note   CSN: [id number] Arrival date & time: 07/11/21  2220     History Chief Complaint  Patient presents with   Tachycardia   Urinary Frequency    Debbie [name] is a 40 y.o. female.  40 yo F with chief complaint of palpitations.  Patient has had these off and on.  She is seen the doctor in the past for this and was told it likely was from a bone spur in her neck.  She feels better at the moment.  Has been having some lower abdominal discomfort since being found to be pregnant about 15 weeks ago.  Has had an ultrasound that confirmed an IUP.  Denies vaginal bleeding or discharge.  Denies any significant change in her discomfort.  Feels like her palpitations have resolved.  The history is provided by the patient.  Urinary Frequency Pertinent negatives include no chest pain, no headaches and no shortness of breath.  Illness Severity:  Moderate Onset quality:  Gradual Duration:  2 hours Timing:  Intermittent Progression:  Waxing and waning Chronicity:  Recurrent Associated symptoms: no chest pain, no congestion, no fever, no headaches, no myalgias, no nausea, no rhinorrhea, no shortness of breath, no vomiting and no wheezing       Past Medical History:  Diagnosis Date   Anxiety    self reported   Depression    self reported   Maxillary sinus cyst    left   Migraine    Multiple thyroid nodules    3; 1.9 cm and 2 cm, 0.6 cm, will see general  surgeon   No pertinent past medical history     Patient Active Problem List   Diagnosis Date Noted   Pelvic floor dysfunction 0000000   Umbilical hernia 0000000   Transient alteration of awareness 04/17/2021   Degenerative disc disease, cervical 04/16/2021   Thyroid mass 04/16/2021   Right shoulder pain 04/16/2021   Degenerative disc disease, lumbar 01/29/2021   Myofascial pain dysfunction syndrome 12/25/2020   Nonallopathic lesion of cervical region 12/25/2020    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   TBI (traumatic brain injury) ([place]) 01/18/2020   ADHD (attention deficit hyperactivity disorder), 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   Insomnia 12/10/2017   Chronic migraine without aura without status migrainosus, not intractable 03/05/2017    Past Surgical History:  Procedure Laterality Date   CESAREAN SECTION  2013   laparscopy     SHOULDER CAPSULORRHAPHY       OB History     Gravida  3   Para      Term      Preterm      AB  1   Living         SAB  1   IAB      Ectopic      Multiple      Live Births              Family History  Problem Relation Age of Onset   Heart disease Maternal Grandfather    Cerebral aneurysm Sister    Migraines Sister    Seizures Sister    Heart disease Other        mother's side   High Cholesterol Other        mother's side     Social History   Tobacco Use   Smoking status: Never   Smokeless tobacco: Never  Vaping Use   Vaping Use:  Never used  Substance Use Topics   Alcohol use: No   Drug use: No    Home Medications Prior to Admission medications   Medication Sig Start Date End Date Taking? Authorizing Provider  Botulinum Toxin Type A (BOTOX) 200 units SOLR Inject 155 Units as directed every 3 (three) months. Inject 155units to head and neck intramuscularly every 3 months. 06/11/20   Lomax, Amy, NP  gabapentin (NEURONTIN) 100 MG capsule Take 2 capsules (200 mg total) by mouth at bedtime. Patient taking differently: Take 200 mg by mouth at bedtime as needed (joint pain). 12/25/20   Lyndal Pulley, DO  loratadine (CLARITIN) 10 MG tablet Take 10 mg by mouth daily as needed for allergies.    [provider]  meclizine (ANTIVERT) 25 MG tablet Take 1 tablet (25 mg total) by mouth 3 (three) times daily as needed for dizziness. Patient not taking: Reported on 07/10/2021 02/25/21   Larene Pickett, PA-C  ondansetron (ZOFRAN ODT) 4 MG disintegrating tablet Take  1 tablet (4 mg total) by mouth every 8 (eight) hours as needed for nausea. Patient not taking: Reported on 07/10/2021 02/25/21   Larene Pickett, PA-C    Allergies    Tylenol [acetaminophen], Aspirin, Benadryl [diphenhydramine hcl], Buspirone, Citalopram hydrobromide, Dilaudid [hydromorphone hcl], Effexor xr [venlafaxine], Penicillins, Prednisone, Sulfamethoxazole-trimethoprim, Zoloft [sertraline], and Latex  Review of Systems   Review of Systems  Constitutional:  Negative for chills and fever.  HENT:  Negative for congestion and rhinorrhea.   Eyes:  Negative for redness and visual disturbance.  Respiratory:  Negative for shortness of breath and wheezing.   Cardiovascular:  Positive for palpitations. Negative for chest pain.  Gastrointestinal:  Negative for nausea and vomiting.  Genitourinary:  Positive for frequency and pelvic pain. Negative for dysuria, urgency, vaginal bleeding, vaginal discharge and vaginal pain.  Musculoskeletal:  Negative for arthralgias and myalgias.  Skin:  Negative for pallor and wound.  Neurological:  Negative for dizziness and headaches.   Physical Exam Updated Vital Signs BP (!) 146/83 (BP Location: Left Arm)   Pulse 89   Temp 98.6 F (37 C) (Oral)   Resp 18   LMP 03/26/2021 (Approximate)   SpO2 99%   Physical Exam Vitals and nursing note reviewed.  Constitutional:      General: She is not in acute distress.    Appearance: She is well-developed. She is not diaphoretic.  HENT:     Head: Normocephalic and atraumatic.  Eyes:     Pupils: Pupils are equal, round, and reactive to light.  Cardiovascular:     Rate and Rhythm: Normal rate and regular rhythm.     Heart sounds: No murmur heard.   No friction rub. No gallop.  Pulmonary:     Effort: Pulmonary effort is normal.     Breath sounds: No wheezing or rales.  Abdominal:     General: There is no distension.     Palpations: Abdomen is soft.     Tenderness: There is no abdominal tenderness.      Comments: Benign abdominal exam  Musculoskeletal:        General: No tenderness.     Cervical back: Normal range of motion and neck supple.  Skin:    General: Skin is warm and dry.  Neurological:     Mental Status: She is alert and oriented to person, place, and time.  Psychiatric:        Behavior: Behavior normal.    ED Results / Procedures /  Treatments   Labs (all labs ordered are listed, but only abnormal results are displayed) Labs Reviewed  CBC WITH DIFFERENTIAL/PLATELET - Abnormal; Notable for the following components:      Result Value   WBC 11.5 (*)    All other components within normal limits  BASIC METABOLIC PANEL - Abnormal; Notable for the following components:   Potassium 3.4 (*)    CO2 20 (*)    Glucose, Bld 102 (*)    All other components within normal limits  URINALYSIS, ROUTINE W REFLEX MICROSCOPIC - Abnormal; Notable for the following components:   Color, Urine STRAW (*)    Specific Gravity, Urine 1.002 (*)    Hgb urine dipstick SMALL (*)    Ketones, ur 5 (*)    All other components within normal limits  I-STAT BETA HCG BLOOD, ED ([facility], [facility], [facility] ONLY) - Abnormal; Notable for the following components:   I-stat hCG, quantitative >2,000.0 (*)    All other components within normal limits  TSH  T4, FREE  I-STAT BETA HCG BLOOD, ED ([facility], [facility], [facility] ONLY)  TROPONIN I (HIGH SENSITIVITY)  TROPONIN I (HIGH SENSITIVITY)    EKG EKG Interpretation  Date/Time:  Thursday July 11 2021 22:41:37 EDT Ventricular Rate:  94 PR Interval:  152 QRS Duration: 68 QT Interval:  350 QTC Calculation: 437 R Axis:   79 Text Interpretation: Normal sinus rhythm Low voltage QRS Cannot rule out Anterior infarct , age undetermined Abnormal ECG No significant change since last tracing Confirmed by Deno Etienne [phone]) on 07/12/2021 4:06:56 AM  Radiology DG Chest 2 View  Result Date: 07/11/2021 CLINICAL DATA:  Cardiac palpitations, [redacted] weeks pregnant EXAM: CHEST - 2 VIEW COMPARISON:   02/25/2021 FINDINGS: Cardiac shadow is within normal limits. The lungs are well aerated bilaterally. No focal infiltrate or effusion is seen. No bony abnormality is noted. IMPRESSION: No acute abnormality noted. Electronically Signed   By: Inez Catalina M.D.   On: 07/11/2021 23:17    Procedures Procedures   Medications Ordered in ED Medications - No data to display  ED Course  I have reviewed the triage vital signs and the nursing notes.  Pertinent labs & imaging results that were available during my care of the patient were reviewed by me and considered in my medical decision making (see chart for details).    MDM Rules/Calculators/A&P                           40 yo F with a chief complaints of palpitations.  Has had this occur off and on in the past and was told by her physician that it could be from a bone spur in her neck.  That has resolved.  She has had some lower abdominal discomfort since being pregnant this not significantly changed.  Has a benign abdominal exam for me.  Not tachycardic currently.  Laboratory evaluation unremarkable thyroid studies normal UA without infection no significant electrolyte abnormality no significant anemia.  Patient is pregnant and this is known, she is already seen OB/GYN and has had a ultrasound confirming an IUP.  We will discharge the patient home.  PCP follow-up.  OB follow-up.  4:48 AM:  I have discussed the diagnosis/risks/treatment options with the patient and believe the pt to be eligible for discharge home to follow-up with PCP, OB. We also discussed returning to the ED immediately if new or worsening sx occur. We discussed the sx which are most concerning (  e.g., sudden worsening pain, fever, inability to tolerate by mouth) that necessitate immediate return. Medications administered to the patient during their visit and any new prescriptions provided to the patient are listed below.  Medications given during this visit Medications - No data to  display   The patient appears reasonably screen and/or stabilized for discharge and I doubt any other medical condition or other [facility] requiring further screening, evaluation, or treatment in the ED at this time prior to discharge.   Final Clinical Impression(s) / ED Diagnoses Final diagnoses:  Palpitations    Rx / DC Orders ED Discharge Orders     None        Deno Etienne, DO 07/12/21 0448

## 2021-07-15 ENCOUNTER — Other Ambulatory Visit: Payer: Self-pay

## 2021-07-15 ENCOUNTER — Inpatient Hospital Stay (HOSPITAL_BASED_OUTPATIENT_CLINIC_OR_DEPARTMENT_OTHER): Payer: BC Managed Care – PPO

## 2021-07-15 ENCOUNTER — Emergency Department (HOSPITAL_COMMUNITY): Payer: BC Managed Care – PPO

## 2021-07-15 ENCOUNTER — Encounter (HOSPITAL_COMMUNITY): Payer: Self-pay | Admitting: *Deleted

## 2021-07-15 ENCOUNTER — Inpatient Hospital Stay (HOSPITAL_COMMUNITY)
Admission: AD | Admit: 2021-07-15 | Discharge: 2021-07-15 | Disposition: A | Discharge status: Home / Self Care | Payer: BC Managed Care – PPO | Attending: Obstetrics & Gynecology | Admitting: Obstetrics & Gynecology

## 2021-07-15 DIAGNOSIS — O321XX0 Maternal care for breech presentation, not applicable or unspecified: Secondary | ICD-10-CM | POA: Diagnosis not present

## 2021-07-15 DIAGNOSIS — Z880 Allergy status to penicillin: Secondary | ICD-10-CM | POA: Diagnosis not present

## 2021-07-15 DIAGNOSIS — Z79899 Other long term (current) drug therapy: Secondary | ICD-10-CM | POA: Diagnosis not present

## 2021-07-15 DIAGNOSIS — R109 Unspecified abdominal pain: Secondary | ICD-10-CM | POA: Diagnosis not present

## 2021-07-15 DIAGNOSIS — R002 Palpitations: Secondary | ICD-10-CM | POA: Insufficient documentation

## 2021-07-15 DIAGNOSIS — Z3A15 15 weeks gestation of pregnancy: Secondary | ICD-10-CM | POA: Insufficient documentation

## 2021-07-15 DIAGNOSIS — O26899 Other specified pregnancy related conditions, unspecified trimester: Secondary | ICD-10-CM

## 2021-07-15 DIAGNOSIS — Z3686 Encounter for antenatal screening for cervical length: Secondary | ICD-10-CM | POA: Diagnosis not present

## 2021-07-15 DIAGNOSIS — O26892 Other specified pregnancy related conditions, second trimester: Secondary | ICD-10-CM

## 2021-07-15 DIAGNOSIS — O99412 Diseases of the circulatory system complicating pregnancy, second trimester: Secondary | ICD-10-CM | POA: Insufficient documentation

## 2021-07-15 DIAGNOSIS — Z881 Allergy status to other antibiotic agents status: Secondary | ICD-10-CM | POA: Insufficient documentation

## 2021-07-15 DIAGNOSIS — Z888 Allergy status to other drugs, medicaments and biological substances status: Secondary | ICD-10-CM | POA: Insufficient documentation

## 2021-07-15 DIAGNOSIS — Z886 Allergy status to analgesic agent status: Secondary | ICD-10-CM | POA: Diagnosis not present

## 2021-07-15 DIAGNOSIS — Z8249 Family history of ischemic heart disease and other diseases of the circulatory system: Secondary | ICD-10-CM | POA: Insufficient documentation

## 2021-07-15 DIAGNOSIS — R1030 Lower abdominal pain, unspecified: Secondary | ICD-10-CM | POA: Insufficient documentation

## 2021-07-15 DIAGNOSIS — Z885 Allergy status to narcotic agent status: Secondary | ICD-10-CM | POA: Insufficient documentation

## 2021-07-15 LAB — BASIC METABOLIC PANEL
Anion gap: 10 (ref 5–15)
BUN: <5 mg/dL — ABNORMAL LOW (ref 6–20)
CO2: 20 mmol/L — ABNORMAL LOW (ref 22–32)
Calcium: 8.7 mg/dL — ABNORMAL LOW (ref 8.9–10.3)
Chloride: 105 mmol/L (ref 98–111)
Creatinine, Ser: 0.66 mg/dL (ref 0.44–1.00)
GFR, Estimated: >60 mL/min (ref >60)
Glucose, Bld: 125 mg/dL — ABNORMAL HIGH (ref 70–99)
Potassium: 3.6 mmol/L (ref 3.5–5.1)
Sodium: 135 mmol/L (ref 135–145)

## 2021-07-15 LAB — GC/CHLAMYDIA PROBE AMP (~~LOC~~) NOT AT ARMC
Chlamydia: NEGATIVE
Comment: NEGATIVE
Comment: NORMAL
Neisseria Gonorrhea: NEGATIVE

## 2021-07-15 LAB — URINALYSIS, ROUTINE W REFLEX MICROSCOPIC
Bilirubin Urine: NEGATIVE
Glucose, UA: NEGATIVE mg/dL
Hgb urine dipstick: NEGATIVE
Ketones, ur: NEGATIVE mg/dL
Leukocytes,Ua: NEGATIVE
Nitrite: NEGATIVE
Protein, ur: NEGATIVE mg/dL
Specific Gravity, Urine: 1.018 (ref 1.005–1.030)
pH: 5 (ref 5.0–8.0)

## 2021-07-15 LAB — CBC
HCT: 41.8 % (ref 36.0–46.0)
Hemoglobin: 14.3 g/dL (ref 12.0–15.0)
MCH: 29.5 pg (ref 26.0–34.0)
MCHC: 34.2 g/dL (ref 30.0–36.0)
MCV: 86.4 fL (ref 80.0–100.0)
Platelets: 278 10*3/uL (ref 150–400)
RBC: 4.84 MIL/uL (ref 3.87–5.11)
RDW: 12.4 % (ref 11.5–15.5)
WBC: 13.2 10*3/uL — ABNORMAL HIGH (ref 4.0–10.5)
nRBC: 0 % (ref 0.0–0.2)

## 2021-07-15 LAB — WET PREP, GENITAL
Clue Cells Wet Prep HPF POC: NONE SEEN
Sperm: NONE SEEN
Trich, Wet Prep: NONE SEEN
Yeast Wet Prep HPF POC: NONE SEEN

## 2021-07-15 LAB — TROPONIN I (HIGH SENSITIVITY): Troponin I (High Sensitivity): 6 ng/L (ref <18)

## 2021-07-15 LAB — I-STAT BETA HCG BLOOD, ED (MC, WL, AP ONLY): I-stat hCG, quantitative: >2000 m[IU]/mL — ABNORMAL HIGH (ref <5)

## 2021-07-15 IMAGING — US US MFM OB LIMITED
1 series · 15 of 24 positions shown · non-contrast
Comparison: none

[Series 1: us mfm ob limited · 15 of 24 slices shown]
[im 1/24]
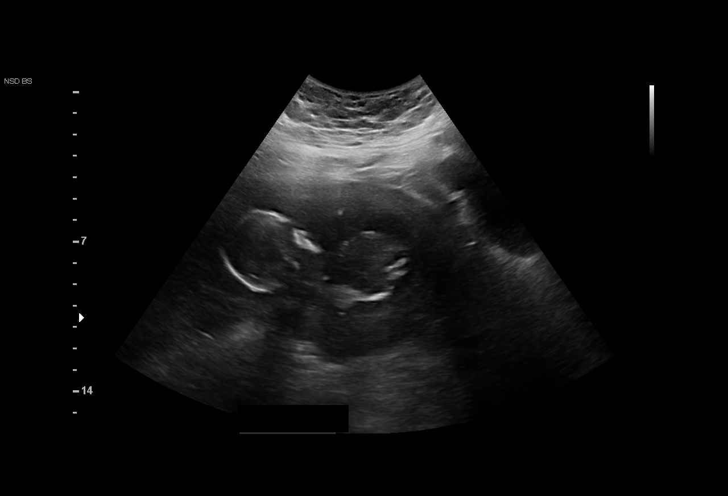
[im 3/24]
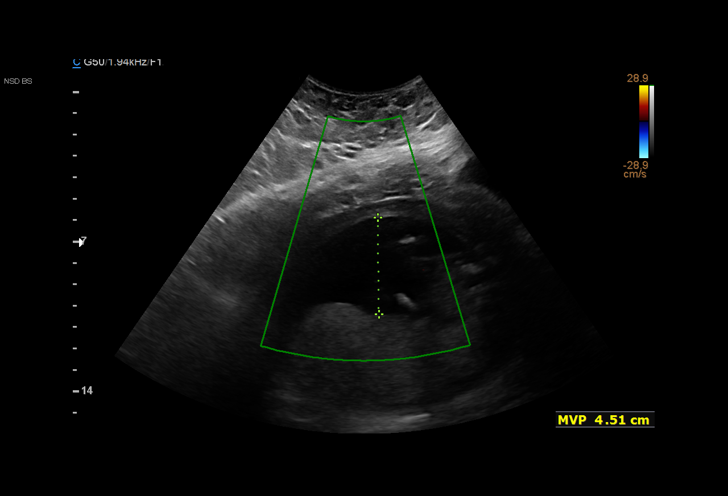
[im 5/24]
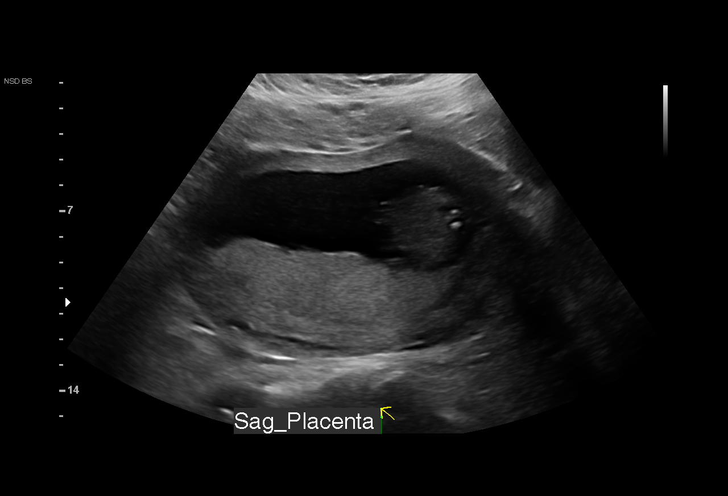
[im 6/24]
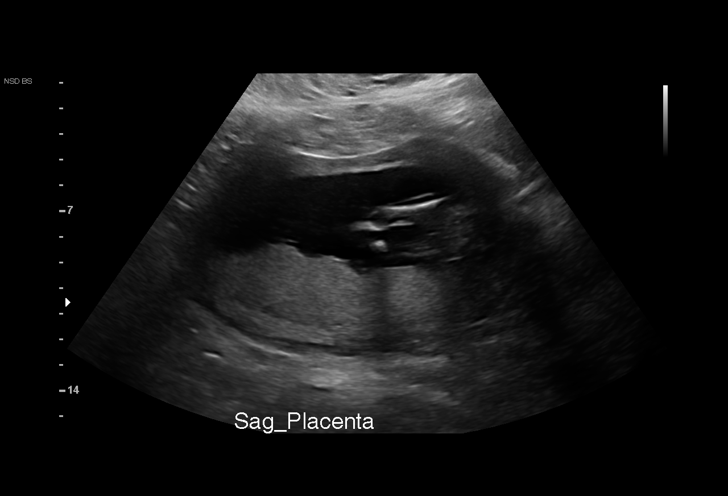
[im 8/24]
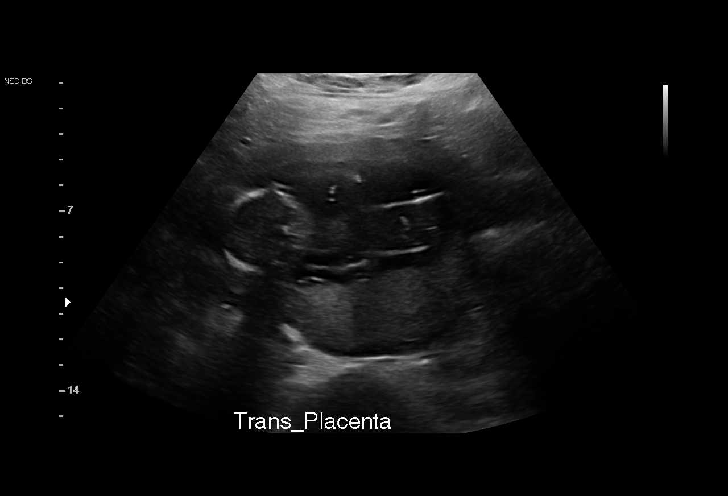
[im 9/24]
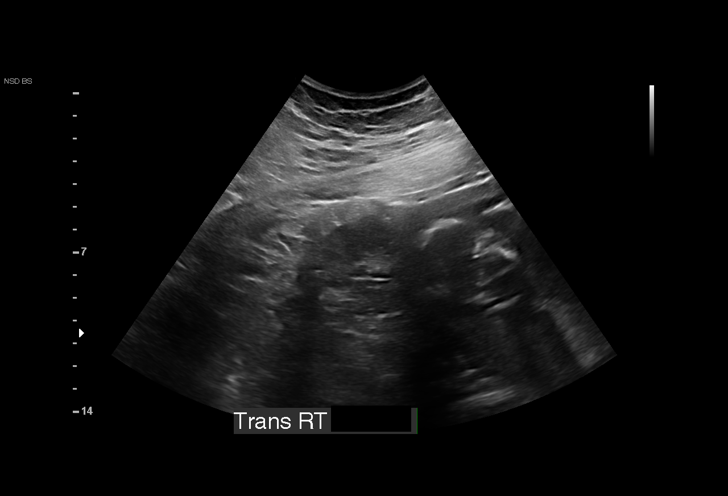
[im 11/24]
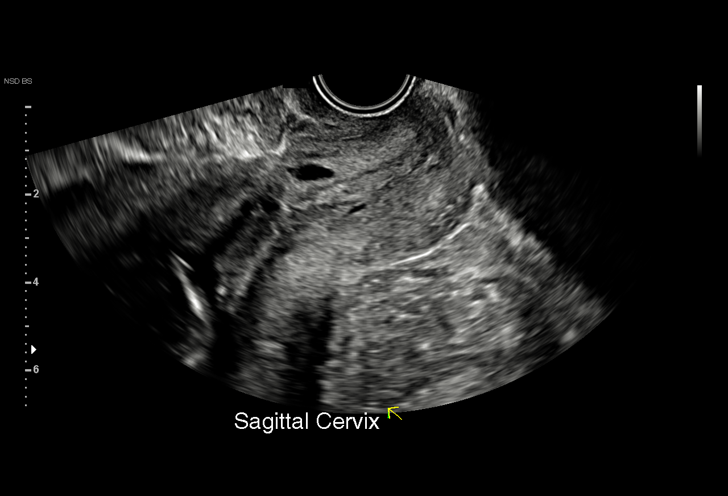
[im 13/24]
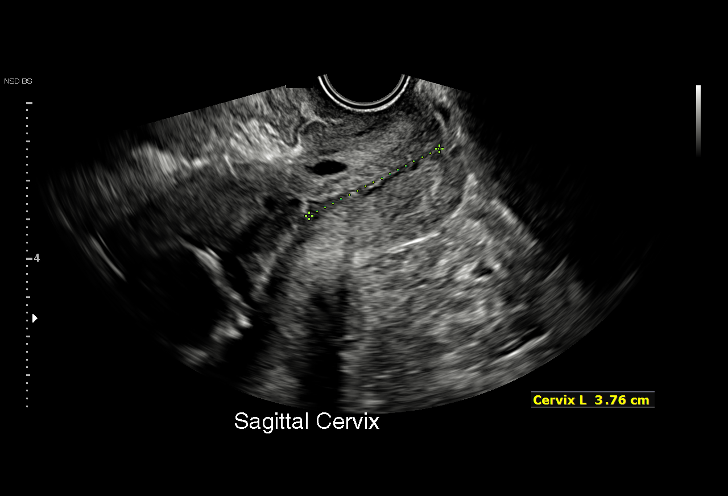
[im 14/24]
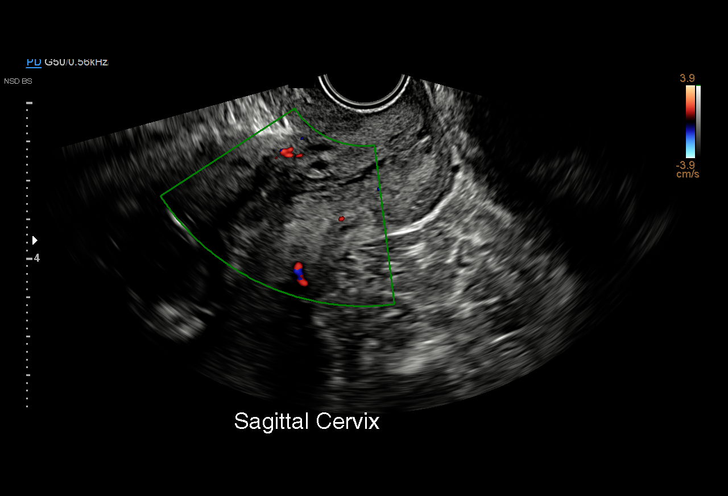
[im 16/24]
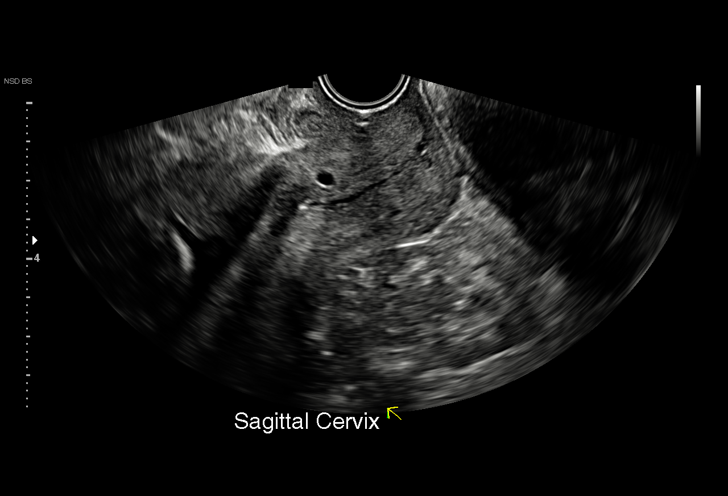
[im 17/24]
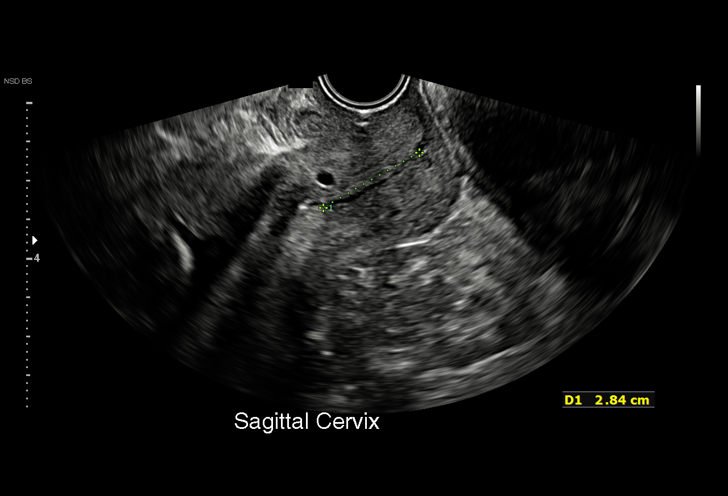
[im 19/24]
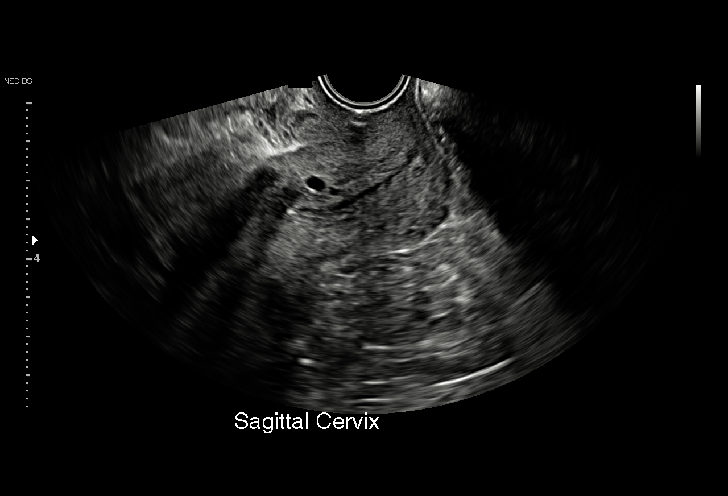
[im 21/24]
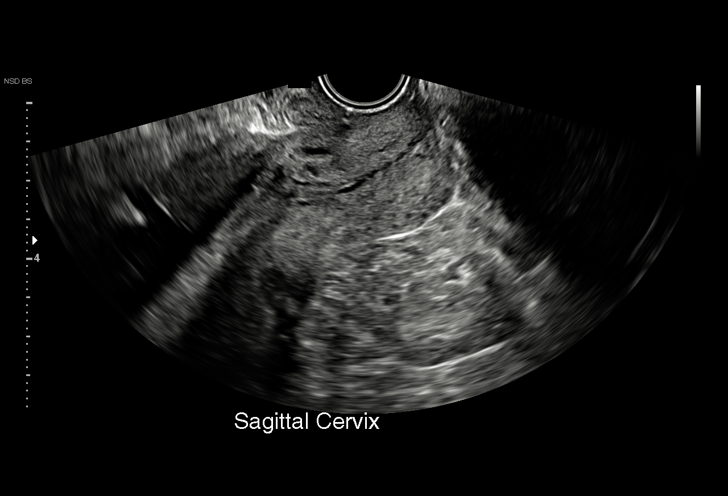
[im 22/24]
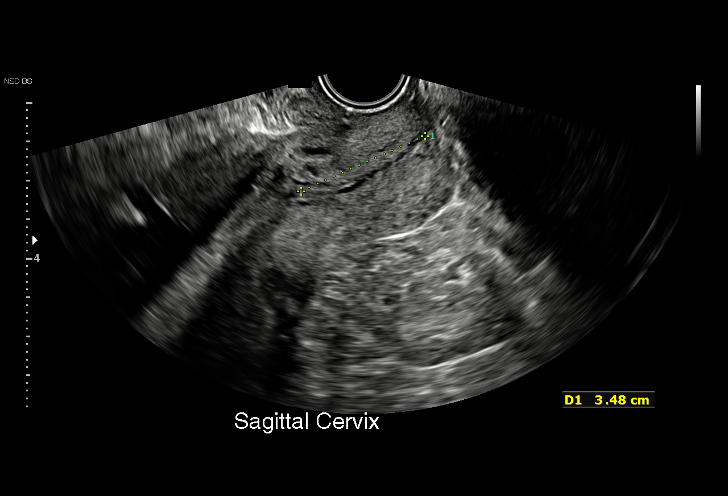
[im 24/24]
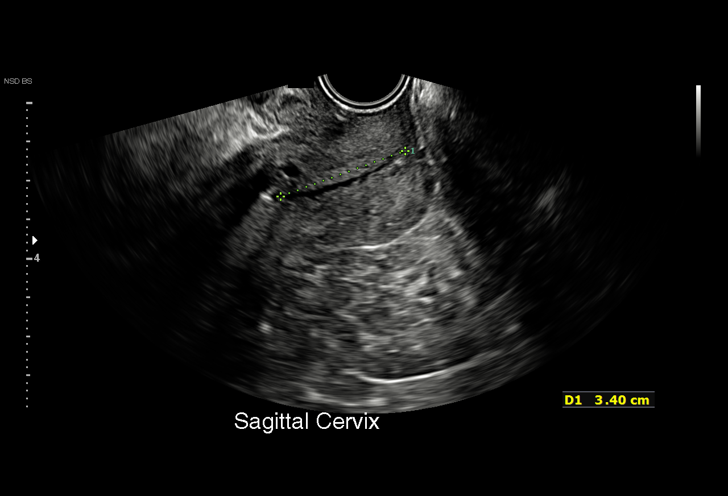

[15 of 24 positions shown; findings below may reference images not displayed]

Attending:        GARIBYAR      Secondary Phy.:   [facility] MAU/Triage

 1  US MFM OB LIMITED                     76815.01    GARIBYAR
 2  US MFM OB TRANSVAGINAL                76817.2     GARIBYAR

Indications

 Pelvic pain affecting pregnancy in second      [9K]
 trimester
 15 weeks gestation of pregnancy
 Encounter for cervical length                  [9K]
Fetal Evaluation

 Num Of Fetuses:         1
 Fetal Heart Rate(bpm):  162
 Cardiac Activity:       Observed
 Presentation:           Breech
 Placenta:               Posterior
 P. Cord Insertion:      Not well visualized

 Amniotic Fluid
 AFI FV:      Within normal limits

                             Largest Pocket(cm)

OB History

 Gravidity:    3         Term:   1        Prem:   0        SAB:   0
 TOP:          0       Ectopic:  1        Living: 1
Gestational Age

 LMP:           15w 6d        Date:  [DATE]                 EDD:   [DATE]
 Best:          15w 6d     Det. By:  LMP  ([DATE])          EDD:   [DATE]
Cervix Uterus Adnexa
 Cervix
 Length:            3.4  cm.
 Normal appearance by transvaginal scan

 Right Ovary
 Not visualized.

 Left Ovary
 Not visualized.

 Adnexa
 No adnexal mass visualized.
Impression

 Limited exam with pelvic pain
 The cervical length is normal.
 Breech presentation.
Recommendations

 Consider a complete anatomy at 20 weeks.

## 2021-07-15 IMAGING — CR DG CHEST 2V
2 series · 2 of 2 positions shown · non-contrast
Comparison: [DATE]

CLINICAL DATA: Palpitations, nausea

EXAM:
CHEST - 2 VIEW

[chest lat]
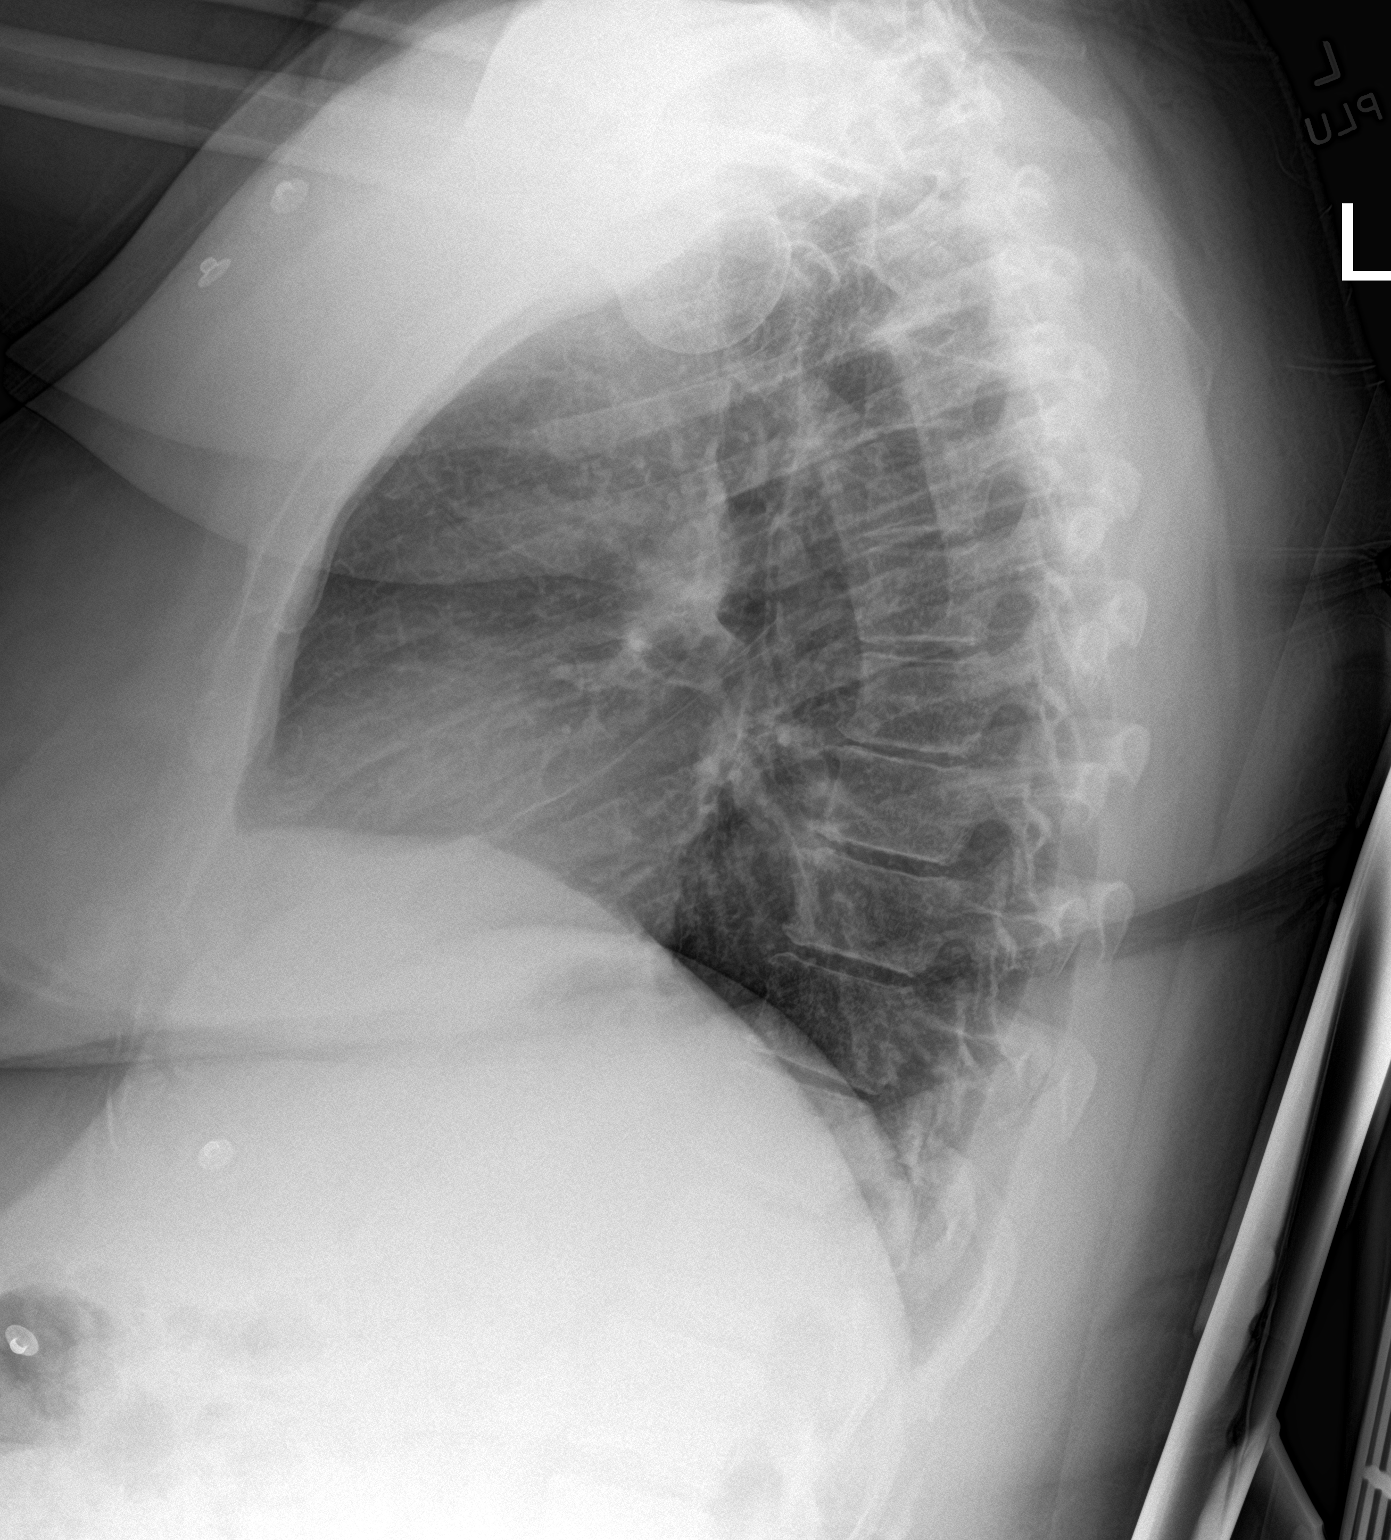

[chest ap]
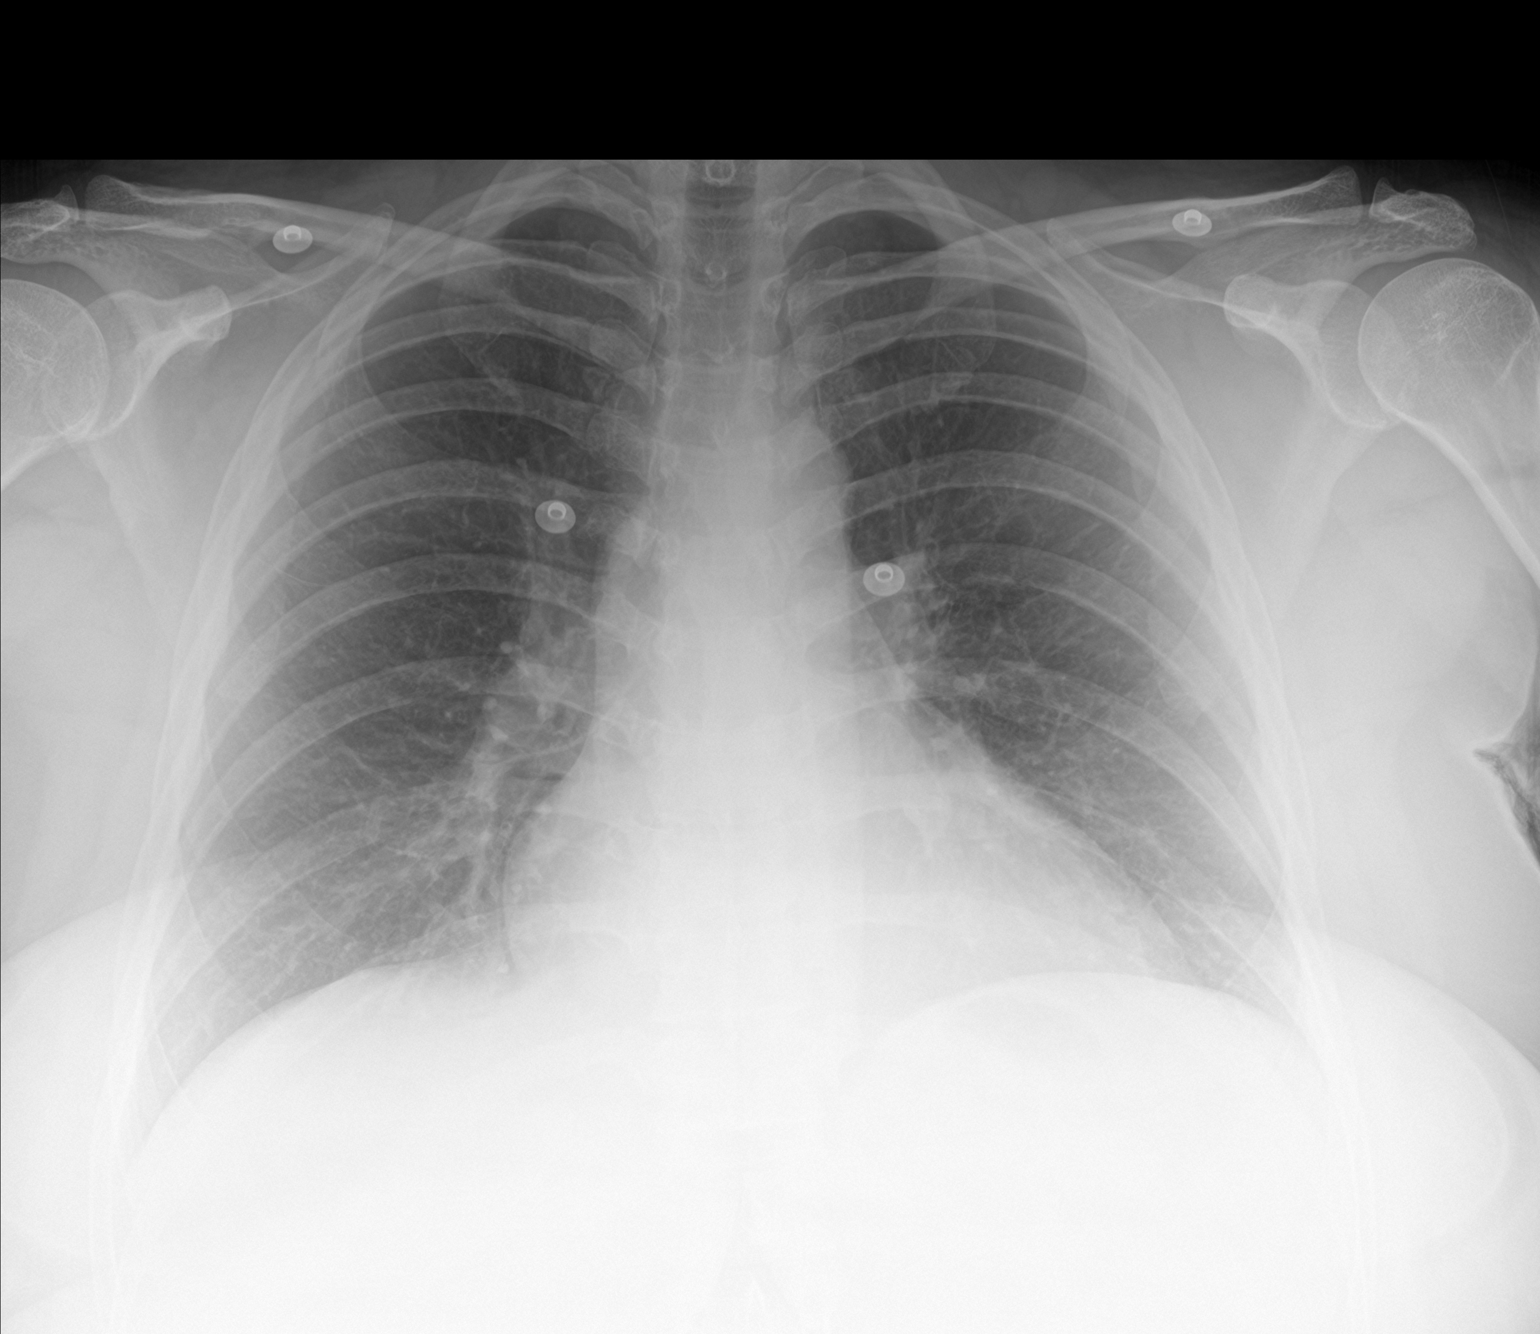

[2 of 2 positions shown; findings below may reference images not displayed]

FINDINGS: The heart size and mediastinal contours are within normal limits.
Both lungs are clear. The visualized skeletal structures are
unremarkable.
IMPRESSION: No active cardiopulmonary disease.

## 2021-07-15 NOTE — ED Provider Notes (Signed)
[facility] EMERGENCY DEPARTMENT Provider Note   CSN: [id number] Arrival date & time: 07/15/21  0003     History Chief Complaint  Patient presents with   Palpitations   Abdominal Pain    Debbie [name] is a 40 y.o. female.  40 year old female presents to the emergency department for evaluation of palpitations.  She has been experiencing palpitations over the past few months.  This is characterized as sensation of a fast heart rate.  She felt as though she became cold when her palpitations began with coolness of her extremities, nausea, blurred vision.  This is very similar to prior episodes of palpitations experienced.  Her episode tonight was more brief than once in the past.  Had onset of palpitations at 2300 with resolution by midnight.  States that her symptoms usually last a few hours before spontaneously resolving.  Has been following up with her primary care doctor for this, but has never seen a cardiologist.  She does have history of anxiety and depression.  Last evaluated 4 days ago for same clinical picture.  No associated fevers, syncope, chest pain.  Patient with secondary complaint of lower abdominal pain.  She has been experiencing lower abdominal cramping, soreness since earlier on in pregnancy.  She is followed by [facility] and reports confirmed IUP at 6 weeks.  Her next ultrasound (anatomy scan) is scheduled for 08/09/21.  Has some improvement in her discomfort with topical ice packs.  No improvement with ibuprofen; is allergic to Tylenol.  Tries to use NSAIDs sparingly during pregnancy.  She denies any vaginal bleeding, discharge, urinary symptoms.  UA was completed on 07/11/21 without concern for bacteriuria or pyuria.   The history is provided by the patient. No language interpreter was used.  Palpitations Abdominal Pain     Past Medical History:  Diagnosis Date   Anxiety    self reported   Depression    self reported   Maxillary sinus cyst     left   Migraine    Multiple thyroid nodules    3; 1.9 cm and 2 cm, 0.6 cm, will see general  surgeon   No pertinent past medical history     Patient Active Problem List   Diagnosis Date Noted   Pelvic floor dysfunction 0000000   Umbilical hernia 0000000   Transient alteration of awareness 04/17/2021   Degenerative disc disease, cervical 04/16/2021   Thyroid mass 04/16/2021   Right shoulder pain 04/16/2021   Degenerative disc disease, lumbar 01/29/2021   Myofascial pain dysfunction syndrome 12/25/2020   Nonallopathic lesion of cervical region 12/25/2020   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   TBI (traumatic brain injury) ([place]) 01/18/2020   ADHD (attention deficit hyperactivity disorder), 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   Insomnia 12/10/2017   Chronic migraine without aura without status migrainosus, not intractable 03/05/2017    Past Surgical History:  Procedure Laterality Date   CESAREAN SECTION  2013   laparscopy     SHOULDER CAPSULORRHAPHY       OB History     Gravida  3   Para      Term      Preterm      AB  1   Living         SAB  1   IAB      Ectopic      Multiple      Live Births  Family History  Problem Relation Age of Onset   Heart disease Maternal Grandfather    Cerebral aneurysm Sister    Migraines Sister    Seizures Sister    Heart disease Other        mother's side   High Cholesterol Other        mother's side     Social History   Tobacco Use   Smoking status: Never   Smokeless tobacco: Never  Vaping Use   Vaping Use: Never used  Substance Use Topics   Alcohol use: No   Drug use: No    Home Medications Prior to Admission medications   Medication Sig Start Date End Date Taking? Authorizing Provider  Botulinum Toxin Type A (BOTOX) 200 units SOLR Inject 155 Units as directed every 3 (three) months. Inject 155units to head and neck intramuscularly every 3 months. 06/11/20    Lomax, Amy, NP  gabapentin (NEURONTIN) 100 MG capsule Take 2 capsules (200 mg total) by mouth at bedtime. Patient taking differently: Take 200 mg by mouth at bedtime as needed (joint pain). 12/25/20   Lyndal Pulley, DO  loratadine (CLARITIN) 10 MG tablet Take 10 mg by mouth daily as needed for allergies.    [provider]  meclizine (ANTIVERT) 25 MG tablet Take 1 tablet (25 mg total) by mouth 3 (three) times daily as needed for dizziness. Patient not taking: Reported on 07/10/2021 02/25/21   Larene Pickett, PA-C  ondansetron (ZOFRAN ODT) 4 MG disintegrating tablet Take 1 tablet (4 mg total) by mouth every 8 (eight) hours as needed for nausea. Patient not taking: Reported on 07/10/2021 02/25/21   Larene Pickett, PA-C    Allergies    Tylenol [acetaminophen], Aspirin, Benadryl [diphenhydramine hcl], Buspirone, Citalopram hydrobromide, Dilaudid [hydromorphone hcl], Effexor xr [venlafaxine], Penicillins, Prednisone, Sulfamethoxazole-trimethoprim, Zoloft [sertraline], and Latex  Review of Systems   Review of Systems  Cardiovascular:  Positive for palpitations.  Gastrointestinal:  Positive for abdominal pain.  Ten systems reviewed and are negative for acute change, except as noted in the HPI.    Physical Exam Updated Vital Signs BP (!) 131/95 (BP Location: Left Arm)   Pulse 88   Temp 99.8 F (37.7 C)   Resp 16   LMP 03/26/2021 (Approximate)   SpO2 98%   Physical Exam Vitals and nursing note reviewed.  Constitutional:      General: She is not in acute distress.    Appearance: She is well-developed. She is not diaphoretic.     Comments: Nontoxic appearing and in NAD  HENT:     Head: Normocephalic and atraumatic.  Eyes:     General: No scleral icterus.    Conjunctiva/sclera: Conjunctivae normal.  Cardiovascular:     Rate and Rhythm: Normal rate and regular rhythm.     Pulses: Normal pulses.  Pulmonary:     Effort: Pulmonary effort is normal. No respiratory distress.      Breath sounds: No stridor. No wheezing.     Comments: Respirations even and unlabored Abdominal:     Palpations: Abdomen is soft.     Comments: Gravid abdomen with obesity. No peritoneal signs.  Genitourinary:    Comments: Deferred  Musculoskeletal:        General: Normal range of motion.     Cervical back: Normal range of motion.  Skin:    General: Skin is warm and dry.     Coloration: Skin is not pale.     Findings: No erythema or rash.  Neurological:     Mental Status: She is alert and oriented to person, place, and time.     Coordination: Coordination normal.  Psychiatric:        Behavior: Behavior normal.    ED Results / Procedures / Treatments   Labs (all labs ordered are listed, but only abnormal results are displayed) Labs Reviewed  BASIC METABOLIC PANEL - Abnormal; Notable for the following components:      Result Value   CO2 20 (*)    Glucose, Bld 125 (*)    BUN <5 (*)    Calcium 8.7 (*)    All other components within normal limits  CBC - Abnormal; Notable for the following components:   WBC 13.2 (*)    All other components within normal limits  I-STAT BETA HCG BLOOD, ED ([facility], [facility], [facility] ONLY) - Abnormal; Notable for the following components:   I-stat hCG, quantitative >2,000.0 (*)    All other components within normal limits  TROPONIN I (HIGH SENSITIVITY)    EKG EKG Interpretation  Date/Time:  Monday July 15 2021 00:16:40 EDT Ventricular Rate:  107 PR Interval:  142 QRS Duration: 70 QT Interval:  324 QTC Calculation: 432 R Axis:   96 Text Interpretation: Sinus tachycardia Rightward axis Low voltage QRS Cannot rule out Anterior infarct , age undetermined Abnormal ECG Confirmed by Gerlene Fee [phone]) on 07/15/2021 2:03:09 AM  Radiology DG Chest 2 View  Result Date: 07/15/2021 CLINICAL DATA:  Palpitations, nausea EXAM: CHEST - 2 VIEW COMPARISON:  07/11/2021 FINDINGS: The heart size and mediastinal contours are within normal limits. Both lungs are clear.  The visualized skeletal structures are unremarkable. IMPRESSION: No active cardiopulmonary disease. Electronically Signed   By: Fidela Salisbury M.D.   On: 07/15/2021 00:45    Procedures Procedures   Medications Ordered in ED Medications - No data to display  ED Course  I have reviewed the triage vital signs and the nursing notes.  Pertinent labs & imaging results that were available during my care of the patient were reviewed by me and considered in my medical decision making (see chart for details).  Clinical Course as of 07/18/21 0016  Mon Jul 15, 2021  0123 Spoke with [name], NP at [facility]. Recommends transfer for evaluation of abdominal pain once cleared from a palpitations perspective. [KH]  0222 Leukocytosis is chronic and stable.  Also not uncommon in the setting of pregnancy. [KH]  0223 Troponin negative. EKG with sinus tachycardia. Rate faster than prior, but otherwise unchanged on my interpretation. [KH]  [id number] Patient continues to report that her palpitations have resolved.  Most recent heart rate 88.  She does have persistent lower abdominal soreness.  We will proceed with transfer to MAU for further evaluation. [KH]    Clinical Course User Index [KH] Beverely Pace   MDM Rules/Calculators/A&P                           41 year old female presents to the emergency department for complaints of palpitations.  She has been experiencing palpitations sporadically over the past few months.  Reports that symptoms tonight actually lasted for more brief.  Then they normally do.  She has been asymptomatic throughout her stay in the ED.  EKG without signs of arrhythmia.  Chest x-ray ordered in triage which is negative for acute cardiopulmonary abnormality.  Bonin negative.  No significant electrolyte derangements.  Plan for referral to cardiology given chronicity of symptoms and  recurrence.  Patient presently pregnant and also with complaints of lower abdominal discomfort.  She will be  transferred to MAU for further evaluation of this complaint.   Final Clinical Impression(s) / ED Diagnoses Final diagnoses:  Palpitations  Abdominal pain in pregnancy, second trimester    Rx / DC Orders ED Discharge Orders          Ordered    AMB Referral to [facility]        07/15/21 0125             Antonietta Breach, PA-C 07/18/21 0144    Maudie Flakes, MD 07/18/21 [phone]

## 2021-07-15 NOTE — ED Triage Notes (Signed)
Pt states acute onset palpitaions, lower abd pain, blurred vision, nausea.  States tx for same on 08/18.

## 2021-07-15 NOTE — MAU Provider Note (Addendum)
History     CSN: [id number]  Arrival date and time: 07/15/21 0003   Event Date/Time   First Provider Initiated Contact with Patient 07/15/21 [phone]      Chief Complaint  Patient presents with   Palpitations   Abdominal Pain   Debbie [name] is a 40 y.o. G3P1011 at 76w6dwho presents to MAU for lower abdominal pain. Patient was sent from the ED after a work-up for palpitations as she was complaining of lower abdominal pain. Patient states that she has been experiencing lower abdominal pain since 06/29/2021. Patient states her OB is aware and that she has been prescribed Gabapentin and ibuprofen for the pain and is currently working with a pelvic physical therapist who believes she has pelvic floor dysfunction and has given her exercises to perform at home. Patient states the pain is across the front of her lower abdomen. Patient states the pain is about the same today as it has been for the last few weeks, but states that perhaps it is "a little more constant" today. Patient denies bleeding or leaking of fluid. Patient states she feels the pain mostly internally, such as inside her vagina where it feels sore and like she got kicked there. Patient states the pain makes it difficult for her to walk at times, but she does get up and around without issue.  Pt denies VB, LOF, ctx, decreased FM, vaginal discharge/odor/itching. Pt denies N/V, constipation, diarrhea, or urinary problems. Pt denies fever, chills, fatigue, sweating or changes in appetite. Pt denies SOB or chest pain. Pt denies dizziness, HA, light-headedness, weakness.   OB History     Gravida  3   Para  1   Term  1   Preterm      AB  1   Living  1      SAB  0   IAB      Ectopic  1   Multiple      Live Births  1           Past Medical History:  Diagnosis Date   Anxiety    self reported   Depression    self reported   Maxillary sinus cyst    left   Migraine    Multiple thyroid nodules    3; 1.9 cm  and 2 cm, 0.6 cm, will see general  surgeon   No pertinent past medical history     Past Surgical History:  Procedure Laterality Date   CESAREAN SECTION  2013   laparscopy     SHOULDER CAPSULORRHAPHY      Family History  Problem Relation Age of Onset   Heart disease Maternal Grandfather    Cerebral aneurysm Sister    Migraines Sister    Seizures Sister    Heart disease Other        mother's side   High Cholesterol Other        mother's side     Social History   Tobacco Use   Smoking status: Never   Smokeless tobacco: Never  Vaping Use   Vaping Use: Never used  Substance Use Topics   Alcohol use: No   Drug use: No    Allergies:  Allergies  Allergen Reactions   Tylenol [Acetaminophen] Anaphylaxis   Aspirin Other (See Comments)    Unknown childhood rxn   Benadryl [Diphenhydramine Hcl] Itching   Buspirone     Other reaction(s): migraines   Citalopram Hydrobromide     Other reaction(s):  agitation and dizziness   Dilaudid [Hydromorphone Hcl] Itching    Pill only   Effexor Xr [Venlafaxine]     Other reaction(s): More depressed (11/2017-neurology)   Penicillins     hives   Prednisone     Swelling, hives   Sulfamethoxazole-Trimethoprim     Other reaction(s): swelling (2017)   Zoloft [Sertraline]     Other reaction(s): nausea and swelling   Latex Rash    Medications Prior to Admission  Medication Sig Dispense Refill Last Dose   ondansetron (ZOFRAN ODT) 4 MG disintegrating tablet Take 1 tablet (4 mg total) by mouth every 8 (eight) hours as needed for nausea. 10 tablet 0 07/14/2021   Prenatal Vit-Fe Fumarate-FA (PRENATAL MULTIVITAMIN) TABS tablet Take 1 tablet by mouth daily at 12 noon.   07/14/2021   Botulinum Toxin Type A (BOTOX) 200 units SOLR Inject 155 Units as directed every 3 (three) months. Inject 155units to head and neck intramuscularly every 3 months. 1 each 3 More than a month   gabapentin (NEURONTIN) 100 MG capsule Take 2 capsules (200 mg total) by  mouth at bedtime. (Patient taking differently: Take 200 mg by mouth at bedtime as needed (joint pain).) 180 capsule 0    loratadine (CLARITIN) 10 MG tablet Take 10 mg by mouth daily as needed for allergies.   More than a month   meclizine (ANTIVERT) 25 MG tablet Take 1 tablet (25 mg total) by mouth 3 (three) times daily as needed for dizziness. (Patient not taking: No sig reported) 30 tablet 0 More than a month    Review of Systems  Constitutional:  Negative for chills, diaphoresis, fatigue and fever.  Eyes:  Negative for visual disturbance.  Respiratory:  Negative for shortness of breath.   Cardiovascular:  Negative for chest pain.  Gastrointestinal:  Positive for abdominal pain. Negative for constipation, diarrhea, nausea and vomiting.  Genitourinary:  Negative for dysuria, flank pain, frequency, pelvic pain, urgency, vaginal bleeding and vaginal discharge.  Neurological:  Negative for dizziness, weakness, light-headedness and headaches.   Physical Exam   Blood pressure (!) 103/59, pulse 92, temperature 97.9 F (36.6 C), temperature source Oral, resp. rate 16, height '5\' 3"'$  (1.6 m), weight 106.1 kg, last menstrual period 03/26/2021, SpO2 96 %, unknown if currently breastfeeding.  Patient Vitals for the past 24 hrs:  BP Temp Temp src Pulse Resp SpO2 Height Weight  07/15/21 0648 (!) 103/59 97.9 F (36.6 C) Oral 92 16 -- -- --  07/15/21 0325 105/77 98.4 F (36.9 C) Oral 96 16 96 % -- --  07/15/21 0317 -- -- -- -- -- -- '5\' 3"'$  (1.6 m) 106.1 kg  07/15/21 0241 (!) 131/95 -- -- 88 16 98 % -- --  07/15/21 0133 126/84 -- -- 94 18 98 % -- --  07/15/21 0021 (!) 116/101 99.8 F (37.7 C) -- (!) 110 20 100 % -- --   Physical Exam Vitals and nursing note reviewed. Exam conducted with a chaperone present.  Constitutional:      General: She is not in acute distress.    Appearance: Normal appearance. She is well-developed. She is not ill-appearing, toxic-appearing or diaphoretic.  HENT:     Head:  Normocephalic and atraumatic.  Pulmonary:     Effort: Pulmonary effort is normal.  Abdominal:     General: There is no distension.     Palpations: Abdomen is soft. There is no mass.     Tenderness: There is no abdominal tenderness. There is no guarding or  rebound.     Comments: No pubic symphysis pain.  Genitourinary:    General: Normal vulva.     Labia:        Right: No rash, tenderness or lesion.        Left: No rash, tenderness or lesion.      Comments: Cervix closed but feels shortened on exam, will send for [place]. Skin:    General: Skin is warm and dry.  Neurological:     Mental Status: She is alert and oriented to person, place, and time.  Psychiatric:        Mood and Affect: Mood normal.        Behavior: Behavior normal.        Thought Content: Thought content normal.        Judgment: Judgment normal.   Results for orders placed or performed during the hospital encounter of 07/15/21 (from the past 24 hour(s))  Basic metabolic panel     Status: Abnormal   Collection Time: 07/15/21 12:51 AM  Result Value Ref Range   Sodium 135 135 - 145 mmol/L   Potassium 3.6 3.5 - 5.1 mmol/L   Chloride 105 98 - 111 mmol/L   CO2 20 (L) 22 - 32 mmol/L   Glucose, Bld 125 (H) 70 - 99 mg/dL   BUN <5 (L) 6 - 20 mg/dL   Creatinine, Ser 0.66 0.44 - 1.00 mg/dL   Calcium 8.7 (L) 8.9 - 10.3 mg/dL   GFR, Estimated >60 >60 mL/min   Anion gap 10 5 - 15  CBC     Status: Abnormal   Collection Time: 07/15/21 12:51 AM  Result Value Ref Range   WBC 13.2 (H) 4.0 - 10.5 K/uL   RBC 4.84 3.87 - 5.11 MIL/uL   Hemoglobin 14.3 12.0 - 15.0 g/dL   HCT 41.8 36.0 - 46.0 %   MCV 86.4 80.0 - 100.0 fL   MCH 29.5 26.0 - 34.0 pg   MCHC 34.2 30.0 - 36.0 g/dL   RDW 12.4 11.5 - 15.5 %   Platelets 278 150 - 400 K/uL   nRBC 0.0 0.0 - 0.2 %  Troponin I (High Sensitivity)     Status: None   Collection Time: 07/15/21 12:51 AM  Result Value Ref Range   Troponin I (High Sensitivity) 6 <18 ng/L  I-Stat beta hCG blood, ED      Status: Abnormal   Collection Time: 07/15/21  1:35 AM  Result Value Ref Range   I-stat hCG, quantitative >2,000.0 (H) <5 mIU/mL   Comment 3          Wet prep, genital     Status: Abnormal   Collection Time: 07/15/21  3:56 AM   Specimen: Vaginal  Result Value Ref Range   Yeast Wet Prep HPF POC NONE SEEN NONE SEEN   Trich, Wet Prep NONE SEEN NONE SEEN   Clue Cells Wet Prep HPF POC NONE SEEN NONE SEEN   WBC, Wet Prep HPF POC FEW (A) NONE SEEN   Sperm NONE SEEN   Urinalysis, Routine w reflex microscopic Urine, Clean Catch     Status: Abnormal   Collection Time: 07/15/21  3:57 AM  Result Value Ref Range   Color, Urine YELLOW YELLOW   APPearance HAZY (A) CLEAR   Specific Gravity, Urine 1.018 1.005 - 1.030   pH 5.0 5.0 - 8.0   Glucose, UA NEGATIVE NEGATIVE mg/dL   Hgb urine dipstick NEGATIVE NEGATIVE   Bilirubin Urine NEGATIVE NEGATIVE  Ketones, ur NEGATIVE NEGATIVE mg/dL   Protein, ur NEGATIVE NEGATIVE mg/dL   Nitrite NEGATIVE NEGATIVE   Leukocytes,Ua NEGATIVE NEGATIVE    DG Chest 2 View  Result Date: 07/15/2021 CLINICAL DATA:  Palpitations, nausea EXAM: CHEST - 2 VIEW COMPARISON:  07/11/2021 FINDINGS: The heart size and mediastinal contours are within normal limits. Both lungs are clear. The visualized skeletal structures are unremarkable. IMPRESSION: No active cardiopulmonary disease. Electronically Signed   By: Fidela Salisbury M.D.   On: 07/15/2021 00:45   DG Chest 2 View  Result Date: 07/11/2021 CLINICAL DATA:  Cardiac palpitations, [redacted] weeks pregnant EXAM: CHEST - 2 VIEW COMPARISON:  02/25/2021 FINDINGS: Cardiac shadow is within normal limits. The lungs are well aerated bilaterally. No focal infiltrate or effusion is seen. No bony abnormality is noted. IMPRESSION: No acute abnormality noted. Electronically Signed   By: Inez Catalina M.D.   On: 07/11/2021 23:17    MAU Course  Procedures  MDM -suspect RLP, r/o PTL -UA: hazy, otherwise WNL, sending urine for culture based on  symptoms -CE: closed but feels shortened on exam, sending for [place] -[place]: CL 3.4cm -WetPrep: WNL -GC/CT collected -FHT: 161 -pt discharged to home in stable conditions  Orders Placed This Encounter  Procedures   Wet prep, genital    Standing Status:   Standing    Number of Occurrences:   1    Order Specific Question:   Patient immune status    Answer:   Normal   Culture, OB Urine    Standing Status:   Standing    Number of Occurrences:   1    Order Specific Question:   Patient immune status    Answer:   Normal   DG Chest 2 View    If patient pregnant, contact provider.    Standing Status:   Standing    Number of Occurrences:   1    Order Specific Question:   Reason for Exam (SYMPTOM  OR DIAGNOSIS REQUIRED)    Answer:   palpitations    Order Specific Question:   Radiology Contrast Protocol - do NOT remove file path    Answer:   [email or web address]   [place] MFM OB LIMITED    Please perform cervical length.    Standing Status:   Standing    Number of Occurrences:   1    Order Specific Question:   Symptom/Reason for Exam    Answer:   Abdominal pain in pregnancy [id number]   [place] MFM OB Transvaginal    Please perform cervical length.    Standing Status:   Standing    Number of Occurrences:   1    Order Specific Question:   Symptom/Reason for Exam    Answer:   Abdominal pain in pregnancy [id number]   Basic metabolic panel    Standing Status:   Standing    Number of Occurrences:   1   CBC    Standing Status:   Standing    Number of Occurrences:   1   Urinalysis, Routine w reflex microscopic Urine, Clean Catch    Standing Status:   Standing    Number of Occurrences:   1   AMB Referral to [facility]    Referral Priority:   Routine    Referral Type:   Consultation    Referral Reason:   Specialty Services Required    Requested Specialty:   Cardiology    Number of Visits Requested:   1   Document  Height and Actual Weight     Use scales to weigh patient, not stated or estimated weight.    Standing Status:   Standing    Number of Occurrences:   1   Apply ice    Standing Status:   Standing    Number of Occurrences:   1   Vital signs    Standing Status:   Standing    Number of Occurrences:   1   ED EKG    Standing Status:   Standing    Number of Occurrences:   1    Order Specific Question:   Reason for Exam    Answer:   Chest Pain   EKG 12-Lead    Standing Status:   Standing    Number of Occurrences:   1   Discharge patient    Order Specific Question:   Discharge disposition    Answer:   01-Home or Self Care [1]    Order Specific Question:   Discharge patient date    Answer:   07/15/2021   No orders of the defined types were placed in this encounter.  Assessment and Plan   1. Abdominal pain in pregnancy, second trimester   2. Palpitations   3. Abdominal pain in pregnancy   4. [redacted] weeks gestation of pregnancy    Allergies as of 07/15/2021       Reactions   Tylenol [acetaminophen] Anaphylaxis   Aspirin Other (See Comments)   Unknown childhood rxn   Benadryl [diphenhydramine Hcl] Itching   Buspirone    Other reaction(s): migraines   Citalopram Hydrobromide    Other reaction(s): agitation and dizziness   Dilaudid [hydromorphone Hcl] Itching   Pill only   Effexor Xr [venlafaxine]    Other reaction(s): More depressed (11/2017-neurology)   Penicillins    hives   Prednisone    Swelling, hives   Sulfamethoxazole-trimethoprim    Other reaction(s): swelling (2017)   Zoloft [sertraline]    Other reaction(s): nausea and swelling   Latex Rash        Medication List     TAKE these medications    Botox 200 units Solr Generic drug: Botulinum Toxin Type A Inject 155 Units as directed every 3 (three) months. Inject 155units to head and neck intramuscularly every 3 months.   gabapentin 100 MG capsule Commonly known as: NEURONTIN Take 2 capsules (200 mg total) by mouth at bedtime. What changed:   when to take this reasons to take this   loratadine 10 MG tablet Commonly known as: CLARITIN Take 10 mg by mouth daily as needed for allergies.   meclizine 25 MG tablet Commonly known as: ANTIVERT Take 1 tablet (25 mg total) by mouth 3 (three) times daily as needed for dizziness.   ondansetron 4 MG disintegrating tablet Commonly known as: Zofran ODT Take 1 tablet (4 mg total) by mouth every 8 (eight) hours as needed for nausea.   prenatal multivitamin Tabs tablet Take 1 tablet by mouth daily at 12 noon.       -return MAU precautions given -pt discharged to home in stable condition  [name] E Suhaylah Wampole 07/15/2021, 7:03 AM

## 2021-07-15 NOTE — MAU Note (Signed)
Palpitations since March- was evaluated at [facility] tonight.  Low abd pain x 1 week.  Getting painful to walk or get out of bed.  No bleeding.  No leaking.

## 2021-07-16 LAB — CULTURE, OB URINE: Special Requests: NORMAL

## 2021-07-22 ENCOUNTER — Other Ambulatory Visit: Payer: Self-pay

## 2021-07-22 ENCOUNTER — Encounter (HOSPITAL_COMMUNITY): Payer: Self-pay | Admitting: Obstetrics and Gynecology

## 2021-07-22 ENCOUNTER — Inpatient Hospital Stay (HOSPITAL_BASED_OUTPATIENT_CLINIC_OR_DEPARTMENT_OTHER): Payer: BC Managed Care – PPO

## 2021-07-22 ENCOUNTER — Inpatient Hospital Stay (HOSPITAL_COMMUNITY)
Admission: AD | Admit: 2021-07-22 | Discharge: 2021-07-22 | Disposition: A | Discharge status: Home / Self Care | Payer: BC Managed Care – PPO | Attending: Obstetrics and Gynecology | Admitting: Obstetrics and Gynecology

## 2021-07-22 DIAGNOSIS — Z886 Allergy status to analgesic agent status: Secondary | ICD-10-CM | POA: Insufficient documentation

## 2021-07-22 DIAGNOSIS — R0600 Dyspnea, unspecified: Secondary | ICD-10-CM | POA: Insufficient documentation

## 2021-07-22 DIAGNOSIS — Z881 Allergy status to other antibiotic agents status: Secondary | ICD-10-CM | POA: Diagnosis not present

## 2021-07-22 DIAGNOSIS — O209 Hemorrhage in early pregnancy, unspecified: Secondary | ICD-10-CM | POA: Diagnosis not present

## 2021-07-22 DIAGNOSIS — Z888 Allergy status to other drugs, medicaments and biological substances status: Secondary | ICD-10-CM | POA: Insufficient documentation

## 2021-07-22 DIAGNOSIS — O99891 Other specified diseases and conditions complicating pregnancy: Secondary | ICD-10-CM | POA: Insufficient documentation

## 2021-07-22 DIAGNOSIS — R197 Diarrhea, unspecified: Secondary | ICD-10-CM | POA: Diagnosis not present

## 2021-07-22 DIAGNOSIS — Z885 Allergy status to narcotic agent status: Secondary | ICD-10-CM | POA: Insufficient documentation

## 2021-07-22 DIAGNOSIS — O26892 Other specified pregnancy related conditions, second trimester: Secondary | ICD-10-CM | POA: Insufficient documentation

## 2021-07-22 DIAGNOSIS — Z3A16 16 weeks gestation of pregnancy: Secondary | ICD-10-CM | POA: Diagnosis not present

## 2021-07-22 DIAGNOSIS — U071 COVID-19: Secondary | ICD-10-CM | POA: Diagnosis not present

## 2021-07-22 DIAGNOSIS — Z8349 Family history of other endocrine, nutritional and metabolic diseases: Secondary | ICD-10-CM | POA: Diagnosis not present

## 2021-07-22 DIAGNOSIS — Z8249 Family history of ischemic heart disease and other diseases of the circulatory system: Secondary | ICD-10-CM | POA: Diagnosis not present

## 2021-07-22 DIAGNOSIS — O98512 Other viral diseases complicating pregnancy, second trimester: Secondary | ICD-10-CM | POA: Diagnosis not present

## 2021-07-22 DIAGNOSIS — O4692 Antepartum hemorrhage, unspecified, second trimester: Secondary | ICD-10-CM

## 2021-07-22 DIAGNOSIS — Z880 Allergy status to penicillin: Secondary | ICD-10-CM | POA: Insufficient documentation

## 2021-07-22 DIAGNOSIS — R059 Cough, unspecified: Secondary | ICD-10-CM | POA: Insufficient documentation

## 2021-07-22 DIAGNOSIS — O321XX0 Maternal care for breech presentation, not applicable or unspecified: Secondary | ICD-10-CM | POA: Diagnosis not present

## 2021-07-22 DIAGNOSIS — N939 Abnormal uterine and vaginal bleeding, unspecified: Secondary | ICD-10-CM

## 2021-07-22 DIAGNOSIS — O34219 Maternal care for unspecified type scar from previous cesarean delivery: Secondary | ICD-10-CM | POA: Diagnosis not present

## 2021-07-22 LAB — URINALYSIS, ROUTINE W REFLEX MICROSCOPIC
Bilirubin Urine: NEGATIVE
Glucose, UA: NEGATIVE mg/dL
Hgb urine dipstick: NEGATIVE
Ketones, ur: NEGATIVE mg/dL
Leukocytes,Ua: NEGATIVE
Nitrite: NEGATIVE
Protein, ur: NEGATIVE mg/dL
Specific Gravity, Urine: 1.018 (ref 1.005–1.030)
pH: 5 (ref 5.0–8.0)

## 2021-07-22 IMAGING — US US MFM OB LIMITED
1 series · 15 of 28 positions shown · non-contrast
Comparison: none

[Series 1: us mfm ob limited · 15 of 47 slices shown]
[im 1/47]
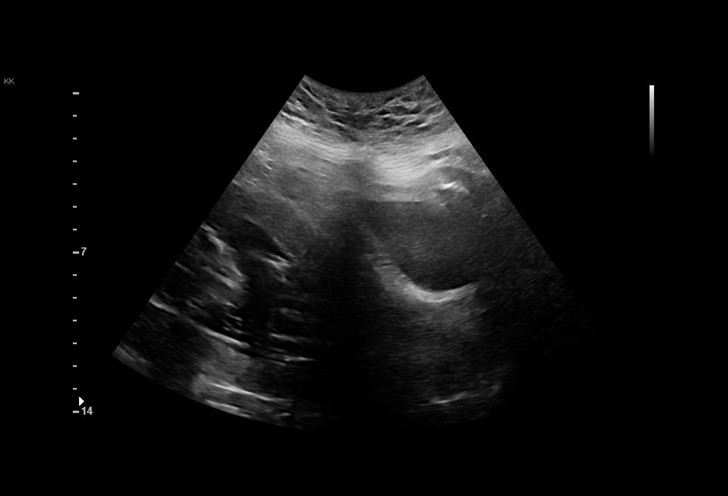
[im 4/47]
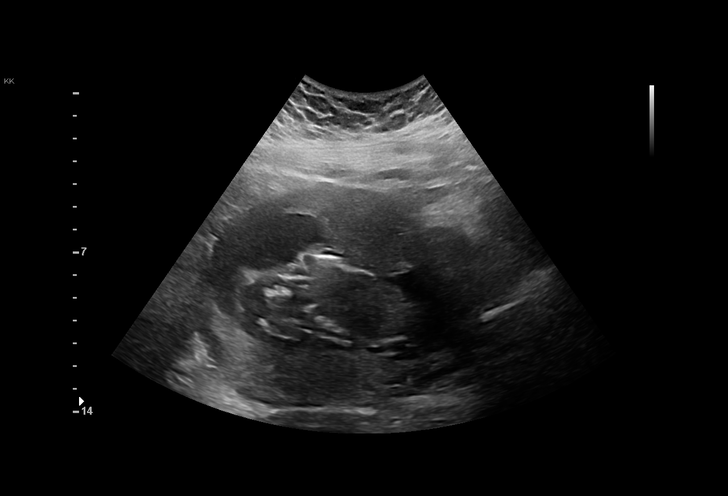
[im 7/47]
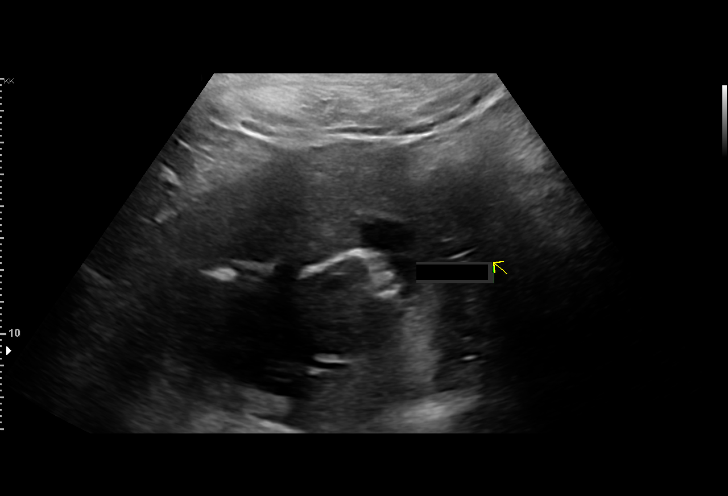
[im 11/47]
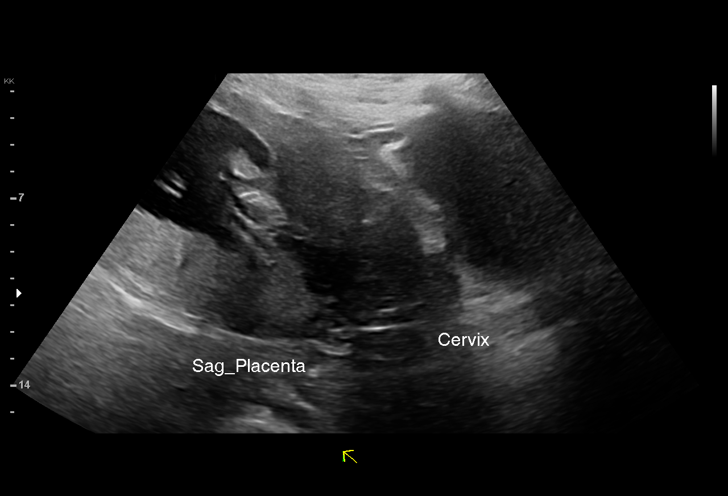
[im 14/47]
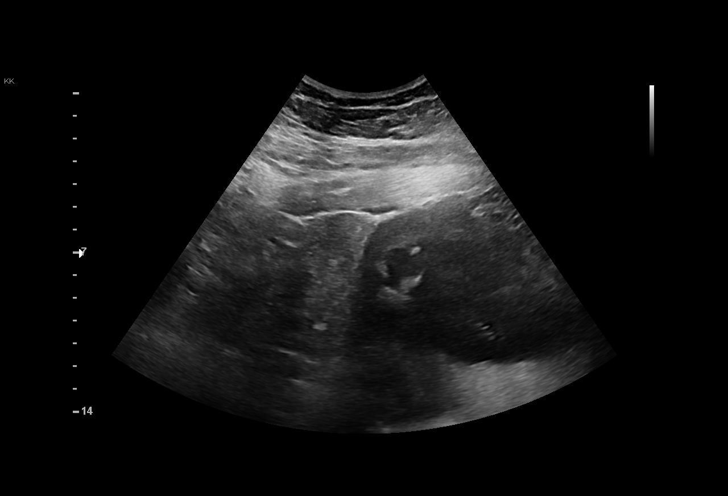
[im 18/47]
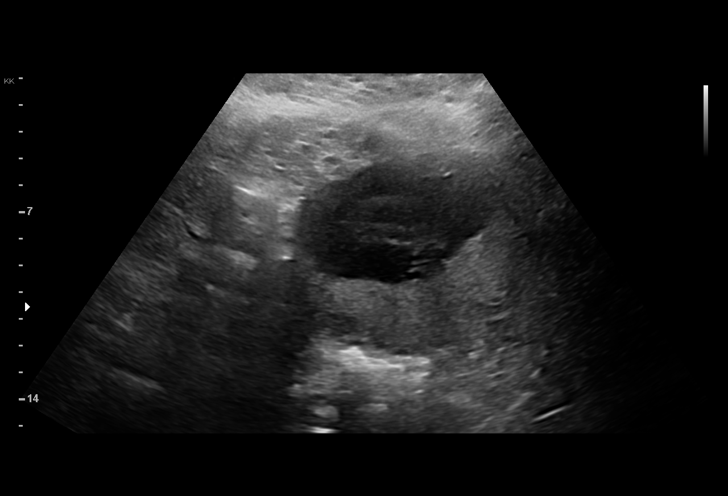
[im 21/47]
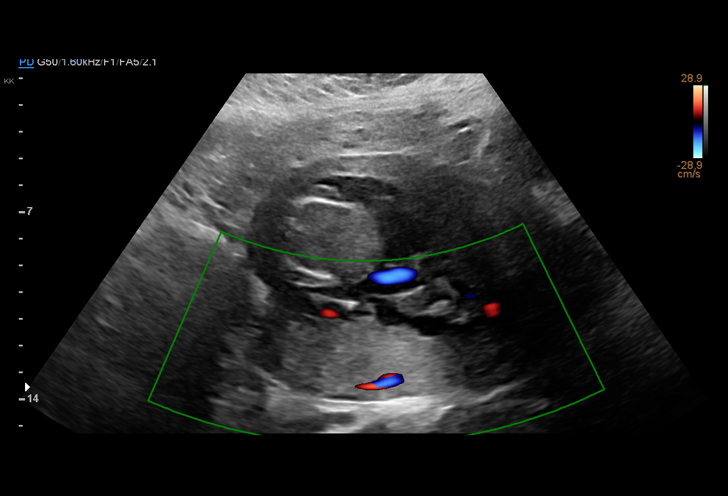
[im 24/47]
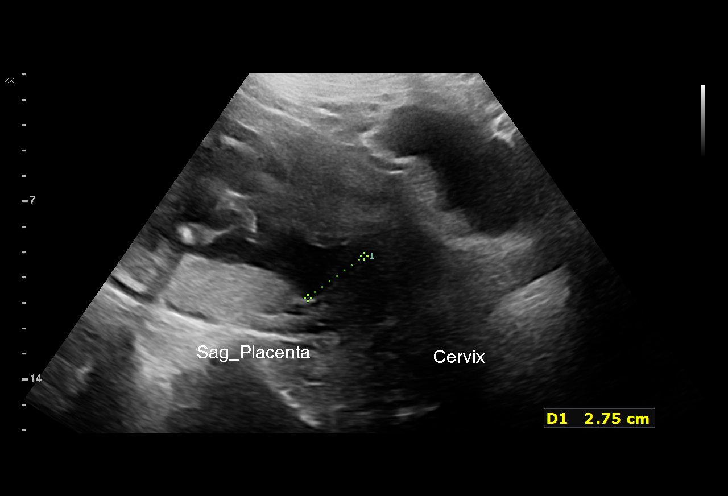
[im 26/47]
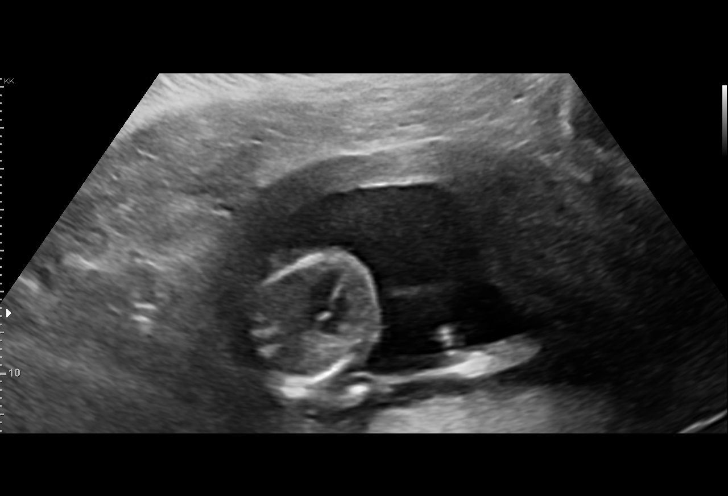
[im 29/47]
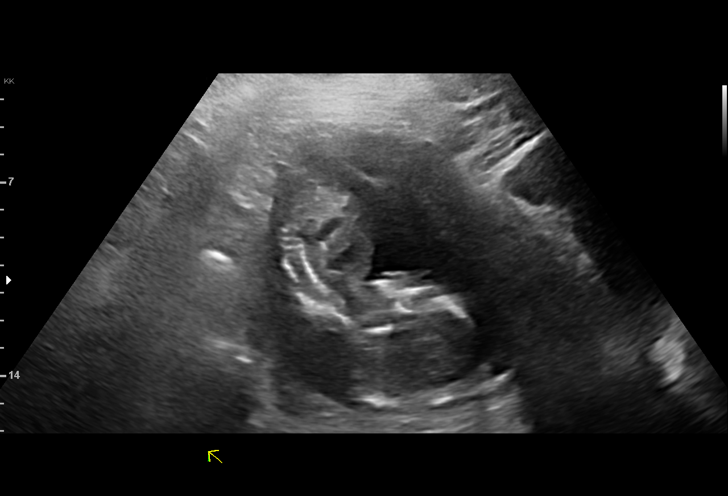
[im 33/47]
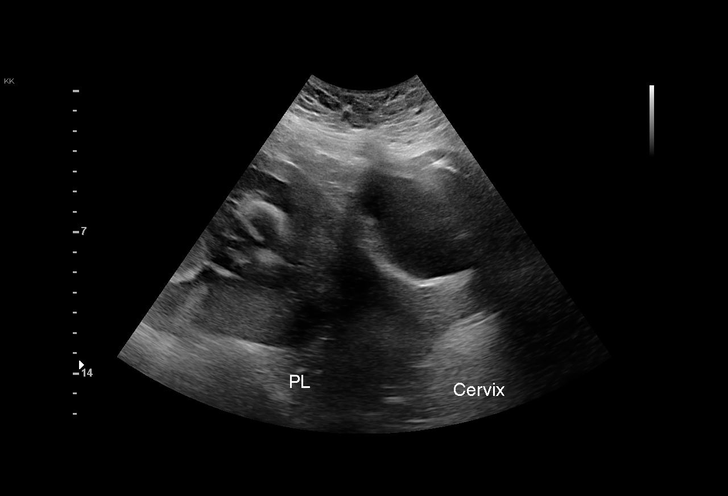
[im 36/47]
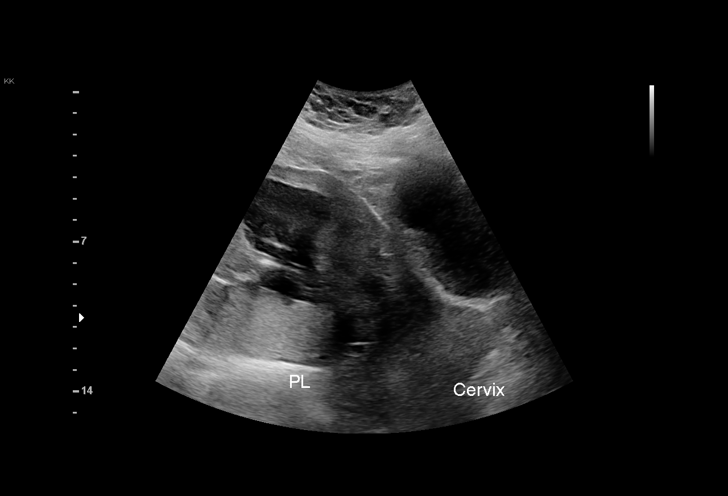
[im 40/47]
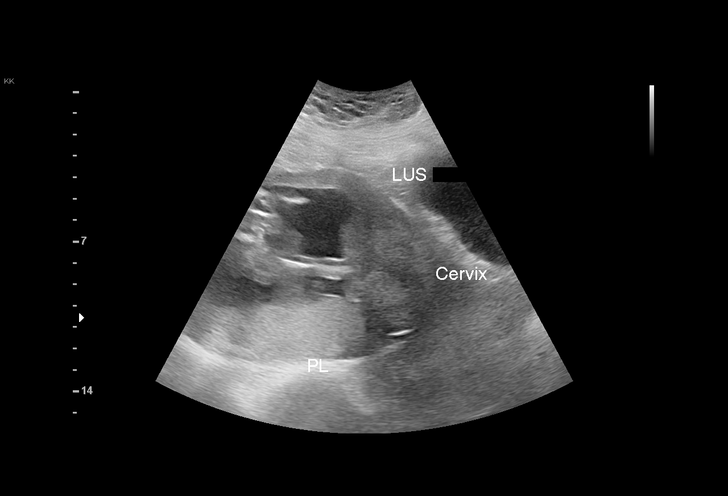
[im 43/47]
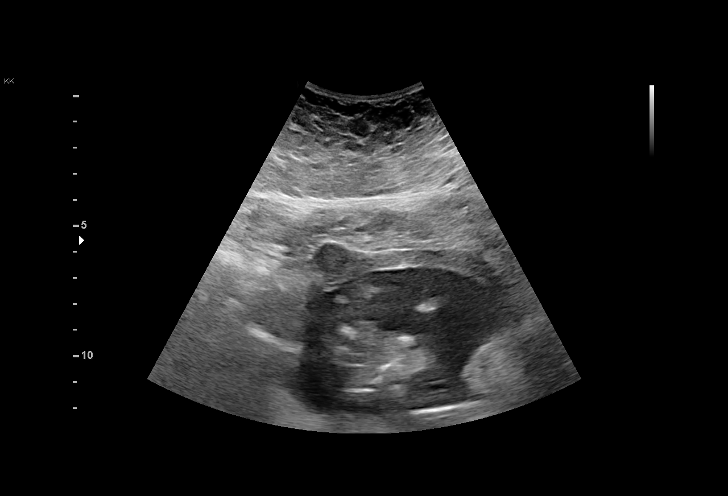
[im 47/47]
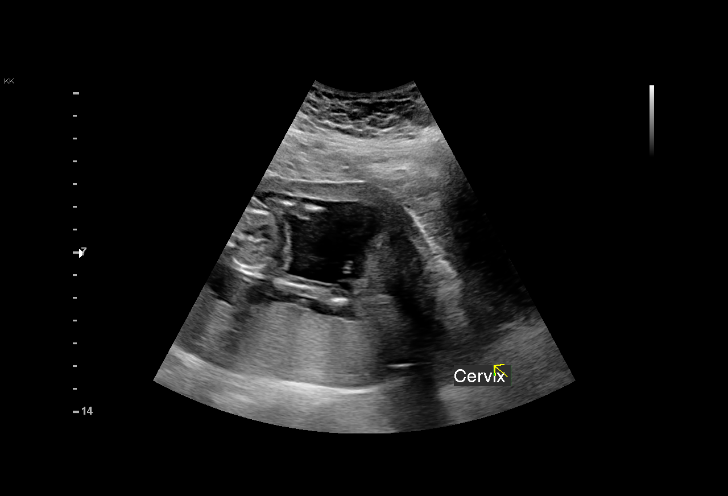

[15 of 28 positions shown; findings below may reference images not displayed]

Indications

 Pelvic pain affecting pregnancy in second      [11]
 trimester(Cramping Low Pelvic)
 16 weeks gestation of pregnancy
 Vaginal bleeding in pregnancy, second          [11]
 trimester
 History of cesarean delivery, currently        [11]
 pregnant
Fetal Evaluation

 Num Of Fetuses:         1
 Fetal Heart Rate(bpm):  148
 Cardiac Activity:       Observed
 Presentation:           Breech
 Placenta:               Posterior visualized
 P. Cord Insertion:      Not well visualized

 Amniotic Fluid
 AFI FV:      Within normal limits

                             Largest Pocket(cm)
                             5.

 Comment:    No placental abruption identified. Due to a persistent LUS
             contraction on today's exam the posterior placenta/cervical
             relationship could not be assessed.
OB History

 Gravidity:    3         Term:   1        Prem:   0        SAB:   0
 TOP:          0       Ectopic:  1        Living: 1
Gestational Age

 LMP:           16w 6d        Date:  [DATE]                 EDD:   [DATE]
 Best:          16w 6d     Det. By:  LMP  ([DATE])          EDD:   [DATE]
Anatomy

 Stomach:               Visualized             Bladder:                Visualized
Cervix Uterus Adnexa

 Cervix
 Length:           3.84  cm.
 Normal appearance by transabdominal scan.

 Adnexa
 No abnormality visualized.
Comments

 This patient presented to the [facility] due to vaginal bleeding.

 A limited ultrasound performed today shows that the fetus is
 in the breech presentation.

 There was normal amniotic fluid noted.

 A possible low-lying placenta was noted on today's exam.

 The patient is already scheduled for a detailed fetal anatomy
 scan in the [HOSPITAL] on [DATE].

## 2021-07-22 MED ORDER — BENZONATATE 200 MG PO CAPS: 200.0000 mg | ORAL_CAPSULE | Freq: Four times a day (QID) | ORAL | 1 refills | Status: DC | PRN

## 2021-07-22 MED ORDER — BENZONATATE 100 MG PO CAPS
200.0000 mg | ORAL_CAPSULE | Freq: Once | ORAL | Status: AC
  Administered 2021-07-22: 200 mg via ORAL
  Filled 2021-07-22: qty 2

## 2021-07-22 MED ORDER — NIRMATRELVIR/RITONAVIR (PAXLOVID)TABLET: 3.0000 | ORAL_TABLET | Freq: Two times a day (BID) | ORAL | 0 refills | Status: AC

## 2021-07-22 NOTE — MAU Note (Signed)
Debbie [name] is a 40 y.o. at 68w6dhere in MAU reporting: yesterday noticed a large drop of blood in the toilet and today it is a little bit less. Also having some cramping.  + home covid test on sunday  Onset of complaint: yesterday  Pain score: 5/10  Vitals:   07/22/21 1333  BP: 108/75  Pulse: 94  Resp: 16  Temp: 98.3 F (36.8 C)  SpO2: 97%     FHT:162  Lab orders placed from triage: UA

## 2021-07-22 NOTE — MAU Provider Note (Addendum)
History     CSN: [id number]  Arrival date and time: 07/22/21 1304      Chief Complaint  Patient presents with   Abdominal Pain   Vaginal Bleeding   HPI This is a 40 year old G3 P1-0-1-1 at 16 weeks and 6 days.  Patient is here for evaluation of bleeding that started yesterday.  After the initial bleeding episode, it has been improving and she currently has very little bleeding - mostly just after she coughs or sneezes. She has been having increased cramping in the lower quadrants bilaterally and has been taking NSAIDs to improve her pain.  She tested positive for COVID yesterday - started having cough, nausea, vomiting, diarrhea. The GI symptoms are improving, but still having cough. Mild dyspnea.   OB History     Gravida  3   Para  1   Term  1   Preterm      AB  1   Living  1      SAB  0   IAB      Ectopic  1   Multiple      Live Births  1           Past Medical History:  Diagnosis Date   Anxiety    self reported   Depression    self reported   Maxillary sinus cyst    left   Migraine    Multiple thyroid nodules    3; 1.9 cm and 2 cm, 0.6 cm, will see general  surgeon   No pertinent past medical history     Past Surgical History:  Procedure Laterality Date   CESAREAN SECTION  2013   laparscopy     SHOULDER CAPSULORRHAPHY      Family History  Problem Relation Age of Onset   Heart disease Maternal Grandfather    Cerebral aneurysm Sister    Migraines Sister    Seizures Sister    Heart disease Other        mother's side   High Cholesterol Other        mother's side     Social History   Tobacco Use   Smoking status: Never   Smokeless tobacco: Never  Vaping Use   Vaping Use: Never used  Substance Use Topics   Alcohol use: No   Drug use: No    Allergies:  Allergies  Allergen Reactions   Tylenol [Acetaminophen] Anaphylaxis   Aspirin Other (See Comments)    Unknown childhood rxn   Benadryl [Diphenhydramine Hcl] Itching    Buspirone     Other reaction(s): migraines   Citalopram Hydrobromide     Other reaction(s): agitation and dizziness   Dilaudid [Hydromorphone Hcl] Itching    Pill only   Effexor Xr [Venlafaxine]     Other reaction(s): More depressed (11/2017-neurology)   Penicillins     hives   Prednisone     Swelling, hives   Sulfamethoxazole-Trimethoprim     Other reaction(s): swelling (2017)   Zoloft [Sertraline]     Other reaction(s): nausea and swelling   Latex Rash    Medications Prior to Admission  Medication Sig Dispense Refill Last Dose   loratadine (CLARITIN) 10 MG tablet Take 10 mg by mouth daily as needed for allergies.   07/21/2021   ondansetron (ZOFRAN ODT) 4 MG disintegrating tablet Take 1 tablet (4 mg total) by mouth every 8 (eight) hours as needed for nausea. 10 tablet 0 07/21/2021   Prenatal Vit-Fe Fumarate-FA (PRENATAL MULTIVITAMIN) TABS  tablet Take 1 tablet by mouth daily at 12 noon.   07/21/2021   Botulinum Toxin Type A (BOTOX) 200 units SOLR Inject 155 Units as directed every 3 (three) months. Inject 155units to head and neck intramuscularly every 3 months. 1 each 3    gabapentin (NEURONTIN) 100 MG capsule Take 2 capsules (200 mg total) by mouth at bedtime. (Patient taking differently: Take 200 mg by mouth at bedtime as needed (joint pain).) 180 capsule 0 More than a month   meclizine (ANTIVERT) 25 MG tablet Take 1 tablet (25 mg total) by mouth 3 (three) times daily as needed for dizziness. (Patient not taking: No sig reported) 30 tablet 0 More than a month    Review of Systems Physical Exam   Blood pressure 108/75, pulse 94, temperature 98.3 F (36.8 C), temperature source Oral, resp. rate 16, height '5\' 3"'$  (1.6 m), weight 104.2 kg, last menstrual period 03/26/2021, SpO2 97 %, unknown if currently breastfeeding.  Physical Exam Vitals and nursing note reviewed.  Constitutional:      Appearance: She is well-developed.  HENT:     Head: Normocephalic and atraumatic.   Cardiovascular:     Rate and Rhythm: Normal rate and regular rhythm.  Pulmonary:     Effort: Pulmonary effort is normal.     Breath sounds: Normal breath sounds.  Abdominal:     General: Abdomen is flat.     Palpations: Abdomen is soft.     Tenderness: There is abdominal tenderness in the right lower quadrant and left lower quadrant.  Neurological:     Mental Status: She is alert.   Results for orders placed or performed during the hospital encounter of 07/22/21 (from the past 24 hour(s))  Urinalysis, Routine w reflex microscopic     Status: Abnormal   Collection Time: 07/22/21  2:53 PM  Result Value Ref Range   Color, Urine YELLOW YELLOW   APPearance HAZY (A) CLEAR   Specific Gravity, Urine 1.018 1.005 - 1.030   pH 5.0 5.0 - 8.0   Glucose, UA NEGATIVE NEGATIVE mg/dL   Hgb urine dipstick NEGATIVE NEGATIVE   Bilirubin Urine NEGATIVE NEGATIVE   Ketones, ur NEGATIVE NEGATIVE mg/dL   Protein, ur NEGATIVE NEGATIVE mg/dL   Nitrite NEGATIVE NEGATIVE   Leukocytes,Ua NEGATIVE NEGATIVE     MAU Course  Procedures Imaging: I independent reviewed the images of the [place]. No abruption identified. Unable to clearly see relationship between cervix and placenta.   MDM   Assessment and Plan   1. [redacted] weeks gestation of pregnancy   2. Vaginal bleeding   3. COVID-19   4. Vaginal bleeding in pregnancy, second trimester    Discharge to home. Offered Paxlovid to patient - which she was willing to take. Return precautions given.   Truett Mainland 07/22/2021, 2:37 PM

## 2021-08-06 NOTE — Progress Notes (Signed)
Debbie Debbie [name] [place] Phone: [phone] Subjective:   Debbie Debbie [name], am serving as a scribe for Dr. Hulan Debbie [name]. This visit occurred during the SARS-CoV-2 public health emergency.  Safety protocols were in place, including screening questions prior to the visit, additional usage of staff PPE, and extensive cleaning of exam room while observing appropriate contact time as indicated for disinfecting solutions.   I'm seeing this patient by the request  of:  Debbie Cha, MD  CC: Back and neck pain follow-up  [id number]  Debbie Debbie [name] is a 40 y.o. female coming in with complaint of back and neck pain. OMT 07/10/2021.  Patient states that dry needling which is helpful to decrease her pain.  Patient states that this is helped the right side of the pain of the back and the hip significantly.  Patient has been able to start increasing activity.  Not having as much difficulty with the pelvic floor aspect either at the moment.  Happy with making improvement. Medications patient has been prescribed: None  Taking:        Past Medical History:  Diagnosis Date   Anxiety    self reported   Depression    self reported   Maxillary sinus cyst    left   Migraine    Multiple thyroid nodules    3; 1.9 cm and 2 cm, 0.6 cm, will see general  surgeon   Debbie [name] pertinent past medical history     Allergies  Allergen Reactions   Tylenol [Acetaminophen] Anaphylaxis   Aspirin Other (See Comments)    Unknown childhood rxn   Benadryl [Diphenhydramine Hcl] Itching   Buspirone     Other reaction(s): migraines   Citalopram Hydrobromide     Other reaction(s): agitation and dizziness   Dilaudid [Hydromorphone Hcl] Itching    Pill only   Effexor Xr [Venlafaxine]     Other reaction(s): More depressed (11/2017-neurology)   Penicillins     hives   Prednisone     Swelling, hives   Sulfamethoxazole-Trimethoprim     Other reaction(s):  swelling (2017)   Zoloft [Sertraline]     Other reaction(s): nausea and swelling   Latex Rash     Review of Systems:  Debbie [name] headache, visual changes, nausea, vomiting, diarrhea, constipation, dizziness, abdominal pain, skin rash, fevers, chills, night sweats, weight loss, swollen lymph nodes, body aches, joint swelling, chest pain, shortness of breath, mood changes. POSITIVE muscle aches  Objective  Blood pressure 112/66, pulse 99, height '5\' 3"'$  (1.6 m), weight 236 lb (107 kg), last menstrual period 03/26/2021, SpO2 99 %, unknown if currently breastfeeding.   General: Debbie [name] apparent distress alert and oriented x3 mood and affect normal, dressed appropriately.  Overweight and patient is gravid HEENT: Pupils equal, extraocular movements intact  Respiratory: Patient's speak in full sentences and does not appear short of breath  Cardiovascular: Debbie [name] lower extremity edema, non tender, Debbie [name] erythema  Low back exam actually does have some mild increase in lordosis.  Patient does still have tightness with FABER test right greater than left.  Negative straight leg test.  Osteopathic findings  C2 flexed rotated and side bent right C7 flexed rotated and side bent left T3 extended rotated and side bent right inhaled rib T9 extended rotated and side bent left L2 flexed rotated and side bent right Sacrum right on right       Assessment and Plan:  Degenerative disc disease, lumbar Known degenerative disc disease but  patient is also pregnant.  Do feel that the progesterone can be contributing as well.  Patient now is making progress already with dry needling and physical therapy.  Responding well to manipulation.  Increase activity as tolerated.  Follow-up with me again in 4 to 6 weeks.   Nonallopathic problems  Decision today to treat with OMT was based on Physical Exam  After verbal consent patient was treated with HVLA, ME, FPR techniques in cervical, rib, thoracic, lumbar, and sacral   areas  Patient tolerated the procedure well with improvement in symptoms  Patient given exercises, stretches and lifestyle modifications  See medications in patient instructions if given  Patient will follow up in 4-8 weeks      The above documentation has been reviewed and is accurate and complete Debbie Pulley, DO        Note: This dictation was prepared with Dragon dictation along with smaller phrase technology. Any transcriptional errors that result from this process are unintentional.

## 2021-08-07 ENCOUNTER — Ambulatory Visit: Payer: BC Managed Care – PPO | Admitting: *Deleted

## 2021-08-07 ENCOUNTER — Other Ambulatory Visit: Payer: Self-pay | Admitting: *Deleted

## 2021-08-07 ENCOUNTER — Ambulatory Visit: Payer: BC Managed Care – PPO | Attending: Maternal & Fetal Medicine | Admitting: Maternal & Fetal Medicine

## 2021-08-07 ENCOUNTER — Ambulatory Visit: Payer: BC Managed Care – PPO | Attending: Obstetrics and Gynecology

## 2021-08-07 ENCOUNTER — Other Ambulatory Visit: Payer: Self-pay

## 2021-08-07 ENCOUNTER — Encounter: Payer: Self-pay | Admitting: *Deleted

## 2021-08-07 VITALS — BP 113/73 | HR 90

## 2021-08-07 DIAGNOSIS — Z6841 Body Mass Index (BMI) 40.0 and over, adult: Secondary | ICD-10-CM

## 2021-08-07 DIAGNOSIS — O99352 Diseases of the nervous system complicating pregnancy, second trimester: Secondary | ICD-10-CM | POA: Diagnosis not present

## 2021-08-07 DIAGNOSIS — O358XX0 Maternal care for other (suspected) fetal abnormality and damage, not applicable or unspecified: Secondary | ICD-10-CM

## 2021-08-07 DIAGNOSIS — Z362 Encounter for other antenatal screening follow-up: Secondary | ICD-10-CM

## 2021-08-07 DIAGNOSIS — O09522 Supervision of elderly multigravida, second trimester: Secondary | ICD-10-CM | POA: Insufficient documentation

## 2021-08-07 DIAGNOSIS — G40909 Epilepsy, unspecified, not intractable, without status epilepticus: Secondary | ICD-10-CM | POA: Diagnosis not present

## 2021-08-07 DIAGNOSIS — O35EXX0 Maternal care for other (suspected) fetal abnormality and damage, fetal genitourinary anomalies, not applicable or unspecified: Secondary | ICD-10-CM

## 2021-08-07 DIAGNOSIS — Z3A19 19 weeks gestation of pregnancy: Secondary | ICD-10-CM | POA: Diagnosis not present

## 2021-08-07 DIAGNOSIS — Z363 Encounter for antenatal screening for malformations: Secondary | ICD-10-CM | POA: Diagnosis present

## 2021-08-07 IMAGING — US US MFM OB DETAIL+14 WK
1 series · 12 of 28 positions shown · non-contrast
Comparison: none

[Series 1: us mfm ob detail+14 wk · 12 of 73 slices shown]
[im 3/73]
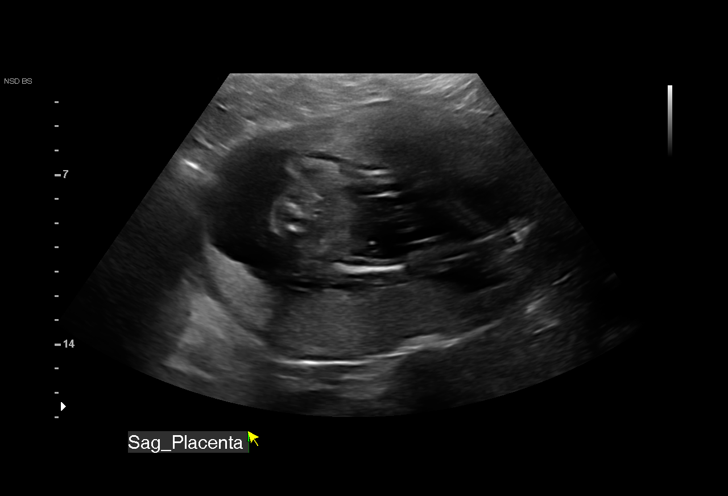
[im 9/73]
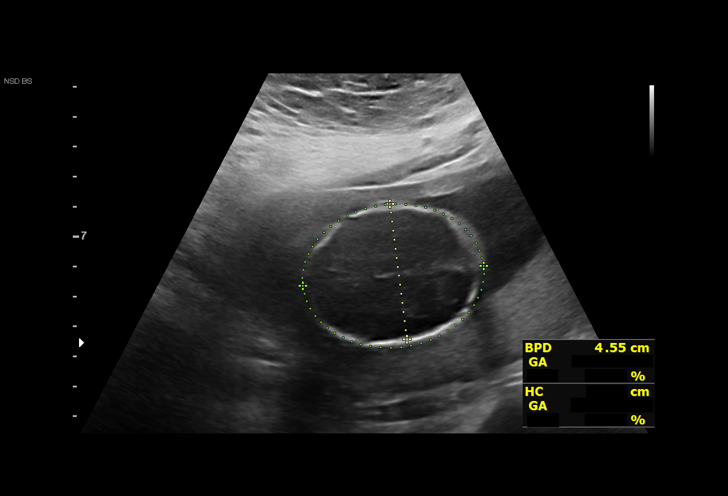
[im 14/73]
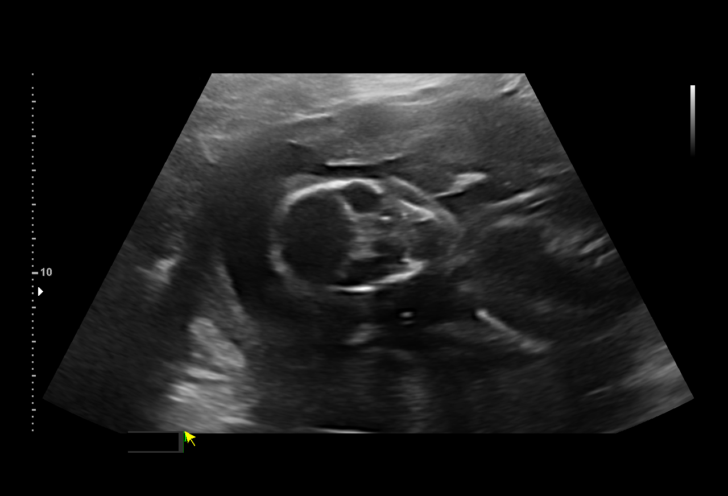
[im 22/73]
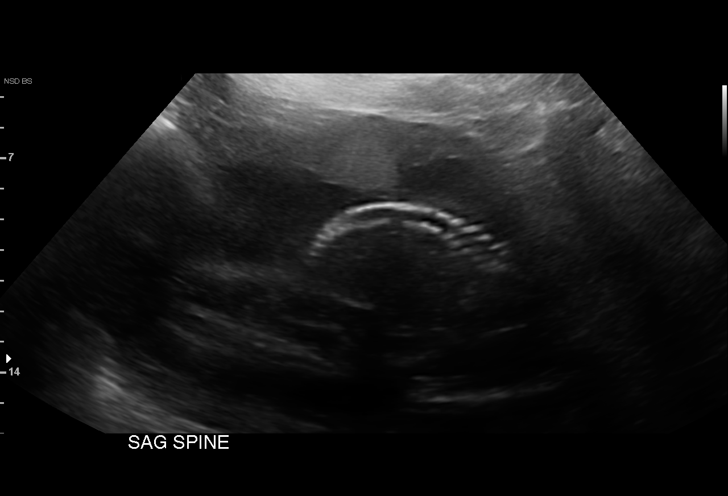
[im 27/73]
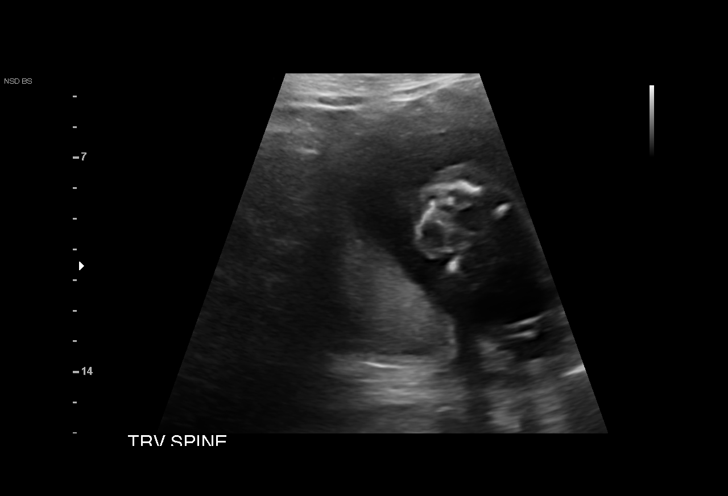
[im 33/73]
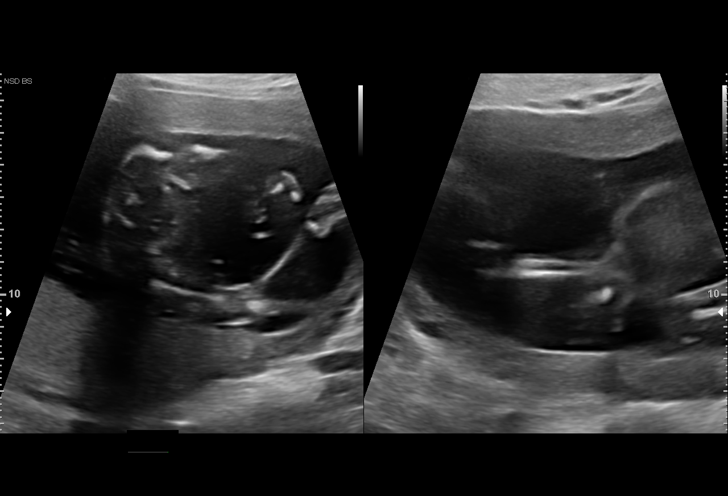
[im 41/73]
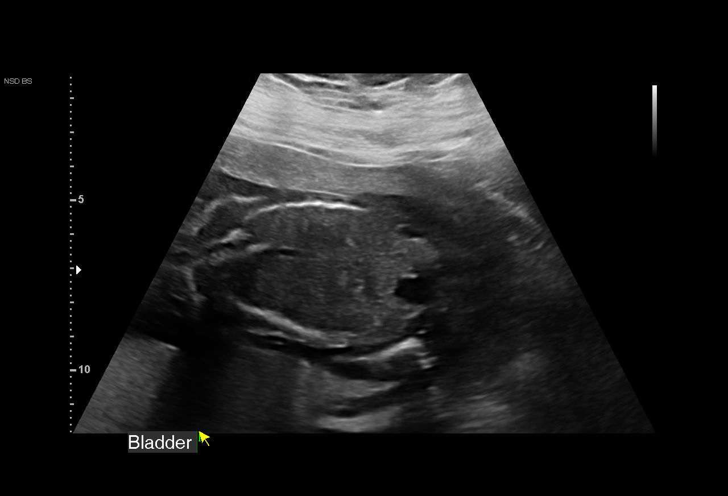
[im 46/73]
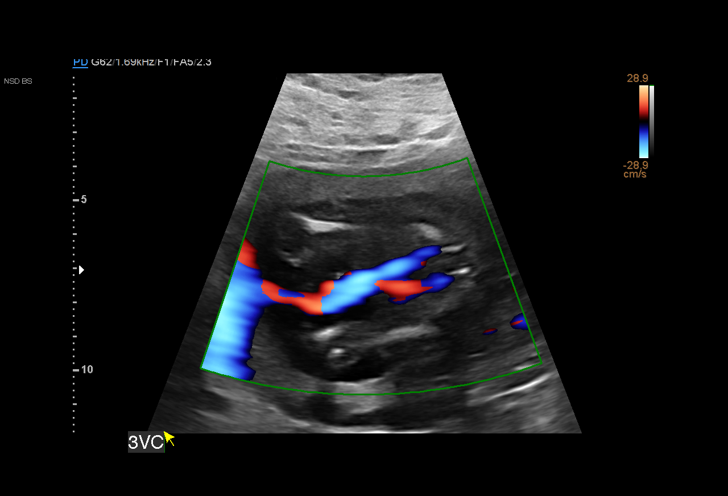
[im 51/73]
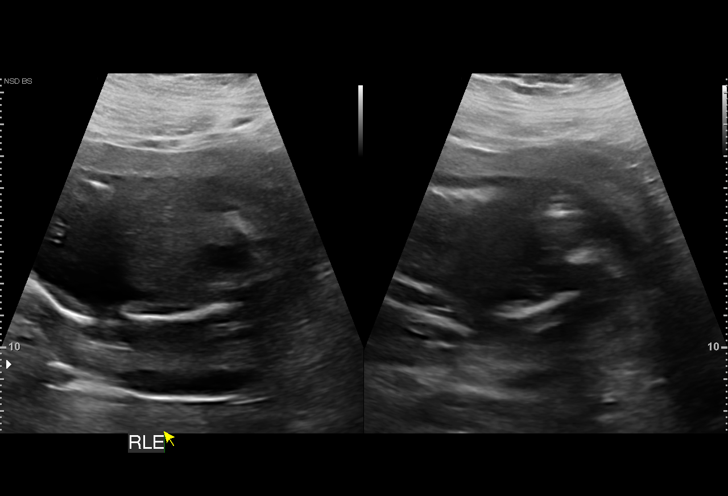
[im 59/73]
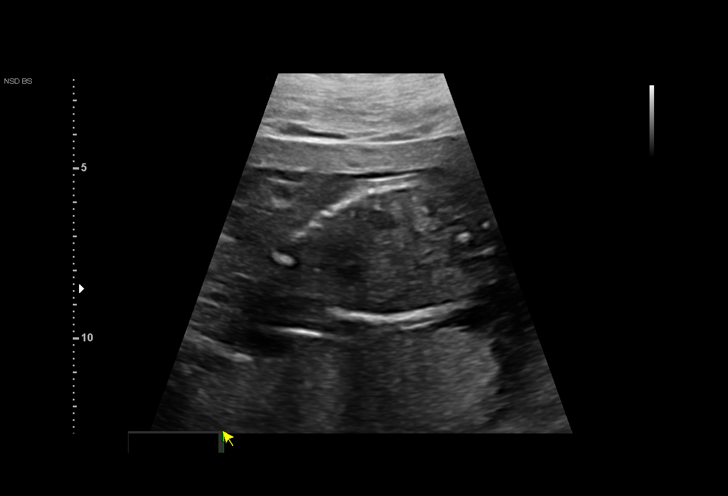
[im 65/73]
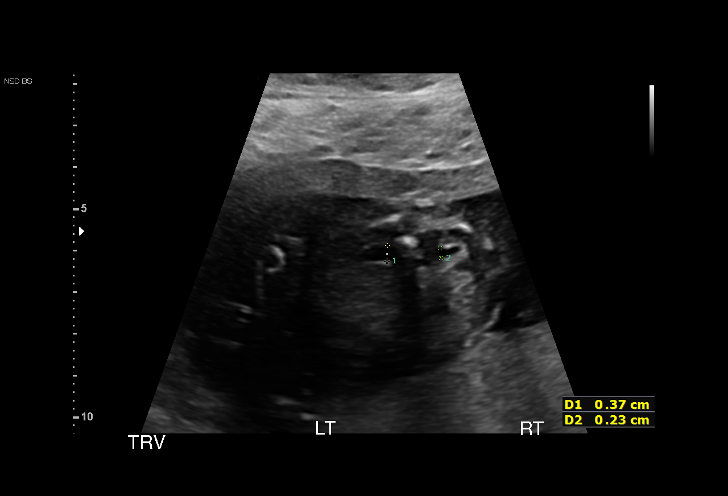
[im 70/73]
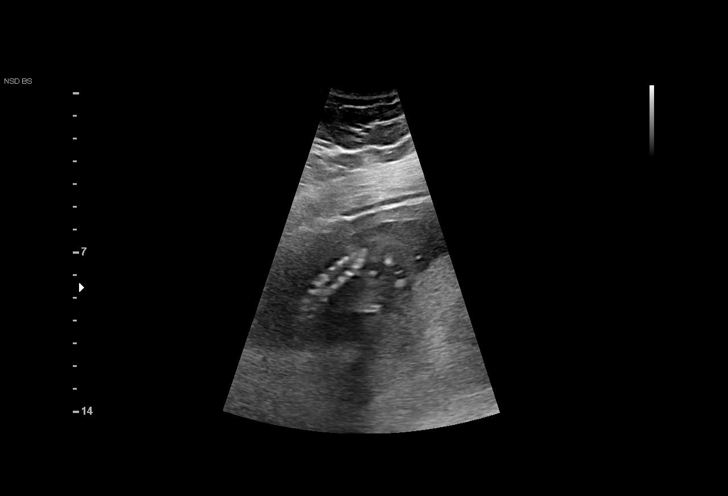

[12 of 28 positions shown; findings below may reference images not displayed]

[REDACTED]
                                                            Ave[address] [HOSPITAL]

Indications

 Advanced maternal age multigravida 35+,        [4M]
 second trimester (40 yo)
 19 weeks gestation of pregnancy
 Encounter for antenatal screening for          [4M]
 malformations
 Elevated blood pressure affecting pregnancy    [4M]
 in second trimester (131/95 in ED)
 Previous cesarean delivery, antepartum         [4M]
 (Breech)
 Obesity complicating pregnancy, second         [4M]
 trimester (BMI 40)
 Medical complication of pregnancy (Covid +     [4M]
 in pregnancy)
 Seizure disorder (Cervical bone spur           [4M] [4M]
 affecting vagus nerve)
Fetal Evaluation

 Num Of Fetuses:         1
 Fetal Heart Rate(bpm):  142
 Cardiac Activity:       Observed
 Presentation:           Breech
 Placenta:               Posterior
 P. Cord Insertion:      Visualized
 Amniotic Fluid
 AFI FV:      Within normal limits

                             Largest Pocket(cm)

Biometry

 BPD:      45.3  mm     G. Age:  19w 5d         74  %    CI:        70.15   %    70 - 86
                                                         FL/HC:      17.9   %    16.1 -
 HC:      172.5  mm     G. Age:  19w 6d         74  %    HC/AC:      1.29        1.09 -
 AC:      133.3  mm     G. Age:  18w 6d         35  %    FL/BPD:     68.0   %
 FL:       30.8  mm     G. Age:  19w 4d         58  %    FL/AC:      23.1   %    20 - 24
 HUM:      29.4  mm     G. Age:  19w 4d         63  %
 CER:      20.7  mm     G. Age:  19w 6d         72  %
 NFT:       3.9  mm
 LV:        7.6  mm
 CM:        5.6  mm

 Est. FW:     281  gm    0 lb 10 oz      51  %
OB History

 Gravidity:    3         Term:   1        Prem:   0        SAB:   0
 TOP:          0       Ectopic:  1        Living: 1
Gestational Age

 LMP:           19w 1d        Date:  [DATE]                 EDD:   [DATE]
 U/S Today:     19w 4d                                        EDD:   [DATE]
 Best:          19w 1d     Det. By:  LMP  ([DATE])          EDD:   [DATE]
Anatomy

 Cranium:               Appears normal         Aortic Arch:            Not well visualized
 Cavum:                 Appears normal         Ductal Arch:            Not well visualized
 Ventricles:            Appears normal         Diaphragm:              Appears normal
 Choroid Plexus:        Appears normal         Stomach:                Appears normal, left
                                                                       sided
 Cerebellum:            Appears normal         Abdomen:                Appears normal
 Posterior Fossa:       Appears normal         Abdominal Wall:         Appears nml (cord
                                                                       insert, abd wall)
 Nuchal Fold:           Appears normal         Cord Vessels:           Appears normal (3
                                                                       vessel cord)
 Face:                  Orbits appear          Kidneys:                Left sided
                        normal
                                                                       pyelectasis,  4mm
 Lips:                  Appears normal         Bladder:                Appears normal
 Palate:                Not well visualized    Spine:                  Limited views
                                                                       appear normal
 Heart:                 Not well visualized    Upper Extremities:      Visualized
 RVOT:                  Not well visualized    Lower Extremities:      Visualized
 LVOT:                  Not well visualized

 Other:  Fetus appears to be a male. Technically difficult due to maternal
         habitus and fetal position.
Cervix Uterus Adnexa
 Cervix
 Length:           3.78  cm.
 Normal appearance by transabdominal scan.
Impression

 Single intrauterine pregnancy here for a detailed anatomy
 due to elevated maternal BM 40
 Normal anatomy with measurements consistent with dates
 There is good fetal movement and amniotic fluid volume
 Suboptimal views of the fetal anatomy were obtained
 secondary to fetal position.

 Ms. MERTENS had a prior cesarean delivery secondary to
 breech presentation. She plans to have a repeat cesarean
 delivery due to her bone spur in her spine effecting her vagus
 nerve.

 She had a history of seizure however, more recently they
 suspect that this is due to her bone spur/nerve interaction.
 She is able to manage her events at home. The description
 does not seem like epilepsy.

 I discussed with MERTENS finding of left unilateral renal
 pyelectasis with a measurement of 4.1 mm. I discussed the
 etiology of renal pyelectasis to include normal variant,
 ureteropelvic or vesicle junction obstruction and
 urethrovesicle reflux. Prior to 28 weeks the threshold for
 abnormal is <4mm but after 28 weeks >7 mm. The renal
 pelvis is [REDACTED] grade 1 appearance without renal caliectasis.

 Lastly, today's evaluation of the placenta is no longer low
 lying.

 I reviewed our recommendation for serial growth exams with
 weekly testing initiated at 36 weeks.
Recommendations

 Serial growth exams every 4 weeks
 Initiate weekly testing initiated at 36 weeks.

## 2021-08-07 NOTE — Telephone Encounter (Signed)
Spoke with [facility], Botox TBD 9/28 for patient's 10/3 appointment.

## 2021-08-07 NOTE — Progress Notes (Signed)
MFM Brief Note  Debbie [name] is a G3P1 at [place] 1 d here with a single intrauterine pregnancy here for a detailed anatomy due to elevated maternal BM 40.   Normal anatomy with measurements consistent with dates There is good fetal movement and amniotic fluid volume Suboptimal views of the fetal anatomy were obtained secondary to fetal position.  Debbie [name] had a prior cesarean delivery secondary to breech presentation. She plans to have a repeat cesarean delivery due to her bone spur in her spine effecting her vagus nerve.  She had a history of seizure however, more recently they suspect that this is due to her bone spur/nerve interaction. She is able to manage her events at home. The description does not seem like epilepsy.   I discussed with Debbie [name]  today's finding of left unilateral renal pyelectasis with a measurement of 4.1 mm. I discussed the etiology of renal pyelectasis to include normal variant, ureteropelvic or vesicle junction obstruction and urethrovesicle reflux. Prior to 28 weeks the threshold for abnormal is <58m but after 28 weeks >7 mm. The renal pelvis is SFU grade 1 appearance without renal caliectasis.  Lastly, today's evaluation of the placenta is no longer low lying.   I reviewed our recommendation for serial growth exams with weekly testing initiated at 36 weeks.   I spent 30 minutes with > 50% in face to face consultation.  CVikki Ports MD  All questions answered.

## 2021-08-08 ENCOUNTER — Ambulatory Visit (INDEPENDENT_AMBULATORY_CARE_PROVIDER_SITE_OTHER): Payer: BC Managed Care – PPO | Admitting: Family Medicine

## 2021-08-08 ENCOUNTER — Encounter: Payer: Self-pay | Admitting: Family Medicine

## 2021-08-08 VITALS — BP 112/66 | HR 99 | Ht 63.0 in | Wt 236.0 lb

## 2021-08-08 DIAGNOSIS — M9903 Segmental and somatic dysfunction of lumbar region: Secondary | ICD-10-CM

## 2021-08-08 DIAGNOSIS — M5136 Other intervertebral disc degeneration, lumbar region: Secondary | ICD-10-CM

## 2021-08-08 DIAGNOSIS — M9902 Segmental and somatic dysfunction of thoracic region: Secondary | ICD-10-CM

## 2021-08-08 DIAGNOSIS — M9904 Segmental and somatic dysfunction of sacral region: Secondary | ICD-10-CM | POA: Diagnosis not present

## 2021-08-08 DIAGNOSIS — M9908 Segmental and somatic dysfunction of rib cage: Secondary | ICD-10-CM

## 2021-08-08 DIAGNOSIS — M9901 Segmental and somatic dysfunction of cervical region: Secondary | ICD-10-CM | POA: Diagnosis not present

## 2021-08-08 NOTE — Assessment & Plan Note (Signed)
Known degenerative disc disease but patient is also pregnant.  Do feel that the progesterone can be contributing as well.  Patient now is making progress already with dry needling and physical therapy.  Responding well to manipulation.  Increase activity as tolerated.  Follow-up with me again in 4 to 6 weeks.

## 2021-08-08 NOTE — Patient Instructions (Signed)
Look much better See me in 4-6 weeks

## 2021-08-09 ENCOUNTER — Encounter (HOSPITAL_COMMUNITY): Payer: Self-pay

## 2021-08-09 ENCOUNTER — Inpatient Hospital Stay (HOSPITAL_COMMUNITY)
Admission: EM | Admit: 2021-08-09 | Discharge: 2021-08-09 | Disposition: A | Discharge status: Home / Self Care | Payer: BC Managed Care – PPO | Attending: Obstetrics and Gynecology | Admitting: Obstetrics and Gynecology

## 2021-08-09 ENCOUNTER — Other Ambulatory Visit: Payer: Self-pay

## 2021-08-09 DIAGNOSIS — Z3A19 19 weeks gestation of pregnancy: Secondary | ICD-10-CM | POA: Diagnosis not present

## 2021-08-09 DIAGNOSIS — Z880 Allergy status to penicillin: Secondary | ICD-10-CM | POA: Insufficient documentation

## 2021-08-09 DIAGNOSIS — O26892 Other specified pregnancy related conditions, second trimester: Secondary | ICD-10-CM | POA: Insufficient documentation

## 2021-08-09 DIAGNOSIS — Z888 Allergy status to other drugs, medicaments and biological substances status: Secondary | ICD-10-CM | POA: Insufficient documentation

## 2021-08-09 DIAGNOSIS — R200 Anesthesia of skin: Secondary | ICD-10-CM | POA: Insufficient documentation

## 2021-08-09 DIAGNOSIS — G43909 Migraine, unspecified, not intractable, without status migrainosus: Secondary | ICD-10-CM | POA: Diagnosis not present

## 2021-08-09 DIAGNOSIS — R42 Dizziness and giddiness: Secondary | ICD-10-CM | POA: Insufficient documentation

## 2021-08-09 DIAGNOSIS — O99352 Diseases of the nervous system complicating pregnancy, second trimester: Secondary | ICD-10-CM | POA: Diagnosis not present

## 2021-08-09 DIAGNOSIS — O99891 Other specified diseases and conditions complicating pregnancy: Secondary | ICD-10-CM | POA: Insufficient documentation

## 2021-08-09 DIAGNOSIS — Z886 Allergy status to analgesic agent status: Secondary | ICD-10-CM | POA: Insufficient documentation

## 2021-08-09 DIAGNOSIS — O162 Unspecified maternal hypertension, second trimester: Secondary | ICD-10-CM

## 2021-08-09 DIAGNOSIS — R519 Headache, unspecified: Secondary | ICD-10-CM | POA: Diagnosis not present

## 2021-08-09 DIAGNOSIS — Z885 Allergy status to narcotic agent status: Secondary | ICD-10-CM | POA: Diagnosis not present

## 2021-08-09 DIAGNOSIS — M542 Cervicalgia: Secondary | ICD-10-CM | POA: Diagnosis not present

## 2021-08-09 DIAGNOSIS — M549 Dorsalgia, unspecified: Secondary | ICD-10-CM | POA: Diagnosis not present

## 2021-08-09 DIAGNOSIS — R202 Paresthesia of skin: Secondary | ICD-10-CM | POA: Insufficient documentation

## 2021-08-09 DIAGNOSIS — Z881 Allergy status to other antibiotic agents status: Secondary | ICD-10-CM | POA: Diagnosis not present

## 2021-08-09 DIAGNOSIS — Z8249 Family history of ischemic heart disease and other diseases of the circulatory system: Secondary | ICD-10-CM | POA: Insufficient documentation

## 2021-08-09 DIAGNOSIS — G43109 Migraine with aura, not intractable, without status migrainosus: Secondary | ICD-10-CM

## 2021-08-09 MED ORDER — METOCLOPRAMIDE HCL 5 MG/ML IJ SOLN
10.0000 mg | Freq: Once | INTRAMUSCULAR | Status: AC
  Administered 2021-08-09: 10 mg via INTRAVENOUS
  Filled 2021-08-09: qty 2

## 2021-08-09 MED ORDER — METOCLOPRAMIDE HCL 10 MG PO TABS: 10.0000 mg | ORAL_TABLET | Freq: Three times a day (TID) | ORAL | 0 refills | Status: DC | PRN

## 2021-08-09 MED ORDER — CYCLOBENZAPRINE HCL 5 MG PO TABS
5.0000 mg | ORAL_TABLET | Freq: Once | ORAL | Status: AC
  Administered 2021-08-09: 5 mg via ORAL
  Filled 2021-08-09: qty 1

## 2021-08-09 MED ORDER — PROCHLORPERAZINE EDISYLATE 10 MG/2ML IJ SOLN
10.0000 mg | Freq: Once | INTRAMUSCULAR | Status: AC
  Administered 2021-08-09: 10 mg via INTRAVENOUS
  Filled 2021-08-09: qty 2

## 2021-08-09 MED ORDER — LACTATED RINGERS IV SOLN: Freq: Once | INTRAVENOUS | Status: AC

## 2021-08-09 MED ORDER — PROCHLORPERAZINE MALEATE 10 MG PO TABS: 10.0000 mg | ORAL_TABLET | Freq: Three times a day (TID) | ORAL | 0 refills | Status: DC | PRN

## 2021-08-09 NOTE — ED Triage Notes (Signed)
Patient stated she was laying down when she heard a "pop" on the left side. After the "pop" patient c/o dizziness, blurred vision and numbness on left thigh and bicep. Patient has hx of migraines, is [redacted] wks pregnant.

## 2021-08-09 NOTE — MAU Provider Note (Signed)
Chief Complaint:  Left Sided Pain (Pain after a "pop" was heard by pt in her back )   Event Date/Time   First Provider Initiated Contact with Patient 08/09/21 0216     HPI: Debbie [name] is a 40 y.o. G3P1011 at 60w3dho presents to maternity admissions reporting popping in her neck/back with resultant numbness/tingling in left arm and leg which has now resolved.  Had dizziness which is now improved.  Now has a headache, similar to migraines in past. Is followed by Neuro for seizures and migraines.  Also followed by Dr STamala Julianfor C-spine and musculoskeletal pain in neck/back. . . She denies LOF, vaginal bleeding, vaginal itching/burning, urinary symptoms, dizziness, n/v, diarrhea, constipation or fever/chills.    Had a similar episode in 2021 treated successfully with Reglan, Compazine and Toradol  Headache  This is a recurrent problem. The current episode started today. The problem has been unchanged. The pain quality is similar to prior headaches. The quality of the pain is described as aching and dull. Associated symptoms include neck pain, photophobia and tingling (resolved). Pertinent negatives include no abdominal pain, back pain, blurred vision, dizziness, fever, muscle aches, nausea, sinus pressure or visual change. The symptoms are aggravated by bright light. She has tried nothing for the symptoms. Her past medical history is significant for migraine headaches.     RN Note: PT SAYS SHE WENT TO [place] - FOR A [place]ARP PAIN ON HER BACK- BECAME NAUSEA AND H/A. HAD NUMBNESS ON LEFT SIDE -DIZZY , AND RINGING IN EARS NOW IN TRIAGE- LESS DIZZY AND LESS RINGING , LESS H/A- HAS HX OF MIGRAINES - H/A MEDS CAN'T TAKE DURING PREG  NUMBNESS IN CALF OF LEG ONLY  PNC WITH DR COLE- 9-7 HAS AN APPOINTMENT ON 9-26 WITH NEURO  MIGRAINE NEURO- GETS BOTOX INJ - APPOINTMENT IS 10-3  Past Medical History: Past Medical History:  Diagnosis Date   Anxiety    self reported   Depression    self reported    Maxillary sinus cyst    left   Migraine    Multiple thyroid nodules    3; 1.9 cm and 2 cm, 0.6 cm, will see general  surgeon   No pertinent past medical history     Past obstetric history: OB History  Gravida Para Term Preterm AB Living  '3 1 1   1 1  '$ SAB IAB Ectopic Multiple Live Births  0   1   1    # Outcome Date GA Lbr Len/2nd Weight Sex Delivery Anes PTL Lv  3 Current           2 Term     M CS-Unspec  N LIV     Birth Comments: System Generated. Please review and update pregnancy details.  1 Ectopic             Past Surgical History: Past Surgical History:  Procedure Laterality Date   CESAREAN SECTION  2013   laparscopy     SHOULDER CAPSULORRHAPHY      Family History: Family History  Problem Relation Age of Onset   Heart disease Maternal Grandfather    Cerebral aneurysm Sister    Migraines Sister    Seizures Sister    Heart disease Other        mother's side   High Cholesterol Other        mother's side     Social History: Social History   Tobacco Use   Smoking status: Never  Smokeless tobacco: Never  Vaping Use   Vaping Use: Never used  Substance Use Topics   Alcohol use: No   Drug use: No    Allergies:  Allergies  Allergen Reactions   Tylenol [Acetaminophen] Anaphylaxis   Aspirin Other (See Comments)    Unknown childhood rxn   Benadryl [Diphenhydramine Hcl] Itching   Buspirone     Other reaction(s): migraines   Citalopram Hydrobromide     Other reaction(s): agitation and dizziness   Dilaudid [Hydromorphone Hcl] Itching    Pill only   Effexor Xr [Venlafaxine]     Other reaction(s): More depressed (11/2017-neurology)   Penicillins     hives   Prednisone     Swelling, hives   Sulfamethoxazole-Trimethoprim     Other reaction(s): swelling (2017)   Zoloft [Sertraline]     Other reaction(s): nausea and swelling   Latex Rash    Meds:  Medications Prior to Admission  Medication Sig Dispense Refill Last Dose   benzonatate (TESSALON) 200  MG capsule Take 1 capsule (200 mg total) by mouth every 6 (six) hours as needed for cough. (Patient not taking: Reported on 08/07/2021) 30 capsule 1    Botulinum Toxin Type A (BOTOX) 200 units SOLR Inject 155 Units as directed every 3 (three) months. Inject 155units to head and neck intramuscularly every 3 months. 1 each 3    gabapentin (NEURONTIN) 100 MG capsule Take 2 capsules (200 mg total) by mouth at bedtime. (Patient taking differently: Take 200 mg by mouth at bedtime as needed (joint pain).) 180 capsule 0    loratadine (CLARITIN) 10 MG tablet Take 10 mg by mouth daily as needed for allergies.      ondansetron (ZOFRAN ODT) 4 MG disintegrating tablet Take 1 tablet (4 mg total) by mouth every 8 (eight) hours as needed for nausea. 10 tablet 0    Prenatal Vit-Fe Fumarate-FA (PRENATAL MULTIVITAMIN) TABS tablet Take 1 tablet by mouth daily at 12 noon.       I have reviewed patient's Past Medical Hx, Surgical Hx, Family Hx, Social Hx, medications and allergies.   ROS:  Review of Systems  Constitutional:  Negative for fever.  HENT:  Negative for sinus pressure.   Eyes:  Positive for photophobia. Negative for blurred vision.  Gastrointestinal:  Negative for abdominal pain and nausea.  Musculoskeletal:  Positive for neck pain. Negative for back pain.  Neurological:  Positive for tingling (resolved) and headaches. Negative for dizziness.  Other systems negative  Physical Exam   Vitals:   08/09/21 0129 08/09/21 0238  BP: (!) 145/93 115/69  Pulse: 93 86  Resp: 15 20  Temp: 98.7 F (37.1 C) 98.3 F (36.8 C)  SpO2: 99%     Constitutional: Well-developed, well-nourished female in no acute distress.  Cardiovascular: normal rate and rhythm Respiratory: normal effort, clear to auscultation bilaterally GI: Abd soft, non-tender, gravid appropriate for gestational age.   No rebound or guarding. MS: Extremities nontender, no edema, normal ROM Neurologic: Alert and oriented x 4. MAEW x 4.  Normal  speech. No facial droop.  GU: Neg CVAT.  FHT:  150   Labs: No results found for this or any previous visit (from the past 24 hour(s)).    Imaging:    MAU Course/MDM: Chart reviewed.  Previous episode with identical symptoms noted .11/26/19.  It was thought to be a complex migraine per neurology. They treated it with Reglan, compazine and Toradol  We did the same combination except Toradol  We gave  Flexeril in addition as I suspect she may have some muscle tension as well, given her neck and back issues  (see Dr Thompson Caul note 08/08/21) Treatments in MAU included IV hydration, Reglan, Compazine (allergic to Benadryl) and Flexeril She got good relief of her symptoms with this regimen..    Assessment: 1. Intractable headache, unspecified chronicity pattern, unspecified headache type   2.    Probable complex migraine 3.    Intermittent hypertension, suspect chronic 4.    Neck and back pain 5.    Single IUP at [redacted]w[redacted]d  Plan: Discharge home Will rx Reglan and compazine for prn use at home for migraines.  Follow up in Office for prenatal visits  Followup with Neurology as scheduled Encouraged to return if she develops worsening of symptoms, increase in pain, fever, or other concerning symptoms.   Pt stable at time of discharge.  MHansel FeinsteinCNM, MSN Certified Nurse-Midwife 08/09/2021 2:16 AM

## 2021-08-09 NOTE — ED Notes (Signed)
Patient taken to MAU, report given to charge RN at [facility]

## 2021-08-09 NOTE — MAU Note (Signed)
PT SAYS SHE WENT TO [place] - FOR A SHARP PAIN ON HER BACK- BECAME NAUSEA AND H/A. HAD NUMBNESS ON LEFT SIDE -DIZZY , AND RINGING IN EARS NOW IN TRIAGE- LESS DIZZY AND LESS RINGING , LESS H/A- HAS HX OF MIGRAINES - H/A MEDS CAN'T TAKE DURING PREG  NUMBNESS IN CALF OF LEG ONLY  PNC WITH DR COLE- 9-7 HAS AN APPOINTMENT ON 9-26 WITH NEURO  MIGRAINE NEURO- GETS BOTOX INJ - APPOINTMENT IS 10-3

## 2021-08-09 NOTE — ED Provider Notes (Signed)
Emergency Medicine Provider OB Triage Evaluation Note  Debbie [name] is a 40 y.o. female, G3P1011, at 76w3dgestation who presents to the emergency department with complaints of headache.  States that headache began PTA.  Has history of migraines.  Mildly elevated BP.  Review of  Systems  Positive: headache, tingling Negative: fever, chills  Physical Exam  BP (!) 145/93 (BP Location: Left Arm)   Pulse 93   Temp 98.7 F (37.1 C) (Oral)   Resp 15   Ht '5\' 3"'$  (1.6 m)   Wt 107 kg   LMP 03/26/2021 (Approximate)   SpO2 99%   BMI 41.81 kg/m  General: Awake, no distress  HEENT: Atraumatic  Resp: Normal effort  Cardiac: Normal rate Abd: Nondistended, nontender  MSK: Moves all extremities without difficulty Neuro: Speech clear  Medical Decision Making  Pt evaluated for pregnancy concern and is stable for transfer to MAU. Pt is in agreement with plan for transfer.  1:51 AM Discussed with MAU APP, MLelan Pons who accepts patient in transfer.  Clinical Impression   1. Intractable headache, unspecified chronicity pattern, unspecified headache type        BMontine Circle PA-C 08/09/21 0153    WArnaldo Natal MD 08/09/21 [phone]

## 2021-08-16 ENCOUNTER — Other Ambulatory Visit: Payer: Self-pay

## 2021-08-16 ENCOUNTER — Ambulatory Visit (HOSPITAL_COMMUNITY): Payer: BC Managed Care – PPO | Admitting: Licensed Clinical Social Worker

## 2021-08-16 ENCOUNTER — Encounter (HOSPITAL_COMMUNITY): Payer: Self-pay

## 2021-08-21 ENCOUNTER — Telehealth: Payer: Self-pay | Admitting: *Deleted

## 2021-08-21 ENCOUNTER — Other Ambulatory Visit: Payer: Self-pay

## 2021-08-21 ENCOUNTER — Ambulatory Visit: Payer: BC Managed Care – PPO | Admitting: Neurology

## 2021-08-21 ENCOUNTER — Encounter: Payer: Self-pay | Admitting: Neurology

## 2021-08-21 VITALS — BP 114/79 | HR 94 | Ht 63.0 in | Wt 236.4 lb

## 2021-08-21 DIAGNOSIS — R569 Unspecified convulsions: Secondary | ICD-10-CM | POA: Diagnosis not present

## 2021-08-21 DIAGNOSIS — G43709 Chronic migraine without aura, not intractable, without status migrainosus: Secondary | ICD-10-CM | POA: Diagnosis not present

## 2021-08-21 DIAGNOSIS — R404 Transient alteration of awareness: Secondary | ICD-10-CM | POA: Diagnosis not present

## 2021-08-21 NOTE — Telephone Encounter (Signed)
Per Dr Jaynee Eagles, order 72 hr AMB EEG via [facility]. Referral form completed, signed by MD, and faxed to [facility] along with office note, routine EEG result and insurance information. Received a receipt of confirmation.

## 2021-08-21 NOTE — Telephone Encounter (Signed)
Order completed, to be signed then will fax to [facility].

## 2021-08-21 NOTE — Patient Instructions (Addendum)
Ambulatory 72 hour eeg, you will be called to have them come to the house to set it up  [facility] will be calling - let us know if you din;t hear from them within a week
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
## 2021-08-21 NOTE — Telephone Encounter (Signed)
-----   Message from Melvenia Beam, MD sent at 08/21/2021  9:20 AM EDT ----- Can you please fill out a 72 hour ambulatory eeg for patient, thanks  [facility]
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
## 2021-08-26 ENCOUNTER — Ambulatory Visit (INDEPENDENT_AMBULATORY_CARE_PROVIDER_SITE_OTHER): Payer: BC Managed Care – PPO | Admitting: Family Medicine

## 2021-08-26 ENCOUNTER — Encounter: Payer: Self-pay | Admitting: Family Medicine

## 2021-08-26 DIAGNOSIS — G43709 Chronic migraine without aura, not intractable, without status migrainosus: Secondary | ICD-10-CM

## 2021-08-26 NOTE — Telephone Encounter (Signed)
[facility] needs an update on the EEG order.

## 2021-08-26 NOTE — Telephone Encounter (Signed)
From Saralyn Pilar at [facility]: At your earliest convenience, can you kindly please update Debbie [name]'s (DOB: 1981/05/24) Diagnosis Code, per the insurance companies request.   Suggested Dx Codes: G40.001-G40.919, G40.A01-G40.A19, R25.0-R25.9, R55 or R56.0-R56.9

## 2021-08-26 NOTE — Telephone Encounter (Signed)
New addended diagnosis faxed to [facility] R56.9) received fax confirmation.

## 2021-08-26 NOTE — Progress Notes (Signed)
Botox- 200 units x 1 vial Lot: [id number] Expiration: 03/25 NDC: 2197-5883-25  0.9% Sodium Chloride- 65mL total Lot: [id number] Expiration: 11/24/22 NDC: 1583-0940-76  Dx: K08.811 S/P

## 2021-09-09 ENCOUNTER — Ambulatory Visit: Payer: BC Managed Care – PPO | Admitting: *Deleted

## 2021-09-09 ENCOUNTER — Other Ambulatory Visit: Payer: Self-pay

## 2021-09-09 ENCOUNTER — Ambulatory Visit: Payer: BC Managed Care – PPO | Attending: Maternal & Fetal Medicine

## 2021-09-09 ENCOUNTER — Other Ambulatory Visit: Payer: Self-pay | Admitting: Obstetrics and Gynecology

## 2021-09-09 VITALS — BP 126/79 | HR 93

## 2021-09-09 DIAGNOSIS — O359XX0 Maternal care for (suspected) fetal abnormality and damage, unspecified, not applicable or unspecified: Secondary | ICD-10-CM

## 2021-09-09 DIAGNOSIS — O09522 Supervision of elderly multigravida, second trimester: Secondary | ICD-10-CM

## 2021-09-09 DIAGNOSIS — Z6841 Body Mass Index (BMI) 40.0 and over, adult: Secondary | ICD-10-CM | POA: Diagnosis present

## 2021-09-09 DIAGNOSIS — Z3A23 23 weeks gestation of pregnancy: Secondary | ICD-10-CM

## 2021-09-09 DIAGNOSIS — E669 Obesity, unspecified: Secondary | ICD-10-CM | POA: Diagnosis not present

## 2021-09-09 DIAGNOSIS — O99212 Obesity complicating pregnancy, second trimester: Secondary | ICD-10-CM | POA: Diagnosis not present

## 2021-09-09 DIAGNOSIS — O34219 Maternal care for unspecified type scar from previous cesarean delivery: Secondary | ICD-10-CM | POA: Diagnosis not present

## 2021-09-09 DIAGNOSIS — G40909 Epilepsy, unspecified, not intractable, without status epilepticus: Secondary | ICD-10-CM

## 2021-09-09 DIAGNOSIS — Z362 Encounter for other antenatal screening follow-up: Secondary | ICD-10-CM | POA: Diagnosis present

## 2021-09-09 DIAGNOSIS — Z98891 History of uterine scar from previous surgery: Secondary | ICD-10-CM

## 2021-09-09 DIAGNOSIS — O99350 Diseases of the nervous system complicating pregnancy, unspecified trimester: Secondary | ICD-10-CM

## 2021-09-09 IMAGING — US US MFM OB FOLLOW-UP
1 series · 13 of 28 positions shown · non-contrast
Comparison: none

[Series 1: us mfm ob follow-up · 13 of 95 slices shown]
[im 4/95]
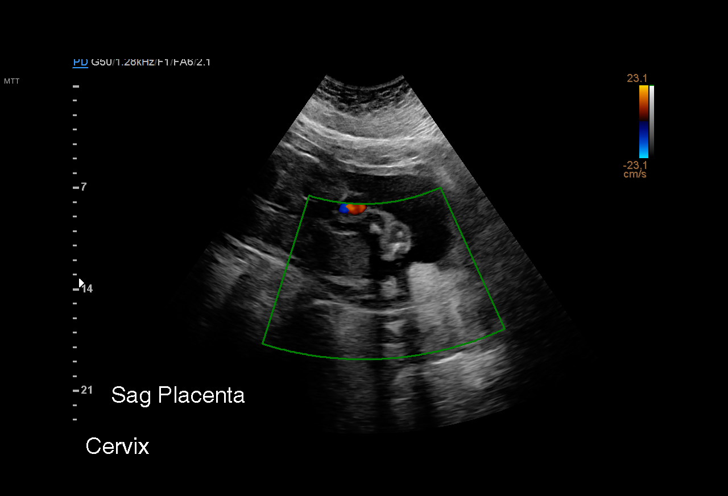
[im 11/95]
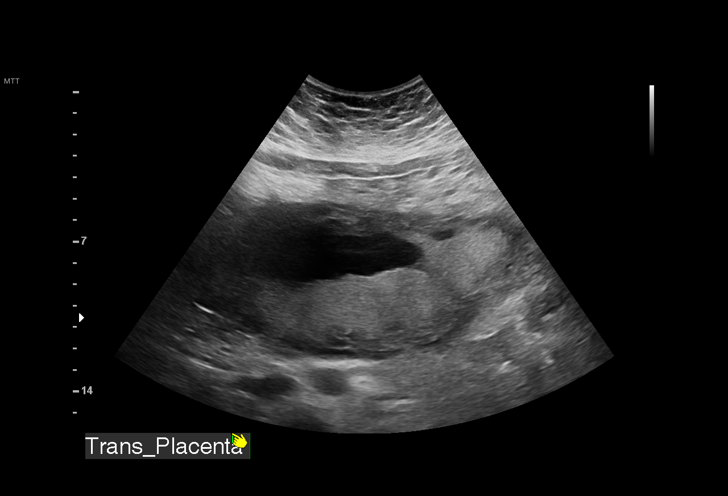
[im 18/95]
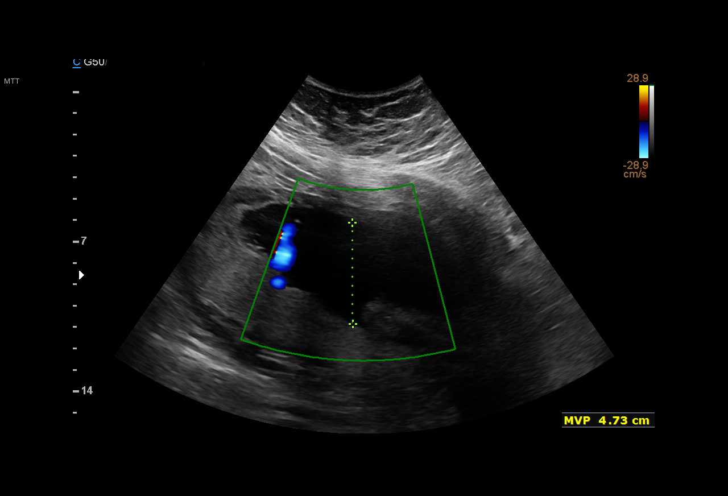
[im 25/95]
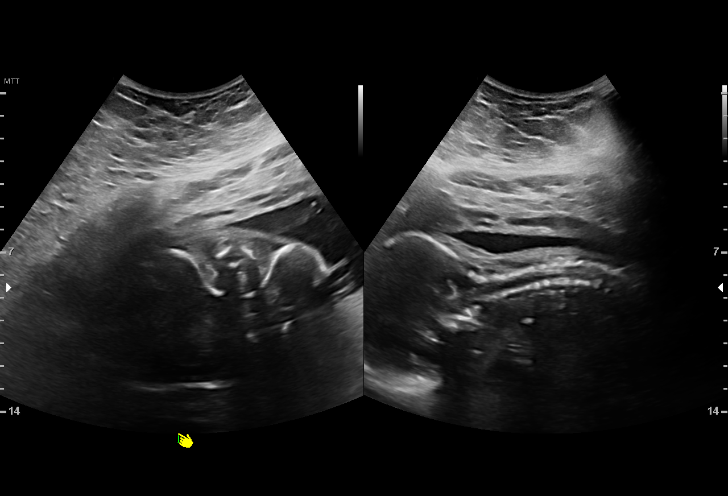
[im 32/95]
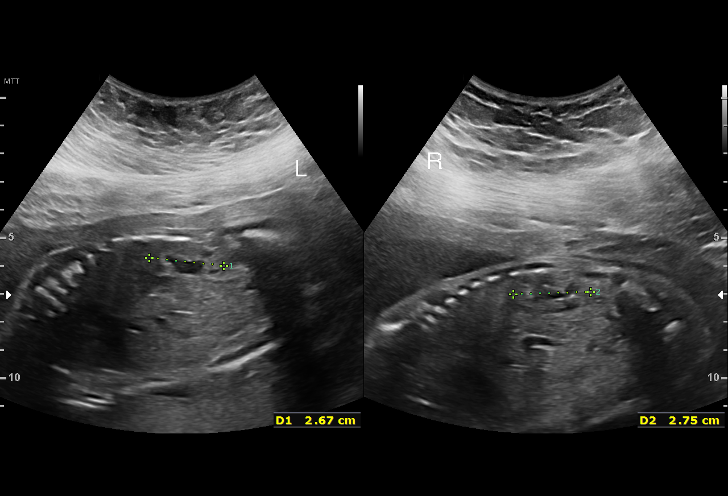
[im 39/95]
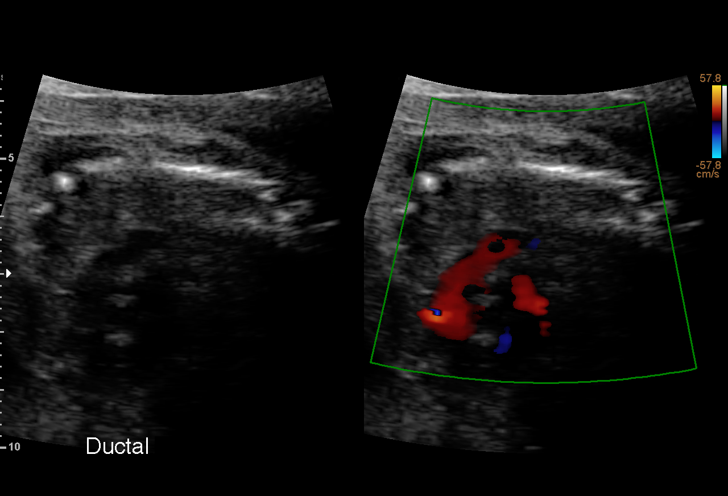
[im 49/95]
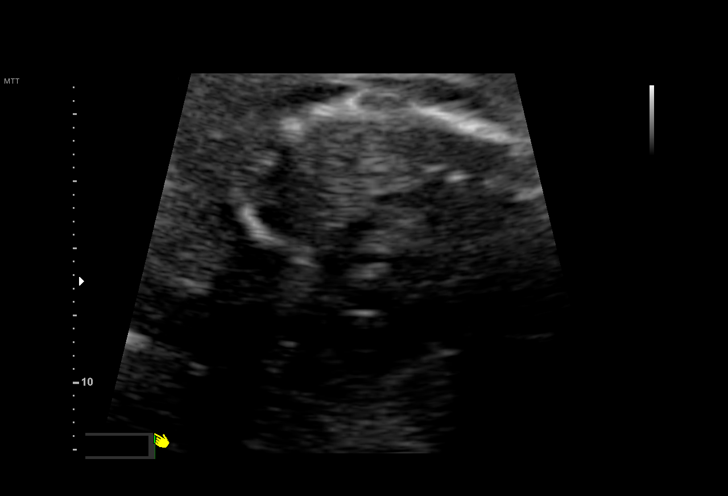
[im 56/95]
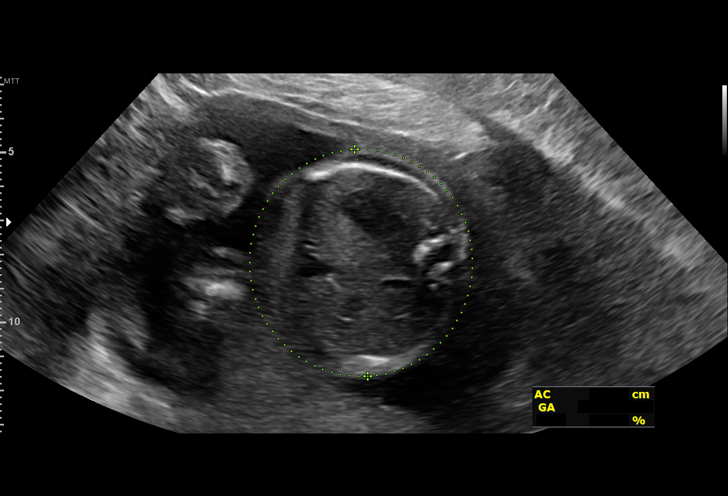
[im 63/95]
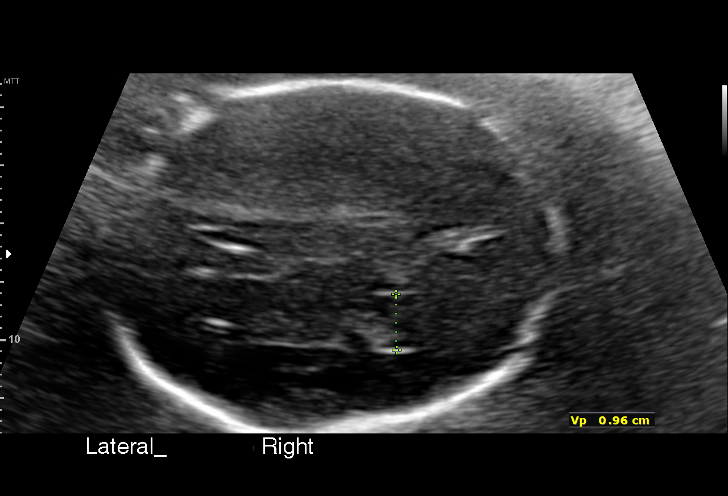
[im 70/95]
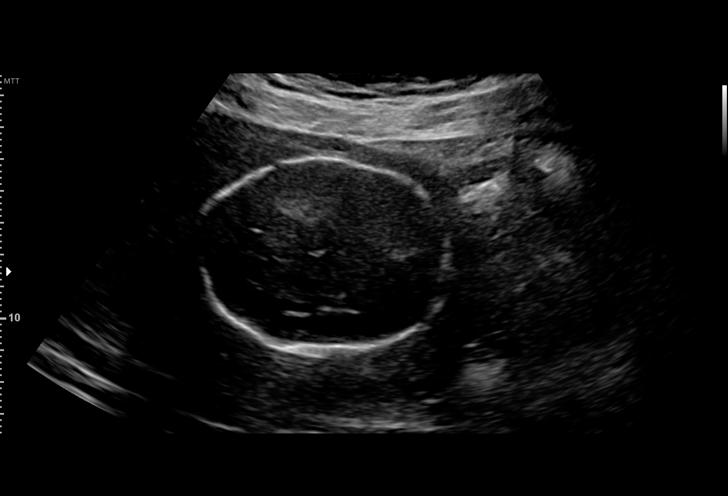
[im 77/95]
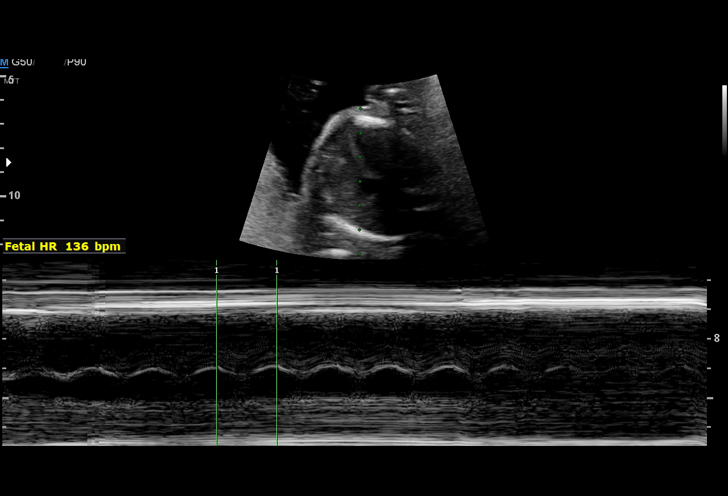
[im 84/95]
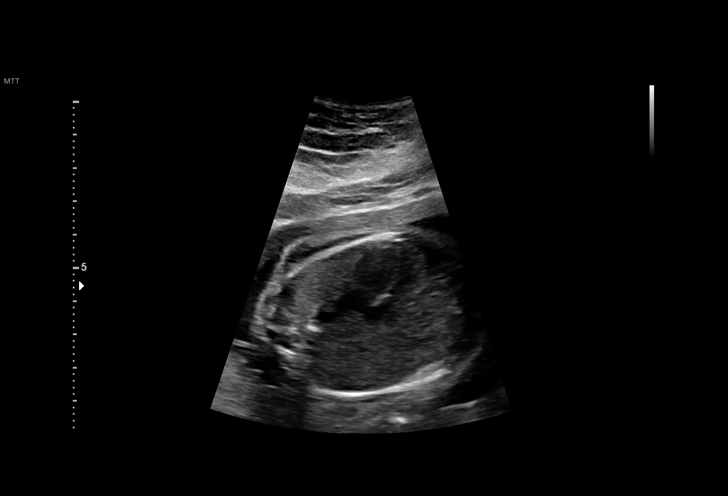
[im 91/95]
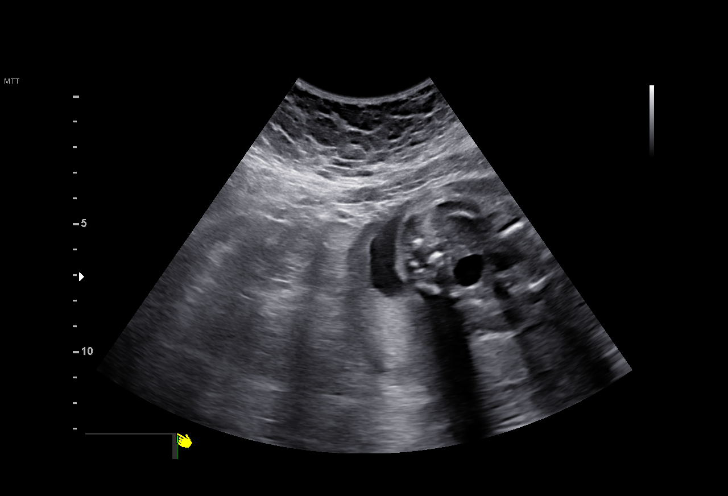

[13 of 28 positions shown; findings below may reference images not displayed]

[REDACTED]
                                                            Ave[address] [HOSPITAL]

                                                      [facility]

Indications

 Obesity complicating pregnancy, second         [SG]
 trimester (BMI 40)
 23 weeks gestation of pregnancy
 Antenatal follow-up for nonvisualized fetal    [SG]
 anatomy
 Pyelectasis of fetus on prenatal ultrasound    [SG]
 Advanced maternal age multigravida 35+,        [SG]
 second trimester (40 yo)
 Elevated blood pressure affecting pregnancy    [SG]
 in second trimester (131/95 in ED)
 Previous cesarean delivery, antepartum         [SG]
 (Breech)
 Medical complication of pregnancy (Covid +     [SG]
 in pregnancy)
 Seizure disorder (Cervical bone spur           [SG] [SG]
 affecting vagus nerve)
 Encounter for other antenatal screening        [SG]
 follow-up
Fetal Evaluation

 Num Of Fetuses:         1
 Fetal Heart Rate(bpm):  136
 Cardiac Activity:       Observed
 Presentation:           Breech
 Placenta:               Posterior
 P. Cord Insertion:      Visualized, central
 Amniotic Fluid
 AFI FV:      Within normal limits

                             Largest Pocket(cm)

Biometry

 BPD:      59.1  mm     G. Age:  24w 1d         55  %    CI:        70.71   %    70 - 86
                                                         FL/HC:      20.6   %    18.7 -
 HC:       224   mm     G. Age:  24w 3d         55  %    HC/AC:      1.07        1.05 -
 AC:       210   mm     G. Age:  25w 4d         88  %    FL/BPD:     78.0   %    71 - 87
 FL:       46.1  mm     G. Age:  25w 2d         81  %    FL/AC:      22.0   %    20 - 24
 LV:          9  mm

 Est. FW:     790  gm    1 lb 12 oz      95  %
OB History

 Gravidity:    3         Term:   1        Prem:   0        SAB:   0
 TOP:          0       Ectopic:  1        Living: 1
Gestational Age

 LMP:           23w 6d        Date:  [DATE]                 EDD:   [DATE]
 U/S Today:     24w 6d                                        EDD:   [DATE]
 Best:          23w 6d     Det. By:  LMP  ([DATE])          EDD:   [DATE]
Anatomy

 Cranium:               Appears normal         LVOT:                   Not well visualized
 Cavum:                 Appears normal         Aortic Arch:            Appears normal
 Ventricles:            Appears normal         Ductal Arch:            Not well visualized
 Choroid Plexus:        Previously seen        Diaphragm:              Appears normal
 Cerebellum:            Previously seen        Stomach:                Appears normal, left
                                                                       sided
 Posterior Fossa:       Previously seen        Abdomen:                Appears normal
 Nuchal Fold:           Previously seen        Abdominal Wall:         Previously seen
 Face:                  Orbits nl previously;  Cord Vessels:           Previously seen
                        profile not well vis
 Lips:                  Previously seen        Kidneys:                Left UTD  4mm
 Palate:                Not well visualized    Bladder:                Appears normal
 Thoracic:              Appears normal         Spine:                  Appears normal
 Heart:                 Appears normal         Upper Extremities:      Visualized
                        (4CH, axis, and                                previously
                        situs)
 RVOT:                  Not well visualized    Lower Extremities:      Visualized
                                                                       previously

 Other:  Male gender previously seen. Technically difficult due to maternal
         habitus and fetal position.
Cervix Uterus Adnexa

 Cervix
 Length:           4.19  cm.
 Normal appearance by transabdominal scan.
 Uterus
 No abnormality visualized.

 Right Ovary
 Within normal limits.

 Left Ovary
 Within normal limits.

 Cul De Sac
 No free fluid seen.

 Adnexa
 No abnormality visualized.
Impression

 Patient returned for completion of fetal anatomy and growth.
 Left urinary tract dilation was seen at anatomy scan.

 Fetal growth is appropriate for gestational age.  Amniotic fluid
 is normal good fetal activity seen.  Fetal anatomical survey
 could still not be completed because of fetal position and
 maternal body habitus.  Mild left urinary tract dilation
 measuring 4 mm is seen.

 As maternal obesity limits resolution of images, failure to
 detect anomalies are more common .
Recommendations

 -An appointment was made for her to return in 4 weeks for
 fetal growth assessment.
                 YANIV

## 2021-09-10 ENCOUNTER — Ambulatory Visit (INDEPENDENT_AMBULATORY_CARE_PROVIDER_SITE_OTHER): Payer: BC Managed Care – PPO

## 2021-09-10 ENCOUNTER — Encounter: Payer: Self-pay | Admitting: Cardiology

## 2021-09-10 ENCOUNTER — Ambulatory Visit (INDEPENDENT_AMBULATORY_CARE_PROVIDER_SITE_OTHER): Payer: BC Managed Care – PPO | Admitting: Cardiology

## 2021-09-10 VITALS — BP 128/78 | HR 88

## 2021-09-10 DIAGNOSIS — R002 Palpitations: Secondary | ICD-10-CM | POA: Diagnosis not present

## 2021-09-10 DIAGNOSIS — R600 Localized edema: Secondary | ICD-10-CM | POA: Diagnosis not present

## 2021-09-10 DIAGNOSIS — R55 Syncope and collapse: Secondary | ICD-10-CM

## 2021-09-10 DIAGNOSIS — E782 Mixed hyperlipidemia: Secondary | ICD-10-CM

## 2021-09-10 NOTE — Patient Instructions (Addendum)
Medication Instructions:  Your physician recommends that you continue on your current medications as directed. Please refer to the Current Medication list given to you today.  *If you need a refill on your cardiac medications before your next appointment, please call your pharmacy*   Lab Work: None If you have labs (blood work) drawn today and your tests are completely normal, you will receive your results only by: [place] (if you have MyChart) OR A paper copy in the mail If you have any lab test that is abnormal or we need to change your treatment, we will call you to review the results.   Testing/Procedures: Your physician has requested that you have an echocardiogram. Echocardiography is a painless test that uses sound waves to create images of your heart. It provides your doctor with information about the size and shape of your heart and how well your heart's chambers and valves are working. This procedure takes approximately one hour. There are no restrictions for this procedure.  ZIO XT- Long Term Monitor Instructions  Your physician has requested you wear a ZIO patch monitor for 14 days.  This is a single patch monitor. [employer] supplies one patch monitor per enrollment. Additional stickers are not available. Please do not apply patch if you will be having a Nuclear Stress Test,  Echocardiogram, Cardiac CT, MRI, or Chest Xray during the period you would be wearing the  monitor. The patch cannot be worn during these tests. You cannot remove and re-apply the  ZIO XT patch monitor.  Your ZIO patch monitor will be mailed 3 day USPS to your address on file. It may take 3-5 days  to receive your monitor after you have been enrolled.  Once you have received your monitor, please review the enclosed instructions. Your monitor  has already been registered assigning a specific monitor serial # to you.  Billing and Patient Assistance Program Information  We have supplied [employer] with  any of your insurance information on file for billing purposes. [employer] offers a sliding scale Patient Assistance Program for patients that do not have  insurance, or whose insurance does not completely cover the cost of the ZIO monitor.  You must apply for the Patient Assistance Program to qualify for this discounted rate.  To apply, please call [employer] at [phone], select option 4, select option 2, ask to apply for  Patient Assistance Program. Theodore Demark will ask your household income, and how many people  are in your household. They will quote your out-of-pocket cost based on that information.  [employer] will also be able to set up a 89-month interest-free payment plan if needed.  Applying the monitor   Shave hair from upper left chest.  Hold abrader disc by orange tab. Rub abrader in 40 strokes over the upper left chest as  indicated in your monitor instructions.  Clean area with 4 enclosed alcohol pads. Let dry.  Apply patch as indicated in monitor instructions. Patch will be placed under collarbone on left  side of chest with arrow pointing upward.  Rub patch adhesive wings for 2 minutes. Remove white label marked "1". Remove the white  label marked "2". Rub patch adhesive wings for 2 additional minutes.  While looking in a mirror, press and release button in center of patch. A small green light will  flash 3-4 times. This will be your only indicator that the monitor has been turned on.  Do not shower for the first 24 hours. You may shower after the first  24 hours.  Press the button if you feel a symptom. You will hear a small click. Record Date, Time and  Symptom in the Patient Logbook.  When you are ready to remove the patch, follow instructions on the last 2 pages of Patient  Logbook. Stick patch monitor onto the last page of Patient Logbook.  Place Patient Logbook in the blue and white box. Use locking tab on box and tape box closed  securely. The blue and white box has prepaid  postage on it. Please place it in the mailbox as  soon as possible. Your physician should have your test results approximately 7 days after the  monitor has been mailed back to [facility].  Call [place] at [phone] if you have questions regarding  your ZIO XT patch monitor. Call them immediately if you see an orange light blinking on your  monitor.  If your monitor falls off in less than 4 days, contact our Monitor department at [phone].  If your monitor becomes loose or falls off after 4 days call [employer] at [phone] for  suggestions on securing your monitor   Follow-Up: At [facility], you and your health needs are our priority.  As part of our continuing mission to provide you with exceptional heart care, we have created designated Provider Care Teams.  These Care Teams include your primary Cardiologist (physician) and Advanced Practice Providers (APPs -  Physician Assistants and Nurse Practitioners) who all work together to provide you with the care you need, when you need it.  We recommend signing up for the patient portal called "MyChart".  Sign up information is provided on this After Visit Summary.  MyChart is used to connect with patients for Virtual Visits (Telemedicine).  Patients are able to view lab/test results, encounter notes, upcoming appointments, etc.  Non-urgent messages can be sent to your provider as well.   To learn more about what you can do with MyChart, go to [email or web address].    Your next appointment:   8-12 week(s)  The format for your next appointment:   In Person  Provider:   Berniece Salines, DO [address], [place], [place] [address]    Other Instructions Echocardiogram An echocardiogram is a test that uses sound waves (ultrasound) to produce images of the heart. Images from an echocardiogram can provide important information about: Heart size and shape. The size and thickness and movement of your  heart's walls. Heart muscle function and strength. Heart valve function or if you have stenosis. Stenosis is when the heart valves are too narrow. If blood is flowing backward through the heart valves (regurgitation). A tumor or infectious growth around the heart valves. Areas of heart muscle that are not working well because of poor blood flow or injury from a heart attack. Aneurysm detection. An aneurysm is a weak or damaged part of an artery wall. The wall bulges out from the normal force of blood pumping through the body. Tell a health care provider about: Any allergies you have. All medicines you are taking, including vitamins, herbs, eye drops, creams, and over-the-counter medicines. Any blood disorders you have. Any surgeries you have had. Any medical conditions you have. Whether you are pregnant or may be pregnant. What are the risks? Generally, this is a safe test. However, problems may occur, including an allergic reaction to dye (contrast) that may be used during the test. What happens before the test? No specific preparation is needed. You may eat and drink normally. What  happens during the test?  You will take off your clothes from the waist up and put on a hospital gown. Electrodes or electrocardiogram (ECG)patches may be placed on your chest. The electrodes or patches are then connected to a device that monitors your heart rate and rhythm. You will lie down on a table for an ultrasound exam. A gel will be applied to your chest to help sound waves pass through your skin. A handheld device, called a transducer, will be pressed against your chest and moved over your heart. The transducer produces sound waves that travel to your heart and bounce back (or "echo" back) to the transducer. These sound waves will be captured in real-time and changed into images of your heart that can be viewed on a video monitor. The images will be recorded on a computer and reviewed by your health care  provider. You may be asked to change positions or hold your breath for a short time. This makes it easier to get different views or better views of your heart. In some cases, you may receive contrast through an IV in one of your veins. This can improve the quality of the pictures from your heart. The procedure may vary among health care providers and hospitals. What can I expect after the test? You may return to your normal, everyday life, including diet, activities, and medicines, unless your health care provider tells you not to do that. Follow these instructions at home: It is up to you to get the results of your test. Ask your health care provider, or the department that is doing the test, when your results will be ready. Keep all follow-up visits. This is important. Summary An echocardiogram is a test that uses sound waves (ultrasound) to produce images of the heart. Images from an echocardiogram can provide important information about the size and shape of your heart, heart muscle function, heart valve function, and other possible heart problems. You do not need to do anything to prepare before this test. You may eat and drink normally. After the echocardiogram is completed, you may return to your normal, everyday life, unless your health care provider tells you not to do that. This information is not intended to replace advice given to you by your health care provider. Make sure you discuss any questions you have with your health care provider. Document Revised: 07/03/2020 Document Reviewed: 07/03/2020 Elsevier Patient Education  2022 [employer].
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
## 2021-09-10 NOTE — Progress Notes (Unsigned)
Patient enrolled for [employer] to mail a 14 day ZIO XT monitor to her address on file.

## 2021-09-11 ENCOUNTER — Ambulatory Visit: Payer: BC Managed Care – PPO | Admitting: Family Medicine

## 2021-09-13 DIAGNOSIS — R569 Unspecified convulsions: Secondary | ICD-10-CM | POA: Diagnosis not present

## 2021-09-14 DIAGNOSIS — R569 Unspecified convulsions: Secondary | ICD-10-CM | POA: Diagnosis not present

## 2021-09-15 DIAGNOSIS — R569 Unspecified convulsions: Secondary | ICD-10-CM | POA: Diagnosis not present

## 2021-09-16 DIAGNOSIS — R002 Palpitations: Secondary | ICD-10-CM

## 2021-09-18 ENCOUNTER — Other Ambulatory Visit: Payer: Self-pay | Admitting: Neurology

## 2021-09-18 DIAGNOSIS — R569 Unspecified convulsions: Secondary | ICD-10-CM

## 2021-09-18 NOTE — Procedures (Signed)
   Description 72 Hours Ambulatory Video EEG  TECHNICAL DESCRIPTION: This AVEEG was performed using equipment provided by [employer] utilizing Bluetooth (Trackit ) amplifiers with continuous EEGT attended video collection using encrypted remote transmission via [place] secured cellular tower network with data rates for each AVEEG performed. This is a Biomedical engineer AVEEG, obtained, according to the 10-20 international electrode placement system, reformatted digitally into referential and bipolar montages. Data was acquired with a minimum of 21 bipolar connections and sampled at a minimum rate of 250 cycles per second per channel, maximum rate of 450 cycles per second per channel and two channels for EKG. The entire AVEEG study was recorded through cable and or radio telemetry for subsequent analysis. Specified epochs of the AVEEG data were identified at the direction of the subject by the depression of a push button by the patient. Each patient's event file included data acquired two minutes prior to the push button activation and continuing until two minutes afterwards. AVEEG files were reviewed on [employer] Platform by [employer] with a digital high frequency filter set at 70 Hz and a low frequency filter set at 0.1 Hz with a paper speed of 21mm/s resulting in 10 seconds per digital page. This entire AVEEG was reviewed by the EEG Technologist. Random time samples, random sleep samples, clips, patient initiated push button files with included patient daily diary logs, EEG Technologist bookmarked note data was reviewed and verified for accuracy and validity by the governing reading neurologist in full details. This AEEGV was fully compliant with all requirements for CPT 97500 for setup, patient education, take down and administered by an EEG technologist.   INTERMITTENT MONITORING WITH VIDEO TECHNICAL DESCRIPTION:  This Long-Term AVEEG was monitored intermittently by a qualified EEG technologist for the  entirety of the recording; quality check-ins were performed at a minimum of every two hours, checking and documenting real-time data and video to assure the integrity and quality of the recording (e.g., camera position, electrode integrity and impedance), and identify the need for maintenance. For intermittent monitoring, an EEG Technologist monitored no more than 12 patients concurrently. Diagnostic video was captured at least 80% of the time during the recording.   TECHNOLOGIST EVENTS: Notes for sharp morphology vs wicket spikes. No clear epileptiform activity was detected by the reviewing neurodiagnostic technologist during the recording for further evaluation.  PATIENT EVENTS:  The patient pushed the event button 0 times during the AVEEG recording for further evaluation. However, EEGT test press #1 time with pt at the beginning of the tracing.  TIME SAMPLES: 1 minute of every hour recorded are reviewed as random time samples.   SLEEP SAMPLES: 5 minutes of every 24 hour recorded sleep cycle are reviewed as random sleep samples.   BACKGROUND EEG: This AVEEG consists of well-modulated bilateral synchronous and symmetrical background in the alpha frequencies in the awake state.  AWAKE: The posterior dominant rhythm was characterized by symmetric and reactive 10-11 Hz activity with eyes closed.   SLEEP: Stage two sleep displayed Sleep Spindles and Vertex Waveform components. All other sleep stages appeared symmetrical and synchronous with regards to K-Complexes, and REM.  EKG: Normal sinus rhythm.   AVEEG Interpretations: This is a normal video ambulatory EEG. No events were captured.     Alric Ran, MD [facility]

## 2021-09-20 ENCOUNTER — Other Ambulatory Visit: Payer: Self-pay

## 2021-09-20 ENCOUNTER — Ambulatory Visit (HOSPITAL_COMMUNITY): Payer: BC Managed Care – PPO | Attending: Cardiology

## 2021-09-20 DIAGNOSIS — R600 Localized edema: Secondary | ICD-10-CM | POA: Insufficient documentation

## 2021-09-20 DIAGNOSIS — R55 Syncope and collapse: Secondary | ICD-10-CM | POA: Insufficient documentation

## 2021-09-20 LAB — ECHOCARDIOGRAM COMPLETE
Area-P 1/2: 3.85 cm2
S' Lateral: 2.9 cm

## 2021-10-07 ENCOUNTER — Ambulatory Visit: Payer: BC Managed Care – PPO

## 2021-10-07 ENCOUNTER — Ambulatory Visit: Payer: BC Managed Care – PPO | Admitting: *Deleted

## 2021-10-07 ENCOUNTER — Other Ambulatory Visit: Payer: Self-pay

## 2021-10-15 ENCOUNTER — Ambulatory Visit: Payer: BC Managed Care – PPO | Admitting: Family Medicine

## 2021-10-23 ENCOUNTER — Ambulatory Visit: Payer: BC Managed Care – PPO | Attending: Obstetrics and Gynecology

## 2021-10-23 ENCOUNTER — Other Ambulatory Visit: Payer: Self-pay | Admitting: *Deleted

## 2021-10-23 ENCOUNTER — Other Ambulatory Visit: Payer: Self-pay

## 2021-10-23 ENCOUNTER — Ambulatory Visit: Payer: BC Managed Care – PPO | Admitting: *Deleted

## 2021-10-23 VITALS — BP 117/74 | HR 94

## 2021-10-23 DIAGNOSIS — O09523 Supervision of elderly multigravida, third trimester: Secondary | ICD-10-CM

## 2021-10-23 DIAGNOSIS — O99212 Obesity complicating pregnancy, second trimester: Secondary | ICD-10-CM | POA: Insufficient documentation

## 2021-10-23 DIAGNOSIS — O99350 Diseases of the nervous system complicating pregnancy, unspecified trimester: Secondary | ICD-10-CM | POA: Insufficient documentation

## 2021-10-23 DIAGNOSIS — O359XX0 Maternal care for (suspected) fetal abnormality and damage, unspecified, not applicable or unspecified: Secondary | ICD-10-CM | POA: Diagnosis present

## 2021-10-23 DIAGNOSIS — Z3A30 30 weeks gestation of pregnancy: Secondary | ICD-10-CM

## 2021-10-23 DIAGNOSIS — O99353 Diseases of the nervous system complicating pregnancy, third trimester: Secondary | ICD-10-CM | POA: Diagnosis not present

## 2021-10-23 DIAGNOSIS — O99213 Obesity complicating pregnancy, third trimester: Secondary | ICD-10-CM | POA: Diagnosis not present

## 2021-10-23 DIAGNOSIS — O09522 Supervision of elderly multigravida, second trimester: Secondary | ICD-10-CM | POA: Insufficient documentation

## 2021-10-23 DIAGNOSIS — Z98891 History of uterine scar from previous surgery: Secondary | ICD-10-CM | POA: Diagnosis present

## 2021-10-23 DIAGNOSIS — O358XX0 Maternal care for other (suspected) fetal abnormality and damage, not applicable or unspecified: Secondary | ICD-10-CM | POA: Diagnosis not present

## 2021-10-23 DIAGNOSIS — G40909 Epilepsy, unspecified, not intractable, without status epilepticus: Secondary | ICD-10-CM | POA: Diagnosis present

## 2021-10-31 ENCOUNTER — Encounter (HOSPITAL_COMMUNITY): Payer: Self-pay | Admitting: Obstetrics and Gynecology

## 2021-10-31 ENCOUNTER — Other Ambulatory Visit: Payer: Self-pay

## 2021-10-31 ENCOUNTER — Inpatient Hospital Stay (HOSPITAL_COMMUNITY)
Admission: AD | Admit: 2021-10-31 | Discharge: 2021-10-31 | Disposition: A | Discharge status: Home / Self Care | Payer: BC Managed Care – PPO | Attending: Obstetrics and Gynecology | Admitting: Obstetrics and Gynecology

## 2021-10-31 DIAGNOSIS — O99613 Diseases of the digestive system complicating pregnancy, third trimester: Secondary | ICD-10-CM | POA: Insufficient documentation

## 2021-10-31 DIAGNOSIS — Z20822 Contact with and (suspected) exposure to covid-19: Secondary | ICD-10-CM | POA: Insufficient documentation

## 2021-10-31 DIAGNOSIS — K529 Noninfective gastroenteritis and colitis, unspecified: Secondary | ICD-10-CM

## 2021-10-31 DIAGNOSIS — Z3A31 31 weeks gestation of pregnancy: Secondary | ICD-10-CM | POA: Diagnosis not present

## 2021-10-31 LAB — URINALYSIS, ROUTINE W REFLEX MICROSCOPIC
Bilirubin Urine: NEGATIVE
Glucose, UA: NEGATIVE mg/dL
Ketones, ur: 40 mg/dL — AB
Leukocytes,Ua: NEGATIVE
Nitrite: NEGATIVE
Protein, ur: 100 mg/dL — AB
Specific Gravity, Urine: >1.03 — ABNORMAL HIGH (ref 1.005–1.030)
pH: 6 (ref 5.0–8.0)

## 2021-10-31 LAB — RESP PANEL BY RT-PCR (FLU A&B, COVID) ARPGX2
Influenza A by PCR: NEGATIVE
Influenza B by PCR: NEGATIVE
SARS Coronavirus 2 by RT PCR: NEGATIVE

## 2021-10-31 LAB — COMPREHENSIVE METABOLIC PANEL
ALT: 12 U/L (ref 0–44)
AST: 20 U/L (ref 15–41)
Albumin: 2.7 g/dL — ABNORMAL LOW (ref 3.5–5.0)
Alkaline Phosphatase: 180 U/L — ABNORMAL HIGH (ref 38–126)
Anion gap: 12 (ref 5–15)
BUN: <5 mg/dL — ABNORMAL LOW (ref 6–20)
CO2: 20 mmol/L — ABNORMAL LOW (ref 22–32)
Calcium: 9.2 mg/dL (ref 8.9–10.3)
Chloride: 102 mmol/L (ref 98–111)
Creatinine, Ser: 0.6 mg/dL (ref 0.44–1.00)
GFR, Estimated: >60 mL/min (ref >60)
Glucose, Bld: 87 mg/dL (ref 70–99)
Potassium: 3.9 mmol/L (ref 3.5–5.1)
Sodium: 134 mmol/L — ABNORMAL LOW (ref 135–145)
Total Bilirubin: 0.6 mg/dL (ref 0.3–1.2)
Total Protein: 6.6 g/dL (ref 6.5–8.1)

## 2021-10-31 LAB — CBC
HCT: 48.6 % — ABNORMAL HIGH (ref 36.0–46.0)
Hemoglobin: 15.6 g/dL — ABNORMAL HIGH (ref 12.0–15.0)
MCH: 29.2 pg (ref 26.0–34.0)
MCHC: 32.1 g/dL (ref 30.0–36.0)
MCV: 90.8 fL (ref 80.0–100.0)
Platelets: 233 10*3/uL (ref 150–400)
RBC: 5.35 MIL/uL — ABNORMAL HIGH (ref 3.87–5.11)
RDW: 14.1 % (ref 11.5–15.5)
WBC: 18.5 10*3/uL — ABNORMAL HIGH (ref 4.0–10.5)
nRBC: 0 % (ref 0.0–0.2)

## 2021-10-31 LAB — URINALYSIS, MICROSCOPIC (REFLEX)

## 2021-10-31 MED ORDER — LACTATED RINGERS IV BOLUS
1000.0000 mL | Freq: Once | INTRAVENOUS | Status: AC
  Administered 2021-10-31: 1000 mL via INTRAVENOUS

## 2021-10-31 MED ORDER — CYCLOBENZAPRINE HCL 5 MG PO TABS
10.0000 mg | ORAL_TABLET | Freq: Once | ORAL | Status: AC
  Administered 2021-10-31: 10 mg via ORAL
  Filled 2021-10-31: qty 2

## 2021-10-31 MED ORDER — ONDANSETRON 8 MG PO TBDP: 8.0000 mg | ORAL_TABLET | Freq: Three times a day (TID) | ORAL | 0 refills | Status: DC | PRN

## 2021-10-31 MED ORDER — LOPERAMIDE HCL 2 MG PO CAPS
2.0000 mg | ORAL_CAPSULE | ORAL | Status: DC | PRN
  Administered 2021-10-31: 2 mg via ORAL
  Filled 2021-10-31: qty 1

## 2021-10-31 MED ORDER — CYCLOBENZAPRINE HCL 10 MG PO TABS: 10.0000 mg | ORAL_TABLET | Freq: Three times a day (TID) | ORAL | 0 refills | Status: DC | PRN

## 2021-10-31 MED ORDER — METOCLOPRAMIDE HCL 5 MG/ML IJ SOLN
10.0000 mg | Freq: Once | INTRAMUSCULAR | Status: AC
  Administered 2021-10-31: 10 mg via INTRAVENOUS
  Filled 2021-10-31: qty 2

## 2021-10-31 NOTE — MAU Provider Note (Signed)
Patient Debbie [name] is a 40 y.o. G3P1011  At [redacted]w[redacted]d here with complaints of nausea, vomiting, watery diarrhea and pelvic pressure. She also endorses abdominal cramping. The abdominal cramping and pelvic pressure are on-going; she also frequently has HA. The nausea and vomiting are new today. She also reports decreased fetal movements.   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 The current episode started today. The problem occurs 2 to 4 times per day. The emesis has an appearance of bile and stomach contents. There has been no fever. Associated symptoms include abdominal pain and diarrhea. Pertinent negatives include no chills, dizziness or fever.  She also reports some runny stools; abdominal pain.  OB History     Gravida  3   Para  1   Term  1   Preterm      AB  1   Living  1      SAB  0   IAB      Ectopic  1   Multiple      Live Births  1           Past Medical History:   Diagnosis Date   Anxiety    self reported   Depression    self reported   Maxillary sinus cyst    left   Migraine    Multiple thyroid nodules    3; 1.9 cm and 2 cm, 0.6 cm, will see general  surgeon   No pertinent past medical history     Past Surgical History:  Procedure Laterality Date   CESAREAN SECTION  2013   laparscopy     SHOULDER CAPSULORRHAPHY      Family History  Problem Relation Age of Onset   Cerebral aneurysm Sister    Migraines Sister    Seizures Sister    Heart disease Maternal Grandfather    Heart disease Other        mother's side   High Cholesterol Other        mother's side     Social History   Tobacco Use   Smoking status: Never   Smokeless tobacco: Never  Vaping Use   Vaping Use: Never used  Substance Use Topics   Alcohol use: No   Drug use: No    Allergies:  Allergies  Allergen Reactions   Tylenol [Acetaminophen] Anaphylaxis   Aspirin Other (See Comments)    Unknown childhood rxn   Benadryl [Diphenhydramine Hcl] Itching   Buspirone  Other reaction(s): migraines   Citalopram Hydrobromide     Other reaction(s): agitation and dizziness   Dilaudid [Hydromorphone Hcl] Itching    Pill only   Effexor Xr [Venlafaxine]     Other reaction(s): More depressed (11/2017-neurology)   Penicillins     hives   Prednisone     Swelling, hives   Sulfamethoxazole-Trimethoprim     Other reaction(s): swelling (2017)   Zoloft [Sertraline]     Other reaction(s): nausea and swelling   Latex Rash    Medications Prior to Admission  Medication Sig Dispense Refill Last Dose   loratadine (CLARITIN) 10 MG tablet Take 10 mg by mouth daily as needed for allergies.   Past Week   metoCLOPramide (REGLAN) 10 MG tablet Take 1 tablet (10 mg total) by mouth every 8 (eight) hours as needed (headache). 10 tablet 0 Past Month   ondansetron (ZOFRAN-ODT) 8 MG disintegrating tablet Take 8 mg by mouth every 8 (eight) hours as needed.   10/31/2021 at 1200   Prenatal  Vit-Fe Fumarate-FA (PRENATAL MULTIVITAMIN) TABS tablet Take 1 tablet by mouth daily at 12 noon.   10/31/2021   prochlorperazine (COMPAZINE) 10 MG tablet Take 1 tablet (10 mg total) by mouth every 8 (eight) hours as needed for nausea or vomiting (headache). 10 tablet 0 Past Month   pseudoephedrine (SUDAFED) 30 MG/5ML syrup Take by mouth 4 (four) times daily as needed for congestion.   10/31/2021   Botulinum Toxin Type A (BOTOX) 200 units SOLR Inject 155 Units as directed every 3 (three) months. Inject 155units to head and neck intramuscularly every 3 months. 1 each 3 More than a month    Review of Systems  Constitutional:  Negative for chills and fever.  Respiratory: Negative.    Gastrointestinal:  Positive for abdominal pain, diarrhea and vomiting.  Genitourinary:  Negative for vaginal bleeding, vaginal discharge and vaginal pain.  Neurological:  Negative for dizziness.  Physical Exam   Blood pressure 112/72, pulse (!) 119, temperature 98.2 F (36.8 C), temperature source Oral, resp. rate (!) 21, last menstrual period 03/26/2021, unknown if currently breastfeeding.  Physical Exam Abdominal:     General: Abdomen is flat.     Palpations: Abdomen is soft.  Musculoskeletal:        General: Normal range of motion.  Skin:    General: Skin is warm.  Neurological:     General: No focal deficit present.     Mental Status: She is alert.  Psychiatric:        Mood and Affect: Mood normal.        Behavior: Behavior normal.    MAU Course  Procedures  MDM -NST: 135 bpm, mod var, present acel, no decels, no contractions. Patient felt strong fetal movements while she was in MAU.  -Patient had one episode of diarrhea while in MAU. Afrer she received fluids and Reglan, flexeril and immodium, patient was able to tolerate PO challenge and had no more episodes of N/V/D.  -CBC and CMP are normal -UA normal although does show signs of dehydration.  -COVID and flu are negative.  Assessment and Plan    1. Gastroenteritis    -patient most likely has viral GI infection, explained BRAT diet and to return to MAU if she is unable to keep down liquids, other GI/infection warning signs reviewed -reviewed normal movements in fetus at this stage; partner and patient had question and so explanation of typical fetal movements, sleep cycles, how to monitor movements, was reviewed in detail.  -  keep appt with [facility] next week -try flexeril, immodium if diarrhea does not improve, reglan and refill given on Zofran.  -all questions answered, patient and husband agree with plan of care.   Mervyn Skeeters Briena Swingler 10/31/2021, 6:22 PM

## 2021-10-31 NOTE — MAU Note (Addendum)
...  Debbie [name] is a 40 y.o. at [redacted]w[redacted]d here in MAU reporting: DFM and nausea since noon today. She states she takes Zofran daily for nausea but today it has not brought her much relief. She states she took Zofran around noon, 20 minutes before she ate lunch, but ended up throwing up what she ate. She states she has also been experiencing lower pelvic and left lower back pain for "a while" now and sees a pelvic floor therapist but since last night her pains have increased. She states she saw her pelvic floor therapist yesterday and received dry needling to try and help alleviate the pain but it has not helped. She states it has become increasingly hard to walk and she has to shuffle her feet and has suffered with these pains even prior to pregnancy. She states she is also experiencing pale yellow diarrhea since last night. She has also had nasal congestion for one week and has been taking Sudafed. HA since this morning. No VB or LOF.   Allergic to Tylenol. Has not taken anything for pain. She states all of the pains she is experiencing is her "norm" but the decrease in fetal movement made her worry.  Pain score: 4/10 lower pelvic  3/10 left lower back 4/10 HA  FHT: 123 initial external Lab orders placed from triage: UA

## 2021-11-05 ENCOUNTER — Ambulatory Visit (INDEPENDENT_AMBULATORY_CARE_PROVIDER_SITE_OTHER): Payer: BC Managed Care – PPO | Admitting: Cardiology

## 2021-11-05 ENCOUNTER — Other Ambulatory Visit: Payer: Self-pay

## 2021-11-05 ENCOUNTER — Encounter: Payer: Self-pay | Admitting: Cardiology

## 2021-11-05 VITALS — BP 90/70 | HR 104 | Ht 63.5 in | Wt 237.2 lb

## 2021-11-05 DIAGNOSIS — R002 Palpitations: Secondary | ICD-10-CM

## 2021-11-05 DIAGNOSIS — O09529 Supervision of elderly multigravida, unspecified trimester: Secondary | ICD-10-CM | POA: Diagnosis not present

## 2021-11-05 DIAGNOSIS — O99210 Obesity complicating pregnancy, unspecified trimester: Secondary | ICD-10-CM | POA: Diagnosis not present
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
## 2021-11-05 NOTE — Patient Instructions (Signed)
Medication Instructions:  Your physician recommends that you continue on your current medications as directed. Please refer to the Current Medication list given to you today.  *If you need a refill on your cardiac medications before your next appointment, please call your pharmacy*   Lab Work: None If you have labs (blood work) drawn today and your tests are completely normal, you will receive your results only by: [place] (if you have MyChart) OR A paper copy in the mail If you have any lab test that is abnormal or we need to change your treatment, we will call you to review the results.   Testing/Procedures: None   Follow-Up: At [facility], you and your health needs are our priority.  As part of our continuing mission to provide you with exceptional heart care, we have created designated Provider Care Teams.  These Care Teams include your primary Cardiologist (physician) and Advanced Practice Providers (APPs -  Physician Assistants and Nurse Practitioners) who all work together to provide you with the care you need, when you need it.  We recommend signing up for the patient portal called "MyChart".  Sign up information is provided on this After Visit Summary.  MyChart is used to connect with patients for Virtual Visits (Telemedicine).  Patients are able to view lab/test results, encounter notes, upcoming appointments, etc.  Non-urgent messages can be sent to your provider as well.   To learn more about what you can do with MyChart, go to [email or web address].    Your next appointment:   In March  The format for your next appointment:   In Person  Provider:   Berniece Salines, DO   Other Instructions  Please take your blood pressure daily, if it is greater then 140/90 please notify us immediately.

## 2021-11-13 NOTE — Progress Notes (Signed)
[place] Phone: [phone] Subjective:   [name], am serving as a scribe for Dr. Hulan Saas. This visit occurred during the SARS-CoV-2 public health emergency.  Safety protocols were in place, including screening questions prior to the visit, additional usage of staff PPE, and extensive cleaning of exam room while observing appropriate contact time as indicated for disinfecting solutions.   I'm seeing this patient by the request  of:  Leeroy Cha, MD  CC: Low back pain with pregnancy  NIO:[id number]  Shakeema Lippman is a 40 y.o. female coming in with complaint of back and neck pain. OMT 08/08/2021. Patient states that she has good and bad days.  Patient has had some tenderness to palpation in the paraspinal musculature.  Patient states lower back has been tight.  Patient is an 34 weeks pregnancy.  Medications patient has been prescribed: None  Taking:         Reviewed prior external information including notes and imaging from previsou exam, outside providers and external EMR if available.   As well as notes that were available from care everywhere and other healthcare systems.  Past medical history, social, surgical and family history all reviewed in electronic medical record.  No pertanent information unless stated regarding to the chief complaint.   Past Medical History:  Diagnosis Date   Anxiety    self reported   Depression    self reported   Maxillary sinus cyst    left   Migraine    Multiple thyroid nodules    3; 1.9 cm and 2 cm, 0.6 cm, will see general  surgeon   No pertinent past medical history     Allergies  Allergen Reactions   Tylenol [Acetaminophen] Anaphylaxis   Aspirin Other (See Comments)    Unknown childhood rxn   Benadryl [Diphenhydramine Hcl] Itching   Buspirone     Other reaction(s): migraines   Citalopram Hydrobromide     Other reaction(s): agitation  and dizziness   Dilaudid [Hydromorphone Hcl] Itching    Pill only   Effexor Xr [Venlafaxine]     Other reaction(s): More depressed (11/2017-neurology)   Penicillins     hives   Prednisone     Swelling, hives   Sulfamethoxazole-Trimethoprim     Other reaction(s): swelling (2017)   Zoloft [Sertraline]     Other reaction(s): nausea and swelling   Latex Rash     Review of Systems:  No headache, visual changes, nausea, vomiting, diarrhea, constipation, dizziness, abdominal pain, skin rash, fevers, chills, night sweats, weight loss, swollen lymph nodes, body aches, joint swelling, chest pain, shortness of breath, mood changes. POSITIVE muscle aches  Objective  Blood pressure 110/84, pulse (!) 107, height 5' 3.5" (1.613 m), weight 237 lb (107.5 kg), last menstrual period 03/26/2021, SpO2 99 %, unknown if currently breastfeeding.   General: No apparent distress alert and oriented x3 mood and affect normal, dressed appropriately.  HEENT: Pupils equal, extraocular movements intact  Respiratory: Patient's speak in full sentences and does not appear short of breath  Cardiovascular: No lower extremity edema, non tender, no erythema  Patient is gravid.  Low back does have increasing lordosis noted.  Tightness noted noted in the paraspinal musculature bilaterally.  Neurovascularly intact distally.  Difficult to do Corky Sox secondary to body habitus.  Osteopathic findings  C2 flexed rotated and side bent right C6 flexed rotated and side bent left T3 extended rotated and side bent right inhaled  rib T9 extended rotated and side bent left L2 flexed rotated and side bent right L5 flexed rotated and side bent left Sacrum right on right       Assessment and Plan:  Degenerative disc disease, lumbar Known degenerative disc disease.  With patient's increasing progesterone with patient being in the third trimester of the pregnancy I do think that this is causing more exacerbation.  Patient responded  extremely well to muscle energy techniques today.  Patient hopefully will continue to do well.  Patient does seem to be measuring greater than her weeks of pregnancy.  Patient is scheduled to have a C-section at the beginning of February.  Follow-up with me again in 4 weeks for further evaluation and treatment.  Pelvic floor dysfunction We will continue physical therapy.  Patient is doing well overall.   Nonallopathic problems  Decision today to treat with OMT was based on Physical Exam  After verbal consent patient was treated with HVLA, ME, FPR techniques in cervical, rib, thoracic, lumbar, and sacral  areas  Patient tolerated the procedure well with improvement in symptoms  Patient given exercises, stretches and lifestyle modifications  See medications in patient instructions if given  Patient will follow up in 4-8 weeks      The above documentation has been reviewed and is accurate and complete Lyndal Pulley, DO        Note: This dictation was prepared with Dragon dictation along with smaller phrase technology. Any transcriptional errors that result from this process are unintentional.

## 2021-11-14 ENCOUNTER — Encounter: Payer: Self-pay | Admitting: Family Medicine

## 2021-11-14 ENCOUNTER — Other Ambulatory Visit: Payer: Self-pay

## 2021-11-14 ENCOUNTER — Ambulatory Visit (INDEPENDENT_AMBULATORY_CARE_PROVIDER_SITE_OTHER): Payer: BC Managed Care – PPO | Admitting: Family Medicine

## 2021-11-14 VITALS — BP 110/84 | HR 107 | Ht 63.5 in | Wt 237.0 lb

## 2021-11-14 DIAGNOSIS — M5136 Other intervertebral disc degeneration, lumbar region: Secondary | ICD-10-CM | POA: Diagnosis not present

## 2021-11-14 DIAGNOSIS — M6289 Other specified disorders of muscle: Secondary | ICD-10-CM | POA: Diagnosis not present

## 2021-11-14 DIAGNOSIS — M9901 Segmental and somatic dysfunction of cervical region: Secondary | ICD-10-CM

## 2021-11-14 DIAGNOSIS — M9902 Segmental and somatic dysfunction of thoracic region: Secondary | ICD-10-CM | POA: Diagnosis not present

## 2021-11-14 DIAGNOSIS — M51369 Other intervertebral disc degeneration, lumbar region without mention of lumbar back pain or lower extremity pain: Secondary | ICD-10-CM

## 2021-11-14 DIAGNOSIS — M9908 Segmental and somatic dysfunction of rib cage: Secondary | ICD-10-CM

## 2021-11-14 DIAGNOSIS — M9904 Segmental and somatic dysfunction of sacral region: Secondary | ICD-10-CM | POA: Diagnosis not present

## 2021-11-14 DIAGNOSIS — M9903 Segmental and somatic dysfunction of lumbar region: Secondary | ICD-10-CM | POA: Diagnosis not present

## 2021-11-14 NOTE — Patient Instructions (Signed)
Happy Holidays See me in 4 weeks for one more round before the big day

## 2021-11-14 NOTE — Assessment & Plan Note (Signed)
Known degenerative disc disease.  With patient's increasing progesterone with patient being in the third trimester of the pregnancy I do think that this is causing more exacerbation.  Patient responded extremely well to muscle energy techniques today.  Patient hopefully will continue to do well.  Patient does seem to be measuring greater than her weeks of pregnancy.  Patient is scheduled to have a C-section at the beginning of February.  Follow-up with me again in 4 weeks for further evaluation and treatment.

## 2021-11-14 NOTE — Assessment & Plan Note (Signed)
We will continue physical therapy.  Patient is doing well overall.

## 2021-11-21 ENCOUNTER — Encounter: Payer: Self-pay | Admitting: *Deleted

## 2021-11-21 ENCOUNTER — Other Ambulatory Visit: Payer: Self-pay

## 2021-11-21 ENCOUNTER — Encounter (HOSPITAL_COMMUNITY): Payer: Self-pay | Admitting: Obstetrics & Gynecology

## 2021-11-21 ENCOUNTER — Inpatient Hospital Stay (HOSPITAL_COMMUNITY)
Admission: AD | Admit: 2021-11-21 | Discharge: 2021-11-21 | Disposition: A | Discharge status: Home / Self Care | Payer: BC Managed Care – PPO | Attending: Obstetrics and Gynecology | Admitting: Obstetrics and Gynecology

## 2021-11-21 ENCOUNTER — Ambulatory Visit: Payer: BC Managed Care – PPO | Admitting: *Deleted

## 2021-11-21 ENCOUNTER — Ambulatory Visit (HOSPITAL_BASED_OUTPATIENT_CLINIC_OR_DEPARTMENT_OTHER): Payer: BC Managed Care – PPO

## 2021-11-21 VITALS — BP 125/97 | HR 96

## 2021-11-21 DIAGNOSIS — R110 Nausea: Secondary | ICD-10-CM | POA: Insufficient documentation

## 2021-11-21 DIAGNOSIS — Z87820 Personal history of traumatic brain injury: Secondary | ICD-10-CM | POA: Diagnosis not present

## 2021-11-21 DIAGNOSIS — Z3689 Encounter for other specified antenatal screening: Secondary | ICD-10-CM | POA: Diagnosis not present

## 2021-11-21 DIAGNOSIS — O09523 Supervision of elderly multigravida, third trimester: Secondary | ICD-10-CM | POA: Insufficient documentation

## 2021-11-21 DIAGNOSIS — E669 Obesity, unspecified: Secondary | ICD-10-CM

## 2021-11-21 DIAGNOSIS — O26893 Other specified pregnancy related conditions, third trimester: Secondary | ICD-10-CM | POA: Insufficient documentation

## 2021-11-21 DIAGNOSIS — Z3A34 34 weeks gestation of pregnancy: Secondary | ICD-10-CM

## 2021-11-21 DIAGNOSIS — O1403 Mild to moderate pre-eclampsia, third trimester: Secondary | ICD-10-CM | POA: Insufficient documentation

## 2021-11-21 DIAGNOSIS — O99213 Obesity complicating pregnancy, third trimester: Secondary | ICD-10-CM | POA: Insufficient documentation

## 2021-11-21 HISTORY — DX: Unspecified ovarian cyst, unspecified side: N83.209

## 2021-11-21 HISTORY — DX: Essential (primary) hypertension: I10

## 2021-11-21 HISTORY — DX: Urinary tract infection, site not specified: N39.0

## 2021-11-21 LAB — CBC
HCT: 44.2 % (ref 36.0–46.0)
Hemoglobin: 15 g/dL (ref 12.0–15.0)
MCH: 29 pg (ref 26.0–34.0)
MCHC: 33.9 g/dL (ref 30.0–36.0)
MCV: 85.3 fL (ref 80.0–100.0)
Platelets: 282 10*3/uL (ref 150–400)
RBC: 5.18 MIL/uL — ABNORMAL HIGH (ref 3.87–5.11)
RDW: 13.2 % (ref 11.5–15.5)
WBC: 11.2 10*3/uL — ABNORMAL HIGH (ref 4.0–10.5)
nRBC: 0 % (ref 0.0–0.2)

## 2021-11-21 LAB — COMPREHENSIVE METABOLIC PANEL
ALT: 12 U/L (ref 0–44)
AST: 19 U/L (ref 15–41)
Albumin: 2.7 g/dL — ABNORMAL LOW (ref 3.5–5.0)
Alkaline Phosphatase: 291 U/L — ABNORMAL HIGH (ref 38–126)
Anion gap: 10 (ref 5–15)
BUN: <5 mg/dL — ABNORMAL LOW (ref 6–20)
CO2: 21 mmol/L — ABNORMAL LOW (ref 22–32)
Calcium: 9 mg/dL (ref 8.9–10.3)
Chloride: 100 mmol/L (ref 98–111)
Creatinine, Ser: 0.71 mg/dL (ref 0.44–1.00)
GFR, Estimated: >60 mL/min (ref >60)
Glucose, Bld: 106 mg/dL — ABNORMAL HIGH (ref 70–99)
Potassium: 3.6 mmol/L (ref 3.5–5.1)
Sodium: 131 mmol/L — ABNORMAL LOW (ref 135–145)
Total Bilirubin: 0.5 mg/dL (ref 0.3–1.2)
Total Protein: 6.6 g/dL (ref 6.5–8.1)

## 2021-11-21 LAB — PROTEIN / CREATININE RATIO, URINE
Creatinine, Urine: 246.6 mg/dL
Protein Creatinine Ratio: 0.58 mg/mg{Cre} — ABNORMAL HIGH (ref 0.00–0.15)
Total Protein, Urine: 142 mg/dL

## 2021-11-21 MED ORDER — METOCLOPRAMIDE HCL 5 MG/ML IJ SOLN
10.0000 mg | Freq: Once | INTRAMUSCULAR | Status: AC
  Administered 2021-11-21: 11:00:00 10 mg via INTRAVENOUS
  Filled 2021-11-21: qty 2

## 2021-11-21 MED ORDER — SODIUM CHLORIDE 0.9 % IV SOLN
8.0000 mg | Freq: Once | INTRAVENOUS | Status: AC
  Administered 2021-11-21: 11:00:00 8 mg via INTRAVENOUS
  Filled 2021-11-21: qty 4

## 2021-11-21 MED ORDER — LACTATED RINGERS IV BOLUS
1000.0000 mL | Freq: Once | INTRAVENOUS | Status: AC
  Administered 2021-11-21: 11:00:00 1000 mL via INTRAVENOUS

## 2021-11-21 MED ORDER — CYCLOBENZAPRINE HCL 5 MG PO TABS
10.0000 mg | ORAL_TABLET | Freq: Once | ORAL | Status: AC
  Administered 2021-11-21: 11:00:00 10 mg via ORAL
  Filled 2021-11-21: qty 2

## 2021-11-21 NOTE — MAU Note (Addendum)
Sent from [facility] for pre-E work up.  BP elevated, pt reports was elevated at home yesterday(137/97). No bleeding or leaking.  HA since last night, did not take anything- allergic to Tylenol.  Spots and blurring in rt eye, denies epigastric pain, reports joint pain in hands from swelling. +FM. Reports cramping/contreactions since last night.

## 2021-11-21 NOTE — MAU Provider Note (Signed)
History     [id number]  Arrival date and time: 11/21/21 0901    Chief Complaint  Patient presents with   Hypertension   Headache     HPI Debbie [name] is a 40 y.o. at [redacted]w[redacted]d by LMP c/w 15 wk [place] with PMHx notable for possible cHTN, migraines, TBI, who presents for elevated BP and headache.   Review of outside prenatal records from [facility] (in media tab): records available only from November, at that point no abnormal blood pressures noted in office.  Received signout from Dr. Donalee Citrin that patient was seen for growth [place] and BPP earlier today, growth [place] normal w EFW 96% and BPP 8/8, however patient's BP was 125/97 and patient endorsed headache, nausea, and blurry vision, so was sent to MAU for further evaluation.   Patient reports she has been getting elevated BP readings at home for the past several days She has also had a constant L frontal headache for the past few days Also reports blurry vision even when using her glasses Has been taking reglan and flexeril for her headache at home but not very effective Denies chest pain, shortness of breath, RUQ pain, lower extremity edema     OB History     Gravida  3   Para  1   Term  1   Preterm      AB  1   Living  1      SAB  0   IAB      Ectopic  1   Multiple      Live Births  1        Obstetric Comments  1-breech         Past Medical History:  Diagnosis Date   Anxiety    self reported   Chronic back pain 2021   low back/hip; had nerve ablation in lumbar/sacral joint   Depression    self reported   Hypertension    Maxillary sinus cyst    left   Migraine    Multiple thyroid nodules    3; 1.9 cm and 2 cm, 0.6 cm, will see general  surgeon   No pertinent past medical history    Ovarian cyst    UTI (urinary tract infection)     Past Surgical History:  Procedure Laterality Date   CESAREAN SECTION  2013   laparscopy  2012   ruptured ectopic   SHOULDER CAPSULORRHAPHY Right      Family History  Problem Relation Age of Onset   Hypertension Mother    Diabetes Father    Dementia Father    Cerebral aneurysm Sister    Migraines Sister    Seizures Sister    Heart disease Maternal Grandfather    Heart disease Other        mother's side   High Cholesterol Other        mother's side     Social History   Socioeconomic History   Marital status: Married    Spouse name: Not on file   Number of children: 1   Years of education: Not on file   Highest education level: Bachelor's degree (e.g., BA, AB, BS)  Occupational History   Not on file  Tobacco Use   Smoking status: Never   Smokeless tobacco: Never  Vaping Use   Vaping Use: Never used  Substance and Sexual Activity   Alcohol use: Not Currently   Drug use: No   Sexual activity: Not Currently  Birth control/protection: None  Other Topics Concern   Not on file  Social History Narrative   Lives at home with her son & his father   Right handed   Drinks 1 cup of caffeine daily   Social Determinants of Health   Financial Resource Strain: Not on file  Food Insecurity: No Food Insecurity   Worried About Charity fundraiser in the Last Year: Never true   Arboriculturist in the Last Year: Never true  Transportation Needs: Unknown   Lack of Transportation (Medical): No   Lack of Transportation (Non-Medical): Not on file  Physical Activity: Not on file  Stress: Not on file  Social Connections: Not on file  Intimate Partner Violence: Not on file    Allergies  Allergen Reactions   Tylenol [Acetaminophen] Anaphylaxis   Aspirin Other (See Comments)    Unknown childhood rxn   Benadryl [Diphenhydramine Hcl] Itching   Buspirone     Other reaction(s): migraines   Citalopram Hydrobromide     Other reaction(s): agitation and dizziness   Dilaudid [Hydromorphone Hcl] Itching    Pill only   Effexor Xr [Venlafaxine]     Other reaction(s): More depressed (11/2017-neurology)   Penicillins     hives    Prednisone     Swelling, hives   Sulfamethoxazole-Trimethoprim     Other reaction(s): swelling (2017)   Zoloft [Sertraline]     Other reaction(s): nausea and swelling   Latex Rash    No current facility-administered medications on file prior to encounter.   Current Outpatient Medications on File Prior to Encounter  Medication Sig Dispense Refill   cyclobenzaprine (FLEXERIL) 10 MG tablet Take 1 tablet (10 mg total) by mouth 3 (three) times daily as needed for muscle spasms. 30 tablet 0   loratadine (CLARITIN) 10 MG tablet Take 10 mg by mouth daily as needed for allergies.     metoCLOPramide (REGLAN) 10 MG tablet Take 1 tablet (10 mg total) by mouth every 8 (eight) hours as needed (headache). 10 tablet 0   ondansetron (ZOFRAN-ODT) 8 MG disintegrating tablet Take 1 tablet (8 mg total) by mouth every 8 (eight) hours as needed for nausea or vomiting. 60 tablet 0   Prenatal Vit-Fe Fumarate-FA (PRENATAL MULTIVITAMIN) TABS tablet Take 1 tablet by mouth daily at 12 noon.     prochlorperazine (COMPAZINE) 10 MG tablet Take 1 tablet (10 mg total) by mouth every 8 (eight) hours as needed for nausea or vomiting (headache). 10 tablet 0   Botulinum Toxin Type A (BOTOX) 200 units SOLR Inject 155 Units as directed every 3 (three) months. Inject 155units to head and neck intramuscularly every 3 months. 1 each 3   pantoprazole (PROTONIX) 40 MG tablet Take 40 mg by mouth daily.       ROS Pertinent positives and negative per HPI, all others reviewed and negative  Physical Exam   BP 121/84    Pulse 84    Temp 98 F (36.7 C) (Oral)    Resp 18    Ht 5\' 3"  (1.6 m)    Wt 106.4 kg    LMP 03/26/2021 (Approximate)    SpO2 99%    BMI 41.56 kg/m   Patient Vitals for the past 24 hrs:  BP Temp Temp src Pulse Resp SpO2 Height Weight  11/21/21 1116 121/84 -- -- 84 -- -- -- --  11/21/21 1101 (!) 116/97 -- -- 98 -- -- -- --  11/21/21 1046 (!) 124/92 -- -- (!) 105 -- -- -- --  11/21/21 1034 (!) 118/92 -- -- (!) 110  -- -- -- --  11/21/21 1032 (!) 133/103 -- -- (!) 106 -- -- -- --  11/21/21 1016 (!) 138/96 -- -- 100 -- -- -- --  11/21/21 1001 (!) 122/92 -- -- (!) 105 -- -- -- --  11/21/21 0945 117/86 -- -- (!) 104 -- 99 % -- --  11/21/21 0931 125/83 98 F (36.7 C) Oral (!) 105 18 -- 5\' 3"  (1.6 m) 106.4 kg  11/21/21 0929 (!) 118/92 -- -- 100 -- -- -- --    Physical Exam Vitals reviewed.  Constitutional:      General: She is not in acute distress.    Appearance: She is well-developed. She is not diaphoretic.  Eyes:     General: No scleral icterus. Pulmonary:     Effort: Pulmonary effort is normal. No respiratory distress.  Skin:    General: Skin is warm and dry.  Neurological:     Mental Status: She is alert.     Coordination: Coordination normal.     Cervical Exam    Bedside Ultrasound Not done  My interpretation: n/a  FHT Baseline 145, moderate variability, +accels, no decels Toco: rare contractions Cat: I  Labs Results for orders placed or performed during the hospital encounter of 11/21/21 (from the past 24 hour(s))  Protein / creatinine ratio, urine     Status: Abnormal   Collection Time: 11/21/21  9:22 AM  Result Value Ref Range   Creatinine, Urine 246.60 mg/dL   Total Protein, Urine 142 mg/dL   Protein Creatinine Ratio 0.58 (H) 0.00 - 0.15 mg/mg[Cre]  CBC     Status: Abnormal   Collection Time: 11/21/21  9:24 AM  Result Value Ref Range   WBC 11.2 (H) 4.0 - 10.5 K/uL   RBC 5.18 (H) 3.87 - 5.11 MIL/uL   Hemoglobin 15.0 12.0 - 15.0 g/dL   HCT 44.2 36.0 - 46.0 %   MCV 85.3 80.0 - 100.0 fL   MCH 29.0 26.0 - 34.0 pg   MCHC 33.9 30.0 - 36.0 g/dL   RDW 13.2 11.5 - 15.5 %   Platelets 282 150 - 400 K/uL   nRBC 0.0 0.0 - 0.2 %  Comprehensive metabolic panel     Status: Abnormal   Collection Time: 11/21/21  9:24 AM  Result Value Ref Range   Sodium 131 (L) 135 - 145 mmol/L   Potassium 3.6 3.5 - 5.1 mmol/L   Chloride 100 98 - 111 mmol/L   CO2 21 (L) 22 - 32 mmol/L    Glucose, Bld 106 (H) 70 - 99 mg/dL   BUN <5 (L) 6 - 20 mg/dL   Creatinine, Ser 0.71 0.44 - 1.00 mg/dL   Calcium 9.0 8.9 - 10.3 mg/dL   Total Protein 6.6 6.5 - 8.1 g/dL   Albumin 2.7 (L) 3.5 - 5.0 g/dL   AST 19 15 - 41 U/L   ALT 12 0 - 44 U/L   Alkaline Phosphatase 291 (H) 38 - 126 U/L   Total Bilirubin 0.5 0.3 - 1.2 mg/dL   GFR, Estimated >60 >60 mL/min   Anion gap 10 5 - 15    Imaging [place] MFM FETAL BPP WO NON STRESS  Result Date: 11/21/2021 ----------------------------------------------------------------------  OBSTETRICS REPORT                       (Signed Final 11/21/2021 09:02 am) ---------------------------------------------------------------------- Patient Info  ID #:  [id number]                          D.O.B.:  May 14, 1981 (40 yrs)  Name:       CHEMERE STEFFLER                     Visit Date: 11/21/2021 07:36 am ---------------------------------------------------------------------- Performed By  Attending:        Tama High MD        Ref. Address:     [facility]                                                             [address]                                                             [address], [place]  Performed By:     Jacob Moores BS,       Location:         [facility]                    RDMS, RVT                                [facility]                                                             [place] [facility]                                                             [facility]  Referred By:      Christophe Louis MD ---------------------------------------------------------------------- Orders  #  Description                           Code  Ordered By  1  [place] MFM FETAL BPP WO NON               [id number]    YU FANG     STRESS  2  [place] MFM OB FOLLOW UP                   [id number]    YU FANG  ----------------------------------------------------------------------  #  Order #                     Accession #                Episode #  1  [id number]                   [id number]                 [id number]  2  [id number]                   [id number]                 [id number] ---------------------------------------------------------------------- Indications  [redacted] weeks gestation of pregnancy                K0X.38  Obesity complicating pregnancy, third          O99.213  trimester (BMI 41)  Pyelectasis of fetus on prenatal ultrasound    O28.3  Advanced maternal age multigravida 6+,        O70.523  third trimester (40 yo)  Previous cesarean delivery, antepartum         O34.219  (Breech)  Medical complication of pregnancy (Covid +     O26.90  in pregnancy)  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 Cord Insertion:      Previously Visualized  Amniotic Fluid  AFI FV:      Within normal limits  AFI Sum(cm)     %Tile       Largest Pocket(cm)  14.7            52          4.7  RUQ(cm)       RLQ(cm)       LUQ(cm)        LLQ(cm)  4.7           3.1           3.2            3.7 ---------------------------------------------------------------------- Biophysical Evaluation  Amniotic F.V:   Pocket => 2 cm             F. Tone:        Observed  F. Movement:    Observed                   Score:          8/8  F. Breathing:   Observed ----------------------------------------------------------------------  Biometry  BPD:      86.7  mm     G. Age:  35w 0d         69  %    CI:        75.92   %    70 - 86                                                          FL/HC:      22.3   %    19.4  - 21.8  HC:      315.4  mm     G. Age:  35w 3d         42  %    HC/AC:      0.94        0.96 - 1.11  AC:      334.8  mm     G. Age:  37w 3d       > 99  %    FL/BPD:     81.1   %    71 - 87  FL:       70.3  mm     G. Age:  36w 0d         84  %    FL/AC:      21.0   %    20 - 24  LV:        3.9  mm  Est. FW:    2970  gm      6 lb 9 oz     96  % ---------------------------------------------------------------------- OB History  Gravidity:    3         Term:   1        Prem:   0        SAB:   0  TOP:          0       Ectopic:  1        Living: 1 ---------------------------------------------------------------------- Gestational Age  LMP:           34w 2d        Date:  03/26/21                 EDD:   12/31/21  U/S Today:     36w 0d                                        EDD:   12/19/21  Best:          34w 2d     Det. By:  LMP  (03/26/21)          EDD:   12/31/21 ---------------------------------------------------------------------- Anatomy  Cranium:               Appears normal         LVOT:                   Not well visualized  Cavum:                 Appears normal         Aortic Arch:  Previously seen  Ventricles:            Appears normal         Ductal Arch:            Not well visualized  Choroid Plexus:        Previously seen        Diaphragm:              Appears normal  Cerebellum:            Previously seen        Stomach:                Appears normal, left                                                                        sided  Posterior Fossa:       Previously seen        Abdomen:                Appears normal  Nuchal Fold:           Previously seen        Abdominal Wall:         Previously seen  Face:                  Orbits nl previously;  Cord Vessels:           Previously seen                         profile not well vis  Lips:                  Previously seen        Kidneys:                Appear normal  Palate:                Not well visualized    Bladder:                Appears normal   Thoracic:              Previously seen        Spine:                  Previously seen  Heart:                 Previously seen        Upper Extremities:      Visualized                                                                        previously  RVOT:                  Not well visualized    Lower Extremities:      Visualized  previously  Other:  Female gender previously seen. Technically difficult due to maternal          habitus and fetal position. ---------------------------------------------------------------------- Cervix Uterus Adnexa  Cervix  Not visualized (advanced GA >24wks)  Uterus  No abnormality visualized.  Right Ovary  Within normal limits.  Left Ovary  Within normal limits.  Cul De Sac  No free fluid seen.  Adnexa  No abnormality visualized. ---------------------------------------------------------------------- Impression  Patient returned for fetal growth assessment.  She does not  have gestational diabetes.  The estimated fetal weight is at the 96 percentile.  Amniotic  fluid is normal good fetal activity seen.  Antenatal testing is  reassuring.  BPP 8/8.  Blood pressure today at her office is 125/97 mmHg.  Patient  complains of mild headache, nausea and blurry vision.  No  history of vaginal bleeding.  Her blood pressure was then  increased at one of her prenatal visits earlier and the  diagnosis of chronic hypertension was entertained.  I explained the possibility of  preeclampsia and  recommended evaluation at the MAU because of her  symptoms.  Patient agreed to go to the MAU.  I discussed with Dr. Dione Plover. ---------------------------------------------------------------------- Recommendations  - MAU evaluation today.  -Continue weekly BPP till delivery.  -If patient continues to have intermittent hypertension and the  diagnosis of chronic hypertension is unclear, I recommend  delivery at [redacted] weeks gestation.  ----------------------------------------------------------------------                  Tama High, MD Electronically Signed Final Report   11/21/2021 09:02 am ----------------------------------------------------------------------  [place] MFM OB FOLLOW UP  Result Date: 11/21/2021 ----------------------------------------------------------------------  OBSTETRICS REPORT                       (Signed Final 11/21/2021 09:02 am) ---------------------------------------------------------------------- Patient Info  ID #:       [id number]                          D.O.B.:  Jan 31, 1981 (40 yrs)  Name:       Debbie [name]                     Visit Date: 11/21/2021 07:36 am ---------------------------------------------------------------------- Performed By  Attending:        Tama High MD        Ref. Address:     [facility]                                                             [address]  [place], [place]  Performed By:     Jacob Moores BS,       Location:         [facility]                    RDMS, RVT                                [facility]                                                             [place] [facility]                                                             [facility]  Referred By:      Christophe Louis MD ---------------------------------------------------------------------- Orders  #  Description                           Code        Ordered By  1  [place] MFM FETAL BPP WO NON               76819.01    YU FANG     STRESS  2  [place] MFM OB FOLLOW UP                   76816.01    YU FANG ----------------------------------------------------------------------  #  Order #                     Accession #                Episode #  1  [id number]                   [id number]                 [id number]  2  [id number]                    [id number]                 [id number] ---------------------------------------------------------------------- Indications  [redacted] weeks gestation of pregnancy                E9F.81  Obesity complicating pregnancy, third          O99.213  trimester (BMI 58)  Pyelectasis of fetus on prenatal ultrasound    O28.3  Advanced maternal age multigravida 58+,        O60.523  third trimester (40 yo)  Previous cesarean delivery, antepartum  O34.219  (Breech)  Medical complication of pregnancy (Covid +     O26.90  in pregnancy)  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 Cord Insertion:      Previously Visualized  Amniotic Fluid  AFI FV:      Within normal limits  AFI Sum(cm)     %Tile       Largest Pocket(cm)  14.7            52          4.7  RUQ(cm)       RLQ(cm)       LUQ(cm)        LLQ(cm)  4.7           3.1           3.2            3.7 ---------------------------------------------------------------------- Biophysical Evaluation  Amniotic F.V:   Pocket => 2 cm             F. Tone:        Observed  F. Movement:    Observed                   Score:          8/8  F. Breathing:   Observed ---------------------------------------------------------------------- Biometry  BPD:      86.7  mm     G. Age:  35w 0d         69  %    CI:        75.92   %    70 - 86                                                          FL/HC:      22.3   %    19.4 - 21.8  HC:      315.4  mm     G. Age:  35w 3d         42  %    HC/AC:      0.94        0.96 - 1.11  AC:      334.8  mm     G. Age:  37w 3d       > 99  %    FL/BPD:     81.1   %    71 - 87  FL:       70.3  mm     G. Age:  36w 0d          84  %    FL/AC:      21.0   %    20 - 24  LV:  3.9  mm  Est. FW:    2970  gm      6 lb 9 oz     96  % ---------------------------------------------------------------------- OB History  Gravidity:    3         Term:   1        Prem:   0        SAB:   0  TOP:          0       Ectopic:  1        Living: 1 ---------------------------------------------------------------------- Gestational Age  LMP:           34w 2d        Date:  03/26/21                 EDD:   12/31/21  U/S Today:     36w 0d                                        EDD:   12/19/21  Best:          34w 2d     Det. By:  LMP  (03/26/21)          EDD:   12/31/21 ---------------------------------------------------------------------- Anatomy  Cranium:               Appears normal         LVOT:                   Not well visualized  Cavum:                 Appears normal         Aortic Arch:            Previously seen  Ventricles:            Appears normal         Ductal Arch:            Not well visualized  Choroid Plexus:        Previously seen        Diaphragm:              Appears normal  Cerebellum:            Previously seen        Stomach:                Appears normal, left                                                                        sided  Posterior Fossa:       Previously seen        Abdomen:                Appears normal  Nuchal Fold:           Previously seen        Abdominal Wall:         Previously seen  Face:  Orbits nl previously;  Cord Vessels:           Previously seen                         profile not well vis  Lips:                  Previously seen        Kidneys:                Appear normal  Palate:                Not well visualized    Bladder:                Appears normal  Thoracic:              Previously seen        Spine:                  Previously seen  Heart:                 Previously seen        Upper Extremities:      Visualized                                                                         previously  RVOT:                  Not well visualized    Lower Extremities:      Visualized                                                                        previously  Other:  Female gender previously seen. Technically difficult due to maternal          habitus and fetal position. ---------------------------------------------------------------------- Cervix Uterus Adnexa  Cervix  Not visualized (advanced GA >24wks)  Uterus  No abnormality visualized.  Right Ovary  Within normal limits.  Left Ovary  Within normal limits.  Cul De Sac  No free fluid seen.  Adnexa  No abnormality visualized. ---------------------------------------------------------------------- Impression  Patient returned for fetal growth assessment.  She does not  have gestational diabetes.  The estimated fetal weight is at the 96 percentile.  Amniotic  fluid is normal good fetal activity seen.  Antenatal testing is  reassuring.  BPP 8/8.  Blood pressure today at her office is 125/97 mmHg.  Patient  complains of mild headache, nausea and blurry vision.  No  history of vaginal bleeding.  Her blood pressure was then  increased at one of her prenatal visits earlier and the  diagnosis of chronic hypertension was entertained.  I explained the possibility of  preeclampsia and  recommended evaluation at the MAU because of her  symptoms.  Patient agreed to go to the MAU.  I discussed with Dr. Dione Plover. ---------------------------------------------------------------------- Recommendations  - MAU evaluation  today.  -Continue weekly BPP till delivery.  -If patient continues to have intermittent hypertension and the  diagnosis of chronic hypertension is unclear, I recommend  delivery at [redacted] weeks gestation. ----------------------------------------------------------------------                  Tama High, MD Electronically Signed Final Report   11/21/2021 09:02 am ----------------------------------------------------------------------   MAU Course   Procedures Lab Orders         CBC         Comprehensive metabolic panel         Protein / creatinine ratio, urine    Meds ordered this encounter  Medications   lactated ringers bolus 1,000 mL   ondansetron (ZOFRAN) 8 mg in sodium chloride 0.9 % 50 mL IVPB   metoCLOPramide (REGLAN) injection 10 mg   cyclobenzaprine (FLEXERIL) tablet 10 mg   Imaging Orders  No imaging studies ordered today    MDM moderate  Assessment and Plan  #Pre-eclampsia, mild #[redacted] weeks gestation of pregnancy Patient presenting with mild range BP's and headache/blurry vision. Much improved after treatment with IVF and IV/PO meds. Labs notable for P:C of 0.58; on review of chart still not clear whether patient meets criteria for cHTN or if this is new mild PreE, regardless given creeping blood pressures management will be the same. Discussed with Dr. Landry Mellow, she is in agreement that patient is OK for discharge and she will plan to move delivery up to 37 weeks. Reviewed PreE precautions with patient prior to discharge. Patient has visit with Dr. Landry Mellow later today.   #FWB FHT Cat I NST: Reactive  Dispo: discharged to home in stable condition.     Clarnce Flock, MD/MPH 11/21/21 11:59 AM  Allergies as of 11/21/2021       Reactions   Tylenol [acetaminophen] Anaphylaxis   Aspirin Other (See Comments)   Unknown childhood rxn   Benadryl [diphenhydramine Hcl] Itching   Buspirone    Other reaction(s): migraines   Citalopram Hydrobromide    Other reaction(s): agitation and dizziness   Dilaudid [hydromorphone Hcl] Itching   Pill only   Effexor Xr [venlafaxine]    Other reaction(s): More depressed (11/2017-neurology)   Penicillins    hives   Prednisone    Swelling, hives   Sulfamethoxazole-trimethoprim    Other reaction(s): swelling (2017)   Zoloft [sertraline]    Other reaction(s): nausea and swelling   Latex Rash        Medication List     TAKE these medications    Botox 200 units  Solr Generic drug: Botulinum Toxin Type A Inject 155 Units as directed every 3 (three) months. Inject 155units to head and neck intramuscularly every 3 months.   cyclobenzaprine 10 MG tablet Commonly known as: FLEXERIL Take 1 tablet (10 mg total) by mouth 3 (three) times daily as needed for muscle spasms.   loratadine 10 MG tablet Commonly known as: CLARITIN Take 10 mg by mouth daily as needed for allergies.   metoCLOPramide 10 MG tablet Commonly known as: Reglan Take 1 tablet (10 mg total) by mouth every 8 (eight) hours as needed (headache).   ondansetron 8 MG disintegrating tablet Commonly known as: ZOFRAN-ODT Take 1 tablet (8 mg total) by mouth every 8 (eight) hours as needed for nausea or vomiting.   pantoprazole 40 MG tablet Commonly known as: PROTONIX Take 40 mg by mouth daily.   prenatal multivitamin Tabs tablet Take 1 tablet by mouth daily at 12 noon.  prochlorperazine 10 MG tablet Commonly known as: COMPAZINE Take 1 tablet (10 mg total) by mouth every 8 (eight) hours as needed for nausea or vomiting (headache).

## 2021-11-22 ENCOUNTER — Other Ambulatory Visit: Payer: Self-pay | Admitting: *Deleted

## 2021-11-22 DIAGNOSIS — O99213 Obesity complicating pregnancy, third trimester: Secondary | ICD-10-CM

## 2021-11-22 DIAGNOSIS — O34219 Maternal care for unspecified type scar from previous cesarean delivery: Secondary | ICD-10-CM

## 2021-11-22 DIAGNOSIS — O09523 Supervision of elderly multigravida, third trimester: Secondary | ICD-10-CM

## 2021-11-22 DIAGNOSIS — G40909 Epilepsy, unspecified, not intractable, without status epilepticus: Secondary | ICD-10-CM

## 2021-11-26 ENCOUNTER — Inpatient Hospital Stay (HOSPITAL_COMMUNITY): Payer: BC Managed Care – PPO | Admitting: Anesthesiology

## 2021-11-26 ENCOUNTER — Encounter: Payer: Self-pay | Admitting: Neurology

## 2021-11-26 ENCOUNTER — Encounter (HOSPITAL_COMMUNITY): Payer: Self-pay | Admitting: Obstetrics and Gynecology

## 2021-11-26 ENCOUNTER — Inpatient Hospital Stay (HOSPITAL_COMMUNITY)
Admission: AD | Admit: 2021-11-26 | Discharge: 2021-11-29 | DRG: 785 | Disposition: A | Discharge status: Home / Self Care | Payer: BC Managed Care – PPO | Attending: Obstetrics and Gynecology | Admitting: Obstetrics and Gynecology

## 2021-11-26 ENCOUNTER — Encounter (HOSPITAL_COMMUNITY)
Admission: AD | Disposition: A | Discharge status: Home / Self Care | Payer: Self-pay | Source: Home / Self Care | Attending: Obstetrics and Gynecology

## 2021-11-26 DIAGNOSIS — O1414 Severe pre-eclampsia complicating childbirth: Principal | ICD-10-CM | POA: Diagnosis present

## 2021-11-26 DIAGNOSIS — O34211 Maternal care for low transverse scar from previous cesarean delivery: Secondary | ICD-10-CM | POA: Diagnosis present

## 2021-11-26 DIAGNOSIS — Z20822 Contact with and (suspected) exposure to covid-19: Secondary | ICD-10-CM | POA: Diagnosis present

## 2021-11-26 DIAGNOSIS — Z888 Allergy status to other drugs, medicaments and biological substances status: Secondary | ICD-10-CM | POA: Diagnosis not present

## 2021-11-26 DIAGNOSIS — O1413 Severe pre-eclampsia, third trimester: Secondary | ICD-10-CM | POA: Diagnosis not present

## 2021-11-26 DIAGNOSIS — O35EXX0 Maternal care for other (suspected) fetal abnormality and damage, fetal genitourinary anomalies, not applicable or unspecified: Secondary | ICD-10-CM | POA: Diagnosis present

## 2021-11-26 DIAGNOSIS — O99214 Obesity complicating childbirth: Secondary | ICD-10-CM | POA: Diagnosis present

## 2021-11-26 DIAGNOSIS — Z880 Allergy status to penicillin: Secondary | ICD-10-CM | POA: Diagnosis not present

## 2021-11-26 DIAGNOSIS — Z302 Encounter for sterilization: Secondary | ICD-10-CM

## 2021-11-26 DIAGNOSIS — O34219 Maternal care for unspecified type scar from previous cesarean delivery: Secondary | ICD-10-CM | POA: Diagnosis present

## 2021-11-26 DIAGNOSIS — Z3A35 35 weeks gestation of pregnancy: Secondary | ICD-10-CM | POA: Diagnosis not present

## 2021-11-26 DIAGNOSIS — Z3689 Encounter for other specified antenatal screening: Secondary | ICD-10-CM

## 2021-11-26 DIAGNOSIS — M25561 Pain in right knee: Secondary | ICD-10-CM | POA: Diagnosis not present

## 2021-11-26 DIAGNOSIS — G43109 Migraine with aura, not intractable, without status migrainosus: Secondary | ICD-10-CM

## 2021-11-26 LAB — COMPREHENSIVE METABOLIC PANEL
ALT: 14 U/L (ref 0–44)
AST: 22 U/L (ref 15–41)
Albumin: 2.7 g/dL — ABNORMAL LOW (ref 3.5–5.0)
Alkaline Phosphatase: 317 U/L — ABNORMAL HIGH (ref 38–126)
Anion gap: 11 (ref 5–15)
BUN: <5 mg/dL — ABNORMAL LOW (ref 6–20)
CO2: 22 mmol/L (ref 22–32)
Calcium: 9.6 mg/dL (ref 8.9–10.3)
Chloride: 101 mmol/L (ref 98–111)
Creatinine, Ser: 0.69 mg/dL (ref 0.44–1.00)
GFR, Estimated: >60 mL/min (ref >60)
Glucose, Bld: 80 mg/dL (ref 70–99)
Potassium: 4.3 mmol/L (ref 3.5–5.1)
Sodium: 134 mmol/L — ABNORMAL LOW (ref 135–145)
Total Bilirubin: 0.7 mg/dL (ref 0.3–1.2)
Total Protein: 6.7 g/dL (ref 6.5–8.1)

## 2021-11-26 LAB — CBC WITH DIFFERENTIAL/PLATELET
Abs Immature Granulocytes: 0.03 10*3/uL (ref 0.00–0.07)
Basophils Absolute: 0 10*3/uL (ref 0.0–0.1)
Basophils Relative: 0 %
Eosinophils Absolute: 0.1 10*3/uL (ref 0.0–0.5)
Eosinophils Relative: 1 %
HCT: 42.8 % (ref 36.0–46.0)
Hemoglobin: 14.6 g/dL (ref 12.0–15.0)
Immature Granulocytes: 0 %
Lymphocytes Relative: 28 %
Lymphs Abs: 3.1 10*3/uL (ref 0.7–4.0)
MCH: 28.9 pg (ref 26.0–34.0)
MCHC: 34.1 g/dL (ref 30.0–36.0)
MCV: 84.8 fL (ref 80.0–100.0)
Monocytes Absolute: 1 10*3/uL (ref 0.1–1.0)
Monocytes Relative: 9 %
Neutro Abs: 6.9 10*3/uL (ref 1.7–7.7)
Neutrophils Relative %: 62 %
Platelets: 252 10*3/uL (ref 150–400)
RBC: 5.05 MIL/uL (ref 3.87–5.11)
RDW: 13.5 % (ref 11.5–15.5)
WBC: 11.1 10*3/uL — ABNORMAL HIGH (ref 4.0–10.5)
nRBC: 0 % (ref 0.0–0.2)

## 2021-11-26 LAB — TYPE AND SCREEN
ABO/RH(D): A POS
Antibody Screen: NEGATIVE

## 2021-11-26 LAB — PROTEIN / CREATININE RATIO, URINE
Creatinine, Urine: 52.78 mg/dL
Protein Creatinine Ratio: 0.63 mg/mg{Cre} — ABNORMAL HIGH (ref 0.00–0.15)
Total Protein, Urine: 33 mg/dL

## 2021-11-26 LAB — RESP PANEL BY RT-PCR (FLU A&B, COVID) ARPGX2
Influenza A by PCR: NEGATIVE
Influenza B by PCR: NEGATIVE
SARS Coronavirus 2 by RT PCR: NEGATIVE

## 2021-11-26 SURGERY — Surgical Case
Anesthesia: Spinal

## 2021-11-26 MED ORDER — NALOXONE HCL 0.4 MG/ML IJ SOLN: 0.4000 mg | INTRAMUSCULAR | Status: DC | PRN

## 2021-11-26 MED ORDER — DOCUSATE SODIUM 100 MG PO CAPS: 100.0000 mg | ORAL_CAPSULE | Freq: Every day | ORAL | Status: DC

## 2021-11-26 MED ORDER — LABETALOL HCL 5 MG/ML IV SOLN: 20.0000 mg | INTRAVENOUS | Status: DC | PRN

## 2021-11-26 MED ORDER — LACTATED RINGERS IV SOLN: INTRAVENOUS | Status: DC

## 2021-11-26 MED ORDER — STERILE WATER FOR IRRIGATION IR SOLN
Status: DC | PRN
  Administered 2021-11-26: 1000 mL

## 2021-11-26 MED ORDER — SOD CITRATE-CITRIC ACID 500-334 MG/5ML PO SOLN
ORAL | Status: AC
  Administered 2021-11-26: 30 mL
  Filled 2021-11-26: qty 30

## 2021-11-26 MED ORDER — MORPHINE SULFATE (PF) 0.5 MG/ML IJ SOLN
INTRAMUSCULAR | Status: AC
  Filled 2021-11-26: qty 10

## 2021-11-26 MED ORDER — CALCIUM CARBONATE ANTACID 500 MG PO CHEW: 2.0000 | CHEWABLE_TABLET | ORAL | Status: DC | PRN

## 2021-11-26 MED ORDER — CLINDAMYCIN PHOSPHATE 900 MG/50ML IV SOLN
INTRAVENOUS | Status: AC
  Filled 2021-11-26: qty 50

## 2021-11-26 MED ORDER — ONDANSETRON HCL 4 MG/2ML IJ SOLN
INTRAMUSCULAR | Status: AC
  Filled 2021-11-26: qty 2

## 2021-11-26 MED ORDER — FENTANYL CITRATE (PF) 100 MCG/2ML IJ SOLN
INTRAMUSCULAR | Status: DC | PRN
  Administered 2021-11-26: 15 ug via INTRATHECAL

## 2021-11-26 MED ORDER — DEXAMETHASONE SODIUM PHOSPHATE 4 MG/ML IJ SOLN
INTRAMUSCULAR | Status: AC
  Filled 2021-11-26: qty 1

## 2021-11-26 MED ORDER — SCOPOLAMINE 1 MG/3DAYS TD PT72: 1.0000 | MEDICATED_PATCH | Freq: Once | TRANSDERMAL | Status: DC

## 2021-11-26 MED ORDER — DROPERIDOL 2.5 MG/ML IJ SOLN: 0.6250 mg | Freq: Once | INTRAMUSCULAR | Status: DC | PRN

## 2021-11-26 MED ORDER — PHENYLEPHRINE HCL-NACL 20-0.9 MG/250ML-% IV SOLN
INTRAVENOUS | Status: DC | PRN
  Administered 2021-11-26: 60 ug/min via INTRAVENOUS

## 2021-11-26 MED ORDER — EPHEDRINE 5 MG/ML INJ
INTRAVENOUS | Status: AC
  Filled 2021-11-26: qty 5

## 2021-11-26 MED ORDER — PRENATAL MULTIVITAMIN CH: 1.0000 | ORAL_TABLET | Freq: Every day | ORAL | Status: DC

## 2021-11-26 MED ORDER — BUPIVACAINE IN DEXTROSE 0.75-8.25 % IT SOLN
INTRATHECAL | Status: DC | PRN
  Administered 2021-11-26: 1.6 mL via INTRATHECAL

## 2021-11-26 MED ORDER — ZOLPIDEM TARTRATE 5 MG PO TABS: 5.0000 mg | ORAL_TABLET | Freq: Every evening | ORAL | Status: DC | PRN

## 2021-11-26 MED ORDER — SODIUM CHLORIDE 0.9 % IV SOLN: INTRAVENOUS | Status: DC | PRN

## 2021-11-26 MED ORDER — FENTANYL CITRATE (PF) 100 MCG/2ML IJ SOLN: 25.0000 ug | INTRAMUSCULAR | Status: DC | PRN

## 2021-11-26 MED ORDER — GENTAMICIN SULFATE 40 MG/ML IJ SOLN
5.0000 mg/kg | INTRAVENOUS | Status: AC
  Administered 2021-11-26: 540 mg via INTRAVENOUS
  Filled 2021-11-26: qty 13.5

## 2021-11-26 MED ORDER — METOCLOPRAMIDE HCL 5 MG/ML IJ SOLN
10.0000 mg | Freq: Once | INTRAMUSCULAR | Status: AC
  Administered 2021-11-26: 10 mg via INTRAMUSCULAR
  Filled 2021-11-26: qty 2

## 2021-11-26 MED ORDER — LABETALOL HCL 5 MG/ML IV SOLN: 40.0000 mg | INTRAVENOUS | Status: DC | PRN

## 2021-11-26 MED ORDER — PHENYLEPHRINE HCL (PRESSORS) 10 MG/ML IV SOLN
INTRAVENOUS | Status: DC | PRN
  Administered 2021-11-26: 120 ug via INTRAVENOUS

## 2021-11-26 MED ORDER — HYDRALAZINE HCL 20 MG/ML IJ SOLN: 5.0000 mg | INTRAMUSCULAR | Status: DC | PRN

## 2021-11-26 MED ORDER — PHENYLEPHRINE 40 MCG/ML (10ML) SYRINGE FOR IV PUSH (FOR BLOOD PRESSURE SUPPORT)
PREFILLED_SYRINGE | INTRAVENOUS | Status: AC
  Filled 2021-11-26: qty 10

## 2021-11-26 MED ORDER — ONDANSETRON HCL 4 MG/2ML IJ SOLN
4.0000 mg | Freq: Three times a day (TID) | INTRAMUSCULAR | Status: DC | PRN
  Administered 2021-11-26: 4 mg via INTRAVENOUS
  Filled 2021-11-26: qty 2

## 2021-11-26 MED ORDER — OXYTOCIN-SODIUM CHLORIDE 30-0.9 UT/500ML-% IV SOLN
INTRAVENOUS | Status: AC
  Filled 2021-11-26: qty 500

## 2021-11-26 MED ORDER — PROMETHAZINE HCL 25 MG/ML IJ SOLN: 6.2500 mg | INTRAMUSCULAR | Status: DC | PRN

## 2021-11-26 MED ORDER — SODIUM CHLORIDE 0.9 % IR SOLN
Status: DC | PRN
  Administered 2021-11-26: 1000 mL

## 2021-11-26 MED ORDER — MEPERIDINE HCL 25 MG/ML IJ SOLN: 6.2500 mg | INTRAMUSCULAR | Status: DC | PRN

## 2021-11-26 MED ORDER — HYDRALAZINE HCL 20 MG/ML IJ SOLN: 10.0000 mg | INTRAMUSCULAR | Status: DC | PRN

## 2021-11-26 MED ORDER — ONDANSETRON HCL 4 MG/2ML IJ SOLN
INTRAMUSCULAR | Status: DC | PRN
  Administered 2021-11-26: 4 mg via INTRAVENOUS

## 2021-11-26 MED ORDER — MORPHINE SULFATE (PF) 0.5 MG/ML IJ SOLN
INTRAMUSCULAR | Status: DC | PRN
  Administered 2021-11-26: .15 mg via INTRATHECAL

## 2021-11-26 MED ORDER — OXYTOCIN-SODIUM CHLORIDE 30-0.9 UT/500ML-% IV SOLN
INTRAVENOUS | Status: DC | PRN
  Administered 2021-11-26: 30 [IU] via INTRAVENOUS

## 2021-11-26 MED ORDER — FENTANYL CITRATE (PF) 100 MCG/2ML IJ SOLN
INTRAMUSCULAR | Status: DC | PRN
  Administered 2021-11-26: 35 ug via INTRAVENOUS

## 2021-11-26 MED ORDER — SODIUM CHLORIDE 0.9% FLUSH: 3.0000 mL | INTRAVENOUS | Status: DC | PRN

## 2021-11-26 MED ORDER — DEXMEDETOMIDINE (PRECEDEX) IN NS 20 MCG/5ML (4 MCG/ML) IV SYRINGE
PREFILLED_SYRINGE | INTRAVENOUS | Status: DC | PRN
  Administered 2021-11-26 (×4): 4 ug via INTRAVENOUS

## 2021-11-26 MED ORDER — FENTANYL CITRATE (PF) 100 MCG/2ML IJ SOLN
INTRAMUSCULAR | Status: AC
  Filled 2021-11-26: qty 2

## 2021-11-26 MED ORDER — CLINDAMYCIN PHOSPHATE 900 MG/50ML IV SOLN
900.0000 mg | INTRAVENOUS | Status: AC
  Administered 2021-11-26: 900 mg via INTRAVENOUS

## 2021-11-26 MED ORDER — NALOXONE HCL 4 MG/10ML IJ SOLN
1.0000 ug/kg/h | INTRAVENOUS | Status: DC | PRN
  Filled 2021-11-26: qty 5

## 2021-11-26 MED ORDER — ONDANSETRON HCL 4 MG/2ML IJ SOLN: 4.0000 mg | Freq: Three times a day (TID) | INTRAMUSCULAR | Status: DC | PRN

## 2021-11-26 MED ORDER — MAGNESIUM SULFATE 40 GM/1000ML IV SOLN
2.0000 g/h | INTRAVENOUS | Status: AC
  Administered 2021-11-26 – 2021-11-27 (×3): 2 g/h via INTRAVENOUS
  Filled 2021-11-26 (×2): qty 1000

## 2021-11-26 MED ORDER — MAGNESIUM SULFATE BOLUS VIA INFUSION
4.0000 g | Freq: Once | INTRAVENOUS | Status: AC
  Administered 2021-11-26: 4 g via INTRAVENOUS
  Filled 2021-11-26: qty 1000

## 2021-11-26 SURGICAL SUPPLY — 41 items
BARRIER ADHS 3X4 INTERCEED (GAUZE/BANDAGES/DRESSINGS) IMPLANT
BENZOIN TINCTURE PRP APPL 2/3 (GAUZE/BANDAGES/DRESSINGS) ×2 IMPLANT
BINDER ABDOMINAL 10 UNV 27-48 (MISCELLANEOUS) IMPLANT
BINDER ABDOMINAL 12 ML 46-62 (SOFTGOODS) IMPLANT
CHLORAPREP W/TINT 26ML (MISCELLANEOUS) ×2 IMPLANT
CLAMP CORD UMBIL (MISCELLANEOUS) IMPLANT
CLOTH BEACON ORANGE TIMEOUT ST (SAFETY) ×2 IMPLANT
DRSG OPSITE POSTOP 4X10 (GAUZE/BANDAGES/DRESSINGS) ×2 IMPLANT
ELECT REM PT RETURN 9FT ADLT (ELECTROSURGICAL) ×2
ELECTRODE REM PT RTRN 9FT ADLT (ELECTROSURGICAL) ×1 IMPLANT
EXCISOR BIOPSY CONE FISHER (MISCELLANEOUS) ×1 IMPLANT
EXTRACTOR VACUUM KIWI (MISCELLANEOUS) IMPLANT
GLOVE BIOGEL M 7.0 STRL (GLOVE) ×4 IMPLANT
GLOVE BIOGEL PI IND STRL 7.0 (GLOVE) ×3 IMPLANT
GLOVE BIOGEL PI INDICATOR 7.0 (GLOVE) ×3
GOWN STRL REUS W/TWL LRG LVL3 (GOWN DISPOSABLE) ×6 IMPLANT
HEMOSTAT ARISTA ABSORB 3G PWDR (HEMOSTASIS) ×1 IMPLANT
KIT ABG SYR 3ML LUER SLIP (SYRINGE) IMPLANT
LIGASURE IMPACT 36 18CM CVD LR (INSTRUMENTS) ×1 IMPLANT
NDL HYPO 25X5/8 SAFETYGLIDE (NEEDLE) IMPLANT
NEEDLE HYPO 25X5/8 SAFETYGLIDE (NEEDLE) IMPLANT
NS IRRIG 1000ML POUR BTL (IV SOLUTION) ×2 IMPLANT
PACK C SECTION WH (CUSTOM PROCEDURE TRAY) ×2 IMPLANT
PAD OB MATERNITY 4.3X12.25 (PERSONAL CARE ITEMS) ×2 IMPLANT
PENCIL SMOKE EVAC W/HOLSTER (ELECTROSURGICAL) ×2 IMPLANT
RTRCTR C-SECT PINK 25CM LRG (MISCELLANEOUS) IMPLANT
SPONGE T-LAP 18X18 ~~LOC~~+RFID (SPONGE) ×5 IMPLANT
STRIP CLOSURE SKIN 1/2X4 (GAUZE/BANDAGES/DRESSINGS) ×2 IMPLANT
SUT MNCRL 0 VIOLET CTX 36 (SUTURE) ×2 IMPLANT
SUT MONOCRYL 0 CTX 36 (SUTURE) ×2
SUT PDS AB 0 CT1 27 (SUTURE) ×4 IMPLANT
SUT PLAIN 0 NONE (SUTURE) IMPLANT
SUT VIC AB 2-0 CT1 27 (SUTURE) ×1
SUT VIC AB 2-0 CT1 TAPERPNT 27 (SUTURE) ×1 IMPLANT
SUT VIC AB 3-0 SH 27 (SUTURE)
SUT VIC AB 3-0 SH 27X BRD (SUTURE) IMPLANT
SUT VIC AB 4-0 KS 27 (SUTURE) ×2 IMPLANT
TOWEL OR 17X24 6PK STRL BLUE (TOWEL DISPOSABLE) ×2 IMPLANT
TRAY FOLEY W/BAG SLVR 14FR LF (SET/KITS/TRAYS/PACK) ×2 IMPLANT
WATER STERILE IRR 1000ML POUR (IV SOLUTION) ×2 IMPLANT
YANKAUER SUCT BULB TIP NO VENT (SUCTIONS) ×1 IMPLANT

## 2021-11-26 NOTE — Op Note (Signed)
Pre Op Dx:   1. Single live IUP at [redacted]w[redacted]d 2. Preeclampsia with severe features 3. History of cesarean section, desires repeat 4. History of tubal ectopic pregnancy s/p right salpingectomy 4. Desires permanent sterilization  Post Op Dx:  Same as pre-operative diagnoses  Procedure:   1. Low Transverse Cesarean Section 2. Left salpingectomy 3. Partial right salpingectomy  Surgeon:  Dr. Drema Dallas Assistants:  Wilford Sports, CNM Anesthesia:  Spinal  EBL:  532cc  IVF:  1000cc UOP:  150cc  Drains:  Foley catheter  Specimen removed:  Placenta - sent to pathology   Left fallopian tube and portion of right fallopain tube - sent to pathology   Omental nodule biopsy - sent to pathology Device(s) implanted:  None Case Type:  Clean-contaminated Findings: Normal-appearing uterus. Palpable ~3cm right fundal fibroid. Unable to exteriorize uterus. Omental adhesions to anterior abdominal wall. A 1.5cm dark grey, hardened omental nodule. Complications: None  Indications:  41 y.o. [id number] at [redacted]w[redacted]d with preeclampsia with severe features. She was diagnosed with preeclampsia without severe features on 12/29 and today had persistent headache and scotomata unresolved by her usual medications. She endorsed desire for permanent sterilization at the time of cesarean section.  Procedure:  After informed consent was obtained, the patient was brought to the operating room.  Following administration of anesthesia, the patient was positioned in dorsal supine position with a leftward tilt and was prepped and draped in sterile fashion.  A preoperative time-out was performed.  The abdomen was entered in layers through a pfannenstiel incision. The omental adhesions were taken down bluntly and with electrosurgery. An Alexis retractor was placed.  A low transverse hysterotomy was created sharply to the level of the membranes, then extended bluntly.  The fetus was delivered from cephalic presentation onto the field.   Bulb suctioning was performed.  The cord was doubly clamped and cut after an approximately 45 second pause.  The newborn was passed to the warmer.  The placenta was delivered.  The uterus was swept free of clots and debris and closed in a running locked fashion with 0-Monocryl. Several figure-of-eight sutures were placed along the left lateral portion of the hysterotomy for additional hemostasis. Hemostasis was verified. The left fallopian tube was identified by its fimbriated end and lifted with a Babcock. The mesosalpinx was divided using the Ligasure device to the level of the uterus, dessicated thoroughly and divided from the uterus. The remainder of right fallopian tube was noted to be at least 4cm long and appeared to have some attachment to the right ovary. Verbal consent from the patient obtained to remove the remainder of this right fallopian tube. The portion of right fallopian tube was divided at the mesosalpinx using the Ligasure, dessciated thoroughly, and divided from the uterus. The abdomen was irrigated with warmed saline and cleared of clots. The Ligasure divide was used to divide the portion of omentum which contained the omental nodule as noted above - adequate hemostasis was noted.  Subfascial spaces were inspected and hemostasis assured. Arista powder was placed atop the rectus muscle bellies. The fascia was closed in a running fashion with 0-PDS.  The subcutaneous tissues were irrigated and hemostasis assured.  The subcutaneous tissues were closed with 3-0 Monocryl.  The skin was closed with 4-0 Vicryl.  A sterile bandage was applied.  The patient was transferred to PACU.  All needle, sponge, and instrument counts were correct at the end of the case.   Disposition:  PACU  I performed the procedure  and the assistant was needed due to the complexity of the anatomy.  Drema Dallas, DO

## 2021-11-26 NOTE — H&P (Addendum)
HPI: 41 y.o. G3P1011 @ 106w0d estimated gestational age (as dated by LMP c/w 9 week ultrasound) presents for headache, tachycardia, palpitations, swollen hands, and dizziness. Patient had pre-existing diagnosis of preeclampsia without severe features based on elevated pressures in MAU with PCR of 0.5 diagnosed on 11/21/21.  In the office today, patient was diaphoretic with dizziness and blurred vision in her right eye and grey spots in the left eye. She has a history of migraines for which only Reglan relieves. She received Reglan in MAU which brought headache from 8/10 to 5/10 but states it is a persistent throbbing in the right side and behind the right eye. States this headache is very different than her usual headache which is left-sided and located parietal region. Reports RUQ pain that began today and significant swelling in bilateral UE. She has had persistent headaches. She is unable to take Tylenol  for headaches due to allergy. Reports anaphylactic allergy to steroids such as prednisone.  Patient endorses desire for permanent sterilization. States she does not desire future fertility. She desired permanent sterilization since the start of this pregnancy. History of right salpingectomy from ectopic pregnancy.   Leakage of fluid:  No Vaginal bleeding:  No Contractions:  No Fetal movement:  Yes  Prenatal care has been provided by Dr. Christophe Louis [facility])  ROS:  Denies fevers, chills, chest pain, visual changes, SOB, RUQ/epigastric pain, N/V, dysuria, hematuria, or sudden onset/worsening bilateral LE or facial edema.  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) Anxiety/depression History of ectopic pregnancy (s/p right salpingectomy in 2012) Chronic  headaches  Prenatal Transfer Tool  Maternal Diabetes: No Genetic Screening: Declined Maternal Ultrasounds/Referrals: Normal Fetal Ultrasounds or other Referrals:  Referred to Materal Fetal Medicine  Maternal Substance Abuse:  No Significant Maternal Medications:  Meds include: Other: Reglan Significant Maternal Lab Results: Other: Unknown   Prenatal Labs Blood type:  A Positive Antibody screen:  Negative CBC:  H/H 12.7/36.2 Rubella: Immune RPR:  Non-reactive Hep B:  Negative Hep C:  Negative HIV:  Negative GC/CT:  Negative Glucola:  162 (abnormal)   3h GTT   Immunizations: Tdap: Received 11/07/21  OBHx:  OB History     Gravida  3   Para  1   Term  1   Preterm      AB  1   Living  1      SAB  0   IAB      Ectopic  1   Multiple      Live Births  1        Obstetric Comments  1-breech        PMHx:  See above Meds:  PNV, Reglan 10mg  q8h PRN, Flexeril 10mg  q8h PRN, Zofran 8mg   Allergy:   Allergies  Allergen Reactions   Tylenol [Acetaminophen] Anaphylaxis   Aspirin Other (See Comments)    Unknown childhood rxn   Benadryl [Diphenhydramine Hcl] Itching   Buspirone     Other reaction(s): migraines   Citalopram Hydrobromide     Other reaction(s): agitation and dizziness   Dilaudid [Hydromorphone Hcl] Itching    Pill only   Effexor Xr [Venlafaxine]     Other reaction(s): More depressed (11/2017-neurology)   Penicillins     hives   Prednisone     Swelling,  hives   Sulfamethoxazole-Trimethoprim     Other reaction(s): swelling (2017)   Zoloft [Sertraline]     Other reaction(s): nausea and swelling   Latex Rash   SurgHx: History of left salpingectomy for ectopic pregnancy (exploratory lapaorotomy), History of cesarean section SocHx:   Denies Tobacco, ETOH, illicit drugs  O: BP (!) 128/94    Pulse (!) 103    Temp 98.1 F (36.7 C) (Oral)    Resp 20    Ht 5\' 3"  (1.6 m)    Wt 107.5 kg    LMP 03/26/2021 (Approximate)    SpO2 97%    BMI 41.98  kg/m  Gen. AAOx3, appears uncomfortable, breathing through contractions CV.  RRR  Resp. CTAB, no wheezes/rales/rhonchi Abd. Gravid, soft, non-tender throughout, no rebound/guarding Extr.  Trace bilateral LE edema, no calf tenderness bilaterally Neuro: 3+ right brachioradialis DTRs, 2+ left brachioradialis DTRs, no clonus SVE: closed/thick/high, posterior   Last [place] (11/21/21): BPP 8/8  Last growth [place] (10/23/21):  Indications  Obesity complicating pregnancy, third          O99.213  trimester (BMI 41)  Pyelectasis of fetus on prenatal ultrasound    O28.3  Advanced maternal age multigravida 24+,        O79.523  third trimester (41 yo)  Previous cesarean delivery, antepartum         O34.219  (Breech)  Medical complication of pregnancy (Covid +     O26.90  in pregnancy)  Seizure disorder (Cervical bone spur           O99.350 G40.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 Cord Insertion:      Previously Visualized  Amniotic Fluid  AFI FV:      Within normal limits  AFI Sum(cm)     %Tile       Largest Pocket(cm)  14.66           51          5.5  RUQ(cm)       RLQ(cm)       LUQ(cm)        LLQ(cm)  5.02          5.5           1.59           2.55 ---------------------------------------------------------------------- Biometry  BPD:      77.1  mm     G. Age:  31w 0d         63  %    CI:        71.73   %    70 - 86  FL/HC:      20.6   %    19.2 - 21.4  HC:      289.8  mm     G. Age:  31w 6d         66  %    HC/AC:      1.03        0.99 - 1.21  AC:      280.4  mm     G. Age:  32w  1d         92  %    FL/BPD:     77.6   %    71 - 87  FL:       59.8  mm     G. Age:  31w 1d         64  %    FL/AC:      21.3   %    20 - 24  HUM:      56.1  mm     G. Age:  32w 5d         95  %  LV:        3.2  mm  Est. FW:    1816  gm           4 lb     87  % ---------------------------------------------------------------------- OB History  Gravidity:    3         Term:   1        Prem:   0        SAB:   0  TOP:          0       Ectopic:  1        Living: 1 ---------------------------------------------------------------------- Gestational Age  LMP:           30w 1d        Date:  03/26/21                 EDD:   12/31/21  U/S Today:     31w 4d                                        EDD:   12/21/21  Best:          30w 1d     Det. By:  LMP  (03/26/21)          EDD:   12/31/21 ---------------------------------------------------------------------- Anatomy  Cranium:               Appears normal         LVOT:                   Not well visualized  Cavum:                 Appears normal         Aortic Arch:            Previously seen  Ventricles:            Appears normal         Ductal Arch:            Not well visualized  Choroid Plexus:        Previously seen        Diaphragm:  Appears normal  Cerebellum:            Previously seen        Stomach:                Appears normal, left                                                                        sided  Posterior Fossa:       Previously seen        Abdomen:                Appears normal  Nuchal Fold:           Previously seen        Abdominal Wall:         Previously seen  Face:                  Orbits nl previously;  Cord Vessels:           Previously seen                         profile not well vis  Lips:                  Previously seen        Kidneys:                Left UTD  47mm  Palate:                Not well visualized    Bladder:                Appears normal  Thoracic:              Previously seen        Spine:                   Previously seen  Heart:                 Previously seen        Upper Extremities:      Visualized                                                                        previously  RVOT:                  Not well visualized    Lower Extremities:      Visualized                                                                        previously  Other:  Female  gender previously seen. Technically difficult due to maternal          habitus and fetal position. ---------------------------------------------------------------------- Cervix Uterus Adnexa  Cervix  Not visualized (advanced GA >24wks)  Uterus  No abnormality visualized. ----------------------------------------------------------------------  FHT: Category I Toco: q2-3 min  Labs: see orders  A/P:  41 y.o. G3P1011 @ [redacted]w[redacted]d who is admitted for preeclampsia with severe features (headache). She desires permanent sterilization at the time of procedure.  Preeclampsia with severe features - Ad[facility] to [facility] care - Admit labs (CBC, T&S, COVID screen) - Repeat CBC, CMP unremarkable, PCR 0.63 - CEFM/Toco - FWB:  Category I at time of evaluation - Diet:  NPO - IVF:  LR at 125cc/hour - Magnesium sulfate for seizure prophylaxis - Antibiotics: Gent/Clinda on call to OR due to PCN allergy (hives) - VTE Prophylaxis:  SCDs - GBS Status:  Unknown - Presentation:  Cephalic on last [place]  Headache persistent and different than usual after treatment with normal Reglan and unable to take other agents (Tylenol or Benadryl). We discussed recommendation for delivery. Counseled patient on risk of NICU admission for infant. Unable to administer BMZ due to previous anaphylactic allergy to Prednisone.  Consents: I have explained to the patient that this surgery is performed to deliver their baby or babies through an incision in the abdomen and incision in the uterus.  Prior to surgery, the risks and benefits of the surgery, as well as  alternative treatments were discussed.  The risks include, but are not limited to, possible need for cesarean delivery for all future pregnancies, bleeding at the time of surgery that could necessitate a blood transfusion and/or hysterectomy, rupture of the uterus during a future pregnancy that could cause a preterm delivery and/or requiring hysterectomy, infection, damage to surrounding organs and tissues, damage to bladder, damage to ureters, causing kidney damage, and requiring additional procedures, damage to bowels, resulting in further surgery, postoperative pain, short-term and long-term, scarring on the abdominal wall and intra-abdominally, need for further surgery, development of an incisional hernia, deep vein thrombosis and/or pulmonary embolism, wound infection and/or separation, painful intercourse, urinary leakage, impact on future pregnancies including but not limited to, abnormal location or attachment of the placenta to the uterus, such as placenta previa or accreta, that may necessitate a blood transfusion and/or hysterectomy, impact on total family size, complications the course of which cannot be predicted or prevented, and death. Patient was consented for unilateral salpingectomy for desire for permanent sterilization. She understands that this is a permanent procedure and cannot be reversed. Reviewed benefits of salpingectomy which include potential decrease risk of ovarian cancer.  Patient was counseled on the risks, benefits and alternatives of bilateral tubal sterilization.  Like any surgery this procedure carries risks including to but not limited to infection, damage to surrounding structures requiring additional surgery, thromboembolism, and even death.  She understands that there is a failure rate of approximently 1-2%, and if she were to get pregnant after a tubal sterilization, there is a 50% chance of ectopic pregnancy.  She was advised that if she should miss her period andor have  a positive pregnancy test after her bilateral tubal sterilization, she should seek medical assistance.  She is aware that this is a permanent procedure and that reversal is available but is very expensive and only 50% effective.  Patient was consented for blood products.  The patient is aware that bleeding may result in the need for a blood transfusion which includes risk of transmission of  HIV (1:2 million), Hepatitis C (1:2 million), and Hepatitis B (1:200 thousand) and transfusion reaction.  Patient voiced understanding of the above risks as well as understanding of indications for blood transfusion.  Drema Dallas, DO

## 2021-11-26 NOTE — Progress Notes (Signed)
Patient stated that nausea has subsided.

## 2021-11-26 NOTE — MAU Note (Signed)
Was at [facility] appt, pulse was 150, kept spiking up.  BP was elevated again.  Feels dizzy or like she is underwater.  Head is just swimming.  Having a lot of pressure in rt ear. +HA, rt eye is still blurring, dull ache in RUQ. Increased swelling in hands and arms. Getting hot and cold.  A lot of nausea.

## 2021-11-26 NOTE — Anesthesia Preprocedure Evaluation (Addendum)
Anesthesia Evaluation  Patient identified by MRN, date of birth, ID band Patient awake    Reviewed: Allergy & Precautions, NPO status , Patient's Chart, lab work & pertinent test results  Airway Mallampati: III  TM Distance: >3 FB Neck ROM: Full    Dental no notable dental hx.    Pulmonary neg pulmonary ROS,    Pulmonary exam normal breath sounds clear to auscultation       Cardiovascular hypertension, negative cardio ROS Normal cardiovascular exam Rhythm:Regular Rate:Normal     Neuro/Psych  Headaches, PSYCHIATRIC DISORDERS Anxiety Depression  Neuromuscular disease (myofascial pain )    GI/Hepatic negative GI ROS, Neg liver ROS,   Endo/Other  Morbid obesity  Renal/GU negative Renal ROS  negative genitourinary   Musculoskeletal  (+) Arthritis , Chronic back pain with h/o nerve ablations   Abdominal   Peds negative pediatric ROS (+)  Hematology negative hematology ROS (+)   Anesthesia Other Findings   Reproductive/Obstetrics (+) Pregnancy                           Anesthesia Physical Anesthesia Plan  ASA: 3  Anesthesia Plan: Spinal   Post-op Pain Management:    Induction:   PONV Risk Score and Plan: 2 and Scopolamine patch - Pre-op, Treatment may vary due to age or medical condition, Ondansetron and Dexamethasone  Airway Management Planned: Natural Airway  Additional Equipment: None  Intra-op Plan:   Post-operative Plan:   Informed Consent: I have reviewed the patients History and Physical, chart, labs and discussed the procedure including the risks, benefits and alternatives for the proposed anesthesia with the patient or authorized representative who has indicated his/her understanding and acceptance.       Plan Discussed with: CRNA and Anesthesiologist  Anesthesia Plan Comments: (Spinal. GETA as backup plan.  Reviewed patient's allergies. Patient states she is OK to  receive intrathecal morphine, IV dilaudid (NOT po), hydrocodone (NOT oxycodone). She states IV morphine does not help her pain. She has baseline headaches and was already having one preoperatively being treated with dim lighting and an ice pack. Norton Blizzard, MD  )       Anesthesia Quick Evaluation

## 2021-11-26 NOTE — Anesthesia Procedure Notes (Signed)
Spinal  Patient location during procedure: OR Start time: 11/26/2021 10:32 PM End time: 11/26/2021 10:37 PM Reason for block: surgical anesthesia Staffing Performed: anesthesiologist  Anesthesiologist: Merlinda Frederick, MD Preanesthetic Checklist Completed: patient identified, IV checked, risks and benefits discussed, surgical consent, monitors and equipment checked, pre-op evaluation and timeout performed Spinal Block Patient position: sitting Prep: DuraPrep Patient monitoring: cardiac monitor, continuous pulse ox and blood pressure Approach: midline Location: L3-4 Injection technique: single-shot Needle Needle type: Pencan  Needle gauge: 24 G Needle length: 9 cm Assessment Sensory level: T6 Events: CSF return Additional Notes Functioning IV was confirmed and monitors were applied. Sterile prep and drape, including hand hygiene and sterile gloves were used. The patient was positioned and the spine was prepped. The skin was anesthetized with lidocaine.  Free flow of clear CSF was obtained prior to injecting local anesthetic into the CSF.  The spinal needle aspirated freely following injection.  The needle was carefully withdrawn.  The patient tolerated the procedure well.

## 2021-11-26 NOTE — MAU Provider Note (Signed)
History     CSN: [id number]  Arrival date and time: 11/26/21 1217   Event Date/Time   First Provider Initiated Contact with Patient 11/26/21 1346      Chief Complaint  Patient presents with   Headache   Hypertension   Dizziness   Tachycardia   swollen hands   Ms. Debbie [name] is a 41 y.o. G3P1011 at [redacted]w[redacted]d who presents to MAU for preeclampsia evaluation after she was seen in the office today and had a spike in her HR to the 150s, elevated BP, headache and "the provider didn't like how I was walking." Patient clarifies that she was very dizzy, so she was walking holding on to the wall and her son because she did not feel like she could walk straight. Patient also reports blurry vision in her right eye and gray spots in her right eye. Patient reports she also feels like her ears are ringing and she is nauseous, which she reports does happen with her migraines.  Patient reports regular migraines, which she takes Reglan for. Patient also reports that the changes in her vision are normal for her migraines too. Patient reports she has not taken her Reglan today and reports she takes it at 9PM and 12noon for headaches and nausea prevention. Patient reports her headache never truly goes away and has been on-going for several months. Patient rates current HA as 7/10. Patient reports when she takes Reglan it drops the pain from a 8/10 to 2/10. Patient reports she gets Botox injections every 12 weeks, which work to control her migraines, but reports she gets worsening migraines the week before her next injection is due. Patient reports both migraines and "pressure headaches." Patient reports the pressure headaches are always present, but sometimes it escalates to a migraine.   Patient reports she is scheduled to have a C/S on 12/10/2021, but was told that she might not wait that long.  Pt denies vomiting, epigastric pain, swelling in face and hands, sudden weight gain. Pt denies chest pain and SOB.  Pt  denies constipation, diarrhea, or urinary problems. Pt denies fever, chills, fatigue, sweating or changes in appetite. Pt denies dizziness, light-headedness, weakness.  Pt denies VB, ctx, LOF and reports good FM.  Current pregnancy problems? Preeclampsia,  Blood Type? A Positive Allergies? Tylenol, aspirin, Benadryl, buspar, citalopram, dilaudid, Effexor, PCN, Prednisone, Bactrim, Zoloft, latex Current medications? Reglan (last took 9PM), Zofran, Pantoprazole, Flexeril (pelvic pain), PNV Current PNC & next appt? [facility], next appt 12/03/2021   OB History     Gravida  3   Para  1   Term  1   Preterm      AB  1   Living  1      SAB  0   IAB      Ectopic  1   Multiple      Live Births  1        Obstetric Comments  1-breech         Past Medical History:  Diagnosis Date   Anxiety    self reported   Chronic back pain 2021   low back/hip; had nerve ablation in lumbar/sacral joint   Depression    self reported   Hypertension    Maxillary sinus cyst    left   Migraine    Multiple thyroid nodules    3; 1.9 cm and 2 cm, 0.6 cm, will see general  surgeon   No pertinent past medical history  Ovarian cyst    UTI (urinary tract infection)     Past Surgical History:  Procedure Laterality Date   CESAREAN SECTION  2013   laparscopy  2012   ruptured ectopic   SHOULDER CAPSULORRHAPHY Right     Family History  Problem Relation Age of Onset   Hypertension Mother    Diabetes Father    Dementia Father    Cerebral aneurysm Sister    Migraines Sister    Seizures Sister    Heart disease Maternal Grandfather    Heart disease Other        mother's side   High Cholesterol Other        mother's side     Social History   Tobacco Use   Smoking status: Never   Smokeless tobacco: Never  Vaping Use   Vaping Use: Never used  Substance Use Topics   Alcohol use: Not Currently   Drug use: No    Allergies:  Allergies  Allergen Reactions   Tylenol  [Acetaminophen] Anaphylaxis   Aspirin Other (See Comments)    Unknown childhood rxn   Benadryl [Diphenhydramine Hcl] Itching   Buspirone     Other reaction(s): migraines   Citalopram Hydrobromide     Other reaction(s): agitation and dizziness   Dilaudid [Hydromorphone Hcl] Itching    Pill only   Effexor Xr [Venlafaxine]     Other reaction(s): More depressed (11/2017-neurology)   Penicillins     hives   Prednisone     Swelling, hives   Sulfamethoxazole-Trimethoprim     Other reaction(s): swelling (2017)   Zoloft [Sertraline]     Other reaction(s): nausea and swelling   Latex Rash    Medications Prior to Admission  Medication Sig Dispense Refill Last Dose   Prenatal Vit-Fe Fumarate-FA (MULTIVITAMIN-PRENATAL) 27-0.8 MG TABS tablet Take 1 tablet by mouth daily at 12 noon.      Botulinum Toxin Type A (BOTOX) 200 units SOLR Inject 155 Units as directed every 3 (three) months. Inject 155units to head and neck intramuscularly every 3 months. 1 each 3    cyclobenzaprine (FLEXERIL) 10 MG tablet Take 1 tablet (10 mg total) by mouth 3 (three) times daily as needed for muscle spasms. 30 tablet 0    loratadine (CLARITIN) 10 MG tablet Take 10 mg by mouth daily as needed for allergies.      metoCLOPramide (REGLAN) 10 MG tablet Take 1 tablet (10 mg total) by mouth every 8 (eight) hours as needed (headache). 10 tablet 0    ondansetron (ZOFRAN-ODT) 8 MG disintegrating tablet Take 1 tablet (8 mg total) by mouth every 8 (eight) hours as needed for nausea or vomiting. 60 tablet 0    pantoprazole (PROTONIX) 40 MG tablet Take 40 mg by mouth daily.      Prenatal Vit-Fe Fumarate-FA (PRENATAL MULTIVITAMIN) TABS tablet Take 1 tablet by mouth daily at 12 noon.      prochlorperazine (COMPAZINE) 10 MG tablet Take 1 tablet (10 mg total) by mouth every 8 (eight) hours as needed for nausea or vomiting (headache). 10 tablet 0     Review of Systems  Constitutional:  Negative for chills, diaphoresis, fatigue and  fever.  HENT:  Positive for tinnitus.   Eyes:  Positive for visual disturbance.  Respiratory:  Negative for shortness of breath.   Cardiovascular:  Negative for chest pain.  Gastrointestinal:  Positive for nausea. Negative for abdominal pain, constipation, diarrhea and vomiting.  Genitourinary:  Negative for dysuria, flank pain, frequency, pelvic pain,  urgency, vaginal bleeding and vaginal discharge.  Neurological:  Positive for dizziness and headaches. Negative for weakness and light-headedness.   Physical Exam   Blood pressure 134/88, pulse (!) 103, temperature 98.1 F (36.7 C), temperature source Oral, resp. rate 20, height 5\' 3"  (1.6 m), weight 107.5 kg, last menstrual period 03/26/2021, SpO2 98 %, unknown if currently breastfeeding.  Patient Vitals for the past 24 hrs:  BP Temp Temp src Pulse Resp SpO2 Height Weight  11/26/21 1709 -- -- -- -- -- 98 % -- --  11/26/21 1705 -- -- -- -- -- 98 % -- --  11/26/21 1700 134/88 -- -- (!) 103 -- 98 % -- --  11/26/21 1655 -- -- -- -- -- 98 % -- --  11/26/21 1650 -- -- -- -- -- 98 % -- --  11/26/21 1645 128/90 -- -- (!) 103 -- 98 % -- --  11/26/21 1640 -- -- -- -- -- 97 % -- --  11/26/21 1635 -- -- -- -- -- 98 % -- --  11/26/21 1630 (!) 128/94 -- -- (!) 103 -- 98 % -- --  11/26/21 1625 -- -- -- -- -- 98 % -- --  11/26/21 1620 -- -- -- -- -- 98 % -- --  11/26/21 1615 128/86 -- -- (!) 102 -- 98 % -- --  11/26/21 1610 -- -- -- -- -- 98 % -- --  11/26/21 1605 -- -- -- -- -- 98 % -- --  11/26/21 1600 124/87 -- -- (!) 104 -- 97 % -- --  11/26/21 1555 -- -- -- -- -- 98 % -- --  11/26/21 1545 137/84 -- -- (!) 112 -- 97 % -- --  11/26/21 1540 -- -- -- -- -- 96 % -- --  11/26/21 1535 -- -- -- -- -- 97 % -- --  11/26/21 1530 129/88 -- -- (!) 107 -- 96 % -- --  11/26/21 1525 -- -- -- -- -- 97 % -- --  11/26/21 1520 -- -- -- -- -- 97 % -- --  11/26/21 1515 (!) 127/91 -- -- (!) 102 -- 97 % -- --  11/26/21 1510 -- -- -- -- -- 99 % -- --  11/26/21  1505 -- -- -- -- -- 97 % -- --  11/26/21 1500 127/89 -- -- (!) 105 -- 97 % -- --  11/26/21 1455 -- -- -- -- -- 97 % -- --  11/26/21 1450 -- -- -- -- -- 98 % -- --  11/26/21 1445 (!) 144/93 -- -- (!) 101 -- 98 % -- --  11/26/21 1440 -- -- -- -- -- 99 % -- --  11/26/21 1435 -- -- -- -- -- 98 % -- --  11/26/21 1430 108/78 -- -- (!) 109 -- 98 % -- --  11/26/21 1425 -- -- -- -- -- 99 % -- --  11/26/21 1423 131/78 -- -- 95 -- -- -- --  11/26/21 1415 120/73 -- -- 94 -- 98 % -- --  11/26/21 1410 -- -- -- -- -- 100 % -- --  11/26/21 1405 -- -- -- -- -- 100 % -- --  11/26/21 1402 134/85 -- -- 91 -- -- -- --  11/26/21 1400 -- -- -- -- -- 99 % -- --  11/26/21 1355 -- -- -- -- -- 97 % -- --  11/26/21 1350 -- -- -- -- -- 99 % -- --  11/26/21 1345 -- -- -- -- -- 99 % -- --  11/26/21 1340 -- -- -- -- --  97 % -- --  11/26/21 1335 -- -- -- -- -- 97 % -- --  11/26/21 1330 -- -- -- -- -- 98 % -- --  11/26/21 1325 -- -- -- -- -- 97 % -- --  11/26/21 1320 -- -- -- -- -- 98 % -- --  11/26/21 1315 -- -- -- -- -- 97 % -- --  11/26/21 1310 -- -- -- -- -- 98 % -- --  11/26/21 1305 -- -- -- -- -- 98 % -- --  11/26/21 1300 -- -- -- -- -- 98 % -- --  11/26/21 1235 (!) 144/98 -- -- 99 -- -- -- --  11/26/21 1232 (!) 144/98 98.1 F (36.7 C) Oral 96 20 99 % 5\' 3"  (1.6 m) 107.5 kg   Physical Exam Vitals and nursing note reviewed.  Constitutional:      General: She is not in acute distress.    Appearance: Normal appearance. She is not ill-appearing, toxic-appearing or diaphoretic.  HENT:     Head: Normocephalic and atraumatic.  Pulmonary:     Effort: Pulmonary effort is normal.  Neurological:     Mental Status: She is alert and oriented to person, place, and time.  Psychiatric:        Mood and Affect: Mood normal.        Behavior: Behavior normal.        Thought Content: Thought content normal.        Judgment: Judgment normal.   Results for orders placed or performed during the hospital encounter of  11/26/21 (from the past 24 hour(s))  CBC with Differential/Platelet     Status: Abnormal   Collection Time: 11/26/21 12:52 PM  Result Value Ref Range   WBC 11.1 (H) 4.0 - 10.5 K/uL   RBC 5.05 3.87 - 5.11 MIL/uL   Hemoglobin 14.6 12.0 - 15.0 g/dL   HCT 42.8 36.0 - 46.0 %   MCV 84.8 80.0 - 100.0 fL   MCH 28.9 26.0 - 34.0 pg   MCHC 34.1 30.0 - 36.0 g/dL   RDW 13.5 11.5 - 15.5 %   Platelets 252 150 - 400 K/uL   nRBC 0.0 0.0 - 0.2 %   Neutrophils Relative % 62 %   Neutro Abs 6.9 1.7 - 7.7 K/uL   Lymphocytes Relative 28 %   Lymphs Abs 3.1 0.7 - 4.0 K/uL   Monocytes Relative 9 %   Monocytes Absolute 1.0 0.1 - 1.0 K/uL   Eosinophils Relative 1 %   Eosinophils Absolute 0.1 0.0 - 0.5 K/uL   Basophils Relative 0 %   Basophils Absolute 0.0 0.0 - 0.1 K/uL   Immature Granulocytes 0 %   Abs Immature Granulocytes 0.03 0.00 - 0.07 K/uL  Comprehensive metabolic panel     Status: Abnormal   Collection Time: 11/26/21 12:52 PM  Result Value Ref Range   Sodium 134 (L) 135 - 145 mmol/L   Potassium 4.3 3.5 - 5.1 mmol/L   Chloride 101 98 - 111 mmol/L   CO2 22 22 - 32 mmol/L   Glucose, Bld 80 70 - 99 mg/dL   BUN <5 (L) 6 - 20 mg/dL   Creatinine, Ser 0.69 0.44 - 1.00 mg/dL   Calcium 9.6 8.9 - 10.3 mg/dL   Total Protein 6.7 6.5 - 8.1 g/dL   Albumin 2.7 (L) 3.5 - 5.0 g/dL   AST 22 15 - 41 U/L   ALT 14 0 - 44 U/L   Alkaline Phosphatase 317 (H) 38 -  126 U/L   Total Bilirubin 0.7 0.3 - 1.2 mg/dL   GFR, Estimated >60 >60 mL/min   Anion gap 11 5 - 15  Protein / creatinine ratio, urine     Status: Abnormal   Collection Time: 11/26/21  1:06 PM  Result Value Ref Range   Creatinine, Urine 52.78 mg/dL   Total Protein, Urine 33 mg/dL   Protein Creatinine Ratio 0.63 (H) 0.00 - 0.15 mg/mg[Cre]  Type and screen [place]     Status: None (Preliminary result)   Collection Time: 11/26/21  5:40 PM  Result Value Ref Range   ABO/RH(D) PENDING    Antibody Screen PENDING    Sample Expiration       11/29/2021,2359 Performed at [facility], [place] [address], [place], [place] [address]    [place] MFM FETAL BPP WO NON STRESS  Result Date: 11/21/2021 ----------------------------------------------------------------------  OBSTETRICS REPORT                       (Signed Final 11/21/2021 09:02 am) ---------------------------------------------------------------------- Patient Info  ID #:       [id number]                          D.O.B.:  04-Jan-1981 (40 yrs)  Name:       [name]                     Visit Date: 11/21/2021 07:36 am ---------------------------------------------------------------------- Performed By  Attending:        Tama High MD        Ref. Address:     [facility]                                                             [address]                                                             [address] [place], [place]  Performed By:     Jacob Moores BS,       Location:         [facility]                    RDMS, RVT  [facility]                                                             [place] [facility]                                                             [facility]  Referred By:      Christophe Louis MD ---------------------------------------------------------------------- Orders  #  Description                           Code        Ordered By  1  [place] MFM FETAL BPP WO NON               76819.01    YU FANG     STRESS  2  [place] MFM OB FOLLOW UP                   76816.01    YU FANG ----------------------------------------------------------------------  #  Order #                     Accession #                Episode #  1  [id number]                   [id number]                 [id number]  2  [id number]                   [id number]                 [id number] ----------------------------------------------------------------------  Indications  [redacted] weeks gestation of pregnancy                F8B.01  Obesity complicating pregnancy, third          O99.213  trimester (BMI 63)  Pyelectasis of fetus on prenatal ultrasound    O28.3  Advanced maternal age multigravida 36+,        O83.523  third trimester (41 yo)  Previous cesarean delivery, antepartum         O34.219  (Breech)  Medical complication of pregnancy (Covid +     O26.90  in pregnancy)  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 Cord Insertion:      Previously Visualized  Amniotic Fluid  AFI FV:      Within normal limits  AFI Sum(cm)     %Tile       Largest Pocket(cm)  14.7            52          4.7  RUQ(cm)       RLQ(cm)       LUQ(cm)        LLQ(cm)  4.7           3.1           3.2            3.7 ---------------------------------------------------------------------- Biophysical Evaluation  Amniotic F.V:   Pocket => 2 cm             F. Tone:        Observed  F. Movement:    Observed                   Score:          8/8  F. Breathing:   Observed ---------------------------------------------------------------------- Biometry  BPD:      86.7  mm     G. Age:  35w 0d         69  %    CI:        75.92   %    70 - 86                                                          FL/HC:      22.3   %    19.4 - 21.8  HC:      315.4  mm     G. Age:  35w 3d         42  %    HC/AC:      0.94        0.96 - 1.11  AC:      334.8  mm     G. Age:  37w 3d       > 99  %    FL/BPD:     81.1   %    71 - 87  FL:       70.3  mm     G. Age:  36w 0d         84  %    FL/AC:      21.0   %    20 - 24  LV:        3.9  mm  Est. FW:    2970  gm      6 lb 9 oz     96  %  ---------------------------------------------------------------------- OB History  Gravidity:    3         Term:   1        Prem:   0        SAB:   0  TOP:          0       Ectopic:  1        Living: 1 ---------------------------------------------------------------------- Gestational Age  LMP:           34w 2d        Date:  03/26/21  EDD:   12/31/21  U/S Today:     36w 0d                                        EDD:   12/19/21  Best:          34w 2d     Det. By:  LMP  (03/26/21)          EDD:   12/31/21 ---------------------------------------------------------------------- Anatomy  Cranium:               Appears normal         LVOT:                   Not well visualized  Cavum:                 Appears normal         Aortic Arch:            Previously seen  Ventricles:            Appears normal         Ductal Arch:            Not well visualized  Choroid Plexus:        Previously seen        Diaphragm:              Appears normal  Cerebellum:            Previously seen        Stomach:                Appears normal, left                                                                        sided  Posterior Fossa:       Previously seen        Abdomen:                Appears normal  Nuchal Fold:           Previously seen        Abdominal Wall:         Previously seen  Face:                  Orbits nl previously;  Cord Vessels:           Previously seen                         profile not well vis  Lips:                  Previously seen        Kidneys:                Appear normal  Palate:                Not well visualized    Bladder:                Appears normal  Thoracic:  Previously seen        Spine:                  Previously seen  Heart:                 Previously seen        Upper Extremities:      Visualized                                                                        previously  RVOT:                  Not well visualized    Lower Extremities:      Visualized                                                                         previously  Other:  Female gender previously seen. Technically difficult due to maternal          habitus and fetal position. ---------------------------------------------------------------------- Cervix Uterus Adnexa  Cervix  Not visualized (advanced GA >24wks)  Uterus  No abnormality visualized.  Right Ovary  Within normal limits.  Left Ovary  Within normal limits.  Cul De Sac  No free fluid seen.  Adnexa  No abnormality visualized. ---------------------------------------------------------------------- Impression  Patient returned for fetal growth assessment.  She does not  have gestational diabetes.  The estimated fetal weight is at the 96 percentile.  Amniotic  fluid is normal good fetal activity seen.  Antenatal testing is  reassuring.  BPP 8/8.  Blood pressure today at her office is 125/97 mmHg.  Patient  complains of mild headache, nausea and blurry vision.  No  history of vaginal bleeding.  Her blood pressure was then  increased at one of her prenatal visits earlier and the  diagnosis of chronic hypertension was entertained.  I explained the possibility of  preeclampsia and  recommended evaluation at the MAU because of her  symptoms.  Patient agreed to go to the MAU.  I discussed with Dr. Dione Plover. ---------------------------------------------------------------------- Recommendations  - MAU evaluation today.  -Continue weekly BPP till delivery.  -If patient continues to have intermittent hypertension and the  diagnosis of chronic hypertension is unclear, I recommend  delivery at [redacted] weeks gestation. ----------------------------------------------------------------------                  Tama High, MD Electronically Signed Final Report   11/21/2021 09:02 am ----------------------------------------------------------------------  [place] MFM OB FOLLOW UP  Result Date: 11/21/2021 ----------------------------------------------------------------------   OBSTETRICS REPORT                       (Signed Final 11/21/2021 09:02 am) ---------------------------------------------------------------------- Patient Info  ID #:       [id number]                          D.O.B.:  [birth year] (40 yrs)  Name:       Debbie [name]                     Visit Date: 11/21/2021 07:36 am ---------------------------------------------------------------------- Performed By  Attending:        Tama High MD        Ref. Address:     [facility]                                                             [address]                                                             [address] [place], [place]  Performed By:     Jacob Moores BS,       Location:         [facility]                    RDMS, RVT                                [facility]                                                             [facility]                                                             [facility]  Referred By:      Christophe Louis MD ---------------------------------------------------------------------- Orders  #  Description                           Code        Ordered By  1  [place] MFM FETAL BPP WO NON               16010.93    YU  FANG     STRESS  2  [place] MFM OB FOLLOW UP                   [id number]    YU FANG ----------------------------------------------------------------------  #  Order #                     Accession #                Episode #  1  [id number]                   [id number]                 [id number]  2  [id number]                   [id number]                 [id number] ---------------------------------------------------------------------- Indications  [redacted] weeks gestation of pregnancy                T5H.74  Obesity complicating pregnancy, third          O99.213  trimester (BMI 41)  Pyelectasis of fetus on prenatal ultrasound    O28.3  Advanced maternal age multigravida 15+,         O10.523  third trimester (41 yo)  Previous cesarean delivery, antepartum         O34.219  (Breech)  Medical complication of pregnancy (Covid +     O26.90  in pregnancy)  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 Cord Insertion:      Previously Visualized  Amniotic Fluid  AFI FV:      Within normal limits  AFI Sum(cm)     %Tile       Largest Pocket(cm)  14.7            52          4.7  RUQ(cm)       RLQ(cm)       LUQ(cm)        LLQ(cm)  4.7           3.1           3.2            3.7 ---------------------------------------------------------------------- Biophysical Evaluation  Amniotic F.V:   Pocket => 2 cm             F. Tone:        Observed  F. Movement:    Observed                   Score:          8/8  F. Breathing:   Observed ---------------------------------------------------------------------- Biometry  BPD:      86.7  mm     G. Age:  35w 0d         69  %  CI:        75.92   %    70 - 86                                                          FL/HC:      22.3   %    19.4 - 21.8  HC:      315.4  mm     G. Age:  35w 3d         42  %    HC/AC:      0.94        0.96 - 1.11  AC:      334.8  mm     G. Age:  37w 3d       > 99  %    FL/BPD:     81.1   %    71 - 87  FL:       70.3  mm     G. Age:  36w 0d         84  %    FL/AC:      21.0   %    20 - 24  LV:        3.9  mm  Est. FW:    2970  gm      6 lb 9 oz     96  % ---------------------------------------------------------------------- OB History  Gravidity:    3         Term:   1        Prem:   0        SAB:   0  TOP:          0       Ectopic:  1        Living: 1  ---------------------------------------------------------------------- Gestational Age  LMP:           34w 2d        Date:  03/26/21                 EDD:   12/31/21  U/S Today:     36w 0d                                        EDD:   12/19/21  Best:          34w 2d     Det. By:  LMP  (03/26/21)          EDD:   12/31/21 ---------------------------------------------------------------------- Anatomy  Cranium:               Appears normal         LVOT:                   Not well visualized  Cavum:                 Appears normal         Aortic Arch:            Previously seen  Ventricles:            Appears normal  Ductal Arch:            Not well visualized  Choroid Plexus:        Previously seen        Diaphragm:              Appears normal  Cerebellum:            Previously seen        Stomach:                Appears normal, left                                                                        sided  Posterior Fossa:       Previously seen        Abdomen:                Appears normal  Nuchal Fold:           Previously seen        Abdominal Wall:         Previously seen  Face:                  Orbits nl previously;  Cord Vessels:           Previously seen                         profile not well vis  Lips:                  Previously seen        Kidneys:                Appear normal  Palate:                Not well visualized    Bladder:                Appears normal  Thoracic:              Previously seen        Spine:                  Previously seen  Heart:                 Previously seen        Upper Extremities:      Visualized                                                                        previously  RVOT:                  Not well visualized    Lower Extremities:      Visualized  previously  Other:  Female gender previously seen. Technically difficult due to maternal          habitus and fetal position.  ---------------------------------------------------------------------- Cervix Uterus Adnexa  Cervix  Not visualized (advanced GA >24wks)  Uterus  No abnormality visualized.  Right Ovary  Within normal limits.  Left Ovary  Within normal limits.  Cul De Sac  No free fluid seen.  Adnexa  No abnormality visualized. ---------------------------------------------------------------------- Impression  Patient returned for fetal growth assessment.  She does not  have gestational diabetes.  The estimated fetal weight is at the 96 percentile.  Amniotic  fluid is normal good fetal activity seen.  Antenatal testing is  reassuring.  BPP 8/8.  Blood pressure today at her office is 125/97 mmHg.  Patient  complains of mild headache, nausea and blurry vision.  No  history of vaginal bleeding.  Her blood pressure was then  increased at one of her prenatal visits earlier and the  diagnosis of chronic hypertension was entertained.  I explained the possibility of  preeclampsia and  recommended evaluation at the MAU because of her  symptoms.  Patient agreed to go to the MAU.  I discussed with Dr. Dione Plover. ---------------------------------------------------------------------- Recommendations  - MAU evaluation today.  -Continue weekly BPP till delivery.  -If patient continues to have intermittent hypertension and the  diagnosis of chronic hypertension is unclear, I recommend  delivery at [redacted] weeks gestation. ----------------------------------------------------------------------                  Tama High, MD Electronically Signed Final Report   11/21/2021 09:02 am ----------------------------------------------------------------------   MAU Course  Procedures  MDM -preeclampsia evaluation without severe range BP in MAU on admission -symptoms include: HA, blurry vision, seeing spots, nausea, dizziness, ear ringing; 10mg  Reglan IM given -CBC: H/H 14.6/42.8, platelets 252 -CMP: serum creatinine 0.69, AST/ALT 22/14 -PCr: 0.63 -EFM:  reactive       -baseline: 140       -variability: moderate       -accels: present, 15x15       -decels: absent       -TOCO: irritability -after medication administration, pt reports HA from 7/10 to 5/10 and other symptoms have not resolved as usual with administration of Reglan -consulted with Dr. Damita Dunnings who agrees with plan for admission for patient and recommends delivery -called Dr. Delora Fuel to recommend admission and per Dr. Delora Fuel, will admit to [facility]. Dr. Delora Fuel advised that NICU is yellow status and needs a phone call prior to admission. -pt and husband notified and agree with plan -admit to [facility]  Orders Placed This Encounter  Procedures   Resp Panel by RT-PCR (Flu A&B, Covid) Nasopharyngeal Swab    Standing Status:   Standing    Number of Occurrences:   1   CBC with Differential/Platelet    Standing Status:   Standing    Number of Occurrences:   1   Comprehensive metabolic panel    Standing Status:   Standing    Number of Occurrences:   1   Protein / creatinine ratio, urine    Standing Status:   Standing    Number of Occurrences:   1   Diet regular Room service appropriate? Yes; Fluid consistency: Thin; Fluid restriction: Other (see comments)    Standing Status:   Standing    Number of Occurrences:   1    Order Specific Question:   Room service appropriate?    Answer:   Yes  Order Specific Question:   Fluid consistency:    Answer:   Thin    Order Specific Question:   Fluid restriction:    Answer:   Other (see comments)   Notify physician (specify)    Standing Status:   Standing    Number of Occurrences:   20    Order Specific Question:   Notify Physician    Answer:   for pulse less than 60 or greater than 120    Order Specific Question:   Notify Physician    Answer:   for respiratory rate less than 12 or greater than 28    Order Specific Question:   Notify Physician    Answer:   for temperature greater than 100.4    Order Specific Question:   Notify Physician     Answer:   for urinary output less than 30 ml/hr    Order Specific Question:   Notify Physician    Answer:   for systolic BP less than 80 or greater than 140    Order Specific Question:   Notify Physician    Answer:   for diastolic BP less than 40 or greater than 90   Vital signs    While awake, respect sleep.    Standing Status:   Standing    Number of Occurrences:   1   Defer vaginal exam for vaginal bleeding or PROM <37 weeks    Standing Status:   Standing    Number of Occurrences:   1   Initiate Oral Care Protocol    Standing Status:   Standing    Number of Occurrences:   1   Initiate Carrier Fluid Protocol    Standing Status:   Standing    Number of Occurrences:   1   SCDs    Standing Status:   Standing    Number of Occurrences:   1    Order Specific Question:   Laterality    Answer:   Bilateral   Fetal monitoring    Standing Status:   Standing    Number of Occurrences:   1   Activity as tolerated    Standing Status:   Standing    Number of Occurrences:   1   Full code    Standing Status:   Standing    Number of Occurrences:   1   Type and screen [place]     Standing Status:   Standing    Number of Occurrences:   1   Meds ordered this encounter  Medications   metoCLOPramide (REGLAN) injection 10 mg   zolpidem (AMBIEN) tablet 5 mg   docusate sodium (COLACE) capsule 100 mg   calcium carbonate (TUMS - dosed in mg elemental calcium) chewable tablet 400 mg of elemental calcium   prenatal multivitamin tablet 1 tablet   lactated ringers infusion   Assessment and Plan   1. Severe pre-eclampsia in third trimester   2. Migraine with aura and without status migrainosus, not intractable   3. [redacted] weeks gestation of pregnancy   4. NST (non-stress test) reactive    -intractable migraine vs. severe preeclampsia -admit to [facility] specialty care  Gerrie Nordmann Ayomide Zuleta 11/26/2021, 5:58 PM

## 2021-11-27 ENCOUNTER — Ambulatory Visit: Payer: BC Managed Care – PPO | Admitting: Family Medicine

## 2021-11-27 ENCOUNTER — Encounter (HOSPITAL_COMMUNITY): Payer: Self-pay | Admitting: Obstetrics and Gynecology

## 2021-11-27 ENCOUNTER — Other Ambulatory Visit: Payer: Self-pay

## 2021-11-27 DIAGNOSIS — O1413 Severe pre-eclampsia, third trimester: Secondary | ICD-10-CM | POA: Diagnosis present

## 2021-11-27 LAB — COMPREHENSIVE METABOLIC PANEL
ALT: 15 U/L (ref 0–44)
AST: 25 U/L (ref 15–41)
Albumin: 2.3 g/dL — ABNORMAL LOW (ref 3.5–5.0)
Alkaline Phosphatase: 290 U/L — ABNORMAL HIGH (ref 38–126)
Anion gap: 10 (ref 5–15)
BUN: <5 mg/dL — ABNORMAL LOW (ref 6–20)
CO2: 20 mmol/L — ABNORMAL LOW (ref 22–32)
Calcium: 8.2 mg/dL — ABNORMAL LOW (ref 8.9–10.3)
Chloride: 99 mmol/L (ref 98–111)
Creatinine, Ser: 0.72 mg/dL (ref 0.44–1.00)
GFR, Estimated: >60 mL/min (ref >60)
Glucose, Bld: 142 mg/dL — ABNORMAL HIGH (ref 70–99)
Potassium: 4.3 mmol/L (ref 3.5–5.1)
Sodium: 129 mmol/L — ABNORMAL LOW (ref 135–145)
Total Bilirubin: 0.8 mg/dL (ref 0.3–1.2)
Total Protein: 5.9 g/dL — ABNORMAL LOW (ref 6.5–8.1)

## 2021-11-27 LAB — CBC
HCT: 46 % (ref 36.0–46.0)
HCT: 39.4 % (ref 36.0–46.0)
Hemoglobin: 15.4 g/dL — ABNORMAL HIGH (ref 12.0–15.0)
Hemoglobin: 13.1 g/dL (ref 12.0–15.0)
MCH: 29 pg (ref 26.0–34.0)
MCH: 28.4 pg (ref 26.0–34.0)
MCHC: 33.5 g/dL (ref 30.0–36.0)
MCHC: 33.2 g/dL (ref 30.0–36.0)
MCV: 86.6 fL (ref 80.0–100.0)
MCV: 85.5 fL (ref 80.0–100.0)
Platelets: 236 10*3/uL (ref 150–400)
Platelets: 259 10*3/uL (ref 150–400)
RBC: 5.31 MIL/uL — ABNORMAL HIGH (ref 3.87–5.11)
RBC: 4.61 MIL/uL (ref 3.87–5.11)
RDW: 13.5 % (ref 11.5–15.5)
RDW: 13.2 % (ref 11.5–15.5)
WBC: 13.3 10*3/uL — ABNORMAL HIGH (ref 4.0–10.5)
WBC: 15.9 10*3/uL — ABNORMAL HIGH (ref 4.0–10.5)
nRBC: 0 % (ref 0.0–0.2)
nRBC: 0 % (ref 0.0–0.2)

## 2021-11-27 LAB — CREATININE, SERUM
Creatinine, Ser: 0.6 mg/dL (ref 0.44–1.00)
GFR, Estimated: >60 mL/min (ref >60)

## 2021-11-27 LAB — MAGNESIUM: Magnesium: 4.4 mg/dL — ABNORMAL HIGH (ref 1.7–2.4)

## 2021-11-27 MED ORDER — SIMETHICONE 80 MG PO CHEW
80.0000 mg | CHEWABLE_TABLET | Freq: Three times a day (TID) | ORAL | Status: DC
  Administered 2021-11-27 – 2021-11-29 (×7): 80 mg via ORAL
  Filled 2021-11-27 (×7): qty 1

## 2021-11-27 MED ORDER — OXYTOCIN-SODIUM CHLORIDE 30-0.9 UT/500ML-% IV SOLN: 2.5000 [IU]/h | INTRAVENOUS | Status: AC

## 2021-11-27 MED ORDER — CYCLOBENZAPRINE HCL 10 MG PO TABS: 10.0000 mg | ORAL_TABLET | Freq: Three times a day (TID) | ORAL | Status: DC | PRN

## 2021-11-27 MED ORDER — ZOLPIDEM TARTRATE 5 MG PO TABS: 5.0000 mg | ORAL_TABLET | Freq: Every evening | ORAL | Status: DC | PRN

## 2021-11-27 MED ORDER — LORATADINE 10 MG PO TABS: 10.0000 mg | ORAL_TABLET | Freq: Every day | ORAL | Status: DC | PRN

## 2021-11-27 MED ORDER — MORPHINE SULFATE (PF) 2 MG/ML IV SOLN: 1.0000 mg | INTRAVENOUS | Status: DC | PRN

## 2021-11-27 MED ORDER — MENTHOL 3 MG MT LOZG: 1.0000 | LOZENGE | OROMUCOSAL | Status: DC | PRN

## 2021-11-27 MED ORDER — DIBUCAINE (PERIANAL) 1 % EX OINT: 1.0000 "application " | TOPICAL_OINTMENT | CUTANEOUS | Status: DC | PRN

## 2021-11-27 MED ORDER — OXYCODONE HCL 5 MG PO TABS
5.0000 mg | ORAL_TABLET | ORAL | Status: DC | PRN
  Administered 2021-11-28: 5 mg via ORAL
  Administered 2021-11-29: 10 mg via ORAL
  Administered 2021-11-29: 5 mg via ORAL
  Filled 2021-11-27: qty 1
  Filled 2021-11-27: qty 2
  Filled 2021-11-27: qty 1

## 2021-11-27 MED ORDER — SENNOSIDES-DOCUSATE SODIUM 8.6-50 MG PO TABS
2.0000 | ORAL_TABLET | Freq: Every day | ORAL | Status: DC
  Administered 2021-11-28 – 2021-11-29 (×2): 2 via ORAL
  Filled 2021-11-27 (×2): qty 2

## 2021-11-27 MED ORDER — METOCLOPRAMIDE HCL 10 MG PO TABS
10.0000 mg | ORAL_TABLET | Freq: Three times a day (TID) | ORAL | Status: DC | PRN
  Administered 2021-11-27 – 2021-11-28 (×2): 10 mg via ORAL
  Filled 2021-11-27 (×2): qty 1

## 2021-11-27 MED ORDER — PRENATAL MULTIVITAMIN CH
1.0000 | ORAL_TABLET | Freq: Every day | ORAL | Status: DC
  Administered 2021-11-27 – 2021-11-28 (×2): 1 via ORAL
  Filled 2021-11-27 (×2): qty 1

## 2021-11-27 MED ORDER — GABAPENTIN 100 MG PO CAPS
100.0000 mg | ORAL_CAPSULE | Freq: Three times a day (TID) | ORAL | Status: DC
  Administered 2021-11-27 – 2021-11-29 (×7): 100 mg via ORAL
  Filled 2021-11-27 (×7): qty 1

## 2021-11-27 MED ORDER — WITCH HAZEL-GLYCERIN EX PADS: 1.0000 "application " | MEDICATED_PAD | CUTANEOUS | Status: DC | PRN

## 2021-11-27 MED ORDER — PANTOPRAZOLE SODIUM 40 MG PO TBEC
40.0000 mg | DELAYED_RELEASE_TABLET | Freq: Every day | ORAL | Status: DC
  Administered 2021-11-27 – 2021-11-29 (×3): 40 mg via ORAL
  Filled 2021-11-27 (×3): qty 1

## 2021-11-27 MED ORDER — ENOXAPARIN SODIUM 60 MG/0.6ML IJ SOSY
60.0000 mg | PREFILLED_SYRINGE | INTRAMUSCULAR | Status: DC
  Administered 2021-11-27 – 2021-11-28 (×2): 60 mg via SUBCUTANEOUS
  Filled 2021-11-27 (×2): qty 0.6

## 2021-11-27 MED ORDER — SIMETHICONE 80 MG PO CHEW: 80.0000 mg | CHEWABLE_TABLET | ORAL | Status: DC | PRN

## 2021-11-27 MED ORDER — LACTATED RINGERS IV SOLN: INTRAVENOUS | Status: DC

## 2021-11-27 MED ORDER — COCONUT OIL OIL
1.0000 "application " | TOPICAL_OIL | Status: DC | PRN
  Administered 2021-11-27: 1 via TOPICAL

## 2021-11-27 NOTE — Transfer of Care (Signed)
Immediate Anesthesia Transfer of Care Note  Patient: Debbie [name]  Procedure(s) Performed: REPEAT CESAREAN SECTION  Patient Location: PACU  Anesthesia Type:Spinal  Level of Consciousness: awake, alert  and oriented  Airway & Oxygen Therapy: Patient Spontanous Breathing  Post-op Assessment: Report given to RN and Post -op Vital signs reviewed and stable  Post vital signs: Reviewed and stable  Last Vitals:  Vitals Value Taken Time  BP 140/90 11/27/21 0339  Temp 36.7 C 11/27/21 0339  Pulse 85 11/27/21 0339  Resp 18 11/27/21 0620  SpO2 98 % 11/27/21 0539  Vitals shown include unvalidated device data.  Last Pain:  Vitals:   11/27/21 0620  TempSrc:   PainSc: 0-No pain      Patients Stated Pain Goal: 4 (16/10/96 0454)  Complications: No notable events documented.

## 2021-11-27 NOTE — Anesthesia Postprocedure Evaluation (Signed)
Anesthesia Post Note  Patient: Debbie [name]  Procedure(s) Performed: [place]     Patient location during evaluation: Mother Baby Anesthesia Type: Spinal Level of consciousness: oriented and awake and alert Pain management: pain level controlled Vital Signs Assessment: post-procedure vital signs reviewed and stable Respiratory status: spontaneous breathing and respiratory function stable Cardiovascular status: blood pressure returned to baseline and stable Postop Assessment: no backache, no apparent nausea or vomiting, able to ambulate and spinal receding Anesthetic complications: no Comments: Baseline headache still present   No notable events documented.  Last Vitals:  Vitals:   11/27/21 0045 11/27/21 0100  BP: 100/71 107/79  Pulse: (!) 102 91  Resp: 16 18  Temp:    SpO2: 98% 100%    Last Pain:  Vitals:   11/27/21 0100  TempSrc:   PainSc: 0-No pain   Pain Goal: Patients Stated Pain Goal: 4 (11/26/21 1855)  LLE Motor Response: Purposeful movement (11/27/21 0100) LLE Sensation: Tingling (11/27/21 0100) RLE Motor Response: Purposeful movement (11/27/21 0100) RLE Sensation: Tingling (11/27/21 0100)     Epidural/Spinal Function Cutaneous sensation: Tingles (11/27/21 0045), Patient able to flex knees: No (11/27/21 0045), Patient able to lift hips off bed: No (11/27/21 0045), Back pain beyond tenderness at insertion site: No (11/27/21 0045), Progressively worsening motor and/or sensory loss: No (11/27/21 0045), Bowel and/or bladder incontinence post epidural: No (11/27/21 0045)  Merlinda Frederick

## 2021-11-27 NOTE — Lactation Note (Signed)
This note was copied from a baby's chart. Lactation Consultation Note  Patient Name: Debbie [name] [id number] Date: 11/27/2021 Reason for consult: Follow-up assessment;NICU baby;Late-preterm 34-36.6wks;Other (Comment) (AMA) Age:40 hours  Visited with mom of 11 hours LPI NICU female, she's a P2 and reported (+) breast changes during the pregnancy. Mom is also experienced BF, she breastfed her first child, he's now 40 y.o.  Per mom pumping is going well, she's already getting some colostrum, praised her for her efforts. FOB will be picking up the breastmilk labels the next time he goes to the NICU.   Mom eligible for a Stork pump but she politely declined, she likes the wearable one she got out of pocket. Reviewed pumping schedule, pumping log, lactogenesis II and supply/demand.  Maternal Data Has patient been taught Hand Expression?: Yes Does the patient have breastfeeding experience prior to this delivery?: Yes How long did the patient breastfeed?: 17 months  Feeding Mother's Current Feeding Choice: Breast Milk and Donor Milk  Lactation Tools Discussed/Used Tools: Pump;Flanges;Coconut oil Flange Size: 27 Breast pump type: Double-Electric Breast Pump Pump Education: Setup, frequency, and cleaning;Milk Storage Reason for Pumping: LPI in NICU Pumping frequency: q 3 hours (recommended) Pumped volume: 6 mL  Interventions Interventions: Breast feeding basics reviewed;DEBP;Education;"The NICU and Your Baby" book;LC Services brochure;Coconut oil  Plan of care  Encouraged mom to start pumping consistently every 3 hours, at least 8 pumping sessions/24 hours Hand expression, breast massage and coconut oil were also encouraged prior pumping  FOB present and very supportive. All questions and concerns answered, parents to call NICU LC PRN.  Discharge Pump: DEBP;Personal (Mom Cozy)  Consult Status Consult Status: Follow-up Date: 11/27/21 Follow-up type: In-patient   Debbie [name] Francene Boyers 11/27/2021, 10:52 AM

## 2021-11-27 NOTE — Progress Notes (Signed)
Subjective: Postpartum Day 1: Cesarean Delivery Patient reports incisional pain and tolerating PO.   Patient reports headache has improved but still present 2 out of 10 . She reports feeling much better than yesterday. Pain is well controlled.   Objective: Vital signs in last 24 hours: Temp:  [97.8 F (36.6 C)-98.9 F (37.2 C)] 98.2 F (36.8 C) (01/04 0803) Pulse Rate:  [82-112] 96 (01/04 0803) Resp:  [12-24] 18 (01/04 0803) BP: (100-144)/(61-98) 130/83 (01/04 0803) SpO2:  [95 %-100 %] 100 % (01/04 0803) Weight:  [107.5 kg] 107.5 kg (01/03 1232)  Physical Exam:  General: alert, cooperative, and no distress Lochia: appropriate Uterine Fundus: firm Incision: bandage clean dry and intact  DVT Evaluation: No evidence of DVT seen on physical exam.  Recent Labs    11/26/21 1753 11/27/21 0734  HGB 15.4* 13.1  HCT 46.0 39.4    Assessment/Plan: Status post Cesarean section. Doing well postoperatively. Preeeclampsia with severe features. Will continue magnesium sulfate for 24 hours post delivery. Cbc and cmp pending for this morning.    - pain controll - gabapentin and oxycodone prn  - Once magnesium is discontinued encourage ambulation.  Will continue to monitor closely .  Debbie [name] 11/27/2021, 8:16 AM

## 2021-11-27 NOTE — Plan of Care (Signed)
°  Problem: Education: Goal: Knowledge of the prescribed therapeutic regimen will improve Outcome: Progressing   Problem: Clinical Measurements: Goal: Complications related to the disease process, condition or treatment will be avoided or minimized Outcome: Progressing   Problem: Education: Goal: Knowledge of disease or condition will improve Outcome: Progressing   Problem: Clinical Measurements: Goal: Ability to maintain clinical measurements within normal limits will improve Outcome: Progressing

## 2021-11-28 ENCOUNTER — Ambulatory Visit: Payer: BC Managed Care – PPO

## 2021-11-28 LAB — URINALYSIS, ROUTINE W REFLEX MICROSCOPIC
Bilirubin Urine: NEGATIVE
Glucose, UA: NEGATIVE mg/dL
Ketones, ur: NEGATIVE mg/dL
Leukocytes,Ua: NEGATIVE
Nitrite: NEGATIVE
Protein, ur: NEGATIVE mg/dL
RBC / HPF: >50 RBC/hpf — ABNORMAL HIGH (ref 0–5)
Specific Gravity, Urine: 1.003 — ABNORMAL LOW (ref 1.005–1.030)
pH: 6 (ref 5.0–8.0)

## 2021-11-28 LAB — SURGICAL PATHOLOGY

## 2021-11-28 MED ORDER — METOCLOPRAMIDE HCL 10 MG PO TABS: 10.0000 mg | ORAL_TABLET | Freq: Four times a day (QID) | ORAL | Status: DC | PRN

## 2021-11-28 MED ORDER — OXYCODONE HCL 5 MG PO TABS: 5.0000 mg | ORAL_TABLET | ORAL | 0 refills | Status: DC | PRN

## 2021-11-28 MED ORDER — GABAPENTIN 100 MG PO CAPS: 100.0000 mg | ORAL_CAPSULE | Freq: Three times a day (TID) | ORAL | 1 refills | Status: DC

## 2021-11-28 NOTE — Progress Notes (Signed)
Subjective: Postpartum Day 2: Cesarean Delivery Patient reports incisional pain, tolerating PO, and no problems voiding.    She reports mild headache she has not had any reglan for headache today   Objective: Vital signs in last 24 hours: Temp:  [97.9 F (36.6 C)-98.7 F (37.1 C)] 97.9 F (36.6 C) (01/05 1525) Pulse Rate:  [86-117] 117 (01/05 1525) Resp:  [16-18] 18 (01/05 1525) BP: (118-135)/(72-86) 131/86 (01/05 1525) SpO2:  [97 %-100 %] 100 % (01/05 1525)  Physical Exam:  General: alert, cooperative, and no distress Lochia: appropriate Uterine Fundus: firm Incision: healing well DVT Evaluation: No evidence of DVT seen on physical exam.  Recent Labs    11/26/21 1753 11/27/21 0734  HGB 15.4* 13.1  HCT 46.0 39.4    Assessment/Plan: Status post Cesarean section. Doing well postoperatively.  Headaches - reglan ordered.  Preeclampsia with severe features - bp currently normal will continue to monitor Encourage ambulation  Plan discharge home tomorrow .  Christophe Louis 11/28/2021, 5:46 PM

## 2021-11-28 NOTE — Progress Notes (Signed)
Called for vascular US per order from Dr. Landry Mellow, was given multiple numbers. Clarified with [place] and was informed that the department is after hours and will reopen 11/29/21 at [facility] and left a message.

## 2021-11-28 NOTE — Lactation Note (Signed)
This note was copied from a baby's chart. Lactation Consultation Note  Patient Name: Debbie [name] [id number] Date: 11/28/2021 Reason for consult: Follow-up assessment;NICU baby;Late-preterm 34-36.6wks Age:40 hours  Lactation followed up with [name] and assisted with pumping. I provided a diy pumping "bra" for her to use, and I educated at the bedside about what to expect with pumping and her milk volume. [name] is using Reglan; she does has a history of anxiety and depression.   I encouraged [name] to continue to pump q3 hours as able, and to provide oral care with expressed colostrum, as able, in the NICU.   Maternal Data Does the patient have breastfeeding experience prior to this delivery?: Yes How long did the patient breastfeed?: 17 months  Feeding Mother's Current Feeding Choice: Breast Milk  Lactation Tools Discussed/Used Tools: Pump;Hands-free pumping top Flange Size: 24;Other (comment) (she prefers the 24) Breast pump type: Double-Electric Breast Pump Pump Education: Setup, frequency, and cleaning Reason for Pumping: NICU Pumping frequency: recommended q3 hours Pumped volume: 0 mL  Interventions Interventions: Breast feeding basics reviewed;Education;DEBP  Discharge Pump: DEBP;Personal (wearable pump at home)  Consult Status Consult Status: Follow-up Date: 11/28/21 Follow-up type: In-patient    Lenore Manner 11/28/2021, 12:14 PM

## 2021-11-28 NOTE — Clinical Social Work Maternal (Signed)
°CLINICAL SOCIAL WORK MATERNAL/CHILD NOTE ° °Patient Details  °Name: Debbie [name] °MRN: [id number] °Date of Birth: 04/18/1981 ° °Date:  11/28/2021 ° °Clinical Social Worker Initiating Note:  Debbie Desilva, LCSW Date/Time: Initiated:  11/28/21/1244    ° °Child's Name:  Debbie [name]  ° °Biological Parents:  Mother, Father (Father: Debbie [name])  ° °Need for Interpreter:  None  ° °Reason for Referral:  Behavioral Health Concerns, Other (Comment) (Infant's NICU admission)  ° °Address:  [address] °[place] [address]  °  °Phone number:  [phone] (home)    ° °Additional phone number:  ° °Household Members/Support Persons (HM/SP):   Household Member/Support Person 1, Household Member/Support Person 2 ° ° °HM/SP Name Relationship DOB or Age  °HM/SP -1 Debbie [name] FOB    °HM/SP -2 Debbie [name] son 09/11/12  °HM/SP -3        °HM/SP -4        °HM/SP -5        °HM/SP -6        °HM/SP -7        °HM/SP -8        ° ° °Natural Supports (not living in the home):  Friends, Immediate Family  ° °Professional Supports: None  ° °Employment: Full-time  ° °Type of Work: Applications Support Specialist  ° °Education:  Graduate degree  ° °Homebound arranged:   ° °Financial Resources:  Private Insurance   ° °Other Resources:     ° °Cultural/Religious Considerations Which May Impact Care:   ° °Strengths:  Ability to meet basic needs  , Pediatrician chosen, Home prepared for child  , Understanding of illness  ° °Psychotropic Medications:        ° °Pediatrician:    [place] area ° °Pediatrician List:  ° °[facility] [place] (Peds)  °[place]    °[place]    °[place]    °[place]    °[place]    ° ° °Pediatrician Fax Number:   ° °Risk Factors/Current Problems:  Mental Health Concerns    ° °Cognitive State:  Alert  , Able to Concentrate  , Linear Thinking  , Insightful  , Goal Oriented    ° °Mood/Affect:  Calm  , Interested  , Comfortable    ° °CSW Assessment: CSW met  with MOB at bedside to complete psychosocial assessment. CSW introduced self and explained role. MOB was welcoming, open, pleasant, and remained engaged during assessment. MOB reported that she resides with FOB and older son. 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 MOB reported that they have all items needed to care for infant including a car seat and basinet. CSW inquired about MOB's support system, MOB reported that she doesn't have any local supports but she has friends coming into town and her sister plans to come into town Saturday.  ° °CSW inquired about MOB's mental health history. MOB denied any mental health history. MOB reported that she has taken anxiety and depression medications in the past to treat other diagnosis. MOB reported that she has never been diagnosed with depression or anxiety. MOB reported that she may have anxiety but she manages well and does not have any current symptoms. MOB described her anxiety as over thinking. MOB denied any history of postpartum depression. CSW inquired about how MOB was feeling emotionally since giving birth, MOB shared about feelings of guilt surrounding infant being delivered early. CSW acknowledged, validated   and normalized MOB's feelings surrounding her experience. CSW reminded MOB that she did nothing wrong and encouraged MOB to be intentional about challenging the guilty thoughts as they can be intrusive. MOB verbalized understanding. MOB presented calm and did not demonstrate any acute mental health signs/symptoms. CSW assessed for safety, MOB denied SI, HI, and domestic violence.  ° °CSW provided education regarding the baby blues period vs. perinatal mood disorders, discussed treatment and gave resources for mental health follow up if concerns arise.  CSW recommends self-evaluation during the postpartum time period using the New Mom Checklist from Postpartum Progress and encouraged MOB to contact  a medical professional if symptoms are noted at any time.   ° °CSW provided review of Sudden Infant Death Syndrome (SIDS) precautions.   ° °CSW and MOB discussed infant's NICU admission. CSW informed MOB about the NICU, what to expect, and supports available while infant is admitted to the NICU. MOB reported that infant's NICU admission has been good and she feels well informed about infant's care. MOB denied any transportation barriers with visiting infant in the NICU. MOB denied any questions/concerns regarding the NICU.  ° °CSW will continue to offer resources/supports while infant is admitted to the NICU.  ° ° °CSW Plan/Description:  Sudden Infant Death Syndrome (SIDS) Education, Perinatal Mood and Anxiety Disorder (PMADs) Education, Other Patient/Family Education  ° ° °Debbie Brame L Xoe Hoe, LCSW °11/28/2021, 12:46 PM °

## 2021-11-29 ENCOUNTER — Encounter: Payer: Self-pay | Admitting: Family Medicine

## 2021-11-29 ENCOUNTER — Inpatient Hospital Stay (HOSPITAL_COMMUNITY): Payer: BC Managed Care – PPO

## 2021-11-29 DIAGNOSIS — M25561 Pain in right knee: Secondary | ICD-10-CM

## 2021-11-29 NOTE — Progress Notes (Signed)
Lower extremity venous RT study completed.  Preliminary results relayed to [facility], DO.  See CV Proc for preliminary results report.   Darlin Coco, RDMS, RVT

## 2021-11-29 NOTE — Lactation Note (Signed)
This note was copied from a baby's chart.  NICU Lactation Consultation Note  Patient Name: Debbie [name] Date: 11/29/2021 Age:40 hours   Subjective Reason for consult: Follow-up assessment; Maternal discharge Mother continues to pump frequently. She denies discomfort or difficulty using pump. Mother is experiencing + breast changes today.  Mother is aware of [place] services in NICU.  We reviewed feeding readiness and importance of pumping until that time.   Objective Infant data: Mother's Current Feeding Choice: Breast Milk and Donor Milk  Infant feeding assessment Scale for Readiness: 3   Maternal data: [id number]  C-Section, Low Transverse Current breast feeding challenges:: NICU - separation Does the patient have breastfeeding experience prior to this delivery?: Yes How long did the patient breastfeed?: 17 months  Pumping frequency: q3 Pumped volume: 20 mL Flange Size: 24; Other (comment) (she prefers the 24)  Pump: DEBP, Personal (wearable pump at home)  Assessment Infant: Feeding Status: NPO   Maternal: Milk volume: Normal   Intervention/Plan Interventions: Education; Infant Driven Feeding Algorithm education  Tools: Pump; Hands-free pumping top Pump Education: Setup, frequency, and cleaning  Plan: Consult Status: Follow-up  NICU Follow-up type: Verify absence of engorgement; Verify onset of copious milk  Mother to continue pumping q3 and bringing milk to NICU.  LC to assist with bf'ing when baby is ready.  [name] 11/29/2021, 10:30 AM

## 2021-11-29 NOTE — Progress Notes (Signed)
Postpartum Note Day #3  S:  Patient doing well.  Pain controlled.  Tolerating regular diet.   Ambulating and voiding without difficulty. Last night, reported some dysuria. Also, reported sacral pain that radiated down right thigh. Reported a warmth superior and lateral to the right knee but this morning it resolved after her sacrum readjusted. Curious if the sacral pain was related. Previously used Gabapentin prior to pregnancy and took 300mg  TID. States current dose inpatient is helping but desires to resume 300mg  TID dosing which she has at home. Otherwise, pain is well-controlled. Has not required Reglan for headache. Has minimal baseline headache. Denies fevers, chills, chest pain, SOB, N/V, or worsening bilateral LE edema.  Lochia: Minimal Infant feeding:  Pumping Circumcision:  Infant in NICU Contraception:  S/p salpingectomy at time of CS  O: Temp:  [97.9 F (36.6 C)-99.3 F (37.4 C)] 99 F (37.2 C) (01/06 0400) Pulse Rate:  [95-117] 95 (01/06 0400) Resp:  [16-18] 16 (01/06 0400) BP: (102-134)/(55-86) 131/62 (01/06 0400) SpO2:  [95 %-100 %] 98 % (01/06 0400) Gen: NAD, pleasant and cooperative CV: RRR Resp: CTAB, no wheezes/rales/rhonchi Abdomen: soft, non-distended, non-tender throughout Uterus: firm, non-tender, below umbilicus Incision: c/d/i, bandage in place  Ext: Trace bilateral LE edema, no bilateral calf tenderness  Labs:  Recent Labs    11/26/21 1753 11/27/21 0734  HGB 15.4* 13.1    A/P: Patient is a 41 y.o. [id number] POD#3 s/p repeat LTCS, left salpingectomy, and partial right salpingectomy.  S/p LTCS - Pain well controlled  - GU: UOP is adequate - GI: Tolerating regular diet - Activity: encouraged sitting up to chair and ambulation as tolerated - DVT Prophylaxis: SCDs, Lovenox, ambulation - Labs: as above  Preeclampsia with SF - Based on headache, mild range Bps, and proteinuria prior to delivery - Normotensive since delivery - S/p Mag sulfate x 24h  postpartum - Medications: None - Will check BP 1 week postpartum  Warmth of right knee - Resolved this morning - STAT RLE dopplers ordered, if unremarkable - discharge home today  Dysuria - Urinalysis with rare bacteria - Urine culture pending  Disposition:  D/C home today  Drema Dallas, DO

## 2021-11-29 NOTE — Discharge Summary (Signed)
Postpartum Discharge Summary  Date of Service: 11/29/21      Patient Name: Debbie [name] DOB: Apr 14, 1981 MRN: [id number]  Date of admission: 11/26/2021 Delivery date:11/26/2021  Delivering provider: Wyatt Mage  Date of discharge: 11/29/2021  Admitting diagnosis: Severe preeclampsia, third trimester [O14.13] Intrauterine pregnancy: [redacted]w[redacted]d    Secondary diagnosis:  Principal Problem:   Severe preeclampsia, third trimester  Additional problems: Advanced maternal age, History of cesarean section x 1, seizure disorder, anxiety/depression, history of ectopic pregnancy (s/p right salpingectomy in 2012), chronic headaches    Discharge diagnosis: Preterm Pregnancy Delivered and Preeclampsia (severe)                                              Post partum procedures: None Augmentation: N/A Complications: None  Hospital course: Sceduled C/S   41y.o. yo [id number] admitted to the hospital 11/26/2021 for scheduled cesarean section with the following indication:Elective Repeat and Preeclampsia with severe features (mild range Bps and severe headache unresolved with medication) .Delivery details are as follows:  Membrane Rupture Time/Date:  ,   Delivery Method:C-Section, Low Transverse  Details of operation can be found in separate operative note.  Patient had an uncomplicated postpartum course. She reported warmth of right knee - RLE doppler was performed and found to be negative. She is ambulating, tolerating a regular diet, passing flatus, and urinating well. Patient is discharged home in stable condition on  11/29/21        Newborn Data: Birth date:11/26/2021  Birth time:11:02 PM  Gender:Female  Living status:Living  Apgars:7 ,9  Weight:2960 g     Magnesium Sulfate received: Yes: Seizure prophylaxis BMZ received: No (due to anaphylactic allergy to Prednisone) Rhophylac:N/A MMR:N/A T-DaP:Given prenatally Transfusion:No  Physical exam  Vitals:   11/28/21 1948 11/28/21 2128 11/28/21  2357 11/29/21 0400  BP: 134/84  (!) 102/55 131/62  Pulse: (!) 108  (!) 114 95  Resp: '18  18 16  ' Temp: 98.8 F (37.1 C) 98.5 F (36.9 C) 99.3 F (37.4 C) 99 F (37.2 C)  TempSrc: Oral Oral Oral Oral  SpO2: 100%  95% 98%  Weight:      Height:       General: alert, cooperative, and no distress Lochia: appropriate Uterine Fundus: firm Incision: Dressing is clean, dry, and intact DVT Evaluation: No evidence of DVT seen on physical exam. No cords or calf tenderness. Labs: Lab Results  Component Value Date   WBC 15.9 (H) 11/27/2021   HGB 13.1 11/27/2021   HCT 39.4 11/27/2021   MCV 85.5 11/27/2021   PLT 259 11/27/2021   CMP Latest Ref Rng & Units 11/27/2021  Glucose 70 - 99 mg/dL 142(H)  BUN 6 - 20 mg/dL <5(L)  Creatinine 0.44 - 1.00 mg/dL 0.72  Sodium 135 - 145 mmol/L 129(L)  Potassium 3.5 - 5.1 mmol/L 4.3  Chloride 98 - 111 mmol/L 99  CO2 22 - 32 mmol/L 20(L)  Calcium 8.9 - 10.3 mg/dL 8.2(L)  Total Protein 6.5 - 8.1 g/dL 5.9(L)  Total Bilirubin 0.3 - 1.2 mg/dL 0.8  Alkaline Phos 38 - 126 U/L 290(H)  AST 15 - 41 U/L 25  ALT 0 - 44 U/L 15   Edinburgh Score: No flowsheet data found.    After visit meds:  Allergies as of 11/29/2021       Reactions   Tylenol [acetaminophen] Anaphylaxis  Aspirin Hives   Unknown childhood rxn   Benadryl [diphenhydramine Hcl] Itching   Buspirone    Other reaction(s): migraines   Citalopram Hydrobromide    Other reaction(s): agitation and dizziness   Dilaudid [hydromorphone Hcl] Itching   Pill only   Effexor Xr [venlafaxine]    Other reaction(s): More depressed (11/2017-neurology)   Penicillins    hives   Prednisone    Swelling, hives   Sulfamethoxazole-trimethoprim    Other reaction(s): swelling (2017)   Zoloft [sertraline]    Other reaction(s): nausea and swelling   Latex Rash        Medication List     TAKE these medications    Botox 200 units Solr Generic drug: Botulinum Toxin Type A Inject 155 Units as directed  every 3 (three) months. Inject 155units to head and neck intramuscularly every 3 months.   cyclobenzaprine 10 MG tablet Commonly known as: FLEXERIL Take 1 tablet (10 mg total) by mouth 3 (three) times daily as needed for muscle spasms.   gabapentin 100 MG capsule Commonly known as: NEURONTIN Take 1 capsule (100 mg total) by mouth every 8 (eight) hours.   loratadine 10 MG tablet Commonly known as: CLARITIN Take 10 mg by mouth daily as needed for allergies.   metoCLOPramide 10 MG tablet Commonly known as: Reglan Take 1 tablet (10 mg total) by mouth every 8 (eight) hours as needed (headache).   multivitamin-prenatal 27-0.8 MG Tabs tablet Take 1 tablet by mouth daily at 12 noon.   ondansetron 8 MG disintegrating tablet Commonly known as: ZOFRAN-ODT Take 1 tablet (8 mg total) by mouth every 8 (eight) hours as needed for nausea or vomiting.   oxyCODONE 5 MG immediate release tablet Commonly known as: Oxy IR/ROXICODONE Take 1-2 tablets (5-10 mg total) by mouth every 4 (four) hours as needed for moderate pain.   pantoprazole 40 MG tablet Commonly known as: PROTONIX Take 40 mg by mouth daily.   prenatal multivitamin Tabs tablet Take 1 tablet by mouth daily at 12 noon.   prochlorperazine 10 MG tablet Commonly known as: COMPAZINE Take 1 tablet (10 mg total) by mouth every 8 (eight) hours as needed for nausea or vomiting (headache).         Discharge home in stable condition Infant Feeding: Breast Infant Disposition:NICU Discharge instruction: per After Visit Summary and Postpartum booklet. Activity: Advance as tolerated. Pelvic rest for 6 weeks.  Diet: routine diet Anticipated Birth Control:  Status post salpingectomy Postpartum Appointment:6 weeks Additional Postpartum F/U: Incision check 2 weeks and BP check 1 week Future Appointments: Future Appointments  Date Time Provider [place]  11/29/2021  8:00 AM [facility] US 2 [place] [facility]  12/11/2021 10:45 AM Lyndal Pulley, DO [facility] None  02/03/2022  9:40 AM Tobb, Godfrey Pick, DO [facility]   Follow up Visit:  Follow-up Information     Christophe Louis, MD. Schedule an appointment as soon as possible for a visit in 1 week(s).   Specialty: Obstetrics and Gynecology Why: please schedule a nurse visit in 1 week for blood pressure check . schedule an appt in 2 weeks to see Dr. Landry Mellow for incision check Contact information: [address] [place] [address] [phone]                     11/29/2021 Drema Dallas, DO

## 2021-11-30 LAB — URINE CULTURE
Culture: 70000 — AB
Special Requests: NORMAL

## 2021-12-02 ENCOUNTER — Ambulatory Visit: Payer: Self-pay
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
## 2021-12-02 NOTE — Lactation Note (Signed)
This note was copied from a baby's chart.  NICU Lactation Consultation Note  Patient Name: Debbie [name] [id number] Date: 12/02/2021 Age:40 days   Subjective Reason for consult: Follow-up assessment Mom's milk is in and she is pumping often. She denies pumping difficulty or breast discomfort.  We reviewed IDF/breastfeeding assistance when baby is ready.  Objective Infant data: Mother's Current Feeding Choice: Breast Milk  Infant feeding assessment Scale for Readiness: 5   Maternal data: [id number]  C-Section, Low Transverse Pumping frequency: q3 Pumped volume: 120 mL   Pump: DEBP, Personal (wearable pump at home)  Assessment Maternal: Milk volume: Normal   Intervention/Plan Interventions: Education; Infant Driven Feeding Algorithm education  Tools: 59F feeding tube / Syringe; Pump  Plan: Consult Status: Follow-up  NICU Follow-up type: Weekly NICU follow up  Mother to continue pumping q3 and delivering milk to NICU.   [name] 12/02/2021, 11:58 AM

## 2021-12-02 NOTE — Telephone Encounter (Signed)
Called patient to offer tomorrow morning with Amy. Patient accepted.

## 2021-12-03 ENCOUNTER — Ambulatory Visit (INDEPENDENT_AMBULATORY_CARE_PROVIDER_SITE_OTHER): Payer: BC Managed Care – PPO | Admitting: Family Medicine

## 2021-12-03 ENCOUNTER — Encounter: Payer: Self-pay | Admitting: Family Medicine

## 2021-12-03 DIAGNOSIS — G43709 Chronic migraine without aura, not intractable, without status migrainosus: Secondary | ICD-10-CM

## 2021-12-03 NOTE — Progress Notes (Signed)
Botox- 200 units x 1 vial Lot: [id number] Expiration: 07/2024 NDC: 8550-1586-82  Bacteriostatic 0.9% Sodium Chloride- 96mL total Lot: [id number] Expiration: 06/25/23 NDC: 5217-4715-95  Dx: Z96.728 S/P

## 2021-12-04 ENCOUNTER — Ambulatory Visit: Payer: Self-pay

## 2021-12-04 NOTE — Lactation Note (Signed)
This note was copied from a baby's chart. Lactation Consultation Note  Patient Name: Debbie [name] Date: 12/04/2021 Reason for consult: Follow-up assessment;NICU baby;Breastfeeding assistance;Other (Comment) (SLP request, AMA) Age:40 days  Visited with mom of 38 days old LPI NICU female, SLP Abran Richard was helping mom with a breastfeeding assist when entering the room, baby had already been nursing for 15 minutes minutes and stayed latched on for another 15 but it was on/off and required constant repositioning.  Baby kept slipping off the breast but he would have some good bursts of swallows both NS and NNS (see LATCH score). Baby was falling asleep and self released from the breast, mom proceeded to burp baby afterwards. Reviewed IDF algorithm with mom, feeding cues, pumping schedule and lactogenesis II/III.  Maternal Data  Mom's supply is WNL. She requested to SLP information about a milk bank to possibly donate her breastmilk. Advised mom to wait until her supply is fully established (3-4 weeks) before making that decision. She continues to take Reglan 3 times/daily for her headaches but it seems like it's also  helping with her supply.  Feeding Mother's Current Feeding Choice: Breast Milk  LATCH Score Latch: Repeated attempts needed to sustain latch, nipple held in mouth throughout feeding, stimulation needed to elicit sucking reflex.  Audible Swallowing: A few with stimulation  Type of Nipple: Everted at rest and after stimulation (short shafted, but tissue is compressible)  Comfort (Breast/Nipple): Soft / non-tender  Hold (Positioning): Assistance needed to correctly position infant at breast and maintain latch.  LATCH Score: 7  Lactation Tools Discussed/Used Tools: Pump;Flanges Flange Size: 24 Breast pump type: Double-Electric Breast Pump Pump Education: Setup, frequency, and cleaning;Milk Storage Reason for Pumping: LPI in NICU Pumping frequency: 8 times/24 hours Pumped  volume: 180 mL  Interventions Interventions: Breast feeding basics reviewed;Assisted with latch;Skin to skin;Breast massage;Hand express;Breast compression;Support pillows;Adjust position;DEBP;Education  Plan of care   Encouraged mom to continue pumping consistently every 3 hours, at least 8 pumping sessions/24 hours She'll continue taking baby to breast on feeding cues   Visitor present. All questions and concerns answered, mom to call NICU LC PRN.   Discharge Pump: DEBP;Personal (wearable pump at home)  Consult Status Consult Status: Follow-up Date: 12/04/21 Follow-up type: In-patient   Trystian Crisanto Francene Boyers 12/04/2021, 3:38 PM

## 2021-12-05 ENCOUNTER — Ambulatory Visit: Payer: Self-pay

## 2021-12-05 ENCOUNTER — Ambulatory Visit: Payer: BC Managed Care – PPO

## 2021-12-05 NOTE — Lactation Note (Addendum)
This note was copied from a baby's chart. Lactation Consultation Note  Patient Name: Debbie [name] Date: 12/05/2021 Reason for consult: Follow-up assessment;NICU baby;Late-preterm 34-36.6wks;Breastfeeding assistance;Other (Comment);RN request Age:40 days  Lactation followed up with Mrs. Schurman for 1200 feeding. Baby was in cradle hold on the right breast upon entry and not latched. Baby [name] appeared to have hiccups at the breast. Ms. Shipp placed him upright on his shoulder and soothed him. He fell asleep. When hiccups subsided, she placed him back in cradle hold on the right breast, but he did not cue. We discontinued the attempt.   I provided some suggestions for effective positioning in cradle hold. We also discussed her pumping volume, which appears to be WNL with consistent pumping.  NP entered and observed baby, and we discussed use of a NS to help anchor baby to the breast more efficiently due to fatigue. I showed Mrs. Shugart how to place a size 24 NS on the breast and discussed how to use it. I recommended that lactation follow up tomorrow to assist with an attempt using the NS, and she agreed to be seen at 1200 on 1/13.  Maternal Data Has patient been taught Hand Expression?: Yes  Feeding Mother's Current Feeding Choice: Breast Milk  LATCH Score Latch: Too sleepy or reluctant, no latch achieved, no sucking elicited.  Audible Swallowing: None  Type of Nipple: Everted at rest and after stimulation  Comfort (Breast/Nipple): Soft / non-tender  Hold (Positioning): Assistance needed to correctly position infant at breast and maintain latch.  LATCH Score: 5   Lactation Tools Discussed/Used Tools: Nipple [place] Fuel;Pump Nipple shield size: 24 Breast pump type: Double-Electric Breast Pump Pump Education: Setup, frequency, and cleaning Reason for Pumping: NICU Pumping frequency: q3 hours Pumped volume: 180 mL (up to 3 ounces/breast)  Interventions Interventions:  Breast feeding basics reviewed;Assisted with latch;Skin to skin;Hand express;Adjust position;Support pillows;Education  Discharge    Consult Status Consult Status: Follow-up Date: 12/05/21 Follow-up type: In-patient    Lenore Manner 12/05/2021, 12:41 PM

## 2021-12-06 ENCOUNTER — Ambulatory Visit: Payer: Self-pay

## 2021-12-06 NOTE — Lactation Note (Signed)
This note was copied from a baby's chart. Lactation Consultation Note Mother and baby were scheduled for a lactation consult today at 10. Mother was sleeping soundly when I arrived. I spoke with FOB and the RN. FOB challenged baby with bottle at 9am feeding but recalls that baby was mostly uninterested at that time. We reviewed feeding norms at 36 weeks. FOB decided to let mom sleep and challenge bf'ing at a later feeding today or tomorrow. [facility] team will plan f/u tomorrow to further assist. No charge for this brief encounter.   Patient Name: Debbie [name] Date: 12/06/2021   Age:40 days  Feeding Nipple Type: Dr. Myra Gianotti Preemie   Debbie [name] 12/06/2021, 11:45 AM

## 2021-12-07 ENCOUNTER — Ambulatory Visit: Payer: Self-pay

## 2021-12-07 NOTE — Lactation Note (Signed)
This note was copied from a baby's chart. Lactation Consultation Note  Patient Name: Debbie [name] Date: 12/07/2021 Reason for consult: Follow-up assessment;NICU baby Age:40 days  Lactation followed up with [name] and assisted with latching baby [name] to the right breast in cradle hold. Baby latches readily. [name] noted that baby occasionally clamps down on the breast. He fed for a short time (2 minutes) and fell asleep. Baby just had a diaper change prior to entry, and he had a bath earlier today. We dicussed LPI behaviors and the probably that baby lacked endurance to maintain a full breast feeding session.  [name] is pumping consistently and obtaining adequate volumes. We reviewed her pumping schedule and her at-home pumps. [name] is aware of some of the risks of using a wearable pump such as the MomCozy, but she feels that it works effectively for her.  I encouraged [name] to call for follow up lactation help this week. She verbalized understanding.  Maternal Data Has patient been taught Hand Expression?: Yes  Feeding Mother's Current Feeding Choice: Breast Milk Nipple Type: Dr. Myra Gianotti Preemie  LATCH Score Latch: Repeated attempts needed to sustain latch, nipple held in mouth throughout feeding, stimulation needed to elicit sucking reflex.  Audible Swallowing: A few with stimulation  Type of Nipple: Everted at rest and after stimulation  Comfort (Breast/Nipple): Soft / non-tender  Hold (Positioning): Assistance needed to correctly position infant at breast and maintain latch.  LATCH Score: 7   Lactation Tools Discussed/Used Tools: Pump Breast pump type: Double-Electric Breast Pump Pump Education: Setup, frequency, and cleaning Reason for Pumping: NICU Pumping frequency: 8 times a day Pumped volume: 180 mL  Discharge Pump: Personal (Medela and momcozy)  Consult Status Consult Status: Follow-up Date: 12/07/21 Follow-up type:  In-patient    Lenore Manner 12/07/2021, 3:47 PM

## 2021-12-10 ENCOUNTER — Inpatient Hospital Stay (HOSPITAL_COMMUNITY): Admit: 2021-12-10 | Payer: BC Managed Care – PPO | Admitting: Obstetrics and Gynecology

## 2021-12-10 ENCOUNTER — Ambulatory Visit: Payer: Self-pay

## 2021-12-10 ENCOUNTER — Telehealth (HOSPITAL_COMMUNITY): Payer: Self-pay | Admitting: *Deleted

## 2021-12-10 NOTE — Lactation Note (Signed)
This note was copied from a baby's chart.  NICU Lactation Consultation Note  Patient Name: Debbie [name] [id number] Date: 12/10/2021 Age:40 wk.o.   Subjective Reason for consult: Weekly NICU follow-up Mother continues to pump frequently and without difficulty. She and baby continue to practice bf'ing during his feeding times. We reviewed feeding norms at 37 weeks. Mother is comfortable positioning / latching. She is aware of LC services prn. Mother is agreeable to an observed bf'ing to mark progress.   Objective Infant data: Mother's Current Feeding Choice: Breast Milk  Infant feeding assessment Scale for Readiness: 2 Scale for Quality: 4   Maternal data: [id number]  C-Section, Low Transverse Pumping frequency: 6 x day Pumped volume: 180 mL   Pump: Personal (Medela and momcozy)  Assessment Infant: LATCH Score: 7  Maternal: Milk volume: Normal   Intervention/Plan Interventions: Education; Infant Driven Feeding Algorithm education  Plan: Consult Status: Follow-up  NICU Follow-up type: Assist with IDF-2 (Mother does not need to pre-pump before breastfeeding)  LC will plan f/u to observe baby at breast. LC provided additional bottles at [facility] request.   [name] 12/10/2021, 9:43 AM

## 2021-12-10 NOTE — Telephone Encounter (Signed)
Attempted Hospital Discharge Follow-Up Call.  Left voice mail requesting that patient return RN's phone call.  

## 2021-12-11 ENCOUNTER — Ambulatory Visit: Payer: Self-pay

## 2021-12-11 ENCOUNTER — Ambulatory Visit: Payer: BC Managed Care – PPO | Admitting: Family Medicine

## 2021-12-11 NOTE — Lactation Note (Signed)
This note was copied from a baby's chart.  NICU Lactation Consultation Note  Patient Name: Debbie [name] [id number] Date: 12/11/2021 Age:40 wk.o.   Subjective Reason for consult: Follow-up assessment Mother continues to offer breast and bottle. She is comfortable with positioning/latch. Mother pumps often.   Mother requests observed bf'ing tomorrow at 1200.  Objective Infant data: Mother's Current Feeding Choice: Breast Milk  Infant feeding assessment Scale for Readiness: 2 Scale for Quality: 2    Maternal data: [id number]  C-Section, Low Transverse Pumping frequency: 6xday Pumped volume: 180 mL  Pump: Personal (Medela and momcozy)  Assessment Maternal: Milk volume: Normal   Intervention/Plan Interventions: Education; Infant Driven Feeding Algorithm education  Plan: Consult Status: Follow-up  NICU Follow-up type: Assist with IDF-2 (Mother does not need to pre-pump before breastfeeding)  LC will plan f/u visit tomorrow to observe baby at breast.   [name] 12/11/2021, 11:42 AM

## 2021-12-12 ENCOUNTER — Ambulatory Visit: Payer: BC Managed Care – PPO

## 2021-12-12 ENCOUNTER — Ambulatory Visit: Payer: Self-pay

## 2021-12-12 NOTE — Lactation Note (Signed)
This note was copied from a baby's chart. Lactation Consultation Note  Patient Name: Debbie [name] [id number] Date: 12/12/2021 Reason for consult: Follow-up assessment;Mother's request;NICU baby;Late-preterm 34-36.6wks Age:40 wk.o.  Lactation followed up with Debbie [name] to assist and observe with breast feeding her 60 week old son, Debbie [name]. We began the consult slightly before the scheduled feeding time (11:40), and at this time Debbie [name] was not yet ready to wake and latch. We discussed her progress. Debbie [name] states that she is pumping strong volumes. She notes that he is latching to the breast and softening the tissue with feeding. Debbie [name] breast fed well for 10 minutes and then released the breast and appeared sleepy. She noted that he received a portion of the feeding via gavage based on the IDF protocol. She states that he was spitting up with the gavage feeding.    Debbie [name] asked if baby may be getting sufficient volume from the breast in 10 minutes. She feels that he may be due to her strong milk volume (she pumps 2-3/breast each session). We discussed the protocol for moving a baby from IDF to ad lib, and I encouraged her to continue to monitor Debbie [name] for consistent feeding cues at touch times and consistent latches.  I called the RN following my visit, and she stated that baby Debbie [name] woke up and fed for approximately 16 minutes just after my consult. As per the IDF algorithm, he was not supplemented.   Maternal Data Has patient been taught Hand Expression?: Yes Does the patient have breastfeeding experience prior to this delivery?: Yes  Feeding Mother's Current Feeding Choice: Breast Milk Nipple Type: Dr. Myra Gianotti Preemie  LATCH Score Latch: Too sleepy or reluctant, no latch achieved, no sucking elicited.  Audible Swallowing: Spontaneous and intermittent  Type of Nipple: Everted at rest and after stimulation  Comfort (Breast/Nipple): Soft / non-tender  Hold  (Positioning): No assistance needed to correctly position infant at breast.  LATCH Score: 8   Lactation Tools Discussed/Used Tools: Nipple Shields Nipple shield size: 24 Breast pump type: Double-Electric Breast Pump Pump Education: Setup, frequency, and cleaning Reason for Pumping: NICU Pumping frequency: q3 hours Pumped volume: 150 mL  Interventions Interventions: Education;Skin to skin;Adjust position;Support pillows  Consult Status Consult Status: Follow-up Date: 12/12/21 Follow-up type: In-patient    Lenore Manner 12/12/2021, 12:43 PM

## 2021-12-13 ENCOUNTER — Ambulatory Visit: Payer: Self-pay

## 2021-12-13 NOTE — Lactation Note (Signed)
This note was copied from a baby's chart. Lactation Consultation Note  Patient Name: Debbie [name] [id number] Date: 12/13/2021 Reason for consult: Follow-up assessment;NICU baby;Early term 37-38.6wks;Breastfeeding assistance Age:40 wk.o.  Visited with mom of 44 59/3 weeks old ETI NICU female, she's a P2 and requested a feeding assist per previous LC. Mom first voiced she was planning on doing a bottle feeding for the 12 pm feeding, but after she saw Carlos she said she could try to BF instead. LC assisted with latching and positioning in cross cradle hold to the left breast, baby eagerly latched and started sucking but as the feeding progressed, he the swallows were more spaced out.   Mom voiced she's been changing his diaper in the middle of the feed to wake him up; but SLP Verdis Frederickson advised to cluster the cares and do it all together before feedings to avoid stress cues. Mom voiced when she does that the baby just falls asleep and won't eat. She wants to BF but also want to do bottles; currently on ultra preemie nipple by SLP recommendation.  Once baby stopped sucking and self released from the breast, mom proceeded to burp but baby did not wake up to go back to breast again; he's stamina also gradually decreases as the feeding progresses (see LATCH score). Reviewed feeding cues, stressors and IDF 2.  Feeding Mother's Current Feeding Choice: Breast Milk Nipple Type: Dr. Roosvelt Harps Preemie  LATCH Score Latch: Repeated attempts needed to sustain latch, nipple held in mouth throughout feeding, stimulation needed to elicit sucking reflex. (baby needed some stimulation after taking "breaks" to continue sucking at the breast)  Audible Swallowing: Spontaneous and intermittent (pool of milk also noted on NS # 24)  Type of Nipple: Everted at rest and after stimulation  Comfort (Breast/Nipple): Soft / non-tender  Hold (Positioning): No assistance needed to correctly position infant at breast. (minimal assistance  needed)  LATCH Score: 9  Lactation Tools Discussed/Used Tools: Nipple Shields Nipple shield size: 24  Interventions Interventions: Breast feeding basics reviewed;Support pillows;Breast compression;Adjust position;Assisted with latch;Education  Plan of care   Encouraged mom to continue pumping consistently every 3 hours, at least 8 pumping sessions/24 hours She'll continue taking baby to breast on feeding cues Mom will continue working on bottle feedings as well   No other support person at this time. All questions and concerns answered, mom to call NICU LC PRN.  Discharge Pump: DEBP;Personal (Medela and Mom Cozy)  Consult Status Consult Status: Follow-up Date: 12/13/21 Follow-up type: In-patient   Debbie [name] 12/13/2021, 12:43 PM

## 2021-12-19 ENCOUNTER — Ambulatory Visit: Payer: BC Managed Care – PPO

## 2021-12-25 ENCOUNTER — Encounter (HOSPITAL_COMMUNITY): Payer: Self-pay | Admitting: Obstetrics and Gynecology

## 2022-01-07 NOTE — Progress Notes (Signed)
[place] Phone: [phone] Subjective:   [name], am serving as a scribe for Dr. Hulan Saas.  This visit occurred during the SARS-CoV-2 public health emergency.  Safety protocols were in place, including screening questions prior to the visit, additional usage of staff PPE, and extensive cleaning of exam room while observing appropriate contact time as indicated for disinfecting solutions.  I'm seeing this patient by the request  of:  Leeroy Cha, MD  CC: Neck and back pain follow-up  UTM:[id number]  Gwenna Fuston is a 41 y.o. female coming in with complaint of back and neck pain. OMT 11/14/2021. Patient states that she is doing much better after having her child. Back pain has decreased.  Patient still having some discomfort.  Can catch her from time to time but nothing as severe.  Patient did have a C-section and then child was in the NICU secondary to being premature.  Patient states now trying to make improvement  Medications patient has been prescribed: None  Taking:         Reviewed prior external information including notes and imaging from previsou exam, outside providers and external EMR if available.   As well as notes that were available from care everywhere and other healthcare systems.  Past medical history, social, surgical and family history all reviewed in electronic medical record.  No pertanent information unless stated regarding to the chief complaint.   Past Medical History:  Diagnosis Date   Anxiety    self reported   Chronic back pain 2021   low back/hip; had nerve ablation in lumbar/sacral joint   Depression    self reported   Hypertension    Maxillary sinus cyst    left   Migraine    Multiple thyroid nodules    3; 1.9 cm and 2 cm, 0.6 cm, will see general  surgeon   No pertinent past medical history    Ovarian cyst    UTI (urinary tract infection)      Allergies  Allergen Reactions   Tylenol [Acetaminophen] Anaphylaxis   Aspirin Hives    Unknown childhood rxn   Benadryl [Diphenhydramine Hcl] Itching   Buspirone     Other reaction(s): migraines   Citalopram Hydrobromide     Other reaction(s): agitation and dizziness   Dilaudid [Hydromorphone Hcl] Itching    Pill only   Effexor Xr [Venlafaxine]     Other reaction(s): More depressed (11/2017-neurology)   Penicillins     hives   Prednisone     Swelling, hives   Sulfamethoxazole-Trimethoprim     Other reaction(s): swelling (2017)   Zoloft [Sertraline]     Other reaction(s): nausea and swelling   Latex Rash     Review of Systems:  No headache, visual changes, nausea, vomiting, diarrhea, constipation, dizziness, abdominal pain, skin rash, fevers, chills, night sweats, weight loss, swollen lymph nodes, body aches, joint swelling, chest pain, shortness of breath, mood changes. POSITIVE muscle aches  Objective  Blood pressure 104/82, pulse 89, height 5\' 3"  (1.6 m), last menstrual period 03/26/2021, SpO2 97 %, unknown if currently breastfeeding.   General: No apparent distress alert and oriented x3 mood and affect normal, dressed appropriately.  HEENT: Pupils equal, extraocular movements intact  Respiratory: Patient's speak in full sentences and does not appear short of breath  Cardiovascular: No lower extremity edema, non tender, no erythema  Low back exam does have some loss of lordosis.  Patient does  have some tightness noted with FABER test bilaterally.  Difficulty with any type of rotation of the thoracolumbar juncture.  Osteopathic findings  C4 flexed rotated and side bent right C7 flexed rotated and side bent left T3 extended rotated and side bent right inhaled rib T6 extended rotated and side bent left L2 flexed rotated and side bent right Sacrum right on right       Assessment and Plan:  Degenerative disc disease, lumbar Chronic, with mild exacerbation.   Discussed with patient about icing regimen and home exercises.  Patient is breast-feeding so still need to be careful with type of different medications to use.  Discussed icing regimen and home exercises.  Discussed which activities to do and which ones to avoid.  Increase activity slowly over the course the next several weeks.  Follow-up with me again in 6 to 8 weeks.    Nonallopathic problems  Decision today to treat with OMT was based on Physical Exam  After verbal consent patient was treated with HVLA, ME, FPR techniques in cervical, rib, thoracic, lumbar, and sacral  areas  Patient tolerated the procedure well with improvement in symptoms  Patient given exercises, stretches and lifestyle modifications  See medications in patient instructions if given  Patient will follow up in 4-8 weeks      The above documentation has been reviewed and is accurate and complete Lyndal Pulley, DO        Note: This dictation was prepared with Dragon dictation along with smaller phrase technology. Any transcriptional errors that result from this process are unintentional.

## 2022-01-08 ENCOUNTER — Other Ambulatory Visit: Payer: Self-pay

## 2022-01-08 ENCOUNTER — Ambulatory Visit (INDEPENDENT_AMBULATORY_CARE_PROVIDER_SITE_OTHER): Payer: BC Managed Care – PPO | Admitting: Family Medicine

## 2022-01-08 ENCOUNTER — Encounter: Payer: Self-pay | Admitting: Family Medicine

## 2022-01-08 VITALS — BP 104/82 | HR 89 | Ht 63.0 in

## 2022-01-08 DIAGNOSIS — M9904 Segmental and somatic dysfunction of sacral region: Secondary | ICD-10-CM | POA: Diagnosis not present

## 2022-01-08 DIAGNOSIS — M9908 Segmental and somatic dysfunction of rib cage: Secondary | ICD-10-CM | POA: Diagnosis not present

## 2022-01-08 DIAGNOSIS — M9901 Segmental and somatic dysfunction of cervical region: Secondary | ICD-10-CM | POA: Diagnosis not present

## 2022-01-08 DIAGNOSIS — M5136 Other intervertebral disc degeneration, lumbar region: Secondary | ICD-10-CM

## 2022-01-08 DIAGNOSIS — M9902 Segmental and somatic dysfunction of thoracic region: Secondary | ICD-10-CM | POA: Diagnosis not present

## 2022-01-08 DIAGNOSIS — M9903 Segmental and somatic dysfunction of lumbar region: Secondary | ICD-10-CM | POA: Diagnosis not present

## 2022-01-08 NOTE — Patient Instructions (Signed)
Good to see you Thanks for showing me little one Slow and sweet Love idea of pelvic floor and walking See me in 6 weeks

## 2022-01-08 NOTE — Assessment & Plan Note (Signed)
Chronic, with mild exacerbation.  Discussed with patient about icing regimen and home exercises.  Patient is breast-feeding so still need to be careful with type of different medications to use.  Discussed icing regimen and home exercises.  Discussed which activities to do and which ones to avoid.  Increase activity slowly over the course the next several weeks.  Follow-up with me again in 6 to 8 weeks.

## 2022-02-03 ENCOUNTER — Encounter: Payer: Self-pay | Admitting: Cardiology

## 2022-02-03 ENCOUNTER — Other Ambulatory Visit: Payer: Self-pay

## 2022-02-03 ENCOUNTER — Ambulatory Visit (INDEPENDENT_AMBULATORY_CARE_PROVIDER_SITE_OTHER): Payer: BC Managed Care – PPO | Admitting: Cardiology

## 2022-02-03 VITALS — BP 120/82 | HR 110 | Ht 63.0 in | Wt 220.8 lb

## 2022-02-03 DIAGNOSIS — I2510 Atherosclerotic heart disease of native coronary artery without angina pectoris: Secondary | ICD-10-CM

## 2022-02-03 DIAGNOSIS — E781 Pure hyperglyceridemia: Secondary | ICD-10-CM

## 2022-02-03 DIAGNOSIS — O1413 Severe pre-eclampsia, third trimester: Secondary | ICD-10-CM | POA: Insufficient documentation

## 2022-02-03 DIAGNOSIS — E669 Obesity, unspecified: Secondary | ICD-10-CM

## 2022-02-03 DIAGNOSIS — E785 Hyperlipidemia, unspecified: Secondary | ICD-10-CM | POA: Insufficient documentation

## 2022-02-03 DIAGNOSIS — O99815 Abnormal glucose complicating the puerperium: Secondary | ICD-10-CM

## 2022-02-03 NOTE — Progress Notes (Incomplete)
Cardiology Office Note:    Date:  02/03/2022   ID:  Debbie [name], DOB [birth year], MRN [id number]  PCP:  Leeroy Cha, MD  Cardiologist:  Berniece Salines, DO  Electrophysiologist:  None   Referring MD: Leeroy Cha,*   " I am doing ok"  History of Present Illness:    Debbie [name] is a 41 y.o. female with a hx of severe preeclampsia with recent pregnancy status post C-section, obesity is here today for follow-up visit.  I saw the patient in December 2022 at that time we did not change any medication given the fact that she was doing well.  We discussed her testing results.  Unfortunately since I saw the patient she developed preeclampsia.  He has had her baby.  Thankfully she is doing well postpartum.  No chest pain, blood pressure has been within target.  She does explain to me that she had an episode where her blood sugar was on the lower side and she had some bradycardia.  This has not recurred.  Past Medical History:  Diagnosis Date   Anxiety    self reported   Chronic back pain 2021   low back/hip; had nerve ablation in lumbar/sacral joint   Depression    self reported   Hypertension    Maxillary sinus cyst    left   Migraine    Multiple thyroid nodules    3; 1.9 cm and 2 cm, 0.6 cm, will see general  surgeon   No pertinent past medical history    Ovarian cyst    UTI (urinary tract infection)     Past Surgical History:  Procedure Laterality Date   CESAREAN SECTION  2013   CESAREAN SECTION N/A 11/26/2021   Procedure: REPEAT CESAREAN SECTION;  Surgeon: Drema Dallas, DO;  Location: [facility] LD [facility];  Service: Obstetrics;  Laterality: N/A;   laparscopy  2012   ruptured ectopic   radiofrequency ablation of lumbar spine N/A 08/07/2020   SHOULDER CAPSULORRHAPHY Right     Current Medications: Current Meds  Medication Sig   Botulinum Toxin Type A (BOTOX) 200 units SOLR Inject 155 Units as directed every 3 (three) months. Inject 155units to head and neck  intramuscularly every 3 months.   cyclobenzaprine (FLEXERIL) 10 MG tablet Take 1 tablet (10 mg total) by mouth 3 (three) times daily as needed for muscle spasms.   loratadine (CLARITIN) 10 MG tablet Take 10 mg by mouth daily as needed for allergies.   metoCLOPramide (REGLAN) 10 MG tablet Take 1 tablet (10 mg total) by mouth every 8 (eight) hours as needed (headache).   ondansetron (ZOFRAN-ODT) 8 MG disintegrating tablet Take 1 tablet (8 mg total) by mouth every 8 (eight) hours as needed for nausea or vomiting.   pantoprazole (PROTONIX) 40 MG tablet Take 40 mg by mouth daily.   Prenatal Vit-Fe Fumarate-FA (MULTIVITAMIN-PRENATAL) 27-0.8 MG TABS tablet Take 1 tablet by mouth daily at 12 noon.   prochlorperazine (COMPAZINE) 10 MG tablet Take 1 tablet (10 mg total) by mouth every 8 (eight) hours as needed for nausea or vomiting (headache).     Allergies:   Tylenol [acetaminophen], Aspirin, Benadryl [diphenhydramine hcl], Buspirone, Citalopram hydrobromide, Dilaudid [hydromorphone hcl], Effexor xr [venlafaxine], Penicillins, Prednisone, Sulfamethoxazole-trimethoprim, Zoloft [sertraline], and Latex   Social History   Socioeconomic History   Marital status: Married    Spouse name: Not on file   Number of children: 1   Years of education: Not on file   Highest education level: Bachelor's degree (e.g.,  BA, AB, BS)  Occupational History   Not on file  Tobacco Use   Smoking status: Never   Smokeless tobacco: Never  Vaping Use   Vaping Use: Never used  Substance and Sexual Activity   Alcohol use: Not Currently   Drug use: No   Sexual activity: Not Currently    Birth control/protection: None  Other Topics Concern   Not on file  Social History Narrative   Lives at home with her son & his father   Right handed   Drinks 1 cup of caffeine daily   Social Determinants of Health   Financial Resource Strain: Not on file  Food Insecurity: No Food Insecurity   Worried About Charity fundraiser in  the Last Year: Never true   Arboriculturist in the Last Year: Never true  Transportation Needs: Unknown   Lack of Transportation (Medical): No   Lack of Transportation (Non-Medical): Not on file  Physical Activity: Not on file  Stress: Not on file  Social Connections: Not on file     Family History: The patient's family history includes Cerebral aneurysm in her sister; Dementia in her father; Diabetes in her father; Heart disease in her father, maternal grandfather, and another family member; High Cholesterol in an other family member; Hypertension in her mother; Migraines in her sister; Seizures in her sister.  ROS:   Review of Systems  Constitution: Negative for decreased appetite, fever and weight gain.  HENT: Negative for congestion, ear discharge, hoarse voice and sore throat.   Eyes: Negative for discharge, redness, vision loss in right eye and visual halos.  Cardiovascular: Negative for chest pain, dyspnea on exertion, leg swelling, orthopnea and palpitations.  Respiratory: Negative for cough, hemoptysis, shortness of breath and snoring.   Endocrine: Negative for heat intolerance and polyphagia.  Hematologic/Lymphatic: Negative for bleeding problem. Does not bruise/bleed easily.  Skin: Negative for flushing, nail changes, rash and suspicious lesions.  Musculoskeletal: Negative for arthritis, joint pain, muscle cramps, myalgias, neck pain and stiffness.  Gastrointestinal: Negative for abdominal pain, bowel incontinence, diarrhea and excessive appetite.  Genitourinary: Negative for decreased libido, genital sores and incomplete emptying.  Neurological: Negative for brief paralysis, focal weakness, headaches and loss of balance.  Psychiatric/Behavioral: Negative for altered mental status, depression and suicidal ideas.  Allergic/Immunologic: Negative for HIV exposure and persistent infections.    EKGs/Labs/Other Studies Reviewed:    The following studies were reviewed  today:   EKG: None today   Zio monitor Patch Wear Time:  13 days and 15 hours September 16, 2021 Indication: Palpitation   Patient had a minimum HR of 61 bpm, maximum HR of 147 bpm, and average HR of 89 bpm.   Predominant underlying rhythm was Sinus Rhythm.   Premature atrial complexes were rare (<1.0%). Premature ventricular complexes were rare (<1.0%).   Symptoms associated with sinus rhythm.   Conclusion: Normal/unremarkable study with no evidence of significant arrhythmia.   TTE 09/20/2021 IMPRESSIONS     1. Left ventricular ejection fraction, by estimation, is 60 to 65%. The  left ventricle has normal function. The left ventricle has no regional  wall motion abnormalities. Left ventricular diastolic parameters were  normal. The average left ventricular  global longitudinal strain is -22.7 %. The global longitudinal strain is  normal.   2. Right ventricular systolic function is normal. The right ventricular  size is normal.   3. The mitral valve is normal in structure. No evidence of mitral valve  regurgitation. No  evidence of mitral stenosis.   4. The aortic valve is normal in structure. Aortic valve regurgitation is  not visualized. No aortic stenosis is present.   5. The inferior vena cava is normal in size with greater than 50%  respiratory variability, suggesting right atrial pressure of 3 mmHg.   Comparison(s): No prior Echocardiogram  Recent Labs: 07/11/2021: TSH 0.910 11/27/2021: ALT 15; BUN <5; Creatinine, Ser 0.72; Hemoglobin 13.1; Magnesium 4.4; Platelets 259; Potassium 4.3; Sodium 129  Recent Lipid Panel No results found for: CHOL, TRIG, HDL, CHOLHDL, VLDL, LDLCALC, LDLDIRECT  Physical Exam:    VS:  BP 120/82    Pulse (!) 110    Ht '5\' 3"'$  (1.6 m)    Wt 220 lb 12.8 oz (100.2 kg)    LMP 03/26/2021 (Approximate)    SpO2 97%    Breastfeeding Yes    BMI 39.11 kg/m     Wt Readings from Last 3 Encounters:  02/03/22 220 lb 12.8 oz (100.2 kg)  11/26/21 237 lb  (107.5 kg)  11/21/21 234 lb 9.6 oz (106.4 kg)     GEN: Well nourished, well developed in no acute distress HEENT: Normal NECK: No JVD; No carotid bruits LYMPHATICS: No lymphadenopathy CARDIAC: S1S2 noted,RRR, no murmurs, rubs, gallops RESPIRATORY:  Clear to auscultation without rales, wheezing or rhonchi  ABDOMEN: Soft, non-tender, non-distended, +bowel sounds, no guarding. EXTREMITIES: No edema, No cyanosis, no clubbing MUSCULOSKELETAL:  No deformity  SKIN: Warm and dry NEUROLOGIC:  Alert and oriented x 3, non-focal PSYCHIATRIC:  Normal affect, good insight  ASSESSMENT:    1. Postpartum Visit    2. history of Severe pre-eclampsia in third trimester   3. Obesity (BMI 30-39.9)   4. ASCVD (arteriosclerotic cardiovascular disease)   5. Hypertriglyceridemia    PLAN:     1.  History of preeclampsia-she has been checking her blood pressure postpartum this seems to be within normal limits.  I have  asked the patient to take her blood pressure daily and send me information within the next week or 2.  She is breast-feeding, which is suspect that her symptoms with the low sugar may be contributing to the fact that she is not having enough caloric intake.  She plans to work on this.  During this episode she had some bradycardia.  We will continue to monitor the patient.  She will get mobile Kardia to monitor her rhythm and rate.  2.  The patient understands the need to lose weight with diet and exercise. We have discussed specific strategies for this.  3.  We discussed cardiovascular risks reduction especially with her history of preeclampsia as well as hypertriglyceridemia.    Hypertriglyceridemia-we will repeat lipids in about 8 weeks.  The patient is in agreement with the above plan. The patient left the office in stable condition.  The patient will follow up in 6 months or sooner if needed.   Medication Adjustments/Labs and Tests Ordered: Current medicines are reviewed at length with  the patient today.  Concerns regarding medicines are outlined above.  No orders of the defined types were placed in this encounter.  No orders of the defined types were placed in this encounter.   Patient Instructions  Medication Instructions:  Your physician recommends that you continue on your current medications as directed. Please refer to the Current Medication list given to you today.  *If you need a refill on your cardiac medications before your next appointment, please call your pharmacy*   Lab Work: None If  you have labs (blood work) drawn today and your tests are completely normal, you will receive your results only by: [place] (if you have MyChart) OR A paper copy in the mail If you have any lab test that is abnormal or we need to change your treatment, we will call you to review the results.   Testing/Procedures: None   Follow-Up: At [facility], you and your health needs are our priority.  As part of our continuing mission to provide you with exceptional heart care, we have created designated Provider Care Teams.  These Care Teams include your primary Cardiologist (physician) and Advanced Practice Providers (APPs -  Physician Assistants and Nurse Practitioners) who all work together to provide you with the care you need, when you need it.  We recommend signing up for the patient portal called "MyChart".  Sign up information is provided on this After Visit Summary.  MyChart is used to connect with patients for Virtual Visits (Telemedicine).  Patients are able to view lab/test results, encounter notes, upcoming appointments, etc.  Non-urgent messages can be sent to your provider as well.   To learn more about what you can do with MyChart, go to [email or web address].    Your next appointment:   6 month(s)  The format for your next appointment:   In Person  Provider:   Berniece Salines, DO     Other Instructions  KardiaMobile  [email or web address]        FDA-cleared, clinical grade mobile EKG monitor: Jodelle Red is the most clinically-validated mobile EKG used by the world's leading cardiac care medical professionals With Basic service, know instantly if your heart rhythm is normal or if atrial fibrillation is detected, and email the last single EKG recording to yourself or your doctor Premium service, available for purchase through the Kardia app for $9.99 per month or $99 per year, includes unlimited history and storage of your EKG recordings, a monthly EKG summary report to share with your doctor, along with the ability to track your blood pressure, activity and weight Includes one KardiaMobile phone clip FREE SHIPPING: Standard delivery 1-3 business days. Orders placed by 11:00am PST will ship that afternoon. Otherwise, will ship next business day. All orders ship via [employer] from [place], [place] - sending an EKG [employer] and set up profile. Run EKG - by placing 1-2 fingers on the silver plates After EKG is complete - Download PDF  - Skip password (if you apply a password the provider will need it to view the EKG) Click share button (square with upward arrow) in bottom left corner To send: choose MyChart (first time log into MyChart)  Pop up window about sending ECG Click continue Choose type of message Choose provider Type subject and message Click send (EKG should be attached)  - To send additional EKGs in one message click the paperclip image and bottom of page to attach.      Adopting a Healthy Lifestyle.  Know what a healthy weight is for you (roughly BMI <25) and aim to maintain this   Aim for 7+ servings of fruits and vegetables daily   65-80+ fluid ounces of water or unsweet tea for healthy kidneys   Limit to max 1 drink of alcohol per day; avoid smoking/tobacco   Limit animal fats in diet for cholesterol and heart health - choose grass  fed whenever available   Avoid highly processed foods, and foods high in saturated/trans fats   Aim for  low stress - take time to unwind and care for your mental health   Aim for 150 min of moderate intensity exercise weekly for heart health, and weights twice weekly for bone health   Aim for 7-9 hours of sleep daily   When it comes to diets, agreement about the perfect plan isnt easy to find, even among the experts. Experts at the [employer] developed an idea known as the Healthy Eating Plate. Just imagine a plate divided into logical, healthy portions.   The emphasis is on diet quality:   Load up on vegetables and fruits - one-half of your plate: Aim for color and variety, and remember that potatoes dont count.   Go for whole grains - one-quarter of your plate: Whole wheat, barley, wheat berries, quinoa, oats, brown rice, and foods made with them. If you want pasta, go with whole wheat pasta.   Protein power - one-quarter of your plate: Fish, chicken, beans, and nuts are all healthy, versatile protein sources. Limit red meat.   The diet, however, does go beyond the plate, offering a few other suggestions.   Use healthy plant oils, such as olive, canola, soy, corn, sunflower and peanut. Check the labels, and avoid partially hydrogenated oil, which have unhealthy trans fats.   If youre thirsty, drink water. Coffee and tea are good in moderation, but skip sugary drinks and limit milk and dairy products to one or two daily servings.   The type of carbohydrate in the diet is more important than the amount. Some sources of carbohydrates, such as vegetables, fruits, whole grains, and beans-are healthier than others.   Finally, stay active  Signed, Berniece Salines, DO  02/03/2022 10:46 AM    [place]

## 2022-02-03 NOTE — Patient Instructions (Addendum)
Medication Instructions:  ?Your physician recommends that you continue on your current medications as directed. Please refer to the Current Medication list given to you today.  ?*If you need a refill on your cardiac medications before your next appointment, please call your pharmacy* ? ? ?Lab Work: ?None ?If you have labs (blood work) drawn today and your tests are completely normal, you will receive your results only by: ?MyChart Message (if you have MyChart) OR ?A paper copy in the mail ?If you have any lab test that is abnormal or we need to change your treatment, we will call you to review the results. ? ? ?Testing/Procedures: ?None ? ? ?Follow-Up: ?At [facility], you and your health needs are our priority.  As part of our continuing mission to provide you with exceptional heart care, we have created designated Provider Care Teams.  These Care Teams include your primary Cardiologist (physician) and Advanced Practice Providers (APPs -  Physician Assistants and Nurse Practitioners) who all work together to provide you with the care you need, when you need it. ? ?We recommend signing up for the patient portal called "MyChart".  Sign up information is provided on this After Visit Summary.  MyChart is used to connect with patients for Virtual Visits (Telemedicine).  Patients are able to view lab/test results, encounter notes, upcoming appointments, etc.  Non-urgent messages can be sent to your provider as well.   ?To learn more about what you can do with MyChart, go to [email or web address].   ? ?Your next appointment:   ?6 month(s) ? ?The format for your next appointment:   ?In Person ? ?Provider:   ?Berniece Salines, DO   ? ? ?Other Instructions ? KardiaMobile [email or web address] ? ? ? ? ? ? ? ?FDA-cleared, clinical grade mobile EKG monitor: Jodelle Red is the most clinically-validated mobile EKG used by the world's leading cardiac care medical professionals With Basic service, know  instantly if your heart rhythm is normal or if atrial fibrillation is detected, and email the last single EKG recording to yourself or your doctor Premium service, available for purchase through the Kardia app for $9.99 per month or $99 per year, includes unlimited history and storage of your EKG recordings, a monthly EKG summary report to share with your doctor, along with the ability to track your blood pressure, activity and weight Includes one KardiaMobile phone clip FREE SHIPPING: Standard delivery 1-3 business days. Orders placed by 11:00am PST will ship that afternoon. Otherwise, will ship next business day. All orders ship via [employer] from [place], [place] ? ?  ?[employer] - sending an EKG ?Download app and set up profile. ?Run EKG - by placing 1-2 fingers on the silver plates ?After EKG is complete - Download PDF  ?- Skip password (if you apply a password the provider will need it to view the EKG) ?Click share button (square with upward arrow) in bottom left corner ?To send: choose MyChart (first time log into MyChart)  ?Pop up window about sending ECG ?Click continue ?Choose type of message ?Choose provider ?Type subject and message ?Click send (EKG should be attached)  ?- To send additional EKGs in one message click the paperclip image and bottom of page to attach.   ? ?

## 2022-02-11 ENCOUNTER — Telehealth: Payer: Self-pay | Admitting: Family Medicine

## 2022-02-11 MED ORDER — BOTOX 200 UNITS IJ SOLR: 155.0000 [IU] | INTRAMUSCULAR | 3 refills | Status: DC

## 2022-02-11 NOTE — Telephone Encounter (Signed)
Please send Botox Rx refill to [facility]. ?

## 2022-02-11 NOTE — Telephone Encounter (Signed)
[facility]ill has been sent to [facility] for the pt ?

## 2022-02-18 NOTE — Progress Notes (Signed)
?Charlann Boxer D.O. ?[facility] ?[place] ?Phone: [phone] ?Subjective:   ?I, Jacqualin Combes, am serving as a scribe for Dr. Hulan Saas. ? ?This visit occurred during the SARS-CoV-2 public health emergency.  Safety protocols were in place, including screening questions prior to the visit, additional usage of staff PPE, and extensive cleaning of exam room while observing appropriate contact time as indicated for disinfecting solutions.  ? ?I'm seeing this patient by the request  of:  Leeroy Cha, MD ? ?CC: Back and neck pain follow-up ? ?[id number]  ?Debbie [name] is a 41 y.o. female coming in with complaint of back and neck pain. OMT 01/08/2022. Patient states that she has been doing well. No issues since last visit.   ? ?Medications patient has been prescribed: None ? ? ? ?  ? ? ?Reviewed prior external information including notes and imaging from previsou exam, outside providers and external EMR if available.  ? ?As well as notes that were available from care everywhere and other healthcare systems. ? ?Past medical history, social, surgical and family history all reviewed in electronic medical record.  No pertanent information unless stated regarding to the chief complaint.  ? ?Past Medical History:  ?Diagnosis Date  ? Anxiety   ? self reported  ? Chronic back pain 2021  ? low back/hip; had nerve ablation in lumbar/sacral joint  ? Depression   ? self reported  ? Hypertension   ? Maxillary sinus cyst   ? left  ? Migraine   ? Multiple thyroid nodules   ? 3; 1.9 cm and 2 cm, 0.6 cm, will see general  surgeon  ? No pertinent past medical history   ? Ovarian cyst   ? UTI (urinary tract infection)   ?  ?Allergies  ?Allergen Reactions  ? Tylenol [Acetaminophen] Anaphylaxis  ? Aspirin Hives  ?  Unknown childhood rxn  ? Benadryl [Diphenhydramine Hcl] Itching  ? Buspirone   ?  Other reaction(s): migraines  ? Citalopram Hydrobromide   ?  Other reaction(s): agitation  and dizziness  ? Dilaudid [Hydromorphone Hcl] Itching  ?  Pill only  ? Effexor Xr [Venlafaxine]   ?  Other reaction(s): More depressed (11/2017-neurology)  ? Penicillins   ?  hives  ? Prednisone   ?  Swelling, hives  ? Sulfamethoxazole-Trimethoprim   ?  Other reaction(s): swelling (2017)  ? Zoloft [Sertraline]   ?  Other reaction(s): nausea and swelling  ? Latex Rash  ? ? ? ?Review of Systems: ? No headache, visual changes, nausea, vomiting, diarrhea, constipation, dizziness, abdominal pain, skin rash, fevers, chills, night sweats, weight loss, swollen lymph nodes, body aches, joint swelling, chest pain, shortness of breath, mood changes. POSITIVE muscle aches ? ?Objective  ?Blood pressure 124/82, pulse 84, height '5\' 3"'$  (1.6 m), weight 220 lb (99.8 kg), SpO2 97 %, currently breastfeeding. ?  ?General: No apparent distress alert and oriented x3 mood and affect normal, dressed appropriately.  ?HEENT: Pupils equal, extraocular movements intact  ?Respiratory: Patient's speak in full sentences and does not appear short of breath  ?Cardiovascular: No lower extremity edema, non tender, no erythema  ?Gait normal with good balance and coordination.  ?MSK:  Non tender with full range of motion and good stability and symmetric strength and tone of shoulders, elbows, wrist, hip, knee and ankles bilaterally.  ?Back -still has some weakness with core strength.  Tenderness to palpation of the paraspinal musculature.  Some numbness noted she states in the  buttocks area for 5 out of 5 strength noted.  Neck exam does have some mild limited sidebending bilaterally. ? ?Osteopathic findings ? ?C2 flexed rotated and side bent right ?C7 flexed rotated and side bent left ?T3 extended rotated and side bent right inhaled rib ?T7 extended rotated and side bent left ?L2 flexed rotated and side bent right ?Sacrum right on right ? ? ? ? ?  ?Assessment and Plan: ? ?Degenerative disc disease, cervical ?Patient is responding relatively well to  osteopathic manipulation.  I do think that further outpatient becomes with her pregnancy.  She will continue to make improvement.  Discussed with patient about icing regimen and home exercises. ? ?Degenerative disc disease, lumbar ?Discussed which activities to do and which ones to avoid.  Increase activity slowly.  Discussed seeing if needed the gabapentin but at the moment patient is making good strides.  We will follow-up with me again in 2 to 3 months ?  ? ?Nonallopathic problems ? ?Decision today to treat with OMT was based on Physical Exam ? ?After verbal consent patient was treated with HVLA, ME, FPR techniques in cervical, rib, thoracic, lumbar, and sacral  areas ? ?Patient tolerated the procedure well with improvement in symptoms ? ?Patient given exercises, stretches and lifestyle modifications ? ?See medications in patient instructions if given ? ?Patient will follow up in 4-8 weeks ? ?  ? ?The above documentation has been reviewed and is accurate and complete Lyndal Pulley, DO ? ? ? ?  ? ? Note: This dictation was prepared with Dragon dictation along with smaller phrase technology. Any transcriptional errors that result from this process are unintentional.    ?  ?  ? ?

## 2022-02-18 NOTE — Telephone Encounter (Signed)
Received (1) 200 unit vial of Botox from [facility]. ?

## 2022-02-19 ENCOUNTER — Ambulatory Visit (INDEPENDENT_AMBULATORY_CARE_PROVIDER_SITE_OTHER): Payer: BC Managed Care – PPO | Admitting: Family Medicine

## 2022-02-19 ENCOUNTER — Encounter: Payer: Self-pay | Admitting: Family Medicine

## 2022-02-19 VITALS — BP 124/82 | HR 84 | Ht 63.0 in | Wt 220.0 lb

## 2022-02-19 DIAGNOSIS — M9901 Segmental and somatic dysfunction of cervical region: Secondary | ICD-10-CM

## 2022-02-19 DIAGNOSIS — M5030 Other cervical disc degeneration, unspecified cervical region: Secondary | ICD-10-CM

## 2022-02-19 DIAGNOSIS — M9903 Segmental and somatic dysfunction of lumbar region: Secondary | ICD-10-CM

## 2022-02-19 DIAGNOSIS — M5136 Other intervertebral disc degeneration, lumbar region: Secondary | ICD-10-CM | POA: Diagnosis not present

## 2022-02-19 DIAGNOSIS — M9902 Segmental and somatic dysfunction of thoracic region: Secondary | ICD-10-CM | POA: Diagnosis not present

## 2022-02-19 DIAGNOSIS — M9908 Segmental and somatic dysfunction of rib cage: Secondary | ICD-10-CM

## 2022-02-19 DIAGNOSIS — M9904 Segmental and somatic dysfunction of sacral region: Secondary | ICD-10-CM | POA: Diagnosis not present

## 2022-02-19 DIAGNOSIS — M51369 Other intervertebral disc degeneration, lumbar region without mention of lumbar back pain or lower extremity pain: Secondary | ICD-10-CM

## 2022-02-19 NOTE — Assessment & Plan Note (Signed)
Discussed which activities to do and which ones to avoid.  Increase activity slowly.  Discussed seeing if needed the gabapentin but at the moment patient is making good strides.  We will follow-up with me again in 2 to 3 months ?

## 2022-02-19 NOTE — Patient Instructions (Signed)
Thanks for bringing baby ?See me in 2 months ?

## 2022-02-19 NOTE — Assessment & Plan Note (Signed)
Patient is responding relatively well to osteopathic manipulation.  I do think that further outpatient becomes with her pregnancy.  She will continue to make improvement.  Discussed with patient about icing regimen and home exercises. ?

## 2022-02-20 ENCOUNTER — Other Ambulatory Visit: Payer: Self-pay | Admitting: Family Medicine

## 2022-02-20 ENCOUNTER — Encounter: Payer: Self-pay | Admitting: Family Medicine

## 2022-02-20 MED ORDER — SUMATRIPTAN SUCCINATE 6 MG/0.5ML ~~LOC~~ SOSY: 6.0000 mg | PREFILLED_SYRINGE | SUBCUTANEOUS | 0 refills | Status: DC | PRN

## 2022-03-04 ENCOUNTER — Ambulatory Visit (INDEPENDENT_AMBULATORY_CARE_PROVIDER_SITE_OTHER): Payer: BC Managed Care – PPO | Admitting: Family Medicine

## 2022-03-04 DIAGNOSIS — G43709 Chronic migraine without aura, not intractable, without status migrainosus: Secondary | ICD-10-CM | POA: Diagnosis not present
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
## 2022-03-04 NOTE — Progress Notes (Signed)
Botox- 200 units x 1 vial ?Lot: [id number] ?Expiration: 07/2024 ?[place]: [phone] ? ?Bacteriostatic 0.9% Sodium Chloride- 89m total ?Lot: [id number]?Expiration: 06/25/2023 ?[place] [id number]? ?Dx: GP69.249?S/P   ?

## 2022-04-04 LAB — LIPID PANEL
Chol/HDL Ratio: 5.8 ratio — ABNORMAL HIGH (ref 0.0–4.4)
Cholesterol, Total: 219 mg/dL — ABNORMAL HIGH (ref 100–199)
HDL: 38 mg/dL — ABNORMAL LOW (ref >39)
LDL Chol Calc (NIH): 139 mg/dL — ABNORMAL HIGH (ref 0–99)
Triglycerides: 234 mg/dL — ABNORMAL HIGH (ref 0–149)
VLDL Cholesterol Cal: 42 mg/dL — ABNORMAL HIGH (ref 5–40)

## 2022-04-07 ENCOUNTER — Encounter: Payer: Self-pay | Admitting: Cardiology

## 2022-04-14 ENCOUNTER — Other Ambulatory Visit: Payer: Self-pay | Admitting: Gastroenterology

## 2022-04-14 DIAGNOSIS — R198 Other specified symptoms and signs involving the digestive system and abdomen: Secondary | ICD-10-CM

## 2022-04-14 DIAGNOSIS — K2950 Unspecified chronic gastritis without bleeding: Secondary | ICD-10-CM

## 2022-04-14 DIAGNOSIS — R1084 Generalized abdominal pain: Secondary | ICD-10-CM

## 2022-04-16 ENCOUNTER — Telehealth: Payer: Self-pay | Admitting: Family Medicine

## 2022-04-16 NOTE — Progress Notes (Signed)
[place] Phone: [phone] Subjective:   Debbie [name], am serving as a scribe for Dr. Hulan Saas.   I'm seeing this patient by the request  of:  Leeroy Cha, MD  CC: Back and neck pain following  CZY:[id number]  Debbie [name] is a 41 y.o. female coming in with complaint of back and neck pain. OMT 02/19/2022.  Patient states that her back has been tight but nothing new since last visit.  Patient has been making strides overall.  Patient states that she has been diagnosed recently with porphyria and feels well with diet control she seems to be doing better  Medications patient has been prescribed: None  Taking:         Reviewed prior external information including notes and imaging from previsou exam, outside providers and external EMR if available.   As well as notes that were available from care everywhere and other healthcare systems.  Past medical history, social, surgical and family history all reviewed in electronic medical record.  [name] pertanent information unless stated regarding to the chief complaint.   Past Medical History:  Diagnosis Date   Anxiety    self reported   Chronic back pain 2021   low back/hip; had nerve ablation in lumbar/sacral joint   Depression    self reported   Hypertension    Maxillary sinus cyst    left   Migraine    Multiple thyroid nodules    3; 1.9 cm and 2 cm, 0.6 cm, will see general  surgeon   [name] pertinent past medical history    Ovarian cyst    UTI (urinary tract infection)     Allergies  Allergen Reactions   Tylenol [Acetaminophen] Anaphylaxis   Aspirin Hives    Unknown childhood rxn   Benadryl [Diphenhydramine Hcl] Itching   Buspirone     Other reaction(s): migraines   Citalopram Hydrobromide     Other reaction(s): agitation and dizziness   Dilaudid [Hydromorphone Hcl] Itching    Pill only   Effexor Xr [Venlafaxine]     Other  reaction(s): More depressed (11/2017-neurology)   Penicillins     hives   Prednisone     Swelling, hives   Sulfamethoxazole-Trimethoprim     Other reaction(s): swelling (2017)   Zoloft [Sertraline]     Other reaction(s): nausea and swelling   Latex Rash     Review of Systems:  [name] headache, visual changes, nausea, vomiting, diarrhea, constipation, dizziness, abdominal pain, skin rash, fevers, chills, night sweats, weight loss, swollen lymph nodes, body aches, joint swelling, chest pain, shortness of breath, mood changes. POSITIVE muscle aches  Objective  Blood pressure 110/84, pulse 92, height '5\' 3"'$  (1.6 m), weight 237 lb (107.5 kg), SpO2 99 %, currently breastfeeding.   General: [name] apparent distress alert and oriented x3 mood and affect normal, dressed appropriately.  HEENT: Pupils equal, extraocular movements intact  Respiratory: Patient's speak in full sentences and does not appear short of breath  Cardiovascular: [name] lower extremity edema, non tender, [name] erythema  Neck exam does show some mild loss lordosis.  Patient does have tightness in the parascapular region bilaterally.  Negative Spurling's test.  Patient does have tightness in the low back as well with FABER test bilaterally.  Does lack the last 5 degrees of extension.  Osteopathic findings  C2 flexed rotated and side bent right C5 flexed rotated and side bent left T3 extended rotated and side bent  right inhaled rib T8 extended rotated and side bent left L2 flexed rotated and side bent right Sacrum right on right       Assessment and Plan:  Degenerative disc disease, lumbar Patient states at this point having some difficulty when he comes to the pain still at this moment.  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  Do think that this is a possible cause and would be associated with all of her symptomatology.  Patient given Zanaflex and told to discontinue the  cyclobenzaprine if this is true.  Discussed with patient that he may continue to have difficulty doing when I consider the possibility of a hematology referral.  Patient wants to continue with the conservative therapy otherwise and follow-up with me again in 2 months.   Nonallopathic problems  Decision today to treat with OMT was based on Physical Exam  After verbal consent patient was treated with HVLA, ME, FPR techniques in cervical, rib, thoracic, lumbar, and sacral  areas  Patient tolerated the procedure well with improvement in symptoms  Patient given exercises, stretches and lifestyle modifications  See medications in patient instructions if given  Patient will follow up in 4-8 weeks      The above documentation has been reviewed and is accurate and complete Lyndal Pulley, DO        Note: This dictation was prepared with Dragon dictation along with smaller phrase technology. Any transcriptional errors that result from this process are unintentional.

## 2022-04-16 NOTE — Telephone Encounter (Signed)
Pt lm to get July Botox appt rescheduled. Called pt lm to return my call.

## 2022-04-22 ENCOUNTER — Ambulatory Visit (INDEPENDENT_AMBULATORY_CARE_PROVIDER_SITE_OTHER): Payer: BC Managed Care – PPO | Admitting: Family Medicine

## 2022-04-22 ENCOUNTER — Encounter: Payer: Self-pay | Admitting: Family Medicine

## 2022-04-22 VITALS — BP 110/84 | HR 92 | Ht 63.0 in | Wt 237.0 lb

## 2022-04-22 DIAGNOSIS — M9901 Segmental and somatic dysfunction of cervical region: Secondary | ICD-10-CM

## 2022-04-22 DIAGNOSIS — M9908 Segmental and somatic dysfunction of rib cage: Secondary | ICD-10-CM

## 2022-04-22 DIAGNOSIS — M9902 Segmental and somatic dysfunction of thoracic region: Secondary | ICD-10-CM

## 2022-04-22 DIAGNOSIS — M9903 Segmental and somatic dysfunction of lumbar region: Secondary | ICD-10-CM | POA: Diagnosis not present

## 2022-04-22 DIAGNOSIS — M9904 Segmental and somatic dysfunction of sacral region: Secondary | ICD-10-CM | POA: Diagnosis not present

## 2022-04-22 DIAGNOSIS — M5136 Other intervertebral disc degeneration, lumbar region: Secondary | ICD-10-CM | POA: Diagnosis not present

## 2022-04-22 MED ORDER — TIZANIDINE HCL 2 MG PO TABS: 2.0000 mg | ORAL_TABLET | Freq: Every day | ORAL | 0 refills | Status: DC

## 2022-04-22 NOTE — Patient Instructions (Addendum)
Good to see you Zanaflex '2mg'$  at night See me again in 2 months

## 2022-04-22 NOTE — Assessment & Plan Note (Signed)
Patient states at this point having some difficulty when he comes to the pain still at this moment.  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  Do think that this is a possible cause and would be associated with all of her symptomatology.  Patient given Zanaflex and told to discontinue the cyclobenzaprine if this is true.  Discussed with patient that he may continue to have difficulty doing when I consider the possibility of a hematology referral.  Patient wants to continue with the conservative therapy otherwise and follow-up with me again in 2 months.

## 2022-04-24 ENCOUNTER — Ambulatory Visit
Admission: RE | Admit: 2022-04-24 | Discharge: 2022-04-24 | Disposition: A | Discharge status: Home / Self Care | Payer: BC Managed Care – PPO | Source: Ambulatory Visit | Attending: Gastroenterology | Admitting: Gastroenterology

## 2022-04-24 DIAGNOSIS — R198 Other specified symptoms and signs involving the digestive system and abdomen: Secondary | ICD-10-CM

## 2022-04-24 DIAGNOSIS — K2950 Unspecified chronic gastritis without bleeding: Secondary | ICD-10-CM

## 2022-04-24 DIAGNOSIS — R1084 Generalized abdominal pain: Secondary | ICD-10-CM

## 2022-04-24 IMAGING — CT CT ABD-PELV W/ CM
1 of 2 series · 15 of 32 positions shown, 19 images · IV contrast (agent unspecified)
Comparison: None

CLINICAL DATA: History of constipation and diarrhea.  Gastritis.

EXAM:
CT ABDOMEN AND PELVIS WITH CONTRAST
TECHNIQUE: Multidetector CT imaging of the abdomen and pelvis was performed
using the standard protocol following bolus administration of
intravenous contrast.

[Series 2: a/p w/ 5mm · axial · 0.98mm/px · z∈[-541,-76]mm · 15 of 103 slices shown, 19 images]
[im 5/103  soft-tissue]
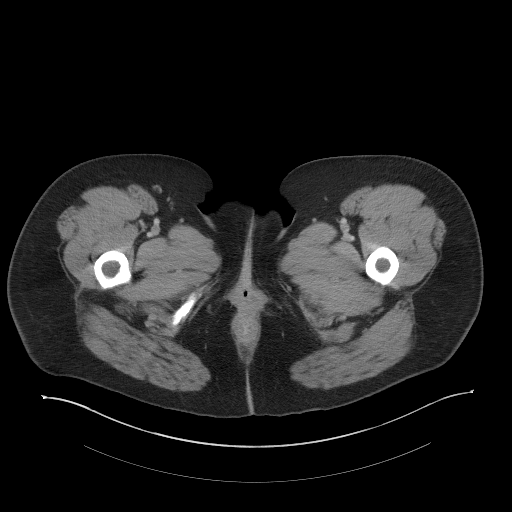
[im 5/103  bone]
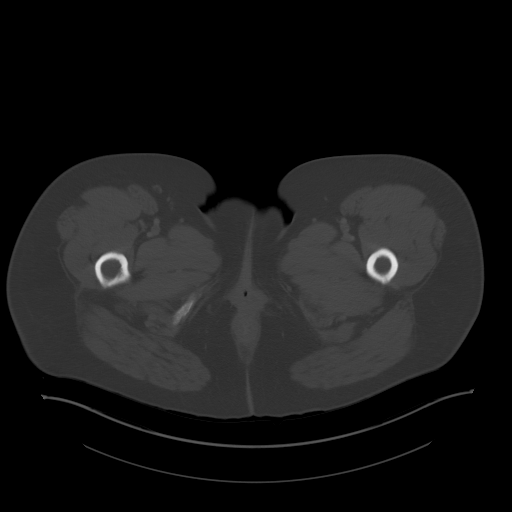
[im 13/103  soft-tissue]
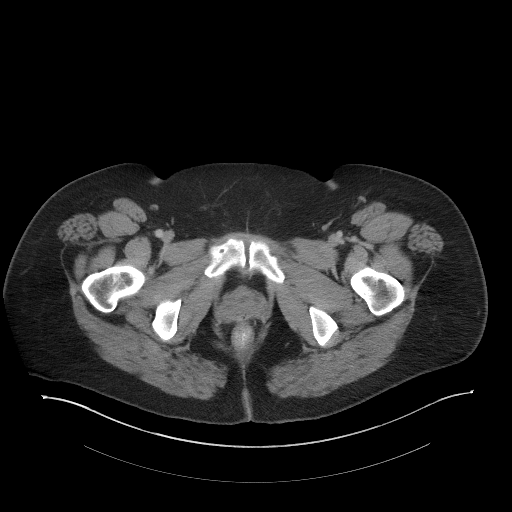
[im 22/103  soft-tissue]
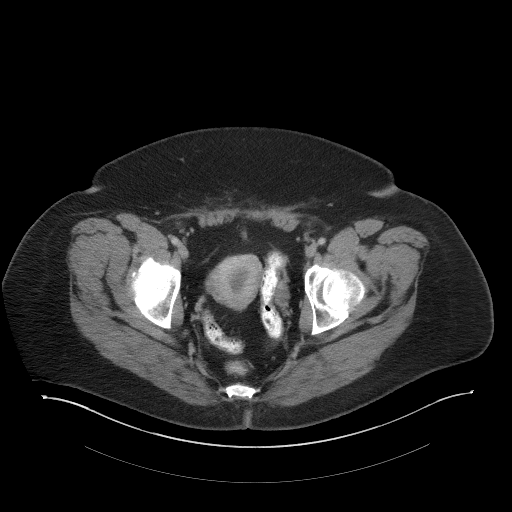
[im 30/103  soft-tissue]
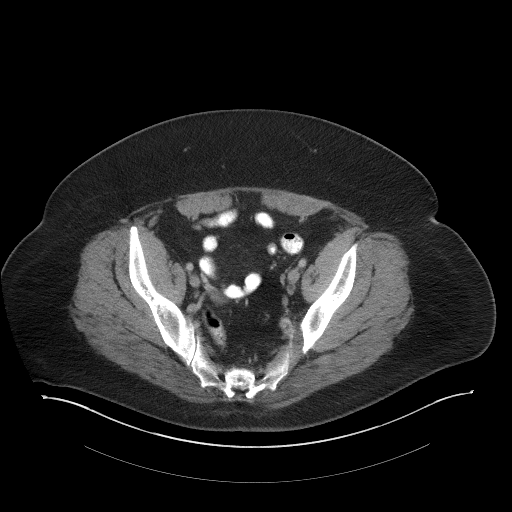
[im 35/103  soft-tissue]
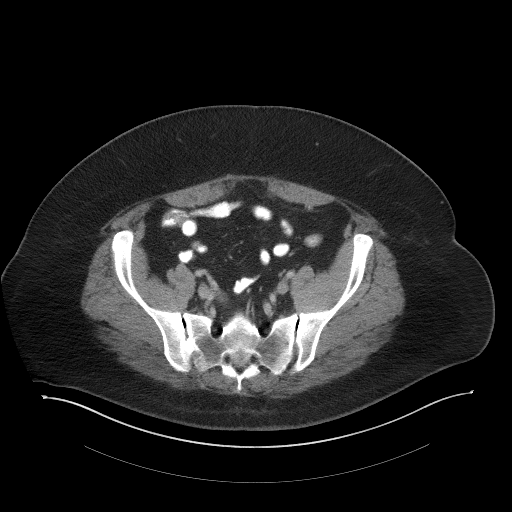
[im 43/103  soft-tissue]
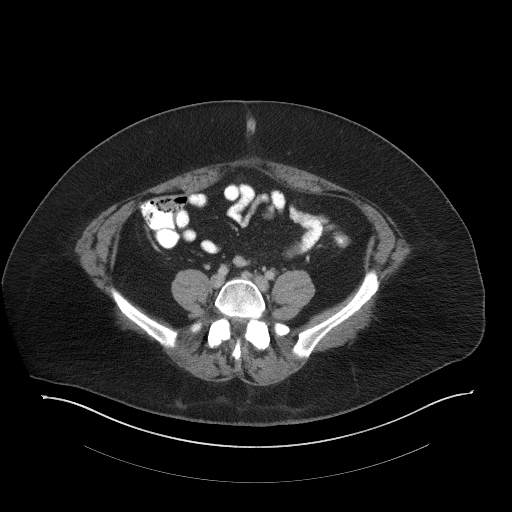
[im 52/103  soft-tissue]
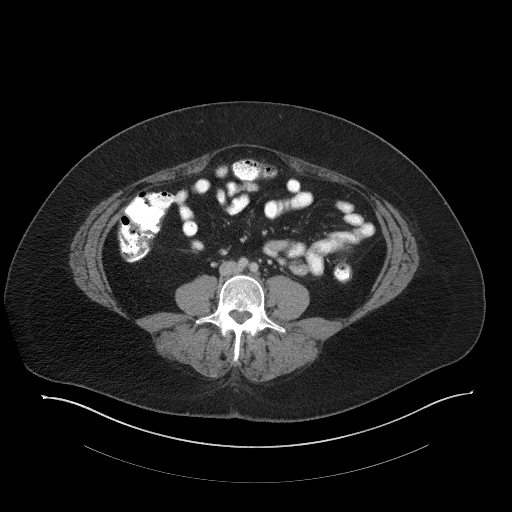
[im 60/103  soft-tissue]
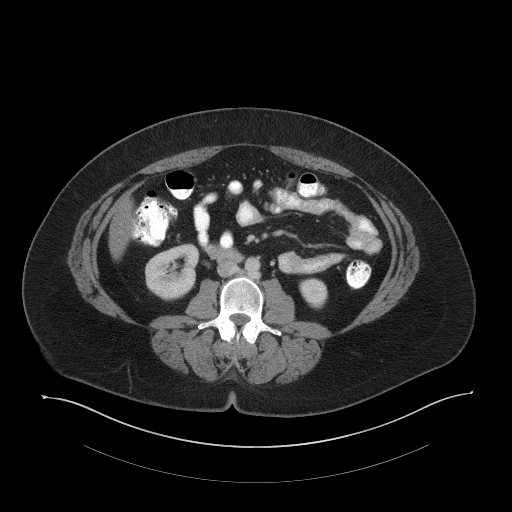
[im 69/103  soft-tissue]
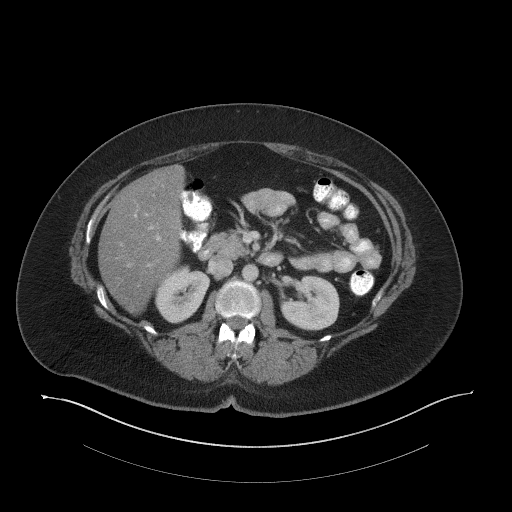
[im 69/103  bone]
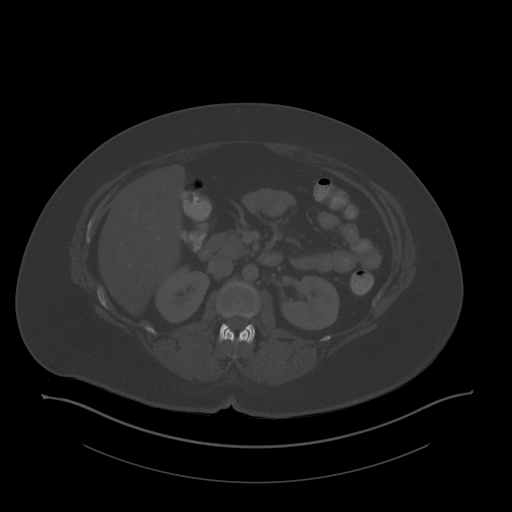
[im 73/103  soft-tissue]
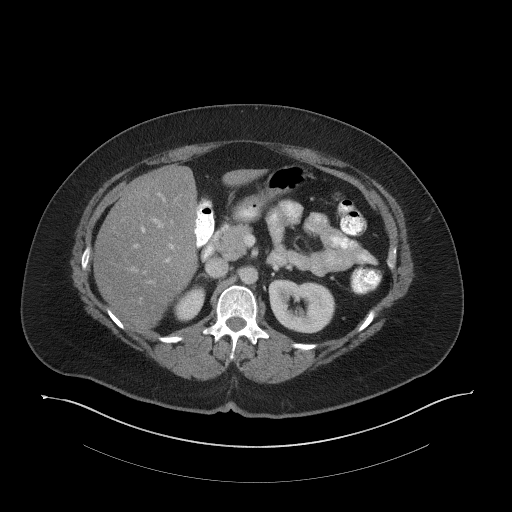
[im 81/103  soft-tissue]
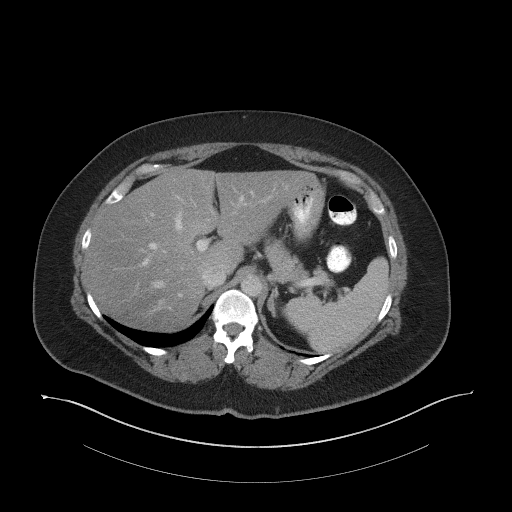
[im 86/103  lung]
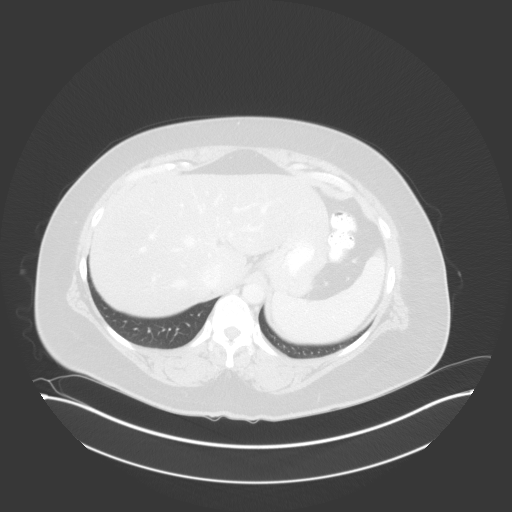
[im 90/103  soft-tissue]
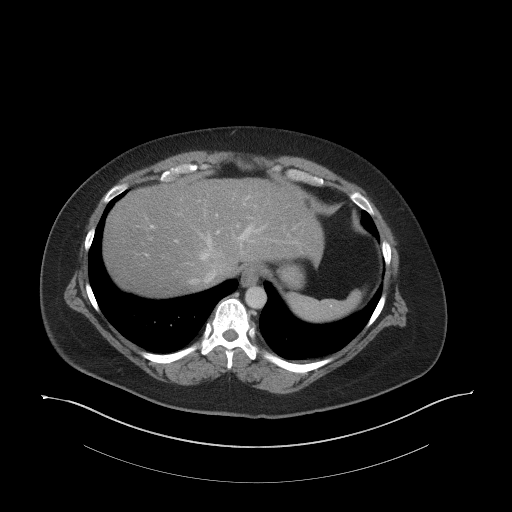
[im 90/103  lung]
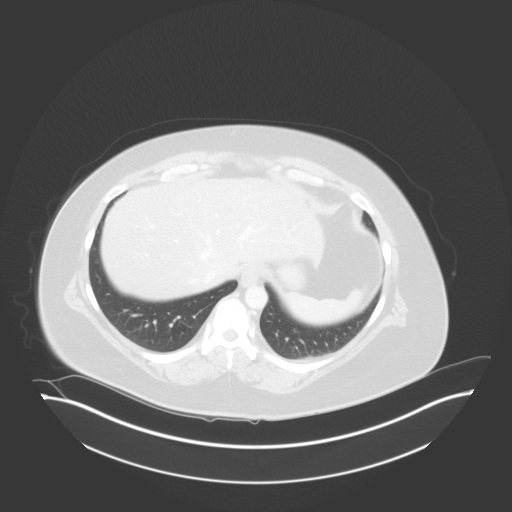
[im 94/103  lung]
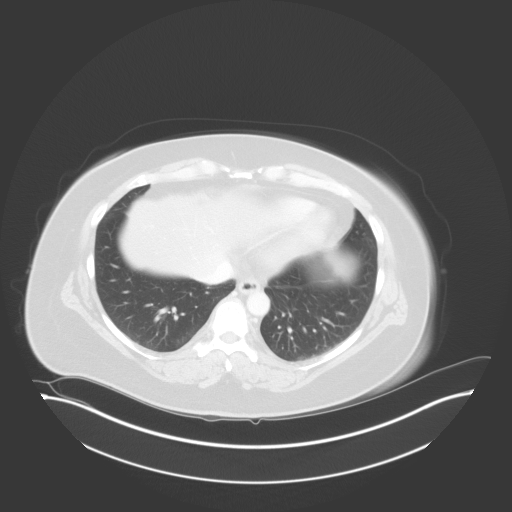
[im 98/103  soft-tissue]
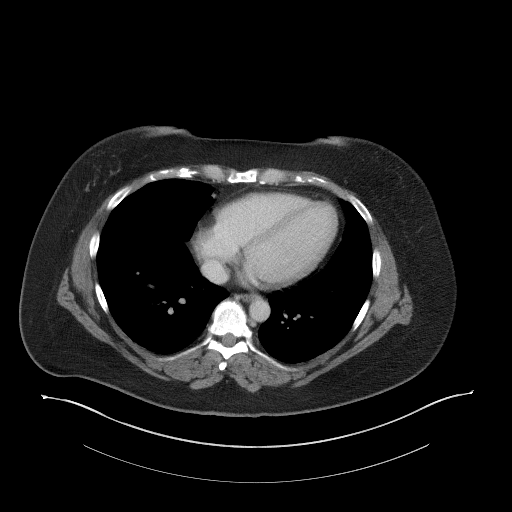
[im 98/103  lung]
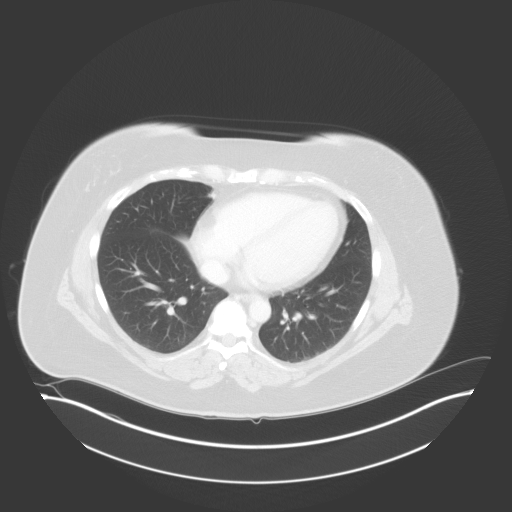

[15 of 32 positions shown; findings below may reference images not displayed]

RADIATION DOSE REDUCTION: This exam was performed according to the
departmental dose-optimization program which includes automated
exposure control, adjustment of the mA and/or kV according to
patient size and/or use of iterative reconstruction technique.

CONTRAST:  100mL [EK] IOPAMIDOL ([EK]) INJECTION 61%
FINDINGS: Lower chest: Normal heart size. Subpleural atelectasis within the
lower lobes bilaterally. No pleural effusion.

Hepatobiliary: Liver is normal in size and contour. No focal lesion
is identified. Gallbladder not visualized. No intrahepatic or
extrahepatic biliary ductal dilatation.

Pancreas: Unremarkable

Spleen: Unremarkable

Adrenals/Urinary Tract: Adrenal glands are normal. Kidneys enhance
symmetrically with contrast. No hydronephrosis. Urinary bladder is
decompressed.

Stomach/Bowel: Oral contrast material to the level of the rectum. No
abnormal bowel wall thickening or evidence for bowel obstruction. No
free fluid or free intraperitoneal air. Normal morphology of the
stomach.

Vascular/Lymphatic: Normal caliber abdominal aorta. Retroaortic left
renal vein. No retroperitoneal lymphadenopathy.

Reproductive: Uterus is unremarkable. Adnexal structures
unremarkable. No pelvic masses.

Other: None.

Musculoskeletal: Lumbar spine degenerative changes. No aggressive or
acute appearing osseous lesions.
IMPRESSION: No acute process within the abdomen or pelvis.

## 2022-04-24 MED ORDER — IOPAMIDOL (ISOVUE-300) INJECTION 61%
100.0000 mL | Freq: Once | INTRAVENOUS | Status: AC | PRN
  Administered 2022-04-24: 100 mL via INTRAVENOUS

## 2022-05-13 NOTE — Telephone Encounter (Signed)
I called to get Botox appt rescheduled, to after August 5th. Pt is now scheduled on 06/30/2022 @ 8:00 am.

## 2022-05-14 ENCOUNTER — Other Ambulatory Visit: Payer: Self-pay | Admitting: Family Medicine

## 2022-05-14 NOTE — Telephone Encounter (Signed)
Received (1) 200 unit vial of Botox from [facility]. 

## 2022-06-09 ENCOUNTER — Ambulatory Visit: Payer: BC Managed Care – PPO | Admitting: Family Medicine

## 2022-06-23 ENCOUNTER — Other Ambulatory Visit: Payer: Self-pay | Admitting: Family Medicine
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
## 2022-06-26 NOTE — Progress Notes (Deleted)
[place] Phone: [phone] Subjective:    I'm seeing this patient by the request  of:  Leeroy Cha, MD  CC:   [id number]  Debbie [name] is a 41 y.o. female coming in with complaint of back and neck pain. OMT 04/22/2022. Patient states   Medications patient has been prescribed: Zanaflex  Taking:         Reviewed prior external information including notes and imaging from previsou exam, outside providers and external EMR if available.   As well as notes that were available from care everywhere and other healthcare systems.  Past medical history, social, surgical and family history all reviewed in electronic medical record.  No pertanent information unless stated regarding to the chief complaint.   Past Medical History:  Diagnosis Date   Anxiety    self reported   Chronic back pain 2021   low back/hip; had nerve ablation in lumbar/sacral joint   Depression    self reported   Hypertension    Maxillary sinus cyst    left   Migraine    Multiple thyroid nodules    3; 1.9 cm and 2 cm, 0.6 cm, will see general  surgeon   No pertinent past medical history    Ovarian cyst    UTI (urinary tract infection)     Allergies  Allergen Reactions   Tylenol [Acetaminophen] Anaphylaxis   Aspirin Hives    Unknown childhood rxn   Benadryl [Diphenhydramine Hcl] Itching   Buspirone     Other reaction(s): migraines   Citalopram Hydrobromide     Other reaction(s): agitation and dizziness   Dilaudid [Hydromorphone Hcl] Itching    Pill only   Effexor Xr [Venlafaxine]     Other reaction(s): More depressed (11/2017-neurology)   Penicillins     hives   Prednisone     Swelling, hives   Sulfamethoxazole-Trimethoprim     Other reaction(s): swelling (2017)   Zoloft [Sertraline]     Other reaction(s): nausea and swelling   Latex Rash     Review of Systems:  No headache, visual changes, nausea,  vomiting, diarrhea, constipation, dizziness, abdominal pain, skin rash, fevers, chills, night sweats, weight loss, swollen lymph nodes, body aches, joint swelling, chest pain, shortness of breath, mood changes. POSITIVE muscle aches  Objective  currently breastfeeding.   General: No apparent distress alert and oriented x3 mood and affect normal, dressed appropriately.  HEENT: Pupils equal, extraocular movements intact  Respiratory: Patient's speak in full sentences and does not appear short of breath  Cardiovascular: No lower extremity edema, non tender, no erythema  Gait MSK:  Back   Osteopathic findings  C2 flexed rotated and side bent right C6 flexed rotated and side bent left T3 extended rotated and side bent right inhaled rib T9 extended rotated and side bent left L2 flexed rotated and side bent right Sacrum right on right       Assessment and Plan:  No problem-specific Assessment & Plan notes found for this encounter.    Nonallopathic problems  Decision today to treat with OMT was based on Physical Exam  After verbal consent patient was treated with HVLA, ME, FPR techniques in cervical, rib, thoracic, lumbar, and sacral  areas  Patient tolerated the procedure well with improvement in symptoms  Patient given exercises, stretches and lifestyle modifications  See medications in patient instructions if given  Patient will follow up in 4-8 weeks  Note: This dictation was prepared with Dragon dictation along with smaller phrase technology. Any transcriptional errors that result from this process are unintentional.

## 2022-06-30 ENCOUNTER — Ambulatory Visit: Payer: BC Managed Care – PPO | Admitting: Family Medicine

## 2022-06-30 VITALS — BP 116/74 | HR 74 | Ht 63.0 in | Wt 237.0 lb

## 2022-06-30 DIAGNOSIS — G43709 Chronic migraine without aura, not intractable, without status migrainosus: Secondary | ICD-10-CM

## 2022-06-30 MED ORDER — ONABOTULINUMTOXINA 200 UNITS IJ SOLR
200.0000 [IU] | Freq: Once | INTRAMUSCULAR | Status: AC
  Administered 2022-06-30: 200 [IU] via INTRAMUSCULAR

## 2022-06-30 NOTE — Progress Notes (Signed)
Botox- 200 units x 1 vial Lot: [id number] Expiration: 12/2024 NDC: 7915-0413-64  Bacteriostatic 0.9% Sodium Chloride- 65m total Lot: [id number]: 07/26/2023 NDC: [address]-47 Dx: GU07.218S/P

## 2022-07-01 ENCOUNTER — Ambulatory Visit: Payer: BC Managed Care – PPO | Admitting: Family Medicine

## 2022-07-12 ENCOUNTER — Other Ambulatory Visit: Payer: Self-pay | Admitting: Family Medicine

## 2022-07-15 NOTE — Progress Notes (Unsigned)
[place] Phone: [phone] Subjective:   [name], am serving as a scribe for Dr. Hulan Saas.  I'm seeing this patient by the request  of:  Leeroy Cha, MD  CC: Neck and back pain follow-up  [id number]  Debbie [name] is a 41 y.o. female coming in with complaint of back and neck pain. OMT 07/13/2022. Patient states that she went to [place] and had cold laser therapy which helped her back. No longer having pain with flexion and radicular symptoms have dissipated.   Medications patient has been prescribed: Zanaflex  Taking: Not taking it because she is breast-feeding         Reviewed prior external information including notes and imaging from previsou exam, outside providers and external EMR if available.   As well as notes that were available from care everywhere and other healthcare systems.  Past medical history, social, surgical and family history all reviewed in electronic medical record.  No pertanent information unless stated regarding to the chief complaint.   Past Medical History:  Diagnosis Date   Anxiety    self reported   Chronic back pain 2021   low back/hip; had nerve ablation in lumbar/sacral joint   Depression    self reported   Hypertension    Maxillary sinus cyst    left   Migraine    Multiple thyroid nodules    3; 1.9 cm and 2 cm, 0.6 cm, will see general  surgeon   No pertinent past medical history    Ovarian cyst    UTI (urinary tract infection)     Allergies  Allergen Reactions   Tylenol [Acetaminophen] Anaphylaxis   Aspirin Hives    Unknown childhood rxn   Benadryl [Diphenhydramine Hcl] Itching   Buspirone     Other reaction(s): migraines   Citalopram Hydrobromide     Other reaction(s): agitation and dizziness   Dilaudid [Hydromorphone Hcl] Itching    Pill only   Effexor Xr [Venlafaxine]     Other reaction(s): More depressed (11/2017-neurology)    Penicillins     hives   Prednisone     Swelling, hives   Sulfamethoxazole-Trimethoprim     Other reaction(s): swelling (2017)   Zoloft [Sertraline]     Other reaction(s): nausea and swelling   Latex Rash     Review of Systems:  No headache, visual changes, nausea, vomiting, diarrhea, constipation, dizziness, abdominal pain, skin rash, fevers, chills, night sweats, weight loss, swollen lymph nodes, joint swelling, chest pain, shortness of breath, mood changes. POSITIVE muscle aches, body aches  Objective  Blood pressure 108/84, pulse 82, height '5\' 3"'$  (1.6 m), weight 237 lb (107.5 kg), SpO2 98 %, currently breastfeeding.   General: No apparent distress alert and oriented x3 mood and affect normal, dressed appropriately.  HEENT: Pupils equal, extraocular movements intact  Respiratory: Patient's speak in full sentences and does not appear short of breath  Cardiovascular: No lower extremity edema, non tender, no erythema  Gait MSK:  Back does have some loss of lordosis.  Patient has had an improvement though in range of motion from previous exam.  Patient is still tender to palpation mostly in the thoracolumbar and lumbosacral areas.  Osteopathic findings  T5 extended rotated and side bent left L5 flexed rotated and side bent left Sacrum right on right       Assessment and Plan:  Degenerative disc disease, lumbar Known arthritic changes noted.  Patient though  is responding well to this osteopathic manipulation as well as cold laser therapy at the moment.  Discussed different medications such as the gabapentin still.  Patient is breast-feeding still and also has been holding on the muscle relaxer which we did discuss now.  Follow-up with me again in 6 to 8 weeks otherwise.    Nonallopathic problems  Decision today to treat with OMT was based on Physical Exam  After verbal consent patient was treated with HVLA, ME, FPR techniques in  thoracic, lumbar, and sacral   areas  Patient tolerated the procedure well with improvement in symptoms  Patient given exercises, stretches and lifestyle modifications  See medications in patient instructions if given  Patient will follow up in 4-8 weeks      The above documentation has been reviewed and is accurate and complete Lyndal Pulley, DO        Note: This dictation was prepared with Dragon dictation along with smaller phrase technology. Any transcriptional errors that result from this process are unintentional.

## 2022-07-16 ENCOUNTER — Ambulatory Visit (INDEPENDENT_AMBULATORY_CARE_PROVIDER_SITE_OTHER): Payer: BC Managed Care – PPO | Admitting: Family Medicine

## 2022-07-16 ENCOUNTER — Encounter: Payer: Self-pay | Admitting: Family Medicine

## 2022-07-16 VITALS — BP 108/84 | HR 82 | Ht 63.0 in | Wt 237.0 lb

## 2022-07-16 DIAGNOSIS — M9903 Segmental and somatic dysfunction of lumbar region: Secondary | ICD-10-CM

## 2022-07-16 DIAGNOSIS — M9902 Segmental and somatic dysfunction of thoracic region: Secondary | ICD-10-CM

## 2022-07-16 DIAGNOSIS — M9904 Segmental and somatic dysfunction of sacral region: Secondary | ICD-10-CM

## 2022-07-16 DIAGNOSIS — M5136 Other intervertebral disc degeneration, lumbar region: Secondary | ICD-10-CM

## 2022-07-16 NOTE — Patient Instructions (Signed)
Good to see you See if laser helps See me in 2 months

## 2022-07-16 NOTE — Assessment & Plan Note (Signed)
Known arthritic changes noted.  Patient though is responding well to this osteopathic manipulation as well as cold laser therapy at the moment.  Discussed different medications such as the gabapentin still.  Patient is breast-feeding still and also has been holding on the muscle relaxer which we did discuss now.  Follow-up with me again in 6 to 8 weeks otherwise.

## 2022-08-12 ENCOUNTER — Ambulatory Visit: Payer: BC Managed Care – PPO | Attending: Cardiology | Admitting: Cardiology

## 2022-08-12 ENCOUNTER — Encounter: Payer: Self-pay | Admitting: Cardiology

## 2022-08-12 VITALS — BP 122/86 | HR 68 | Ht 63.0 in | Wt 229.4 lb

## 2022-08-12 DIAGNOSIS — O1413 Severe pre-eclampsia, third trimester: Secondary | ICD-10-CM

## 2022-08-12 DIAGNOSIS — I2510 Atherosclerotic heart disease of native coronary artery without angina pectoris: Secondary | ICD-10-CM | POA: Diagnosis not present

## 2022-08-12 DIAGNOSIS — E781 Pure hyperglyceridemia: Secondary | ICD-10-CM | POA: Diagnosis not present

## 2022-08-12 NOTE — Progress Notes (Signed)
Cardio-Obstetrics Clinic  Follow Up Note   Date:  08/12/2022   ID:  Debbie [name], DOB 03-Oct-1981, MRN [id number]  PCP:  Leeroy Cha, [place] Providers Cardiologist:  Berniece Salines, DO  Electrophysiologist:  None        Referring MD: Leeroy Cha,*   Chief Complaint: " I am planning the surgery"  History of Present Illness:    Debbie [name] is a 41 y.o. female [[id number]] who returns for follow up of severe preeclampsia here today for follow-up visit.  I last saw the patient on February 04, 2019.  At that time she was postpartum.  She was doing well postpartum .  We continue to monitor the patient closely because of her severe preeclampsia.  Today she tells me that her blood pressure has not been elevated.  But she has been experiencing intermittent chest discomfort.  She described as a burning sensation which started midsternal and then diffuse up to her next.  Sometimes she feels as if she is choking.  She has been started on pantoprazole.  She is concerned this is not really helping.  They are suspecting GI stable and that she may have some subtle stricture and therefore she has been planned for an endoscopy.   Prior CV Studies Reviewed: The following studies were reviewed today: Zio monitor Patch Wear Time:  13 days and 15 hours September 16, 2021 Indication: Palpitation   Patient had a minimum HR of 61 bpm, maximum HR of 147 bpm, and average HR of 89 bpm.   Predominant underlying rhythm was Sinus Rhythm.   Premature atrial complexes were rare (<1.0%). Premature ventricular complexes were rare (<1.0%).   Symptoms associated with sinus rhythm.   Conclusion: Normal/unremarkable study with no evidence of significant arrhythmia.   TTE 09/20/2021 IMPRESSIONS     1. Left ventricular ejection fraction, by estimation, is 60 to 65%. The  left ventricle has normal function. The left ventricle has no regional  wall motion abnormalities. Left  ventricular diastolic parameters were  normal. The average left ventricular  global longitudinal strain is -22.7 %. The global longitudinal strain is  normal.   2. Right ventricular systolic function is normal. The right ventricular  size is normal.   3. The mitral valve is normal in structure. No evidence of mitral valve  regurgitation. No evidence of mitral stenosis.   4. The aortic valve is normal in structure. Aortic valve regurgitation is  not visualized. No aortic stenosis is present.   5. The inferior vena cava is normal in size with greater than 50%  respiratory variability, suggesting right atrial pressure of 3 mmHg.   Comparison(s): No prior Echocardiogram   Past Medical History:  Diagnosis Date   Anxiety    self reported   Chronic back pain 2021   low back/hip; had nerve ablation in lumbar/sacral joint   Depression    self reported   Hypertension    Maxillary sinus cyst    left   Migraine    Multiple thyroid nodules    3; 1.9 cm and 2 cm, 0.6 cm, will see general  surgeon   No pertinent past medical history    Ovarian cyst    UTI (urinary tract infection)     Past Surgical History:  Procedure Laterality Date   CESAREAN SECTION  2013   CESAREAN SECTION N/A 11/26/2021   Procedure: REPEAT CESAREAN SECTION;  Surgeon: Drema Dallas, DO;  Location: [facility] LD ORS;  Service: Obstetrics;  Laterality:  N/A;   laparscopy  2012   ruptured ectopic   radiofrequency ablation of lumbar spine N/A 08/07/2020   SHOULDER CAPSULORRHAPHY Right       OB History     Gravida  3   Para  2   Term  1   Preterm  1   AB  1   Living  2      SAB  0   IAB      Ectopic  1   Multiple  0   Live Births  2        Obstetric Comments  1-breech             Current Medications: Current Meds  Medication Sig   Botulinum Toxin Type A (BOTOX) 200 units SOLR Inject 155 Units as directed every 3 (three) months. Inject 155units to head and neck intramuscularly every 3  months.   loratadine (CLARITIN) 10 MG tablet Take 10 mg by mouth daily as needed for allergies.   pantoprazole (PROTONIX) 40 MG tablet Take 40 mg by mouth daily.   prochlorperazine (COMPAZINE) 10 MG tablet Take 1 tablet (10 mg total) by mouth every 8 (eight) hours as needed for nausea or vomiting (headache).   SUMAtriptan (IMITREX) 6 MG/0.5ML SOSY injection Inject 0.5 mLs (6 mg total) into the skin every 2 (two) hours as needed for migraine or headache.     Allergies:   Tylenol [acetaminophen], Aspirin, Benadryl [diphenhydramine hcl], Buspirone, Citalopram hydrobromide, Dilaudid [hydromorphone hcl], Effexor xr [venlafaxine], Penicillins, Prednisone, Sulfamethoxazole-trimethoprim, Zoloft [sertraline], and Latex   Social History   Socioeconomic History   Marital status: Married    Spouse name: Not on file   Number of children: 1   Years of education: Not on file   Highest education level: Bachelor's degree (e.g., BA, AB, BS)  Occupational History   Not on file  Tobacco Use   Smoking status: Never   Smokeless tobacco: Never  Vaping Use   Vaping Use: Never used  Substance and Sexual Activity   Alcohol use: Not Currently   Drug use: No   Sexual activity: Not Currently    Birth control/protection: None  Other Topics Concern   Not on file  Social History Narrative   Lives at home with her son & his father   Right handed   Drinks 1 cup of caffeine daily   Social Determinants of Health   Financial Resource Strain: Not on file  Food Insecurity: No Food Insecurity (11/05/2021)   Hunger Vital Sign    Worried About Running Out of Food in the Last Year: Never true    [place] in the Last Year: Never true  Transportation Needs: Unknown (11/05/2021)   PRAPARE - Hydrologist (Medical): No    Lack of Transportation (Non-Medical): Not on file  Physical Activity: Not on file  Stress: Not on file  Social Connections: Not on file      Family History   Problem Relation Age of Onset   Hypertension Mother    Heart disease Father    Diabetes Father    Dementia Father    Cerebral aneurysm Sister    Migraines Sister    Seizures Sister    Heart disease Maternal Grandfather    Heart disease Other        mother's side   High Cholesterol Other        mother's side       ROS:  Please see the history of present illness.    Chest discomfort but All other systems reviewed and are negative.   Labs/EKG Reviewed:    EKG:   EKG is was not ordered today.    Recent Labs: 11/27/2021: ALT 15; BUN <5; Creatinine, Ser 0.72; Hemoglobin 13.1; Magnesium 4.4; Platelets 259; Potassium 4.3; Sodium 129   Recent Lipid Panel Lab Results  Component Value Date/Time   CHOL 219 (H) 04/03/2022 07:46 AM   TRIG 234 (H) 04/03/2022 07:46 AM   HDL 38 (L) 04/03/2022 07:46 AM   CHOLHDL 5.8 (H) 04/03/2022 07:46 AM   LDLCALC 139 (H) 04/03/2022 07:46 AM    Physical Exam:    VS:  BP 122/86   Pulse 68   Ht '5\' 3"'$  (1.6 m)   Wt 229 lb 6.4 oz (104.1 kg)   SpO2 98%   Breastfeeding Yes   BMI 40.64 kg/m     Wt Readings from Last 3 Encounters:  08/12/22 229 lb 6.4 oz (104.1 kg)  07/16/22 237 lb (107.5 kg)  06/30/22 237 lb (107.5 kg)     GEN:  Well nourished, well developed in no acute distress HEENT: Normal NECK: No JVD; No carotid bruits LYMPHATICS: No lymphadenopathy CARDIAC: RRR, no murmurs, rubs, gallops RESPIRATORY:  Clear to auscultation without rales, wheezing or rhonchi  ABDOMEN: Soft, non-tender, non-distended MUSCULOSKELETAL:  No edema; No deformity  SKIN: Warm and dry NEUROLOGIC:  Alert and oriented x 3 PSYCHIATRIC:  Normal affect    Risk Assessment/Risk Calculators:                 ASSESSMENT & PLAN:    History of preeclampsia Hypertriglyceridemia Obesity Chest discomfort/atypical  Her blood pressure is at target this is related.  She did have lipid profile which was done in May 2023 which shows total cholesterol 219, HDL  38, LDL 139 and triglycerides 234.  She is still breast-feeding. In terms of her symptoms that she describes though she has been treated for GERD as well as getting her EGD.  I am going to watch the patient slowly making sure that this may not be atypical symptoms.  Should the symptoms persist we will pursue  a coronary CTA. The patient understands the need to lose weight with diet and exercise. We have discussed specific strategies for this.  Patient Instructions  Medication Instructions:  Your physician recommends that you continue on your current medications as directed. Please refer to the Current Medication list given to you today.  *If you need a refill on your cardiac medications before your next appointment, please call your pharmacy*   Lab Work: BEFORE NEXT APPT:  Lipids, Lp(a) - please come fasting If you have labs (blood work) drawn today and your tests are completely normal, you will receive your results only by: [place] (if you have MyChart) OR A paper copy in the mail If you have any lab test that is abnormal or we need to change your treatment, we will call you to review the results.   Testing/Procedures: None   Follow-Up: At [facility] [place], you and your health needs are our priority.  As part of our continuing mission to provide you with exceptional heart care, we have created designated Provider Care Teams.  These Care Teams include your primary Cardiologist (physician) and Advanced Practice Providers (APPs -  Physician Assistants and Nurse Practitioners) who all work together to provide you with the care you need, when you need it.  We recommend signing up  for the patient portal called "MyChart".  Sign up information is provided on this After Visit Summary.  MyChart is used to connect with patients for Virtual Visits (Telemedicine).  Patients are able to view lab/test results, encounter notes, upcoming appointments, etc.  Non-urgent messages can be sent to  your provider as well.   To learn more about what you can do with MyChart, go to [email or web address].    Your next appointment:   6 month(s)  The format for your next appointment:   In Person  Provider:   Berniece Salines, DO     Other Instructions   Important Information About Sugar         Dispo:  Return in about 6 months (around 02/10/2023).   Medication Adjustments/Labs and Tests Ordered: Current medicines are reviewed at length with the patient today.  Concerns regarding medicines are outlined above.  Tests Ordered: Orders Placed This Encounter  Procedures   Lipid panel   Lipoprotein A (LPA)   Medication Changes: No orders of the defined types were placed in this encounter.

## 2022-08-12 NOTE — Patient Instructions (Addendum)
Medication Instructions:  Your physician recommends that you continue on your current medications as directed. Please refer to the Current Medication list given to you today.  *If you need a refill on your cardiac medications before your next appointment, please call your pharmacy*   Lab Work: BEFORE NEXT APPT:  Lipids, Lp(a) - please come fasting If you have labs (blood work) drawn today and your tests are completely normal, you will receive your results only by: [place] (if you have MyChart) OR A paper copy in the mail If you have any lab test that is abnormal or we need to change your treatment, we will call you to review the results.   Testing/Procedures: None   Follow-Up: At [facility], you and your health needs are our priority.  As part of our continuing mission to provide you with exceptional heart care, we have created designated Provider Care Teams.  These Care Teams include your primary Cardiologist (physician) and Advanced Practice Providers (APPs -  Physician Assistants and Nurse Practitioners) who all work together to provide you with the care you need, when you need it.  We recommend signing up for the patient portal called "MyChart".  Sign up information is provided on this After Visit Summary.  MyChart is used to connect with patients for Virtual Visits (Telemedicine).  Patients are able to view lab/test results, encounter notes, upcoming appointments, etc.  Non-urgent messages can be sent to your provider as well.   To learn more about what you can do with MyChart, go to [email or web address].    Your next appointment:   6 month(s)  The format for your next appointment:   In Person  Provider:   Berniece Salines, DO     Other Instructions   Important Information About Sugar

## 2022-08-19 ENCOUNTER — Other Ambulatory Visit: Payer: Self-pay | Admitting: Surgery

## 2022-08-19 ENCOUNTER — Encounter (HOSPITAL_COMMUNITY): Payer: Self-pay | Admitting: Gastroenterology

## 2022-08-19 DIAGNOSIS — E042 Nontoxic multinodular goiter: Secondary | ICD-10-CM

## 2022-08-19 NOTE — Progress Notes (Signed)
Chart sent to [place], [place] to review. Gave patient phone number to call for medication rec.

## 2022-08-26 ENCOUNTER — Ambulatory Visit
Admission: RE | Admit: 2022-08-26 | Discharge: 2022-08-26 | Disposition: A | Discharge status: Home / Self Care | Payer: BC Managed Care – PPO | Source: Ambulatory Visit | Attending: Surgery | Admitting: Surgery

## 2022-08-26 DIAGNOSIS — E042 Nontoxic multinodular goiter: Secondary | ICD-10-CM

## 2022-08-27 ENCOUNTER — Ambulatory Visit (HOSPITAL_COMMUNITY)
Admission: RE | Admit: 2022-08-27 | Discharge: 2022-08-27 | Disposition: A | Discharge status: Home / Self Care | Payer: BC Managed Care – PPO | Attending: Gastroenterology | Admitting: Gastroenterology

## 2022-08-27 ENCOUNTER — Encounter (HOSPITAL_COMMUNITY)
Admission: RE | Disposition: A | Discharge status: Home / Self Care | Payer: Self-pay | Source: Home / Self Care | Attending: Gastroenterology

## 2022-08-27 ENCOUNTER — Encounter (HOSPITAL_COMMUNITY): Payer: Self-pay | Admitting: Gastroenterology

## 2022-08-27 ENCOUNTER — Ambulatory Visit (HOSPITAL_COMMUNITY): Payer: BC Managed Care – PPO | Admitting: Physician Assistant

## 2022-08-27 DIAGNOSIS — D130 Benign neoplasm of esophagus: Secondary | ICD-10-CM | POA: Diagnosis not present

## 2022-08-27 DIAGNOSIS — G43909 Migraine, unspecified, not intractable, without status migrainosus: Secondary | ICD-10-CM | POA: Insufficient documentation

## 2022-08-27 DIAGNOSIS — K2970 Gastritis, unspecified, without bleeding: Secondary | ICD-10-CM | POA: Insufficient documentation

## 2022-08-27 DIAGNOSIS — Z6841 Body Mass Index (BMI) 40.0 and over, adult: Secondary | ICD-10-CM | POA: Diagnosis not present

## 2022-08-27 DIAGNOSIS — K2289 Other specified disease of esophagus: Secondary | ICD-10-CM | POA: Diagnosis not present

## 2022-08-27 DIAGNOSIS — R1084 Generalized abdominal pain: Secondary | ICD-10-CM | POA: Diagnosis present

## 2022-08-27 DIAGNOSIS — R1310 Dysphagia, unspecified: Secondary | ICD-10-CM | POA: Diagnosis not present

## 2022-08-27 HISTORY — PX: ESOPHAGOGASTRODUODENOSCOPY: SHX5428

## 2022-08-27 HISTORY — DX: Unspecified osteoarthritis, unspecified site: M19.90

## 2022-08-27 HISTORY — PX: BIOPSY: SHX5522

## 2022-08-27 LAB — PREGNANCY, URINE: Preg Test, Ur: NEGATIVE

## 2022-08-27 SURGERY — EGD (ESOPHAGOGASTRODUODENOSCOPY)
Anesthesia: Monitor Anesthesia Care

## 2022-08-27 MED ORDER — PROPOFOL 500 MG/50ML IV EMUL
INTRAVENOUS | Status: AC
  Filled 2022-08-27: qty 50

## 2022-08-27 MED ORDER — LACTATED RINGERS IV SOLN
INTRAVENOUS | Status: DC
  Administered 2022-08-27: 1000 mL via INTRAVENOUS

## 2022-08-27 MED ORDER — ONDANSETRON HCL 4 MG/2ML IJ SOLN
INTRAMUSCULAR | Status: DC | PRN
  Administered 2022-08-27: 4 mg via INTRAVENOUS

## 2022-08-27 MED ORDER — LIDOCAINE 2% (20 MG/ML) 5 ML SYRINGE
INTRAMUSCULAR | Status: DC | PRN
  Administered 2022-08-27: 100 mg via INTRAVENOUS

## 2022-08-27 MED ORDER — PROPOFOL 10 MG/ML IV BOLUS
INTRAVENOUS | Status: DC | PRN
  Administered 2022-08-27: 10 mg via INTRAVENOUS
  Administered 2022-08-27: 20 mg via INTRAVENOUS
  Administered 2022-08-27: 10 mg via INTRAVENOUS
  Administered 2022-08-27: 20 mg via INTRAVENOUS

## 2022-08-27 MED ORDER — PROPOFOL 500 MG/50ML IV EMUL
INTRAVENOUS | Status: DC | PRN
  Administered 2022-08-27: 125 ug/kg/min via INTRAVENOUS

## 2022-08-27 NOTE — Discharge Instructions (Signed)
Endoscopy °Care After °Please read the instructions outlined below and refer to this sheet in the next few weeks. These discharge instructions provide you with general information on caring for yourself after you leave the hospital. Your doctor may also give you specific instructions. While your treatment has been planned according to the most current medical practices available, unavoidable complications occasionally occur. If you have any problems or questions after discharge, please call Dr. Kodee Drury ([facility]) at [phone]. ° °HOME CARE INSTRUCTIONS °Activity °· You may resume your regular activity but move at a slower pace for the next 24 hours.  °· Take frequent rest periods for the next 24 hours.  °· Walking will help expel (get rid of) the air and reduce the bloated feeling in your abdomen.  °· No driving for 24 hours (because of the anesthesia (medicine) used during the test).  °· You may shower.  °· Do not sign any important legal documents or operate any machinery for 24 hours (because of the anesthesia used during the test).  °Nutrition °· Drink plenty of fluids.  °· You may resume your normal diet.  °· Begin with a light meal and progress to your normal diet.  °· Avoid alcoholic beverages for 24 hours or as instructed by your caregiver.  °Medications °You may resume your normal medications unless your caregiver tells you otherwise. °What you can expect today °· You may experience abdominal discomfort such as a feeling of fullness or "gas" pains.  °· You may experience a sore throat for 2 to 3 days. This is normal. Gargling with salt water may help this.  °·  °SEEK IMMEDIATE MEDICAL CARE IF: °· You have excessive nausea (feeling sick to your stomach) and/or vomiting.  °· You have severe abdominal pain and distention (swelling).  °· You have trouble swallowing.  °· You have a temperature over 100° F (37.8° C).  °· You have rectal bleeding or vomiting of blood.  °Document Released:  06/24/2004 Document Revised: 07/23/2011 Document Reviewed: 01/05/2008 °ExitCare® Patient Information ©2012 [employer]. °

## 2022-08-27 NOTE — Transfer of Care (Signed)
Immediate Anesthesia Transfer of Care Note  Patient: Debbie [name]  Procedure(s) Performed: ESOPHAGOGASTRODUODENOSCOPY (EGD) BIOPSY  Patient Location: PACU and Endoscopy Unit  Anesthesia Type:MAC  Level of Consciousness: awake, alert  and oriented  Airway & Oxygen Therapy: Face mask, spontaneous respirations  Post-op Assessment: Report given to RN and Post -op Vital signs reviewed and stable  Post vital signs: Reviewed and stable  Last Vitals:  Vitals Value Taken Time  BP    Temp    Pulse    Resp    SpO2      Last Pain:  Vitals:   08/27/22 0826  TempSrc: Temporal  PainSc: 3          Complications: No notable events documented.

## 2022-08-27 NOTE — Op Note (Signed)
[facility] Patient Name: Debbie [name] Procedure Date: 08/27/2022 MRN: [id number] Attending MD: Arta Silence , MD Date of Birth: January 29, 1981 CSN: [id number] Age: 41 Admit Type: Outpatient Procedure:                Upper GI endoscopy Indications:              Generalized abdominal pain, Dysphagia Providers:                Arta Silence, MD, Velva Harman, RN, Jayanna Kroeger Dalton, Technician Referring MD:              Medicines:                Monitored Anesthesia Care Complications:            No immediate complications. Estimated Blood Loss:     Estimated blood loss: none. Procedure:                Pre-Anesthesia Assessment:                           - Prior to the procedure, a History and Physical                            was performed, and patient medications and                            allergies were reviewed. The patient's tolerance of                            previous anesthesia was also reviewed. The risks                            and benefits of the procedure and the sedation                            options and risks were discussed with the patient.                            All questions were answered, and informed consent                            was obtained. Prior Anticoagulants: The patient has                            taken no previous anticoagulant or antiplatelet                            agents. ASA Grade Assessment: II - A patient with                            mild systemic disease. After reviewing the risks  and benefits, the patient was deemed in                            satisfactory condition to undergo the procedure.                           After obtaining informed consent, the endoscope was                            passed under direct vision. Throughout the                            procedure, the patient's blood pressure, pulse, and                            oxygen  saturations were monitored continuously. The                            [id number]-[id number] ([id number]) Olympus endoscope was introduced                            through the mouth, and advanced to the second part                            of duodenum. The upper GI endoscopy was                            accomplished without difficulty. The patient                            tolerated the procedure well. Scope In: Scope Out: Findings:      A single 3 mm mucosal nodule with a localized distribution was found in       the upper third of the esophagus. The polyp was removed with a cold       biopsy forceps. Resection and retrieval were complete.      The examined esophagus was normal; no esophageal stricture, mass, ring       identified. Biopsies were obtained from the proximal and distal       esophagus with cold forceps for histology of suspected eosinophilic       esophagitis.      Patchy mild inflammation was found in the entire examined stomach.       Biopsies were taken with a cold forceps for histology.      The exam of the stomach was otherwise normal.      The duodenal bulb, first portion of the duodenum and second portion of       the duodenum were normal. Impression:               - Mucosal nodule found in the esophagus. Removed.                            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                           - Normal esophagus.  Biopsied.                           - Gastritis. Biopsied.                           - Normal duodenal bulb, first portion of the                            duodenum and second portion of the duodenum. Moderate Sedation:      Not Applicable - Patient had care per Anesthesia. Recommendation:           - Patient has a contact number available for                            emergencies. The signs and symptoms of potential                            delayed complications were discussed with the                            patient. Return to normal activities  tomorrow.                            Written discharge instructions were provided to the                            patient.                           - Discharge patient to home (via wheelchair).                           - Resume previous diet today.                           - Continue present medications.                           - Await pathology results.                           - Return to GI clinic after studies are complete.                           - Return to referring physician as previously                            scheduled. Procedure Code(s):        --- Professional ---                           [phone], Esophagogastroduodenoscopy, flexible,                            transoral; with biopsy, single or multiple Diagnosis Code(s):        --- Professional ---  K22.8, Other specified diseases of esophagus                           K29.70, Gastritis, unspecified, without bleeding                           R10.84, Generalized abdominal pain                           R13.10, Dysphagia, unspecified CPT copyright 2019 [employer] Association. All rights reserved. The codes documented in this report are preliminary and upon coder review may  be revised to meet current compliance requirements. Arta Silence, MD 08/27/2022 10:11:26 AM This report has been signed electronically. Number of Addenda: 0

## 2022-08-27 NOTE — H&P (Signed)
[facility] H/P Note  Chief Complaint: dysphagia, abdominal pain  HPI: Debbie [name] is an 41 y.o. female.  Presents with 1 year intermittent solid predominant dysphagia.  Generalized mild abdominal pain.  Past Medical History:  Diagnosis Date   Anxiety    self reported   Arthritis    in spine   Chronic back pain 2021   low back/hip; had nerve ablation in lumbar/sacral joint   Depression    self reported   Hypertension    Maxillary sinus cyst    left   Migraine    Multiple thyroid nodules    3; 1.9 cm and 2 cm, 0.6 cm, will see general  surgeon   No pertinent past medical history    Ovarian cyst    UTI (urinary tract infection)     Past Surgical History:  Procedure Laterality Date   CESAREAN SECTION  2013   CESAREAN SECTION N/A 11/26/2021   Procedure: REPEAT CESAREAN SECTION;  Surgeon: Drema Dallas, DO;  Location: [facility] LD ORS;  Service: Obstetrics;  Laterality: N/A;   laparscopy  2012   ruptured ectopic   radiofrequency ablation of lumbar spine N/A 08/07/2020   SHOULDER CAPSULORRHAPHY Right    WISDOM TOOTH EXTRACTION      Medications Prior to Admission  Medication Sig Dispense Refill   Botulinum Toxin Type A (BOTOX) 200 units SOLR Inject 155 Units as directed every 3 (three) months. Inject 155units to head and neck intramuscularly every 3 months. 1 each 3   ibuprofen (ADVIL) 800 MG tablet Take 800 mg by mouth 2 (two) times daily as needed (pain.).     pantoprazole (PROTONIX) 40 MG tablet Take 40 mg by mouth in the morning and at bedtime.     loratadine (CLARITIN) 10 MG tablet Take 10 mg by mouth in the morning.     ondansetron (ZOFRAN-ODT) 8 MG disintegrating tablet Take 1 tablet (8 mg total) by mouth every 8 (eight) hours as needed for nausea or vomiting. 60 tablet 0   prochlorperazine (COMPAZINE) 10 MG tablet Take 1 tablet (10 mg total) by mouth every 8 (eight) hours as needed for nausea or vomiting (headache). 10 tablet 0   SUMAtriptan (IMITREX) 6 MG/0.5ML  SOSY injection Inject 0.5 mLs (6 mg total) into the skin every 2 (two) hours as needed for migraine or headache. 5 mL 0   tiZANidine (ZANAFLEX) 2 MG tablet TAKE 1 TABLET BY MOUTH EVERYDAY AT BEDTIME (Patient not taking: Reported on 08/12/2022) 90 tablet 1    Allergies:  Allergies  Allergen Reactions   Tylenol [Acetaminophen] Anaphylaxis   Aspirin Hives    Unknown childhood rxn   Benadryl [Diphenhydramine Hcl] Itching   Buspar [Buspirone] Other (See Comments)    migraines   Celexa [Citalopram Hydrobromide] Other (See Comments)    agitation and dizziness   Dilaudid [Hydromorphone Hcl] Itching    Pill only   Effexor Xr [Venlafaxine]     Other reaction(s): More depressed (11/2017-neurology)   Penicillins     hives   Prednisone     Swelling, hives   Sulfamethoxazole-Trimethoprim     Other reaction(s): swelling (2017)   Zoloft [Sertraline]     Other reaction(s): nausea and swelling   Latex Rash    Family History  Problem Relation Age of Onset   Hypertension Mother    Heart disease Father    Diabetes Father    Dementia Father    Cerebral aneurysm Sister    Migraines Sister    Seizures Sister  Heart disease Maternal Grandfather    Heart disease Other        mother's side   High Cholesterol Other        mother's side     Social History:  reports that she has never smoked. She has never used smokeless tobacco. She reports that she does not currently use alcohol. She reports that she does not use drugs.   ROS: As per HPI, all others negative   Blood pressure 130/70, pulse 71, temperature 97.6 F (36.4 C), temperature source Temporal, resp. rate 19, height '5\' 4"'$  (1.626 m), weight 103.4 kg, last menstrual period 08/20/2022, SpO2 99 %, currently breastfeeding. General appearance: NAD, overweight HEENT:  [place]/AT, anicteric NECK:  Thick, supple CV:  Regular ABD:  Protuberant, soft, mild generalized tenderness (chronic no change) NEURO:  A/O, no  encephalopathy  Assessment:   Dysphagia.  Abdominal pain History acute intermittent porphyria.  Plan:   Endoscopy with possible dilatation. Risks (bleeding, infection, bowel perforation that could require surgery, sedation-related changes in cardiopulmonary systems), benefits (identification and possible treatment of source of symptoms, exclusion of certain causes of symptoms), and alternatives (watchful waiting, radiographic imaging studies, empiric medical treatment) of upper endoscopy (EGD) were explained to patient/family in detail and patient wishes to proceed.   Landry Dyke 08/27/2022, 9:25 AM

## 2022-08-27 NOTE — Anesthesia Preprocedure Evaluation (Addendum)
Anesthesia Evaluation  Patient identified by MRN, date of birth, ID band Patient awake    Reviewed: Allergy & Precautions, H&P , NPO status , Patient's Chart, lab work & pertinent test results  Airway Mallampati: III  TM Distance: >3 FB Neck ROM: Full    Dental no notable dental hx. (+) Teeth Intact, Dental Advisory Given   Pulmonary neg pulmonary ROS,    Pulmonary exam normal breath sounds clear to auscultation       Cardiovascular  Rhythm:Regular Rate:Normal     Neuro/Psych  Headaches, Anxiety Depression    GI/Hepatic negative GI ROS, Neg liver ROS,   Endo/Other  Morbid obesity  Renal/GU negative Renal ROS  negative genitourinary   Musculoskeletal  (+) Arthritis , Osteoarthritis,    Abdominal   Peds  Hematology negative hematology ROS (+)   Anesthesia Other Findings   Reproductive/Obstetrics negative OB ROS                            Anesthesia Physical Anesthesia Plan  ASA: 2  Anesthesia Plan: MAC   Post-op Pain Management: Minimal or no pain anticipated   Induction: Intravenous  PONV Risk Score and Plan: 2 and Propofol infusion  Airway Management Planned: Natural Airway and Simple Face Mask  Additional Equipment:   Intra-op Plan:   Post-operative Plan:   Informed Consent: I have reviewed the patients History and Physical, chart, labs and discussed the procedure including the risks, benefits and alternatives for the proposed anesthesia with the patient or authorized representative who has indicated his/her understanding and acceptance.     Dental advisory given  Plan Discussed with: CRNA  Anesthesia Plan Comments:         Anesthesia Quick Evaluation

## 2022-08-27 NOTE — Anesthesia Postprocedure Evaluation (Signed)
Anesthesia Post Note  Patient: Debbie [name]  Procedure(s) Performed: ESOPHAGOGASTRODUODENOSCOPY (EGD) BIOPSY     Patient location during evaluation: Endoscopy Anesthesia Type: MAC Level of consciousness: awake and alert Pain management: pain level controlled Vital Signs Assessment: post-procedure vital signs reviewed and stable Respiratory status: spontaneous breathing, nonlabored ventilation and respiratory function stable Cardiovascular status: stable and blood pressure returned to baseline Postop Assessment: no apparent nausea or vomiting Anesthetic complications: no   No notable events documented.  Last Vitals:  Vitals:   08/27/22 1020 08/27/22 1032  BP: 108/78 118/76  Pulse: 72 61  Resp: 15 15  Temp:    SpO2: 99% 98%    Last Pain:  Vitals:   08/27/22 1032  TempSrc:   PainSc: 0-No pain                 Pavlos Yon,W. EDMOND

## 2022-08-27 NOTE — Anesthesia Procedure Notes (Signed)
Procedure Name: MAC Date/Time: 08/27/2022 9:43 AM  Performed by: Maxwell Caul, CRNAPre-anesthesia Checklist: Patient identified, Emergency Drugs available, Suction available and Patient being monitored Oxygen Delivery Method: Simple face mask

## 2022-08-28 LAB — SURGICAL PATHOLOGY

## 2022-09-01 ENCOUNTER — Encounter (HOSPITAL_COMMUNITY): Payer: Self-pay | Admitting: Gastroenterology

## 2022-09-15 ENCOUNTER — Telehealth: Payer: Self-pay

## 2022-09-15 ENCOUNTER — Encounter: Payer: Self-pay | Admitting: Family Medicine

## 2022-09-15 NOTE — Telephone Encounter (Signed)
It appears that patient's botox PA has expired.  Toxin: Botox 155 units  Dx: G43.709 Chronic migraine without aura without status migrainosus, not intractable  Provider: [name], NP  Please confirm if this is B/B or SP. Her appointment is 09/24/22.

## 2022-09-16 ENCOUNTER — Other Ambulatory Visit (HOSPITAL_COMMUNITY): Payer: Self-pay

## 2022-09-16 MED ORDER — BOTOX 200 UNITS IJ SOLR
155.0000 [IU] | INTRAMUSCULAR | 3 refills | Status: DC
  Filled 2022-09-16 – 2022-09-19 (×2): qty 1, 90d supply, fill #0
  Filled 2022-12-09: qty 1, 90d supply, fill #1
  Filled 2023-03-09: qty 1, 90d supply, fill #2
  Filled 2023-07-02: qty 1, 90d supply, fill #3

## 2022-09-16 NOTE — Telephone Encounter (Signed)
Botox order sent to [facility].

## 2022-09-16 NOTE — Telephone Encounter (Signed)
Patient Advocate Encounter   Received notification that prior authorization for Botox 200UNIT solution is required.   PA submitted on 09/16/2022 Key [id number] Status is pending       Lyndel Safe, [place] Patient Advocate Specialist [facility] Patient Advocate Team Direct Number: [phone]  Fax: [phone]

## 2022-09-16 NOTE — Progress Notes (Deleted)
[facility] Phone: [phone] Subjective:    I'm seeing this patient by the request  of:  Leeroy Cha, MD  CC:   [id number]  Debbie [name] is a 41 y.o. female coming in with complaint of back and neck pain. OMT 07/16/2022. Patient states   Medications patient has been prescribed: None  Taking:         Reviewed prior external information including notes and imaging from previsou exam, outside providers and external EMR if available.   As well as notes that were available from care everywhere and other healthcare systems.  Past medical history, social, surgical and family history all reviewed in electronic medical record.  No pertanent information unless stated regarding to the chief complaint.   Past Medical History:  Diagnosis Date   Anxiety    self reported   Arthritis    in spine   Chronic back pain 2021   low back/hip; had nerve ablation in lumbar/sacral joint   Depression    self reported   Hypertension    Maxillary sinus cyst    left   Migraine    Multiple thyroid nodules    3; 1.9 cm and 2 cm, 0.6 cm, will see general  surgeon   No pertinent past medical history    Ovarian cyst    UTI (urinary tract infection)     Allergies  Allergen Reactions   Tylenol [Acetaminophen] Anaphylaxis   Aspirin Hives    Unknown childhood rxn   Benadryl [Diphenhydramine Hcl] Itching   Buspar [Buspirone] Other (See Comments)    migraines   Celexa [Citalopram Hydrobromide] Other (See Comments)    agitation and dizziness   Dilaudid [Hydromorphone Hcl] Itching    Pill only   Effexor Xr [Venlafaxine]     Other reaction(s): More depressed (11/2017-neurology)   Penicillins     hives   Prednisone     Swelling, hives   Sulfamethoxazole-Trimethoprim     Other reaction(s): swelling (2017)   Zoloft [Sertraline]     Other reaction(s): nausea and swelling   Latex Rash     Review of  Systems:  No headache, visual changes, nausea, vomiting, diarrhea, constipation, dizziness, abdominal pain, skin rash, fevers, chills, night sweats, weight loss, swollen lymph nodes, body aches, joint swelling, chest pain, shortness of breath, mood changes. POSITIVE muscle aches  Objective  currently breastfeeding.   General: No apparent distress alert and oriented x3 mood and affect normal, dressed appropriately.  HEENT: Pupils equal, extraocular movements intact  Respiratory: Patient's speak in full sentences and does not appear short of breath  Cardiovascular: No lower extremity edema, non tender, no erythema  Gait MSK:  Back   Osteopathic findings  C2 flexed rotated and side bent right C6 flexed rotated and side bent left T3 extended rotated and side bent right inhaled rib T9 extended rotated and side bent left L2 flexed rotated and side bent right Sacrum right on right       Assessment and Plan:  No problem-specific Assessment & Plan notes found for this encounter.    Nonallopathic problems  Decision today to treat with OMT was based on Physical Exam  After verbal consent patient was treated with HVLA, ME, FPR techniques in cervical, rib, thoracic, lumbar, and sacral  areas  Patient tolerated the procedure well with improvement in symptoms  Patient given exercises, stretches and lifestyle modifications  See medications in patient instructions if given  Patient will  follow up in 4-8 weeks             Note: This dictation was prepared with Dragon dictation along with smaller phrase technology. Any transcriptional errors that result from this process are unintentional.

## 2022-09-16 NOTE — Telephone Encounter (Signed)
Patient Advocate Encounter  Prior Authorization for Botox 200UNIT solution  has been approved.    PA# [id number] Effective dates: 09/16/2022 through 09/17/2023  Can be filled at [place], [place] [facility] [place] Patient Advocate Team Direct Number: [phone]  Fax: [phone]

## 2022-09-16 NOTE — Addendum Note (Signed)
Addended by: Lester [place] A on: 09/16/2022 01:53 PM   Modules accepted: Orders

## 2022-09-17 ENCOUNTER — Ambulatory Visit: Payer: BC Managed Care – PPO | Admitting: Family Medicine
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
## 2022-09-19 ENCOUNTER — Other Ambulatory Visit (HOSPITAL_COMMUNITY): Payer: Self-pay

## 2022-09-22 ENCOUNTER — Other Ambulatory Visit: Payer: Self-pay | Admitting: Obstetrics and Gynecology

## 2022-09-22 ENCOUNTER — Other Ambulatory Visit (HOSPITAL_COMMUNITY): Payer: Self-pay

## 2022-09-22 NOTE — Telephone Encounter (Signed)
I called [facility] and spoke w/ Doren Custard. States Botox should be delivered to our office today for pt appt on 09/24/22.

## 2022-09-24 ENCOUNTER — Ambulatory Visit (INDEPENDENT_AMBULATORY_CARE_PROVIDER_SITE_OTHER): Payer: BC Managed Care – PPO | Admitting: Family Medicine

## 2022-09-24 VITALS — BP 136/87 | HR 110 | Ht 64.0 in | Wt 237.8 lb

## 2022-09-24 DIAGNOSIS — G43709 Chronic migraine without aura, not intractable, without status migrainosus: Secondary | ICD-10-CM | POA: Diagnosis not present

## 2022-09-24 MED ORDER — ONABOTULINUMTOXINA 200 UNITS IJ SOLR
155.0000 [IU] | Freq: Once | INTRAMUSCULAR | Status: AC
  Administered 2022-09-24: 155 [IU] via INTRAMUSCULAR

## 2022-10-15 NOTE — Progress Notes (Deleted)
[facility] [place] Phone: [phone] Subjective:    I'm seeing this patient by the request  of:  Leeroy Cha, MD  CC: Low back pain follow-up  [id number]  Debbie [name] is a 41 y.o. female coming in with complaint of back and neck pain. OMT 07/06/2022.  I have not seen patient for low back pain for quite some time now.  Patient has had difficulty since we have seen her.  Patient has been seeing general surgery for the multiple thyroid nodules, also been seen for reflux disease by gastroenterology.  Patient has been seen by neurology and getting Botox injections for headaches.  Patient is also scheduled for a hysterectomy in March of next year.  Patient states   Medications patient has been prescribed: None  Taking:         Reviewed prior external information including notes and imaging from previsou exam, outside providers and external EMR if available.   As well as notes that were available from care everywhere and other healthcare systems.  Past medical history, social, surgical and family history all reviewed in electronic medical record.  No pertanent information unless stated regarding to the chief complaint.   Past Medical History:  Diagnosis Date   Anxiety    self reported   Arthritis    in spine   Chronic back pain 2021   low back/hip; had nerve ablation in lumbar/sacral joint   Depression    self reported   Hypertension    Maxillary sinus cyst    left   Migraine    Multiple thyroid nodules    3; 1.9 cm and 2 cm, 0.6 cm, will see general  surgeon   No pertinent past medical history    Ovarian cyst    UTI (urinary tract infection)     Allergies  Allergen Reactions   Tylenol [Acetaminophen] Anaphylaxis   Aspirin Hives    Unknown childhood rxn   Benadryl [Diphenhydramine Hcl] Itching   Buspar [Buspirone] Other (See Comments)    migraines   Celexa [Citalopram Hydrobromide] Other  (See Comments)    agitation and dizziness   Dilaudid [Hydromorphone Hcl] Itching    Pill only   Effexor Xr [Venlafaxine]     Other reaction(s): More depressed (11/2017-neurology)   Penicillins     hives   Prednisone     Swelling, hives   Sulfamethoxazole-Trimethoprim     Other reaction(s): swelling (2017)   Zoloft [Sertraline]     Other reaction(s): nausea and swelling   Latex Rash     Review of Systems:  No visual changes, nausea, vomiting, diarrhea, constipation, dizziness, abdominal pain, skin rash, fevers, chills, night sweats, weight loss, swollen lymph nodes,  joint swelling, chest pain, shortness of breath, mood changes. POSITIVE muscle aches, body aches, headache  Objective  currently breastfeeding.   General: No apparent distress alert and oriented x3 mood and affect normal, dressed appropriately.  HEENT: Pupils equal, extraocular movements intact  Respiratory: Patient's speak in full sentences and does not appear short of breath  Cardiovascular: No lower extremity edema, non tender, no erythema  Gait MSK:  Back   Osteopathic findings  C2 flexed rotated and side bent right C6 flexed rotated and side bent left T3 extended rotated and side bent right inhaled rib T9 extended rotated and side bent left L2 flexed rotated and side bent right Sacrum right on right       Assessment and Plan:  No  problem-specific Assessment & Plan notes found for this encounter.    Nonallopathic problems  Decision today to treat with OMT was based on Physical Exam  After verbal consent patient was treated with HVLA, ME, FPR techniques in cervical, rib, thoracic, lumbar, and sacral  areas  Patient tolerated the procedure well with improvement in symptoms  Patient given exercises, stretches and lifestyle modifications  See medications in patient instructions if given  Patient will follow up in 4-8 weeks    The above documentation has been reviewed and is accurate and  complete Lyndal Pulley, DO          Note: This dictation was prepared with Dragon dictation along with smaller phrase technology. Any transcriptional errors that result from this process are unintentional.

## 2022-10-22 ENCOUNTER — Ambulatory Visit: Payer: BC Managed Care – PPO | Admitting: Family Medicine

## 2022-10-26 ENCOUNTER — Emergency Department (HOSPITAL_COMMUNITY): Payer: BC Managed Care – PPO

## 2022-10-26 ENCOUNTER — Encounter (HOSPITAL_COMMUNITY): Payer: Self-pay

## 2022-10-26 ENCOUNTER — Emergency Department (HOSPITAL_COMMUNITY)
Admission: EM | Admit: 2022-10-26 | Discharge: 2022-10-27 | Discharge status: Against Medical Advice | Payer: BC Managed Care – PPO | Attending: Emergency Medicine | Admitting: Emergency Medicine

## 2022-10-26 DIAGNOSIS — R200 Anesthesia of skin: Secondary | ICD-10-CM | POA: Diagnosis not present

## 2022-10-26 DIAGNOSIS — R110 Nausea: Secondary | ICD-10-CM | POA: Diagnosis not present

## 2022-10-26 DIAGNOSIS — R519 Headache, unspecified: Secondary | ICD-10-CM | POA: Insufficient documentation

## 2022-10-26 DIAGNOSIS — M542 Cervicalgia: Secondary | ICD-10-CM | POA: Insufficient documentation

## 2022-10-26 DIAGNOSIS — M79602 Pain in left arm: Secondary | ICD-10-CM | POA: Insufficient documentation

## 2022-10-26 DIAGNOSIS — Z5321 Procedure and treatment not carried out due to patient leaving prior to being seen by health care provider: Secondary | ICD-10-CM | POA: Insufficient documentation
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
## 2022-10-27 NOTE — ED Notes (Signed)
Patient stated she was leaving, because she has to go pick her child up.

## 2022-11-28 NOTE — Progress Notes (Unsigned)
[facility] [place] Phone: [phone] Subjective:   Debbie [name], am serving as a scribe for Dr. Hulan [name].  I'm seeing this patient by the request  of:  Debbie Cha, MD  CC: back and neck pain   [id number]  Debbie [name] is a 42 y.o. female coming in with complaint of back and neck pain. OMT 07/16/2022. Hysterectomy scheduled for March 2024. Patient states that she has been doing well since last visit.   Medications patient has been prescribed: None  Taking:         Reviewed prior external information including notes and imaging from previsou exam, outside providers and external EMR if available.   As well as notes that were available from care everywhere and other healthcare systems.  Past medical history, social, surgical and family history all reviewed in electronic medical record.  [name] pertanent information unless stated regarding to the chief complaint.   Past Medical History:  Diagnosis Date   Anxiety    self reported   Arthritis    in spine   Chronic back pain 2021   low back/hip; had nerve ablation in lumbar/sacral joint   Depression    self reported   Hypertension    Maxillary sinus cyst    left   Migraine    Multiple thyroid nodules    3; 1.9 cm and 2 cm, 0.6 cm, will see general  surgeon   [name] pertinent past medical history    Ovarian cyst    UTI (urinary tract infection)     Allergies  Allergen Reactions   Tylenol [Acetaminophen] Anaphylaxis   Aspirin Hives    Unknown childhood rxn   Benadryl [Diphenhydramine Hcl] Itching   Buspar [Buspirone] Other (See Comments)    migraines   Celexa [Citalopram Hydrobromide] Other (See Comments)    agitation and dizziness   Dilaudid [Hydromorphone Hcl] Itching    Pill only   Effexor Xr [Venlafaxine]     Other reaction(s): More depressed (11/2017-neurology)   Penicillins     hives   Prednisone     Swelling, hives    Sulfamethoxazole-Trimethoprim     Other reaction(s): swelling (2017)   Zoloft [Sertraline]     Other reaction(s): nausea and swelling   Latex Rash     Review of Systems:  [name] headache, visual changes, nausea, vomiting, diarrhea, constipation, dizziness, abdominal pain, skin rash, fevers, chills, night sweats, weight loss, swollen lymph nodes, body aches, joint swelling, chest pain, shortness of breath, mood changes. POSITIVE muscle aches  Objective  Blood pressure 122/82, pulse (!) 102, height '5\' 4"'$  (1.626 m), weight 237 lb (107.5 kg), SpO2 98 %, currently breastfeeding.   General: [name] apparent distress alert and oriented x3 mood and affect normal, dressed appropriately.  HEENT: Pupils equal, extraocular movements intact  Respiratory: Patient's speak in full sentences and does not appear short of breath  Cardiovascular: [name] lower extremity edema, non tender, [name] erythema  Back exam does have some loss of lordosis.  Some tenderness to palpation of the paraspinal musculature.  Some limited sidebending bilaterally.  Patient does have tightness between the shoulder blades right greater than left  Osteopathic findings  C5 flexed rotated and side bent left T3 extended rotated and side bent right inhaled rib L2 flexed rotated and side bent right Sacrum right on right       Assessment and Plan:  Degenerative disc disease, lumbar Does have some arthritic changes noted but overall  patient is doing a little bit better.  Encouraged her to go to try to lose weight.  Patient will continue to work out and did just recently.  Does have 2 young kids and does have difficulties secondary to the number of medications to help with patient's pain.  Does have some time cervicogenic headaches as well we will continue to monitor.  Follow-up again in 6 to 8 weeks    Nonallopathic problems  Decision today to treat with OMT was based on Physical Exam  After verbal consent patient was treated with HVLA, ME,  FPR techniques in cervical, rib, thoracic, lumbar, and sacral  areas  Patient tolerated the procedure well with improvement in symptoms  Patient given exercises, stretches and lifestyle modifications  See medications in patient instructions if given  Patient will follow up in 4-8 weeks     The above documentation has been reviewed and is accurate and complete Debbie Pulley, DO         Note: This dictation was prepared with Dragon dictation along with smaller phrase technology. Any transcriptional errors that result from this process are unintentional.

## 2022-12-04 ENCOUNTER — Ambulatory Visit (INDEPENDENT_AMBULATORY_CARE_PROVIDER_SITE_OTHER): Payer: BC Managed Care – PPO | Admitting: Family Medicine

## 2022-12-04 ENCOUNTER — Encounter: Payer: Self-pay | Admitting: Family Medicine

## 2022-12-04 VITALS — BP 122/82 | HR 102 | Ht 64.0 in | Wt 237.0 lb

## 2022-12-04 DIAGNOSIS — M9902 Segmental and somatic dysfunction of thoracic region: Secondary | ICD-10-CM

## 2022-12-04 DIAGNOSIS — M9904 Segmental and somatic dysfunction of sacral region: Secondary | ICD-10-CM

## 2022-12-04 DIAGNOSIS — M9901 Segmental and somatic dysfunction of cervical region: Secondary | ICD-10-CM

## 2022-12-04 DIAGNOSIS — M9903 Segmental and somatic dysfunction of lumbar region: Secondary | ICD-10-CM

## 2022-12-04 DIAGNOSIS — M9908 Segmental and somatic dysfunction of rib cage: Secondary | ICD-10-CM

## 2022-12-04 DIAGNOSIS — M5136 Other intervertebral disc degeneration, lumbar region: Secondary | ICD-10-CM

## 2022-12-04 NOTE — Assessment & Plan Note (Signed)
Does have some arthritic changes noted but overall patient is doing a little bit better.  Encouraged her to go to try to lose weight.  Patient will continue to work out and did just recently.  Does have 2 young kids and does have difficulties secondary to the number of medications to help with patient's pain.  Does have some time cervicogenic headaches as well we will continue to monitor.  Follow-up again in 6 to 8 weeks

## 2022-12-04 NOTE — Patient Instructions (Signed)
See me again in 5-6 weeks

## 2022-12-09 ENCOUNTER — Other Ambulatory Visit (HOSPITAL_COMMUNITY): Payer: Self-pay

## 2022-12-10 ENCOUNTER — Other Ambulatory Visit (HOSPITAL_COMMUNITY): Payer: Self-pay

## 2022-12-11 ENCOUNTER — Other Ambulatory Visit: Payer: Self-pay
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
## 2022-12-12 ENCOUNTER — Other Ambulatory Visit (HOSPITAL_COMMUNITY): Payer: Self-pay

## 2022-12-17 ENCOUNTER — Ambulatory Visit: Payer: BC Managed Care – PPO | Admitting: Family Medicine

## 2022-12-17 ENCOUNTER — Other Ambulatory Visit: Payer: Self-pay

## 2022-12-17 DIAGNOSIS — G43709 Chronic migraine without aura, not intractable, without status migrainosus: Secondary | ICD-10-CM | POA: Diagnosis not present

## 2022-12-17 MED ORDER — ONABOTULINUMTOXINA 200 UNITS IJ SOLR
155.0000 [IU] | Freq: Once | INTRAMUSCULAR | Status: AC
  Administered 2022-12-17: 155 [IU] via INTRAMUSCULAR

## 2023-01-14 NOTE — Progress Notes (Unsigned)
[place] Phone: [phone] Subjective:   Debbie [name], am serving as a scribe for Dr. Hulan Saas.  I'm seeing this patient by the request  of:  Leeroy Cha, MD  CC: Neck and back pain follow-up  [id number]  Debbie [name] is a 42 y.o. female coming in with complaint of back and neck pain. OMT on 12/04/2022. Patient states that she is doing the same as last visit.  Continues to have some tightness noted in the lower back.  Patient is going to be undergoing a hysterectomy in the near future.  Medications patient has been prescribed:   Taking:         Reviewed prior external information including notes and imaging from previsou exam, outside providers and external EMR if available.   As well as notes that were available from care everywhere and other healthcare systems.  Past medical history, social, surgical and family history all reviewed in electronic medical record.  [name] pertanent information unless stated regarding to the chief complaint.   Past Medical History:  Diagnosis Date   Anxiety    self reported   Arthritis    in spine   Chronic back pain 2021   low back/hip; had nerve ablation in lumbar/sacral joint   Depression    self reported   Hypertension    Maxillary sinus cyst    left   Migraine    Multiple thyroid nodules    3; 1.9 cm and 2 cm, 0.6 cm, will see general  surgeon   [name] pertinent past medical history    Ovarian cyst    UTI (urinary tract infection)     Allergies  Allergen Reactions   Tylenol [Acetaminophen] Anaphylaxis   Aspirin Hives    Unknown childhood rxn   Benadryl [Diphenhydramine Hcl] Itching   Buspar [Buspirone] Other (See Comments)    migraines   Celexa [Citalopram Hydrobromide] Other (See Comments)    agitation and dizziness   Dilaudid [Hydromorphone Hcl] Itching    Pill only   Effexor Xr [Venlafaxine]     Other reaction(s): More depressed  (11/2017-neurology)   Penicillins     hives   Prednisone     Swelling, hives   Sulfamethoxazole-Trimethoprim     Other reaction(s): swelling (2017)   Zoloft [Sertraline]     Other reaction(s): nausea and swelling   Latex Rash     Review of Systems:  [name] headache, visual changes, nausea, vomiting, diarrhea, constipation, dizziness, abdominal pain, skin rash, fevers, chills, night sweats, weight loss, swollen lymph nodes, body aches, joint swelling, chest pain, shortness of breath, mood changes. POSITIVE muscle aches  Objective  Blood pressure 106/68, pulse (!) 118, height 5' 4"$  (1.626 m), weight 237 lb (107.5 kg), SpO2 98 %, currently breastfeeding.   General: [name] apparent distress alert and oriented x3 mood and affect normal, dressed appropriately.  HEENT: Pupils equal, extraocular movements intact  Respiratory: Patient's speak in full sentences and does not appear short of breath  Cardiovascular: [name] lower extremity edema, non tender, [name] erythema  Gait MSK:  Back significant loss of lordosis.  Difficulty with any type of range of motion.  Does have a mild spasm noted of the hip flexor on the right side.  Neck exam does have some limited sidebending as well.  Osteopathic findings  C7 flexed rotated and side bent left T5 extended rotated and side bent right inhaled rib T9 extended rotated and side bent left  L3 flexed rotated and side bent right L5 flexed rotated and side bent left Sacrum right on right       Assessment and Plan:  Degenerative disc disease, lumbar Known degenerative disc disease.  Discussed posture and ergonomics, discussed which activities to do and which ones to avoid, increase activity slowly.  Discussed icing regimen.  Encourage patient to continue to work on weight loss.  Patient is finding it difficult secondary to some of her other chronic comorbidities.  Follow-up with me again 6 to 8 weeks after surgery    Nonallopathic problems  Decision today to  treat with OMT was based on Physical Exam  After verbal consent patient was treated with HVLA, ME, FPR techniques in cervical, rib, thoracic, lumbar, and sacral  areas  Patient tolerated the procedure well with improvement in symptoms  Patient given exercises, stretches and lifestyle modifications  See medications in patient instructions if given  Patient will follow up in 4-8 weeks     The above documentation has been reviewed and is accurate and complete Lyndal Pulley, DO         Note: This dictation was prepared with Dragon dictation along with smaller phrase technology. Any transcriptional errors that result from this process are unintentional.

## 2023-01-15 ENCOUNTER — Ambulatory Visit: Payer: BC Managed Care – PPO | Admitting: Family Medicine

## 2023-01-15 VITALS — BP 106/68 | HR 118 | Ht 64.0 in | Wt 237.0 lb

## 2023-01-15 DIAGNOSIS — M9908 Segmental and somatic dysfunction of rib cage: Secondary | ICD-10-CM | POA: Diagnosis not present

## 2023-01-15 DIAGNOSIS — M5136 Other intervertebral disc degeneration, lumbar region: Secondary | ICD-10-CM

## 2023-01-15 DIAGNOSIS — M9902 Segmental and somatic dysfunction of thoracic region: Secondary | ICD-10-CM

## 2023-01-15 DIAGNOSIS — M9901 Segmental and somatic dysfunction of cervical region: Secondary | ICD-10-CM

## 2023-01-15 DIAGNOSIS — M9903 Segmental and somatic dysfunction of lumbar region: Secondary | ICD-10-CM | POA: Diagnosis not present

## 2023-01-15 DIAGNOSIS — M9904 Segmental and somatic dysfunction of sacral region: Secondary | ICD-10-CM | POA: Diagnosis not present

## 2023-01-15 NOTE — Patient Instructions (Signed)
Good to see you See me again in 6-8 weeks 

## 2023-01-15 NOTE — Assessment & Plan Note (Signed)
Known degenerative disc disease.  Discussed posture and ergonomics, discussed which activities to do and which ones to avoid, increase activity slowly.  Discussed icing regimen.  Encourage patient to continue to work on weight loss.  Patient is finding it difficult secondary to some of her other chronic comorbidities.  Follow-up with me again 6 to 8 weeks after surgery

## 2023-01-27 ENCOUNTER — Other Ambulatory Visit: Payer: Self-pay

## 2023-01-27 ENCOUNTER — Encounter: Payer: Self-pay | Admitting: Family Medicine

## 2023-01-27 MED ORDER — TIZANIDINE HCL 4 MG PO TABS: 4.0000 mg | ORAL_TABLET | Freq: Every day | ORAL | 0 refills | Status: DC

## 2023-01-30 NOTE — Progress Notes (Signed)
Surgical Instructions    Your procedure is scheduled on Wednesday, 02/04/23.  Report to [facility] Main Entrance "A" at 6:30 A.M., then check in with the Admitting office.  Call this number if you have problems the morning of surgery:  [phone]   If you have any questions prior to your surgery date call [phone]: Open Monday-Friday 8am-4pm If you experience any cold or flu symptoms such as cough, fever, chills, shortness of breath, etc. between now and your scheduled surgery, please notify us at the above number     Remember:  Do not eat after midnight the night before your surgery  You may drink clear liquids until 5:30am the morning of your surgery.   Clear liquids allowed are: Water, Non-Citrus Juices (without pulp), Carbonated Beverages, Clear Tea, Black Coffee ONLY (NO MILK, CREAM OR POWDERED CREAMER of any kind), and Gatorade    Take these medicines the morning of surgery with A SIP OF WATER: NONE   As of today, STOP taking any Aspirin (unless otherwise instructed by your surgeon) Aleve, Naproxen, Ibuprofen, Motrin, Advil, Goody's, BC's, all herbal medications, fish oil, and all vitamins.           Do not wear jewelry or makeup. Do not wear lotions, powders, perfumes or deodorant. Do not shave 48 hours prior to surgery.   Do not bring valuables to the hospital. Do not wear nail polish, gel polish, artificial nails, or any other type of covering on natural nails (fingers and toes) If you have artificial nails or gel coating that need to be removed by a nail salon, please have this removed prior to surgery. Artificial nails or gel coating may interfere with anesthesia's ability to adequately monitor your vital signs.  [facility] is not responsible for any belongings or valuables.    Do NOT Smoke (Tobacco/Vaping)  24 hours prior to your procedure  If you use a CPAP at night, you may bring your mask for your overnight stay.   Contacts, glasses, hearing aids, dentures or  partials may not be worn into surgery, please bring cases for these belongings   For patients admitted to the hospital, discharge time will be determined by your treatment team.   Patients discharged the day of surgery will not be allowed to drive home, and someone needs to stay with them for 24 hours.   SURGICAL WAITING ROOM VISITATION Patients having surgery or a procedure may have no more than 2 support people in the waiting area - these visitors may rotate.   Children under the age of 73 must have an adult with them who is not the patient. If the patient needs to stay at the hospital during part of their recovery, the visitor guidelines for inpatient rooms apply. Pre-op nurse will coordinate an appropriate time for 1 support person to accompany patient in pre-op.  This support person may not rotate.   Please refer to [email or web address] for the visitor guidelines for Inpatients (after your surgery is over and you are in a regular room).    Special instructions:    Oral Hygiene is also important to reduce your risk of infection.  Remember - BRUSH YOUR TEETH THE MORNING OF SURGERY WITH YOUR REGULAR TOOTHPASTE   - Preparing For Surgery  Before surgery, you can play an important role. Because skin is not sterile, your skin needs to be as free of germs as possible. You can reduce the number of germs on your skin by washing with CHG (chlorahexidine gluconate)  Soap before surgery.  CHG is an antiseptic cleaner which kills germs and bonds with the skin to continue killing germs even after washing.     Please do not use if you have an allergy to CHG or antibacterial soaps. If your skin becomes reddened/irritated stop using the CHG.  Do not shave (including legs and underarms) for at least 48 hours prior to first CHG shower. It is OK to shave your face.  Please follow these instructions carefully.     Shower the NIGHT BEFORE  SURGERY and the MORNING OF SURGERY with CHG Soap.   If you chose to wash your hair, wash your hair first as usual with your normal shampoo. After you shampoo, rinse your hair and body thoroughly to remove the shampoo.  Then [employer] and genitals (private parts) with your normal soap and rinse thoroughly to remove soap.  After that Use CHG Soap as you would any other liquid soap. You can apply CHG directly to the skin and wash gently with a scrungie or a clean washcloth.   Apply the CHG Soap to your body ONLY FROM THE NECK DOWN.  Do not use on open wounds or open sores. Avoid contact with your eyes, ears, mouth and genitals (private parts). Wash Face and genitals (private parts)  with your normal soap.   Wash thoroughly, paying special attention to the area where your surgery will be performed.  Thoroughly rinse your body with warm water from the neck down.  DO NOT shower/wash with your normal soap after using and rinsing off the CHG Soap.  Pat yourself dry with a CLEAN TOWEL.  Wear CLEAN PAJAMAS to bed the night before surgery  Place CLEAN SHEETS on your bed the night before your surgery  DO NOT SLEEP WITH PETS.   Day of Surgery: Take a shower with CHG soap. Wear Clean/Comfortable clothing the morning of surgery Do not apply any deodorants/lotions.   Remember to brush your teeth WITH YOUR REGULAR TOOTHPASTE.    If you received a COVID test during your pre-op visit, it is requested that you wear a mask when out in public, stay away from anyone that may not be feeling well, and notify your surgeon if you develop symptoms. If you have been in contact with anyone that has tested positive in the last 10 days, please notify your surgeon.    Please read over the following fact sheets that you were given.

## 2023-02-02 ENCOUNTER — Encounter (HOSPITAL_COMMUNITY): Payer: Self-pay

## 2023-02-02 ENCOUNTER — Other Ambulatory Visit: Payer: Self-pay

## 2023-02-02 ENCOUNTER — Encounter (HOSPITAL_COMMUNITY)
Admission: RE | Admit: 2023-02-02 | Discharge: 2023-02-02 | Disposition: A | Discharge status: Home / Self Care | Payer: BC Managed Care – PPO | Source: Ambulatory Visit | Attending: Obstetrics and Gynecology | Admitting: Obstetrics and Gynecology

## 2023-02-02 VITALS — BP 139/91 | HR 86 | Temp 98.1°F | Resp 18 | Ht 64.0 in | Wt 235.0 lb

## 2023-02-02 DIAGNOSIS — I2510 Atherosclerotic heart disease of native coronary artery without angina pectoris: Secondary | ICD-10-CM | POA: Insufficient documentation

## 2023-02-02 DIAGNOSIS — Z01818 Encounter for other preprocedural examination: Secondary | ICD-10-CM | POA: Insufficient documentation

## 2023-02-02 HISTORY — DX: Personal history of other diseases of the digestive system: Z87.19

## 2023-02-02 LAB — BASIC METABOLIC PANEL
Anion gap: 8 (ref 5–15)
BUN: 7 mg/dL (ref 6–20)
CO2: 26 mmol/L (ref 22–32)
Calcium: 9.2 mg/dL (ref 8.9–10.3)
Chloride: 103 mmol/L (ref 98–111)
Creatinine, Ser: 0.93 mg/dL (ref 0.44–1.00)
GFR, Estimated: >60 mL/min (ref >60)
Glucose, Bld: 99 mg/dL (ref 70–99)
Potassium: 4.5 mmol/L (ref 3.5–5.1)
Sodium: 137 mmol/L (ref 135–145)

## 2023-02-02 LAB — TYPE AND SCREEN
ABO/RH(D): A POS
Antibody Screen: NEGATIVE

## 2023-02-02 LAB — CBC
HCT: 44.1 % (ref 36.0–46.0)
Hemoglobin: 14.1 g/dL (ref 12.0–15.0)
MCH: 26.9 pg (ref 26.0–34.0)
MCHC: 32 g/dL (ref 30.0–36.0)
MCV: 84 fL (ref 80.0–100.0)
Platelets: 346 10*3/uL (ref 150–400)
RBC: 5.25 MIL/uL — ABNORMAL HIGH (ref 3.87–5.11)
RDW: 13.5 % (ref 11.5–15.5)
WBC: 13.1 10*3/uL — ABNORMAL HIGH (ref 4.0–10.5)
nRBC: 0 % (ref 0.0–0.2)

## 2023-02-02 NOTE — Progress Notes (Signed)
PCP - Leeroy Cha Cardiologist - Berniece Salines  PPM/ICD - denies  Chest x-ray - n/a EKG - 02/02/23 Stress Test - denies ECHO - 09/20/21 Cardiac Cath - denies  Sleep Study - denies  ERAS Protcol -yes PRE-SURGERY Ensure or G2- not ordered  COVID TEST- positive for covid January 24th, 2024   Anesthesia review:   Patient denies shortness of breath, fever, cough and chest pain at PAT appointment   All instructions explained to the patient, with a verbal understanding of the material. Patient agrees to go over the instructions while at home for a better understanding. Patient also instructed to self quarantine after being tested for COVID-19. The opportunity to ask questions was provided.

## 2023-02-03 ENCOUNTER — Encounter: Payer: Self-pay | Admitting: Cardiology

## 2023-02-03 ENCOUNTER — Other Ambulatory Visit: Payer: Self-pay | Admitting: Obstetrics and Gynecology

## 2023-02-03 DIAGNOSIS — N920 Excessive and frequent menstruation with regular cycle: Secondary | ICD-10-CM

## 2023-02-03 MED ORDER — BUPIVACAINE LIPOSOME 1.3 % IJ SUSP: 20.0000 mL | Freq: Once | INTRAMUSCULAR | Status: AC

## 2023-02-03 NOTE — Anesthesia Preprocedure Evaluation (Addendum)
Anesthesia Evaluation  Patient identified by MRN, date of birth, ID band Patient awake    Reviewed: Allergy & Precautions, NPO status , Patient's Chart, lab work & pertinent test results  History of Anesthesia Complications Negative for: history of anesthetic complications  Airway Mallampati: II  TM Distance: >3 FB Neck ROM: Full    Dental  (+) Dental Advisory Given, Teeth Intact   Pulmonary neg pulmonary ROS   Pulmonary exam normal        Cardiovascular hypertension, Normal cardiovascular exam     Neuro/Psych  Headaches PSYCHIATRIC DISORDERS Anxiety Depression     Vertigo Right-sided hearing loss     GI/Hepatic Neg liver ROS, hiatal hernia,,,  Endo/Other    Morbid obesity  Renal/GU negative Renal ROS     Musculoskeletal  (+) Arthritis ,    Abdominal  (+) + obese  Peds  (+) ADHD Hematology negative hematology ROS (+)   Anesthesia Other Findings Myofascial pain dysfunction syndrome Porphyria   Reproductive/Obstetrics  s/p tubal ligation                              Anesthesia Physical Anesthesia Plan  ASA: 3  Anesthesia Plan: General   Post-op Pain Management: Toradol IV (intra-op)*   Induction: Intravenous  PONV Risk Score and Plan: 3 and Treatment may vary due to age or medical condition, Ondansetron, Dexamethasone, Midazolam and Scopolamine patch - Pre-op  Airway Management Planned: Oral ETT  Additional Equipment: None  Intra-op Plan:   Post-operative Plan: Extubation in OR  Informed Consent: I have reviewed the patients History and Physical, chart, labs and discussed the procedure including the risks, benefits and alternatives for the proposed anesthesia with the patient or authorized representative who has indicated his/her understanding and acceptance.     Dental advisory given  Plan Discussed with: CRNA and Anesthesiologist  Anesthesia Plan Comments:         Anesthesia Quick Evaluation

## 2023-02-03 NOTE — H&P (Deleted)
  The note originally documented on this encounter has been moved the the encounter in which it belongs.  

## 2023-02-03 NOTE — H&P (Signed)
[facility] for Appointment 1. Preop History of Present Illness General:         41 y/o presents for preop visit. Pt is schedule for a robotic assisted laparoscopic hysterectomy with bilateral salpingo-oophorectomy on 02/04/2023 for the management of menorrhagia. she has a history of prophyria and desires to have ovaries removed to help with symptoms associated with porphyria.  she is aware that will cause surgical menopause.  Ultrasound on 09/22/2022 reveals a uterus that measures 8.8 cm x 5.5 cm x 6.5 cm. Her ovaries appeared normal bilaterally.  Current Medications Taking Pantoprazole Sodium 40 MG Tablet Delayed Release Take 1 tablet 30 minutes before meal on an empty stomach Orally Twice a day Botox , Notes to Pharmacist: for migraines Medication List reviewed and reconciled with the patient Past Medical History      Migraine (Dr. Ahern).      Anxiety.      Depression.      Concentration deficit versus adult ADD (see 02/2015 and 12/2015 notes).      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      Thyroid nodules 3.      Porphyria. Surgical History       C section 08/2012       shoulder surgery right 2005       removed right tubes due to ectopic at 7 wks 06/2011       nerve ablation l-spine 2021       C section 11/26/2021       endoscopy 08/2022 Family History Father: deceased, dementia, diagnosed with Diabetes Mother: alive 63 yrs, diagnosed with Hypertension Paternal Grand Father: deceased Paternal Grand Mother: deceased, diagnosed with Coronary artery disease Maternal Grand Father: deceased, diagnosed with Coronary artery disease Maternal Grand Mother: alive 83 yrs, A + W Brother 1: alive Brother2: alive Brother 3: alive Sister 1: alive 42 yrs, Pots syndrome, Brain aneurysm, L nephrectomy( renal vein entrapment) Sister 2: alive 38 yrs, diagnosed with Hypertension Sister 3: alive Son(s): alive, ADHD 3 brother(s) , 7 sister(s) . 2 son(s) . denies breast,  uterine, ovarian or colon cancer   1 brother 1/2 sibling No Family History of Colon Cancer, Polyps, kidney or Liver Disease. Social History General:  Tobacco use  cigarettes:  Never smoked, Tobacco history last updated  01/12/2023, Vaping  No. EXPOSURE TO PASSIVE SMOKE: no. Alcohol: no. Caffeine: yes, coffee, occasionally. Recreational drug use: no, in past. DIET: keto diet. Exercise: 1-2 times per week, walks. Marital Status: married. Children: 2 sons. OCCUPATION: IT specialist. Gyn History Sexual activity not currently sexually active.  Periods : irregular.  LMP 01/05/2023.  Birth control Tubes tied.  Last pap smear date 05/29/2021 - neg.  Denies H/O Last mammogram date N/A.  Denies H/O Abnormal pap smear no.  Denies H/O STD.  OB History Number of pregnancies  3.  miscarriages  1.  Pregnancy # 1  2012 - ectopic.  Pregnancy # 2  2013 - live birth, C-section, female.  Pregnancy # 3  live birth, C-section.  Ectopic pregnancy  1.  Allergies Penicillin (for allergy): rash - Side Effects Benadryl: hives - Side Effects Tylenol: anaphylaxis - Side Effects Aspirin: anaphylaxis - Side Effects Zoloft: nausea and swelling BuSpar: migraines Citalopram Hydrobromide: agitation and dizziness Bactrim: swelling (2017) - Allergy Effexor XR: More depressed (11/2017-neurology) Atorvastatin Calcium: Side Effects - Criticality Unknown - Onset Date 04/01/2021 Hospitalization/Major Diagnostic Procedure childbirth Seizures 01/2021 Seizuires, tachycardia, High BP 02/2021 Childbirth 11/26/2021 Review of Systems   CONSTITUTIONAL:         Chills No.  Fatigue No.  Fever No.  Night sweats No.  Recent travel outside [place] No.  Sweats No.  Weight change No.     OPHTHALMOLOGY:         Blurring of vision no.  Change in vision no.  Double vision no.     ENT:         Dizziness no.  Nose bleeds no.  Sore throat no.  Teeth pain no.     ALLERGY:         Hives no.     CARDIOLOGY:         Chest pain no.  High blood  pressure no.  Irregular heart beat no.  Leg edema no.  Palpitations no.     RESPIRATORY:         Shortness of breath no.  Cough no.  Wheezing no.     UROLOGY:         Pain with urination no.  Urinary urgency no.  Urinary frequency no.  Urinary incontinence no.  Difficulty urinating No.  Blood in urine No.     GASTROENTEROLOGY:         Abdominal pain no.  Appetite change no.  Bloating/belching no.  Blood in stool or on toilet paper no.  Change in bowel movements no.  Constipation no.  Diarrhea no.  Difficulty swallowing no.  Nausea no.     FEMALE REPRODUCTIVE:         Vulvar pain no.  Vulvar rash no.  Abnormal vaginal bleeding , heavy menses.  Breast pain no.  Nipple discharge no.  Pain with intercourse no.  Pelvic pain no.  Unusual vaginal discharge no.  Vaginal itching no.     MUSCULOSKELETAL:         Muscle aches no.     NEUROLOGY:         Headache no.  Tingling/numbness no.  Weakness no.     PSYCHOLOGY:         Depression no.  Anxiety no.  Nervousness no.  Sleep disturbances no.  Suicidal ideation no .     ENDOCRINOLOGY:         Excessive thirst no.  Excessive urination no.  Hair loss no.  Heat or cold intolerance no.     HEMATOLOGY/LYMPH:         Abnormal bleeding no.  Easy bruising no.  Swollen glands no.     DERMATOLOGY:         New/changing skin lesion no.  Rash no.  Sores no.      Vital Signs Wt: 237.0, Wt change: -2.6 lbs, Ht: 63, BMI: 41.98, Pulse sitting: 81, BP sitting: 122/88. Examination General Examination:        CONSTITUTIONAL: alert, oriented, NAD. SKIN:  moist, warm. EYES:  Conjunctiva clear. LUNGS: good I:E efffort noted, clear to auscultation bilaterally. HEART: regular rate and rhythm. ABDOMEN: soft, non-tender/non-distended, bowel sounds present. FEMALE GENITOURINARY: normal external genitalia, labia - unremarkable, vagina - pink moist mucosa, no lesions or abnormal discharge, cervix - no discharge or lesions or CMT, adnexa - no masses or tenderness, uterus -  nontender and normal size on palpation. EXTREMITIES: no edema present. PSYCH:  affect normal, good eye contact.  Physical Examination Chaperone present:         Chaperone present  Plata, Menda 01/12/2023 11:34:12 AM >, for pelvic exam.      Assessments 1. Menorrhagia with regular cycle -   N92.0 (Primary) 2. 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 Menorrhagia with regular cycle  Notes: Plan on robotic-assisted laparoscopic hysterectomy with bilateral salpingo-oophorectomy Pt advised she can go home same day or stay overnight. if conversion to larger incision. is required she would stay over night or 2 nights . She is advised that in order to be discharged from hospital, she will need to be able to ambulate, urinate, tolerate food by mouth, and pain must be controlled with oral medication.s Discussed risks of hysterectomy including but not limited to infection, bleeding, conversion to larger incision, damage to her bowel, bladder, or ureters, with the need for further surgery. Discussed risk of blood transfusion and risk of HIV or hep B&C Pt is aware of risks and desires blood transfusion if needed. Pt advised to avoid NSAIDs (Aspirin, Aleve, Advil, Ibuprofen, Motrin) from now until surgery given risk of bleeding during surgery. She may take Tylenol for pain management. She is advised to avoid eating or drinking starting midnight prior to surgery. Discussed post-surgery avoidance of driving for 1 week and avoidance of lifting weight greater than 10 lbs or intercourse for 6-8 weeks after procedure.     2. Porphyria, unspecified porphyria type  Notes: planning oophorectomy due to porphyria in hopes that sympotms from prophyria will improve. pt is aware that removing her ovaries will place her in surgical menopause     

## 2023-02-04 ENCOUNTER — Observation Stay (HOSPITAL_COMMUNITY): Payer: BC Managed Care – PPO | Admitting: Certified Registered Nurse Anesthetist

## 2023-02-04 ENCOUNTER — Other Ambulatory Visit: Payer: Self-pay

## 2023-02-04 ENCOUNTER — Encounter (HOSPITAL_COMMUNITY)
Admission: RE | Disposition: A | Discharge status: Home / Self Care | Payer: Self-pay | Source: Home / Self Care | Attending: Obstetrics and Gynecology

## 2023-02-04 ENCOUNTER — Encounter (HOSPITAL_COMMUNITY): Payer: Self-pay | Admitting: Obstetrics and Gynecology

## 2023-02-04 ENCOUNTER — Observation Stay (HOSPITAL_COMMUNITY)
Admission: RE | Admit: 2023-02-04 | Discharge: 2023-02-05 | Disposition: A | Discharge status: Home / Self Care | Payer: BC Managed Care – PPO | Attending: Obstetrics and Gynecology | Admitting: Obstetrics and Gynecology

## 2023-02-04 DIAGNOSIS — D259 Leiomyoma of uterus, unspecified: Principal | ICD-10-CM | POA: Insufficient documentation

## 2023-02-04 DIAGNOSIS — D270 Benign neoplasm of right ovary: Secondary | ICD-10-CM | POA: Diagnosis not present

## 2023-02-04 DIAGNOSIS — N920 Excessive and frequent menstruation with regular cycle: Secondary | ICD-10-CM | POA: Diagnosis present

## 2023-02-04 DIAGNOSIS — D271 Benign neoplasm of left ovary: Secondary | ICD-10-CM | POA: Diagnosis not present

## 2023-02-04 DIAGNOSIS — D282 Benign neoplasm of uterine tubes and ligaments: Secondary | ICD-10-CM | POA: Diagnosis not present

## 2023-02-04 DIAGNOSIS — N946 Dysmenorrhea, unspecified: Secondary | ICD-10-CM | POA: Diagnosis present

## 2023-02-04 DIAGNOSIS — Z01818 Encounter for other preprocedural examination: Secondary | ICD-10-CM

## 2023-02-04 DIAGNOSIS — Z90710 Acquired absence of both cervix and uterus: Secondary | ICD-10-CM | POA: Diagnosis present

## 2023-02-04 HISTORY — DX: Unspecified porphyria: E80.20

## 2023-02-04 HISTORY — PX: ROBOTIC ASSISTED LAPAROSCOPIC LYSIS OF ADHESION: SHX6080

## 2023-02-04 HISTORY — PX: ROBOTIC ASSISTED LAPAROSCOPIC HYSTERECTOMY AND SALPINGECTOMY: SHX6379

## 2023-02-04 LAB — POCT PREGNANCY, URINE: Preg Test, Ur: NEGATIVE

## 2023-02-04 SURGERY — XI ROBOTIC ASSISTED LAPAROSCOPIC HYSTERECTOMY AND SALPINGECTOMY
Anesthesia: General | Site: Abdomen | Laterality: Bilateral

## 2023-02-04 MED ORDER — ONDANSETRON HCL 4 MG PO TABS: 4.0000 mg | ORAL_TABLET | Freq: Four times a day (QID) | ORAL | Status: DC | PRN

## 2023-02-04 MED ORDER — BUPIVACAINE LIPOSOME 1.3 % IJ SUSP
INTRAMUSCULAR | Status: AC
  Filled 2023-02-04: qty 20

## 2023-02-04 MED ORDER — KETAMINE HCL 50 MG/5ML IJ SOSY
PREFILLED_SYRINGE | INTRAMUSCULAR | Status: AC
  Filled 2023-02-04: qty 5

## 2023-02-04 MED ORDER — LIDOCAINE 2% (20 MG/ML) 5 ML SYRINGE
INTRAMUSCULAR | Status: AC
  Filled 2023-02-04: qty 5

## 2023-02-04 MED ORDER — GENTAMICIN IN SALINE 1.6-0.9 MG/ML-% IV SOLN
INTRAVENOUS | Status: AC
  Filled 2023-02-04: qty 50

## 2023-02-04 MED ORDER — PHENYLEPHRINE HCL-NACL 20-0.9 MG/250ML-% IV SOLN
INTRAVENOUS | Status: DC | PRN
  Administered 2023-02-04: 30 ug/min via INTRAVENOUS

## 2023-02-04 MED ORDER — POVIDONE-IODINE 10 % EX SWAB
2.0000 | Freq: Once | CUTANEOUS | Status: AC
  Administered 2023-02-04: 2 via TOPICAL

## 2023-02-04 MED ORDER — LIDOCAINE 2% (20 MG/ML) 5 ML SYRINGE
INTRAMUSCULAR | Status: DC | PRN
  Administered 2023-02-04: 100 mg via INTRAVENOUS

## 2023-02-04 MED ORDER — FENTANYL CITRATE (PF) 100 MCG/2ML IJ SOLN
INTRAMUSCULAR | Status: AC
  Filled 2023-02-04: qty 2

## 2023-02-04 MED ORDER — MIDAZOLAM HCL 2 MG/2ML IJ SOLN
INTRAMUSCULAR | Status: AC
  Filled 2023-02-04: qty 2

## 2023-02-04 MED ORDER — CLINDAMYCIN PHOSPHATE 900 MG/50ML IV SOLN
900.0000 mg | INTRAVENOUS | Status: AC
  Administered 2023-02-04: 900 mg via INTRAVENOUS

## 2023-02-04 MED ORDER — PROPOFOL 10 MG/ML IV BOLUS
INTRAVENOUS | Status: AC
  Filled 2023-02-04: qty 20

## 2023-02-04 MED ORDER — MENTHOL 3 MG MT LOZG: 1.0000 | LOZENGE | OROMUCOSAL | Status: DC | PRN

## 2023-02-04 MED ORDER — SODIUM CHLORIDE 0.9 % IR SOLN
Status: DC | PRN
  Administered 2023-02-04: 1000 mL

## 2023-02-04 MED ORDER — HEMOSTATIC AGENTS (NO CHARGE) OPTIME
TOPICAL | Status: DC | PRN
  Administered 2023-02-04 (×2): 1 via TOPICAL

## 2023-02-04 MED ORDER — SUGAMMADEX SODIUM 500 MG/5ML IV SOLN
INTRAVENOUS | Status: DC | PRN
  Administered 2023-02-04: 100 mg via INTRAVENOUS
  Administered 2023-02-04: 25 mg via INTRAVENOUS
  Administered 2023-02-04: 100 mg via INTRAVENOUS

## 2023-02-04 MED ORDER — ONDANSETRON HCL 4 MG/2ML IJ SOLN
INTRAMUSCULAR | Status: AC
  Filled 2023-02-04: qty 2

## 2023-02-04 MED ORDER — LACTATED RINGERS IV SOLN: INTRAVENOUS | Status: DC

## 2023-02-04 MED ORDER — CHLORHEXIDINE GLUCONATE 0.12 % MT SOLN
OROMUCOSAL | Status: AC
  Administered 2023-02-04: 15 mL via OROMUCOSAL
  Filled 2023-02-04: qty 15

## 2023-02-04 MED ORDER — ONDANSETRON HCL 4 MG/2ML IJ SOLN: 4.0000 mg | Freq: Four times a day (QID) | INTRAMUSCULAR | Status: DC | PRN

## 2023-02-04 MED ORDER — SODIUM CHLORIDE (PF) 0.9 % IJ SOLN
INTRAMUSCULAR | Status: AC
  Filled 2023-02-04: qty 10

## 2023-02-04 MED ORDER — ORAL CARE MOUTH RINSE: 15.0000 mL | Freq: Once | OROMUCOSAL | Status: AC

## 2023-02-04 MED ORDER — OXYCODONE HCL 5 MG PO TABS
5.0000 mg | ORAL_TABLET | ORAL | Status: DC | PRN
  Administered 2023-02-05: 10 mg via ORAL
  Filled 2023-02-04: qty 2

## 2023-02-04 MED ORDER — TIZANIDINE HCL 4 MG PO TABS
4.0000 mg | ORAL_TABLET | Freq: Every day | ORAL | Status: DC
  Administered 2023-02-04: 4 mg via ORAL
  Filled 2023-02-04 (×2): qty 1

## 2023-02-04 MED ORDER — OXYCODONE HCL 5 MG PO TABS: 5.0000 mg | ORAL_TABLET | Freq: Once | ORAL | Status: AC | PRN

## 2023-02-04 MED ORDER — CLINDAMYCIN PHOSPHATE 900 MG/50ML IV SOLN
INTRAVENOUS | Status: AC
  Filled 2023-02-04: qty 50

## 2023-02-04 MED ORDER — GLYCOPYRROLATE PF 0.2 MG/ML IJ SOSY
PREFILLED_SYRINGE | INTRAMUSCULAR | Status: DC | PRN
  Administered 2023-02-04: .1 mg via INTRAVENOUS

## 2023-02-04 MED ORDER — FENTANYL CITRATE (PF) 250 MCG/5ML IJ SOLN
INTRAMUSCULAR | Status: DC | PRN
  Administered 2023-02-04: 25 ug via INTRAVENOUS
  Administered 2023-02-04: 50 ug via INTRAVENOUS
  Administered 2023-02-04: 100 ug via INTRAVENOUS
  Administered 2023-02-04: 25 ug via INTRAVENOUS
  Administered 2023-02-04: 50 ug via INTRAVENOUS

## 2023-02-04 MED ORDER — SODIUM CHLORIDE (PF) 0.9 % IJ SOLN
INTRAMUSCULAR | Status: AC
  Filled 2023-02-04: qty 50

## 2023-02-04 MED ORDER — SENNA 8.6 MG PO TABS
1.0000 | ORAL_TABLET | Freq: Two times a day (BID) | ORAL | Status: DC
  Administered 2023-02-04 – 2023-02-05 (×2): 8.6 mg via ORAL
  Filled 2023-02-04 (×2): qty 1

## 2023-02-04 MED ORDER — GENTAMICIN SULFATE 40 MG/ML IJ SOLN
5.0000 mg/kg | INTRAVENOUS | Status: AC
  Administered 2023-02-04: 530 mg via INTRAVENOUS
  Filled 2023-02-04: qty 13.25

## 2023-02-04 MED ORDER — DEXAMETHASONE SODIUM PHOSPHATE 10 MG/ML IJ SOLN
INTRAMUSCULAR | Status: AC
  Filled 2023-02-04: qty 1

## 2023-02-04 MED ORDER — OXYCODONE HCL 5 MG PO TABS
ORAL_TABLET | ORAL | Status: AC
  Administered 2023-02-04: 5 mg via ORAL
  Filled 2023-02-04: qty 1

## 2023-02-04 MED ORDER — HYDROMORPHONE HCL 1 MG/ML IJ SOLN
2.0000 mg | Freq: Four times a day (QID) | INTRAMUSCULAR | Status: DC | PRN
  Administered 2023-02-04 – 2023-02-05 (×3): 2 mg via INTRAVENOUS
  Filled 2023-02-04 (×3): qty 2

## 2023-02-04 MED ORDER — PROPOFOL 1000 MG/100ML IV EMUL
INTRAVENOUS | Status: AC
  Filled 2023-02-04: qty 100

## 2023-02-04 MED ORDER — IBUPROFEN 600 MG PO TABS
600.0000 mg | ORAL_TABLET | Freq: Four times a day (QID) | ORAL | Status: DC
  Administered 2023-02-04 – 2023-02-05 (×3): 600 mg via ORAL
  Filled 2023-02-04 (×3): qty 1

## 2023-02-04 MED ORDER — KETOROLAC TROMETHAMINE 30 MG/ML IJ SOLN
INTRAMUSCULAR | Status: DC | PRN
  Administered 2023-02-04: 30 mg via INTRAVENOUS

## 2023-02-04 MED ORDER — MIDAZOLAM HCL 2 MG/2ML IJ SOLN
INTRAMUSCULAR | Status: DC | PRN
  Administered 2023-02-04: 2 mg via INTRAVENOUS

## 2023-02-04 MED ORDER — SODIUM CHLORIDE 0.9 % IV SOLN
INTRAVENOUS | Status: DC | PRN
  Administered 2023-02-04: 80 mL

## 2023-02-04 MED ORDER — METHYLENE BLUE 1 % INJ SOLN
INTRAVENOUS | Status: DC | PRN
  Administered 2023-02-04: 10 mL

## 2023-02-04 MED ORDER — METHYLENE BLUE 1 % INJ SOLN
INTRAVENOUS | Status: AC
  Filled 2023-02-04: qty 10

## 2023-02-04 MED ORDER — KETAMINE HCL 10 MG/ML IJ SOLN
INTRAMUSCULAR | Status: DC | PRN
  Administered 2023-02-04: 10 mg via INTRAVENOUS
  Administered 2023-02-04: 20 mg via INTRAVENOUS

## 2023-02-04 MED ORDER — KETOROLAC TROMETHAMINE 30 MG/ML IJ SOLN
INTRAMUSCULAR | Status: AC
  Filled 2023-02-04: qty 1

## 2023-02-04 MED ORDER — PHENYLEPHRINE 80 MCG/ML (10ML) SYRINGE FOR IV PUSH (FOR BLOOD PRESSURE SUPPORT)
PREFILLED_SYRINGE | INTRAVENOUS | Status: DC | PRN
  Administered 2023-02-04: 80 ug via INTRAVENOUS

## 2023-02-04 MED ORDER — PROPOFOL 500 MG/50ML IV EMUL
INTRAVENOUS | Status: DC | PRN
  Administered 2023-02-04: 25 ug/kg/min via INTRAVENOUS

## 2023-02-04 MED ORDER — PROPOFOL 10 MG/ML IV BOLUS
INTRAVENOUS | Status: DC | PRN
  Administered 2023-02-04: 200 mg via INTRAVENOUS

## 2023-02-04 MED ORDER — FENTANYL CITRATE (PF) 100 MCG/2ML IJ SOLN
25.0000 ug | INTRAMUSCULAR | Status: DC | PRN
  Administered 2023-02-04 (×3): 50 ug via INTRAVENOUS

## 2023-02-04 MED ORDER — GABAPENTIN 300 MG PO CAPS: 300.0000 mg | ORAL_CAPSULE | ORAL | Status: AC

## 2023-02-04 MED ORDER — OXYCODONE HCL 5 MG/5ML PO SOLN: 5.0000 mg | Freq: Once | ORAL | Status: AC | PRN

## 2023-02-04 MED ORDER — BUPIVACAINE HCL (PF) 0.25 % IJ SOLN
INTRAMUSCULAR | Status: AC
  Filled 2023-02-04: qty 30

## 2023-02-04 MED ORDER — SCOPOLAMINE 1 MG/3DAYS TD PT72
MEDICATED_PATCH | TRANSDERMAL | Status: AC
  Filled 2023-02-04: qty 1

## 2023-02-04 MED ORDER — ROCURONIUM BROMIDE 10 MG/ML (PF) SYRINGE
PREFILLED_SYRINGE | INTRAVENOUS | Status: AC
  Filled 2023-02-04: qty 10

## 2023-02-04 MED ORDER — CHLORHEXIDINE GLUCONATE 0.12 % MT SOLN: 15.0000 mL | Freq: Once | OROMUCOSAL | Status: AC

## 2023-02-04 MED ORDER — SCOPOLAMINE 1 MG/3DAYS TD PT72
MEDICATED_PATCH | TRANSDERMAL | Status: DC | PRN
  Administered 2023-02-04: 1 via TRANSDERMAL

## 2023-02-04 MED ORDER — PROMETHAZINE HCL 25 MG/ML IJ SOLN: 6.2500 mg | INTRAMUSCULAR | Status: DC | PRN

## 2023-02-04 MED ORDER — BUPIVACAINE LIPOSOME 1.3 % IJ SUSP
INTRAMUSCULAR | Status: DC | PRN
  Administered 2023-02-04: 50 mL

## 2023-02-04 MED ORDER — ROPIVACAINE HCL 5 MG/ML IJ SOLN
INTRAMUSCULAR | Status: AC
  Filled 2023-02-04: qty 30

## 2023-02-04 MED ORDER — ALUM & MAG HYDROXIDE-SIMETH 200-200-20 MG/5ML PO SUSP: 30.0000 mL | ORAL | Status: DC | PRN

## 2023-02-04 MED ORDER — GABAPENTIN 300 MG PO CAPS
ORAL_CAPSULE | ORAL | Status: AC
  Administered 2023-02-04: 300 mg via ORAL
  Filled 2023-02-04: qty 1

## 2023-02-04 MED ORDER — FENTANYL CITRATE (PF) 250 MCG/5ML IJ SOLN
INTRAMUSCULAR | Status: AC
  Filled 2023-02-04: qty 5

## 2023-02-04 MED ORDER — PANTOPRAZOLE SODIUM 40 MG PO TBEC
40.0000 mg | DELAYED_RELEASE_TABLET | Freq: Every day | ORAL | Status: DC
  Administered 2023-02-05: 40 mg via ORAL
  Filled 2023-02-04: qty 1

## 2023-02-04 MED ORDER — DEXAMETHASONE SODIUM PHOSPHATE 10 MG/ML IJ SOLN
INTRAMUSCULAR | Status: DC | PRN
  Administered 2023-02-04: 5 mg via INTRAVENOUS

## 2023-02-04 MED ORDER — STERILE WATER FOR IRRIGATION IR SOLN
Status: DC | PRN
  Administered 2023-02-04: 1000 mL via INTRAVESICAL

## 2023-02-04 MED ORDER — ONDANSETRON HCL 4 MG/2ML IJ SOLN
INTRAMUSCULAR | Status: DC | PRN
  Administered 2023-02-04: 4 mg via INTRAVENOUS

## 2023-02-04 MED ORDER — MORPHINE SULFATE (PF) 2 MG/ML IV SOLN
1.0000 mg | INTRAVENOUS | Status: DC | PRN
  Administered 2023-02-04: 2 mg via INTRAVENOUS
  Filled 2023-02-04: qty 1

## 2023-02-04 MED ORDER — SUGAMMADEX SODIUM 500 MG/5ML IV SOLN
INTRAVENOUS | Status: AC
  Filled 2023-02-04: qty 5

## 2023-02-04 MED ORDER — SIMETHICONE 80 MG PO CHEW: 80.0000 mg | CHEWABLE_TABLET | Freq: Four times a day (QID) | ORAL | Status: DC | PRN

## 2023-02-04 MED ORDER — ROCURONIUM BROMIDE 10 MG/ML (PF) SYRINGE
PREFILLED_SYRINGE | INTRAVENOUS | Status: DC | PRN
  Administered 2023-02-04: 20 mg via INTRAVENOUS
  Administered 2023-02-04: 50 mg via INTRAVENOUS

## 2023-02-04 SURGICAL SUPPLY — 71 items
ADH SKN CLS APL DERMABOND .7 (GAUZE/BANDAGES/DRESSINGS) ×2
APL SRG 38 LTWT LNG FL B (MISCELLANEOUS) ×2
APL SWBSTK 6 STRL LF DISP (MISCELLANEOUS) ×4
APPLICATOR ARISTA FLEXITIP XL (MISCELLANEOUS) IMPLANT
APPLICATOR COTTON TIP 6 STRL (MISCELLANEOUS) IMPLANT
APPLICATOR COTTON TIP 6IN STRL (MISCELLANEOUS) ×4
BARRIER ADHS 3X4 INTERCEED (GAUZE/BANDAGES/DRESSINGS) IMPLANT
BRR ADH 4X3 ABS CNTRL BYND (GAUZE/BANDAGES/DRESSINGS)
CATH FOLEY 3WAY  5CC 16FR (CATHETERS)
CATH FOLEY 3WAY 5CC 16FR (CATHETERS) ×2 IMPLANT
CELLS DAT CNTRL 66122 CELL SVR (MISCELLANEOUS) IMPLANT
COVER BACK TABLE 60X90IN (DRAPES) ×2 IMPLANT
COVER TIP SHEARS 8 DVNC (MISCELLANEOUS) ×2 IMPLANT
COVER TIP SHEARS 8MM DA VINCI (MISCELLANEOUS) ×2
DEFOGGER SCOPE WARMER CLEARIFY (MISCELLANEOUS) ×2 IMPLANT
DERMABOND ADVANCED .7 DNX12 (GAUZE/BANDAGES/DRESSINGS) ×2 IMPLANT
DILATOR CANAL MILEX (MISCELLANEOUS) ×2 IMPLANT
DRAPE ARM DVNC X/XI (DISPOSABLE) ×8 IMPLANT
DRAPE COLUMN DVNC XI (DISPOSABLE) ×2 IMPLANT
DRAPE DA VINCI XI ARM (DISPOSABLE) ×8
DRAPE DA VINCI XI COLUMN (DISPOSABLE) ×2
DRAPE SURG IRRIG POUCH 19X23 (DRAPES) IMPLANT
DRAPE UTILITY 15X26 TOWEL STRL (DRAPES) ×2 IMPLANT
DURAPREP 26ML APPLICATOR (WOUND CARE) ×2 IMPLANT
ELECT REM PT RETURN 9FT ADLT (ELECTROSURGICAL) ×2
ELECTRODE REM PT RTRN 9FT ADLT (ELECTROSURGICAL) ×2 IMPLANT
GAUZE 4X4 16PLY ~~LOC~~+RFID DBL (SPONGE) IMPLANT
GLOVE BIOGEL PI IND STRL 7.0 (GLOVE) ×12 IMPLANT
HEMOSTAT ARISTA ABSORB 3G PWDR (HEMOSTASIS) IMPLANT
HIBICLENS CHG 4% 4OZ BTL (MISCELLANEOUS) ×2 IMPLANT
IRRIGATION STRYKERFLOW (MISCELLANEOUS) ×2 IMPLANT
IRRIGATOR STRYKERFLOW (MISCELLANEOUS) ×2
KIT PINK PAD W/HEAD ARE REST (MISCELLANEOUS) ×2
KIT PINK PAD W/HEAD ARM REST (MISCELLANEOUS) IMPLANT
LEGGING LITHOTOMY PAIR STRL (DRAPES) ×2 IMPLANT
NDL 18GX1X1/2 (RX/OR ONLY) (NEEDLE) IMPLANT
NEEDLE 18GX1X1/2 (RX/OR ONLY) (NEEDLE) ×2 IMPLANT
OBTURATOR OPTICAL STANDARD 8MM (TROCAR)
OBTURATOR OPTICAL STND 8 DVNC (TROCAR)
OBTURATOR OPTICALSTD 8 DVNC (TROCAR) IMPLANT
OCCLUDER COLPOPNEUMO (BALLOONS) ×2 IMPLANT
PACK ROBOT WH (CUSTOM PROCEDURE TRAY) ×2 IMPLANT
PACK ROBOTIC GOWN (GOWN DISPOSABLE) ×2 IMPLANT
PAD OB MATERNITY 4.3X12.25 (PERSONAL CARE ITEMS) ×2 IMPLANT
PROTECTOR NERVE ULNAR (MISCELLANEOUS) ×2 IMPLANT
RETRACTOR WND ALEXIS 18 MED (MISCELLANEOUS) IMPLANT
RTRCTR WOUND ALEXIS 18CM MED (MISCELLANEOUS)
SCISSORS LAP 5X45 EPIX DISP (ENDOMECHANICALS) IMPLANT
SEAL CANN UNIV 5-8 DVNC XI (MISCELLANEOUS) ×8 IMPLANT
SEAL XI 5MM-8MM UNIVERSAL (MISCELLANEOUS) ×8
SEALER VESSEL DA VINCI XI (MISCELLANEOUS) ×2
SEALER VESSEL EXT DVNC XI (MISCELLANEOUS) IMPLANT
SET IRRIG Y TYPE TUR BLADDER L (SET/KITS/TRAYS/PACK) IMPLANT
SET TRI-LUMEN FLTR TB AIRSEAL (TUBING) IMPLANT
SPIKE FLUID TRANSFER (MISCELLANEOUS) ×4 IMPLANT
SUT VIC AB 0 CT1 27 (SUTURE) ×4
SUT VIC AB 0 CT1 27XBRD ANBCTR (SUTURE) ×4 IMPLANT
SUT VICRYL 0 UR6 27IN ABS (SUTURE) IMPLANT
SUT VICRYL RAPIDE 4/0 PS 2 (SUTURE) ×6 IMPLANT
SUT VLOC 180 0 9IN  GS21 (SUTURE) ×2
SUT VLOC 180 0 9IN GS21 (SUTURE) ×2 IMPLANT
SYR 30ML LL (SYRINGE) IMPLANT
TIP RUMI ORANGE 6.7MMX12CM (TIP) IMPLANT
TIP UTERINE 5.1X6CM LAV DISP (MISCELLANEOUS) IMPLANT
TIP UTERINE 6.7X10CM GRN DISP (MISCELLANEOUS) IMPLANT
TIP UTERINE 6.7X6CM WHT DISP (MISCELLANEOUS) IMPLANT
TIP UTERINE 6.7X8CM BLUE DISP (MISCELLANEOUS) IMPLANT
TOWEL GREEN STERILE (TOWEL DISPOSABLE) ×2 IMPLANT
TROCAR PORT AIRSEAL 8X120 (TROCAR) ×2 IMPLANT
UNDERPAD 30X36 HEAVY ABSORB (UNDERPADS AND DIAPERS) ×2 IMPLANT
WATER STERILE IRR 1000ML POUR (IV SOLUTION) ×2 IMPLANT

## 2023-02-04 NOTE — Op Note (Signed)
02/04/2023  11:54 AM  PATIENT:  Debbie [name]  42 y.o. female  PRE-OPERATIVE DIAGNOSIS:  Dysmenorrhea  Menorrhagia with Regular Cycle Porhyria  POST-OPERATIVE DIAGNOSIS:  Dysmenorrhea Menorrhagia with Regular CyclePorhyria  PROCEDURE:  Procedure(s): XI ROBOTIC ASSISTED LAPAROSCOPIC HYSTERECTOMY AND BILATERAL SALPINGO-OOPHORECTOMY (Bilateral) XI ROBOTIC ASSISTED LAPAROSCOPIC LYSIS OF ADHESION  SURGEON:  Surgeon(s) and Role:    Christophe Louis, MD - Primary  PHYSICIAN ASSISTANT:   ASSISTANTS: {ASSISTANTS:31801}   ANESTHESIA:   {procedures; anesthesia:812}  EBL:  20 mL   BLOOD ADMINISTERED:{BLOOD GIVEN TYPES AND AMOUNTS:20467}  DRAINS: {Devices; drains:31758}   LOCAL MEDICATIONS USED:  {LOCAL MEDICATIONS:10721995}  SPECIMEN:  {ONC STAGING; AJCC TYPE OF SPECIMEN:115600001}  DISPOSITION OF SPECIMEN:  {SPECIMEN DISPOSITION:204680}  COUNTS:  {OR COUNTS CORRECT/INCORRECT:204690}  TOURNIQUET:  * No tourniquets in log *  DICTATION: .{DICTATION DK:7951610  PLAN OF CARE: {OPTIME PLAN OF PX:2023907  PATIENT DISPOSITION:  {op note disposition:31782}   Delay start of Pharmacological VTE agent (>24hrs) due to surgical blood loss or risk of bleeding: {YES/NO/NOT APPLICABLE:20182}   Procedure: The patient was taken to the operating room where she was placed under general anesthesia.Time out was performed. Marland Kitchen She was placed in dorsal lithotomy position and prepped and draped in the usual sterile fashion. A weighted speculum was placed into the vagina. A Deaver was placed anteriorly for retraction. The anterior lip of the cervix was grasped with a single-tooth tenaculum. The vaginal mucosa was injected with 2.5 cc of ropivacaine at the 2/4/ 8 and 10 o'clock positions. The uterus was sounded to 8 cm. the cervix was dilated to 6 mm . 0 vicryl suture placed at the 12 and 6:00 positions Of the cervix to facilitate placement of a Ru mi uterine manipulator. The manipulator was placed  without difficulty. Weighted speculum and Deaver were removed .  Attention was turned to the patient's abdomen where a 8 mm trocar was placed 2 cm above the umbilicus. under direct visualization . The pneumoperitoneum was achieved with PCO2 gas. The laparoscope was removed. 60 cc of ropivacaine were injected into the abdominal cavity. The laparoscope was reinserted. An 8 mm trocar was placed in the right upper quadrant 16 centimeters from the umbilicus.later connected to robotic arm #4). An 8MM incision was made in the Right upper quadrant TROCAR WAS PLACED 8 cm from the umbilicus. Later connected to robotic arm #3. An 8 mm incision was made in the left upper quadrant 16 cm from the umbilicus and connected to robot arm #1. Marland Kitchen Attention was turned to the left upper quadrant where a 8 mm midclavicular assistant trocar was placed. ( All incision sites were injected with 10cc of ropivacaine prior to port placement. )  Once all ports had been placed under direct visualization.The laparoscope was removed and the Fountain robotic system was thin right-sided docked. The robotic arms were connected to the corresponding trocars as listed above. The laparoscope was then reinserted. The long tip bipolar forceps were placed into port #1. The monopolar scissor placed in the port #4. A vessel sealerwas placed in port #3. All instruments were directed into the pelvis under direct visualization.  Attention was turned to the surgeons console.. The left mesosalpinx and left utero-ovarian ligament was cauterized and transected with the vessel sealer The broad ligament was cauterized and transected with the vessel sealer .The round ligament was cauterized and transected with the vessel sealer  The anterior leaf of broad ligament was incised along the bladder reflection to the midline.  The right  mesosalpinx and right utero-ovarian ligament was cauterized and transected with the vessel sealer. The right broad ligament was cauterized  and transected with the vessel sealer. The right round ligament was cauterized and transected with the vessel sealer The broad ligament was incised to the midline. The bladder was dissected off the lower uterine segments of the cervix via sharp and blunt dissection.   The uterine arteries were skeleton bilaterally. They were  cauterized and transected with the vessel sealer The KOH ring was identified. The anterior colpotomy was performed followed by the posterior colpotomy. Once the uterus,cervix and bilateral fallopian tubes were completely excised was removed through the vagina. The  bipolar forceps and scissors were removed and log tip forceps were placed in the port #1 and the cutting needle driver was placed in to port #3.  The vaginal cuff was closed with running suture if 0 v-lock. The pelvis was irrigated. Marland KitchenMarland KitchenMarland KitchenExcellent hemostasis was noted. Arista was placed along the vaginal cuff.  All pelvic pedicles were examined and hemostasis was noted.  All instruments removed from the ports. All ports were removed under direct Visualization. The pneumoperitoneum was released. The skin incisions were closed with 4-0 Vicryl and then covered with Derma bond.     Sponge lap and needle counts weIre correct x. The patient was awakened from anesthesia and taken to the recovery room in stable condition.

## 2023-02-04 NOTE — Anesthesia Postprocedure Evaluation (Signed)
Anesthesia Post Note  Patient: Debbie [name]  Procedure(s) Performed: XI ROBOTIC ASSISTED LAPAROSCOPIC HYSTERECTOMY AND BILATERAL SALPINGO-OOPHORECTOMY (Bilateral: Abdomen) XI ROBOTIC ASSISTED LAPAROSCOPIC LYSIS OF ADHESION (Abdomen)     Patient location during evaluation: PACU Anesthesia Type: General Level of consciousness: awake and alert Pain management: pain level controlled Vital Signs Assessment: post-procedure vital signs reviewed and stable Respiratory status: spontaneous breathing, nonlabored ventilation, respiratory function stable and patient connected to nasal cannula oxygen Cardiovascular status: blood pressure returned to baseline and stable Postop Assessment: no apparent nausea or vomiting Anesthetic complications: no   No notable events documented.  Last Vitals:  Vitals:   02/04/23 1245 02/04/23 1300  BP: 122/82 126/74  Pulse: 69 71  Resp: 15 12  Temp:    SpO2: 99% 98%                    Audry Pili

## 2023-02-04 NOTE — Transfer of Care (Signed)
Immediate Anesthesia Transfer of Care Note  Patient: Debbie [name]  Procedure(s) Performed: XI ROBOTIC ASSISTED LAPAROSCOPIC HYSTERECTOMY AND BILATERAL SALPINGO-OOPHORECTOMY (Bilateral: Abdomen) XI ROBOTIC ASSISTED LAPAROSCOPIC LYSIS OF ADHESION (Abdomen)  Patient Location: PACU  Anesthesia Type:General  Level of Consciousness: drowsy and patient cooperative  Airway & Oxygen Therapy: Patient Spontanous Breathing and Patient connected to face mask oxygen  Post-op Assessment: Report given to RN and Post -op Vital signs reviewed and stable  Post vital signs: Reviewed and stable  Last Vitals:  Vitals Value Taken Time  BP 127/78 02/04/23 1153  Temp    Pulse 85 02/04/23 1158  Resp 14 02/04/23 1158  SpO2 98 % 02/04/23 1158  Vitals shown include unvalidated device data.  Last Pain:  Vitals:   02/04/23 0723  TempSrc:   PainSc: 7       Patients Stated Pain Goal: 2 ([id number])  Complications: No notable events documented.

## 2023-02-04 NOTE — H&P (Signed)
Date of Initial H&P: 02/03/2023  History reviewed, patient examined, no change in status, stable for surgery.

## 2023-02-04 NOTE — Anesthesia Procedure Notes (Signed)
Procedure Name: Intubation Date/Time: 02/04/2023 8:42 AM  Performed by: Janene Harvey, CRNAPre-anesthesia Checklist: Patient identified, Emergency Drugs available, Suction available and Patient being monitored Patient Re-evaluated:Patient Re-evaluated prior to induction Oxygen Delivery Method: Circle system utilized Preoxygenation: Pre-oxygenation with 100% oxygen Induction Type: IV induction Ventilation: Mask ventilation without difficulty Laryngoscope Size: Mac and 4 Grade View: Grade I Tube type: Oral Tube size: 7.0 mm Number of attempts: 1 Airway Equipment and Method: Stylet and Oral airway Placement Confirmation: ETT inserted through vocal cords under direct vision, positive ETCO2 and breath sounds checked- equal and bilateral Secured at: 21 cm Tube secured with: Tape Dental Injury: Teeth and Oropharynx as per pre-operative assessment

## 2023-02-04 NOTE — Progress Notes (Signed)
Notified Anderson Malta, CRNA that patient had T of 99.4 this am @ 0650.  She stated she will monitor.

## 2023-02-05 ENCOUNTER — Encounter (HOSPITAL_COMMUNITY): Payer: Self-pay | Admitting: Obstetrics and Gynecology

## 2023-02-05 DIAGNOSIS — D259 Leiomyoma of uterus, unspecified: Secondary | ICD-10-CM | POA: Diagnosis not present

## 2023-02-05 LAB — HEMOGLOBIN: Hemoglobin: 11.5 g/dL — ABNORMAL LOW (ref 12.0–15.0)

## 2023-02-05 MED ORDER — OXYCODONE HCL 5 MG PO TABS: 5.0000 mg | ORAL_TABLET | Freq: Four times a day (QID) | ORAL | 0 refills | Status: DC | PRN

## 2023-02-05 MED ORDER — IBUPROFEN 600 MG PO TABS: 600.0000 mg | ORAL_TABLET | Freq: Four times a day (QID) | ORAL | 1 refills | Status: AC | PRN

## 2023-02-05 NOTE — Discharge Summary (Signed)
Physician Discharge Summary  Patient ID: Debbie [name] MRN: [id number] DOB/AGE: 41-Apr-1982 41 y.o.  Admit date: 02/04/2023 Discharge date: 02/05/2023  Admission Diagnoses: Menorrhagia   Discharge Diagnoses:  Principal Problem:   Menorrhagia Active Problems:   S/P laparoscopic hysterectomy   Discharged Condition: stable  Hospital Course: pt was admitted for observation after undergoing a robotic assisted laparoscopic hysterectomy with bilateral salpingo-oophorectomy. She did well postoperatively with return of bowel and bladder function. She is ambulating pod #0 and tolerating po. Pain is well controlled with oral medications   Consults: None  Significant Diagnostic Studies: labs: hgb pod #1 11  Treatments: TPN and surgery: robotic assisted laparoscopic hysterectomy with bilateral salpingo-oophorectomy   Discharge Exam: Blood pressure (!) 92/53, pulse (!) 59, temperature 99 F (37.2 C), resp. rate 18, height '5\' 4"'$  (1.626 m), weight 106.6 kg, SpO2 98 %, currently breastfeeding. General appearance: alert, cooperative, and no distress Resp: no distress  Abdomen soft appropriately tender nondistended  Incisions well approximated no erythema or exudate.  Ext no evidence of  edema, normal range of motion.   Disposition: Discharge disposition: 01-Home or Self Care       Discharge Instructions     Call MD for:  persistant nausea and vomiting   Complete by: As directed    Call MD for:  redness, tenderness, or signs of infection (pain, swelling, redness, odor or green/yellow discharge around incision site)   Complete by: As directed    Call MD for:  severe uncontrolled pain   Complete by: As directed    Call MD for:  temperature >100.4   Complete by: As directed    Diet general   Complete by: As directed    Driving Restrictions   Complete by: As directed    Avoid driving for 1 week   Increase activity slowly   Complete by: As directed    Lifting restrictions   Complete by:  As directed    Avoid lifting over 10 lbs   May shower / Bathe   Complete by: As directed    May walk up steps   Complete by: As directed    No wound care   Complete by: As directed    Sexual Activity Restrictions   Complete by: As directed    Avoid sexual activity for 6 weeks and until approved by Dr. Landry Mellow      Allergies as of 02/05/2023       Reactions   Tylenol [acetaminophen] Anaphylaxis   Aspirin Hives   Unknown childhood rxn   Benadryl [diphenhydramine Hcl] Itching   Buspar [buspirone] Other (See Comments)   migraines   Celexa [citalopram Hydrobromide] Other (See Comments)   agitation and dizziness   Dilaudid [hydromorphone Hcl] Itching   Pill only   Effexor Xr [venlafaxine]    Other reaction(s): More depressed (11/2017-neurology)   Penicillins    hives   Prednisone    Swelling, hives   Sulfamethoxazole-trimethoprim    Other reaction(s): swelling (2017)   Zoloft [sertraline]    Other reaction(s): nausea and swelling   Latex Rash        Medication List     TAKE these medications    Botox 200 units injection Generic drug: botulinum toxin Type A Inject 155 Units into the muscle every 3 (three) months. Inject 155units to head and neck intramuscularly every 3 months.   esomeprazole 20 MG capsule Commonly known as: NEXIUM Take 20 mg by mouth daily at 12 noon.   ibuprofen 600 MG tablet  Commonly known as: ADVIL Take 1 tablet (600 mg total) by mouth every 6 (six) hours as needed. What changed:  medication strength how much to take when to take this reasons to take this   oxyCODONE 5 MG immediate release tablet Commonly known as: Oxy IR/ROXICODONE Take 1-2 tablets (5-10 mg total) by mouth every 6 (six) hours as needed for moderate pain.   SUMAtriptan 6 MG/0.5ML Sosy injection Commonly known as: IMITREX Inject 0.5 mLs (6 mg total) into the skin every 2 (two) hours as needed for migraine or headache.   tiZANidine 4 MG tablet Commonly known as:  Zanaflex Take 1 tablet (4 mg total) by mouth at bedtime.        Follow-up Information     Christophe Louis, MD. Go in 2 week(s).   Specialty: Obstetrics and Gynecology Contact information: [id number] [place] [address] [phone]                 Signed: Christophe Louis 02/05/2023, 1:29 PM

## 2023-02-05 NOTE — Progress Notes (Signed)
Patient up and ambulated by tech down hall. No new complaints at this time. Instructed patient to continue ambulation without assistance as tolerated.

## 2023-02-09 LAB — LIPOPROTEIN A (LPA): Lipoprotein (a): 72.2 nmol/L (ref <75.0)

## 2023-02-09 LAB — LIPID PANEL
Chol/HDL Ratio: 4.7 ratio — ABNORMAL HIGH (ref 0.0–4.4)
Cholesterol, Total: 160 mg/dL (ref 100–199)
HDL: 34 mg/dL — ABNORMAL LOW (ref >39)
LDL Chol Calc (NIH): 95 mg/dL (ref 0–99)
Triglycerides: 178 mg/dL — ABNORMAL HIGH (ref 0–149)
VLDL Cholesterol Cal: 31 mg/dL (ref 5–40)

## 2023-02-10 ENCOUNTER — Ambulatory Visit: Payer: BC Managed Care – PPO | Attending: Cardiology | Admitting: Cardiology

## 2023-02-10 ENCOUNTER — Encounter: Payer: Self-pay | Admitting: Cardiology

## 2023-02-10 VITALS — BP 110/74 | HR 74 | Ht 64.0 in | Wt 233.2 lb

## 2023-02-10 DIAGNOSIS — E782 Mixed hyperlipidemia: Secondary | ICD-10-CM

## 2023-02-10 NOTE — Progress Notes (Addendum)
[facility]io-Obstetrics Clinic  Follow Up Note   Date:  02/10/2023   ID:  Debbie [name], DOB 1981-10-29, MRN [id number]  PCP:  Leeroy Cha, [facility] Providers Cardiologist:  Berniece Salines, DO  Electrophysiologist:  None        Referring MD: Leeroy Cha,*   Chief Complaint: " I am planning the surgery"  History of Present Illness:    Debbie [name] is a 42 y.o. female [id number] who returns for follow up of severe preeclampsia here today for follow-up visit.  At the last visit she she has been EGD which went well.  But since I saw the patient she has had a total hysterectomy.  She offers no specific complaints at this time.  She is here with her son and her mother.  Prior CV Studies Reviewed: The following studies were reviewed today: Zio monitor Patch Wear Time:  13 days and 15 hours September 16, 2021 Indication: Palpitation   Patient had a minimum HR of 61 bpm, maximum HR of 147 bpm, and average HR of 89 bpm.   Predominant underlying rhythm was Sinus Rhythm.   Premature atrial complexes were rare (<1.0%). Premature ventricular complexes were rare (<1.0%).   Symptoms associated with sinus rhythm.   Conclusion: Normal/unremarkable study with no evidence of significant arrhythmia.   TTE 09/20/2021 IMPRESSIONS   1. Left ventricular ejection fraction, by estimation, is 60 to 65%. The left ventricle has normal function. The left ventricle has no regional wall motion abnormalities. Left ventricular diastolic parameters were  normal. The average left ventricular global longitudinal strain is -22.7 %. The global longitudinal strain is  normal.   2. Right ventricular systolic function is normal. The right ventricular size is normal.   3. The mitral valve is normal in structure. No evidence of mitral valve regurgitation. No evidence of mitral stenosis.   4. The aortic valve is normal in structure. Aortic valve regurgitation is not visualized. No  aortic stenosis is present.   5. The inferior vena cava is normal in size with greater than 50% respiratory variability, suggesting right atrial pressure of 3 mmHg.   Comparison(s): No prior Echocardiogram   Past Medical History:  Diagnosis Date   Anxiety    self reported   Arthritis    in spine   Chronic back pain 2021   low back/hip; had nerve ablation in lumbar/sacral joint   Depression    self reported   History of hiatal hernia    Hypertension    Maxillary sinus cyst    left   Migraine    Multiple thyroid nodules    3; 1.9 cm and 2 cm, 0.6 cm, will see general  surgeon   No pertinent past medical history    Ovarian cyst    Porphyria ([place])    UTI (urinary tract infection)     Past Surgical History:  Procedure Laterality Date   BIOPSY  08/27/2022   Procedure: BIOPSY;  Surgeon: Arta Silence, MD;  Location: [facility];  Service: Gastroenterology;;   CESAREAN SECTION  2013   CESAREAN SECTION N/A 11/26/2021   Procedure: REPEAT CESAREAN SECTION;  Surgeon: Drema Dallas, DO;  Location: [facility];  Service: Obstetrics;  Laterality: N/A;   ESOPHAGOGASTRODUODENOSCOPY N/A 08/27/2022   Procedure: ESOPHAGOGASTRODUODENOSCOPY (EGD);  Surgeon: Arta Silence, MD;  Location: [facility];  Service: Gastroenterology;  Laterality: N/A;   laparscopy  2012   ruptured ectopic   radiofrequency ablation of lumbar spine N/A 08/07/2020   ROBOTIC ASSISTED  LAPAROSCOPIC HYSTERECTOMY AND SALPINGECTOMY Bilateral 02/04/2023   Procedure: XI ROBOTIC ASSISTED LAPAROSCOPIC HYSTERECTOMY AND BILATERAL SALPINGO-OOPHORECTOMY;  Surgeon: Christophe Louis, MD;  Location: [facility];  Service: Gynecology;  Laterality: Bilateral;   ROBOTIC ASSISTED LAPAROSCOPIC LYSIS OF ADHESION  02/04/2023   Procedure: XI ROBOTIC ASSISTED LAPAROSCOPIC LYSIS OF ADHESION;  Surgeon: Christophe Louis, MD;  Location: [facility];  Service: Gynecology;;   SHOULDER CAPSULORRHAPHY Right    TUBAL LIGATION     fallopian tubes removed   WISDOM TOOTH  EXTRACTION        OB History     Gravida  3   Para  2   Term  1   Preterm  1   AB  1   Living  2      SAB  0   IAB      Ectopic  1   Multiple  0   Live Births  2        Obstetric Comments  1-breech             Current Medications: Current Meds  Medication Sig   botulinum toxin Type A (BOTOX) 200 units injection Inject 155 Units into the muscle every 3 (three) months. Inject 155units to head and neck intramuscularly every 3 months.   esomeprazole (NEXIUM) 20 MG capsule Take 20 mg by mouth daily at 12 noon.   ibuprofen (ADVIL) 600 MG tablet Take 1 tablet (600 mg total) by mouth every 6 (six) hours as needed.   pantoprazole (PROTONIX) 40 MG tablet Take 40 mg by mouth daily.   tiZANidine (ZANAFLEX) 4 MG tablet Take 1 tablet (4 mg total) by mouth at bedtime.     Allergies:   Tylenol [acetaminophen], Aspirin, Benadryl [diphenhydramine hcl], Buspar [buspirone], Celexa [citalopram hydrobromide], Dilaudid [hydromorphone hcl], Effexor xr [venlafaxine], Penicillins, Prednisone, Sulfamethoxazole-trimethoprim, Zoloft [sertraline], and Latex   Social History   Socioeconomic History   Marital status: Married    Spouse name: Not on file   Number of children: 1   Years of education: Not on file   Highest education level: Bachelor's degree (e.g., BA, AB, BS)  Occupational History   Not on file  Tobacco Use   Smoking status: Never   Smokeless tobacco: Never  Vaping Use   Vaping Use: Never used  Substance and Sexual Activity   Alcohol use: Not Currently   Drug use: No   Sexual activity: Not Currently    Birth control/protection: None  Other Topics Concern   Not on file  Social History Narrative   Lives at home with her son & his father   Right handed   Drinks 1 cup of caffeine daily   Social Determinants of Health   Financial Resource Strain: Not on file  Food Insecurity: No Food Insecurity (11/05/2021)   Hunger Vital Sign    Worried About Running Out of  Food in the Last Year: Never true    [place] in the Last Year: Never true  Transportation Needs: Unknown (11/05/2021)   PRAPARE - Hydrologist (Medical): No    Lack of Transportation (Non-Medical): Not on file  Physical Activity: Not on file  Stress: Not on file  Social Connections: Not on file      Family History  Problem Relation Age of Onset   Hypertension Mother    Heart disease Father    Diabetes Father    Dementia Father    Cerebral aneurysm Sister    Migraines Sister  Seizures Sister    Heart disease Maternal Grandfather    Heart disease Other        mother's side   High Cholesterol Other        mother's side       ROS:   Please see the history of present illness.    Chest discomfort but All other systems reviewed and are negative.   Labs/EKG Reviewed:    EKG:   EKG is was not ordered today.    Recent Labs: 02/02/2023: BUN 7; Creatinine, Ser 0.93; Platelets 346; Potassium 4.5; Sodium 137 02/05/2023: Hemoglobin 11.5   Recent Lipid Panel Lab Results  Component Value Date/Time   CHOL 160 02/06/2023 08:12 AM   TRIG 178 (H) 02/06/2023 08:12 AM   HDL 34 (L) 02/06/2023 08:12 AM   CHOLHDL 4.7 (H) 02/06/2023 08:12 AM   LDLCALC 95 02/06/2023 08:12 AM    Physical Exam:    VS:  BP 110/74   Pulse 74   Ht 5\' 4"  (1.626 m)   Wt 105.8 kg   SpO2 97%   BMI 40.03 kg/m     Wt Readings from Last 3 Encounters:  02/10/23 105.8 kg  02/04/23 106.6 kg  02/02/23 106.6 kg     GEN:  Well nourished, well developed in no acute distress HEENT: Normal NECK: No JVD; No carotid bruits LYMPHATICS: No lymphadenopathy CARDIAC: RRR, no murmurs, rubs, gallops RESPIRATORY:  Clear to auscultation without rales, wheezing or rhonchi  ABDOMEN: Soft, non-tender, non-distended MUSCULOSKELETAL:  No edema; No deformity  SKIN: Warm and dry NEUROLOGIC:  Alert and oriented x 3 PSYCHIATRIC:  Normal affect    Risk Assessment/Risk Calculators:                  ASSESSMENT & PLAN:    History of preeclampsia Hypertriglyceridemia Obesity   Her blood pressure is at target this is related.   Reviewed her lipid profile which has improved since the last time total cholesterol 160, triglyceride 178, HDL 38, LDL 95. LP(a) 72.   The patient understands the need to lose weight with diet and exercise. We have discussed specific strategies for this.  Patient Instructions  Medication Instructions:  Your physician recommends that you continue on your current medications as directed. Please refer to the Current Medication list given to you today. *If you need a refill on your cardiac medications before your next appointment, please call your pharmacy*   Lab Work: Your physician recommends that you have labs drawn today: Lipids If you have labs (blood work) drawn today and your tests are completely normal, you will receive your results only by: MyChart Message (if you have MyChart) OR A paper copy in the mail If you have any lab test that is abnormal or we need to change your treatment, we will call you to review the results.   Testing/Procedures: None   Follow-Up: At [facility], you and your health needs are our priority.  As part of our continuing mission to provide you with exceptional heart care, we have created designated Provider Care Teams.  These Care Teams include your primary Cardiologist (physician) and Advanced Practice Providers (APPs -  Physician Assistants and Nurse Practitioners) who all work together to provide you with the care you need, when you need it.   Your next appointment:   1 year(s)  Provider:   Berniece Salines, DO    Dispo:  No follow-ups on file.   Medication Adjustments/Labs and Tests Ordered: Current medicines are  reviewed at length with the patient today.  Concerns regarding medicines are outlined above.  Tests Ordered: Orders Placed This Encounter  Procedures   Lipid panel    Medication Changes: No orders of the defined types were placed in this encounter.

## 2023-02-10 NOTE — Patient Instructions (Signed)
Medication Instructions:  Your physician recommends that you continue on your current medications as directed. Please refer to the Current Medication list given to you today. *If you need a refill on your cardiac medications before your next appointment, please call your pharmacy*   Lab Work: Your physician recommends that you have labs drawn today: Lipids If you have labs (blood work) drawn today and your tests are completely normal, you will receive your results only by: MyChart Message (if you have MyChart) OR A paper copy in the mail If you have any lab test that is abnormal or we need to change your treatment, we will call you to review the results.   Testing/Procedures: None   Follow-Up: At [facility], you and your health needs are our priority.  As part of our continuing mission to provide you with exceptional heart care, we have created designated Provider Care Teams.  These Care Teams include your primary Cardiologist (physician) and Advanced Practice Providers (APPs -  Physician Assistants and Nurse Practitioners) who all work together to provide you with the care you need, when you need it.   Your next appointment:   1 year(s)  Provider:   Berniece Salines, DO

## 2023-02-17 LAB — SURGICAL PATHOLOGY

## 2023-02-21 ENCOUNTER — Other Ambulatory Visit: Payer: Self-pay | Admitting: Family Medicine

## 2023-02-26 ENCOUNTER — Encounter (HOSPITAL_COMMUNITY): Payer: Self-pay | Admitting: Emergency Medicine

## 2023-02-26 ENCOUNTER — Emergency Department (HOSPITAL_COMMUNITY)
Admission: EM | Admit: 2023-02-26 | Discharge: 2023-02-27 | Discharge status: Against Medical Advice | Payer: BC Managed Care – PPO | Attending: Student | Admitting: Student

## 2023-02-26 DIAGNOSIS — R42 Dizziness and giddiness: Secondary | ICD-10-CM | POA: Insufficient documentation

## 2023-02-26 DIAGNOSIS — Z5321 Procedure and treatment not carried out due to patient leaving prior to being seen by health care provider: Secondary | ICD-10-CM | POA: Diagnosis not present

## 2023-02-26 DIAGNOSIS — N939 Abnormal uterine and vaginal bleeding, unspecified: Secondary | ICD-10-CM | POA: Diagnosis not present

## 2023-02-26 DIAGNOSIS — R6883 Chills (without fever): Secondary | ICD-10-CM | POA: Diagnosis not present

## 2023-02-26 DIAGNOSIS — R109 Unspecified abdominal pain: Secondary | ICD-10-CM | POA: Insufficient documentation

## 2023-02-26 DIAGNOSIS — R519 Headache, unspecified: Secondary | ICD-10-CM | POA: Insufficient documentation

## 2023-02-26 DIAGNOSIS — R110 Nausea: Secondary | ICD-10-CM | POA: Diagnosis not present

## 2023-02-26 LAB — CBG MONITORING, ED: Glucose-Capillary: 124 mg/dL — ABNORMAL HIGH (ref 70–99)

## 2023-02-26 MED ORDER — OXYCODONE HCL 5 MG PO TABS: 5.0000 mg | ORAL_TABLET | Freq: Once | ORAL | Status: DC

## 2023-02-26 NOTE — ED Provider Triage Note (Signed)
Emergency Medicine Provider Triage Evaluation Note  Debbie [name] , a 42 y.o. female  was evaluated in triage.  Pt complains of an acute porphyria episode that began tonight, describes this as a sharp abdominal pain, nausea, lightheadedness, chills, and headache. Tried juice/bread at home without relief. Had a similar episode last night. Reports RATLH, BS 02/05/23- no problems with vaginal bleeding since. Denies vomiting, dysuria, syncope, dyspnea or chest pain.   Review of Systems  Per above  Physical Exam  BP 106/82 (BP Location: Right Arm)   Pulse 97   Temp 99.1 F (37.3 C) (Oral)   Resp 18   SpO2 98%  Gen:   Awake, no distress   Resp:  Normal effort  MSK:   Moves extremities without difficulty  Other:  No peritoneal signs on abdominal exam.   Medical Decision Making  Medically screening exam initiated at 11:30 PM.  Appropriate orders placed.  Debbie [name] was informed that the remainder of the evaluation will be completed by another provider, this initial triage assessment does not replace that evaluation, and the importance of remaining in the ED until their evaluation is complete.  Abdominal pain.    Debbie [name], [place] 02/26/23 2332

## 2023-02-26 NOTE — ED Triage Notes (Signed)
Pt had hysterectomy a while ago and still struggling with intermittant pain. She had an attack tonight with sharp pains and felt like she would pass out. Headaches and chills. Denies vaginal bleeding since. Urinating WNL.

## 2023-02-26 NOTE — ED Notes (Signed)
Pt told tech she feels good enough to go home and would like to be discharged. Stomach not hurting any more.

## 2023-02-27 LAB — URINALYSIS, W/ REFLEX TO CULTURE (INFECTION SUSPECTED)
Bacteria, UA: NONE SEEN
Bilirubin Urine: NEGATIVE
Glucose, UA: NEGATIVE mg/dL
Ketones, ur: NEGATIVE mg/dL
Leukocytes,Ua: NEGATIVE
Nitrite: NEGATIVE
Protein, ur: NEGATIVE mg/dL
Specific Gravity, Urine: 1.01 (ref 1.005–1.030)
pH: 5 (ref 5.0–8.0)

## 2023-02-27 LAB — CBC WITH DIFFERENTIAL/PLATELET
Abs Immature Granulocytes: 0.02 10*3/uL (ref 0.00–0.07)
Basophils Absolute: 0.1 10*3/uL (ref 0.0–0.1)
Basophils Relative: 1 %
Eosinophils Absolute: 0.2 10*3/uL (ref 0.0–0.5)
Eosinophils Relative: 2 %
HCT: 41.1 % (ref 36.0–46.0)
Hemoglobin: 13.4 g/dL (ref 12.0–15.0)
Immature Granulocytes: 0 %
Lymphocytes Relative: 46 %
Lymphs Abs: 4.9 10*3/uL — ABNORMAL HIGH (ref 0.7–4.0)
MCH: 27.2 pg (ref 26.0–34.0)
MCHC: 32.6 g/dL (ref 30.0–36.0)
MCV: 83.5 fL (ref 80.0–100.0)
Monocytes Absolute: 0.7 10*3/uL (ref 0.1–1.0)
Monocytes Relative: 6 %
Neutro Abs: 4.8 10*3/uL (ref 1.7–7.7)
Neutrophils Relative %: 45 %
Platelets: 305 10*3/uL (ref 150–400)
RBC: 4.92 MIL/uL (ref 3.87–5.11)
RDW: 13.4 % (ref 11.5–15.5)
WBC: 10.6 10*3/uL — ABNORMAL HIGH (ref 4.0–10.5)
nRBC: 0 % (ref 0.0–0.2)

## 2023-02-27 LAB — LIPASE, BLOOD: Lipase: 32 U/L (ref 11–51)

## 2023-02-27 LAB — COMPREHENSIVE METABOLIC PANEL
ALT: 20 U/L (ref 0–44)
AST: 22 U/L (ref 15–41)
Albumin: 3.8 g/dL (ref 3.5–5.0)
Alkaline Phosphatase: 61 U/L (ref 38–126)
Anion gap: 12 (ref 5–15)
BUN: 11 mg/dL (ref 6–20)
CO2: 24 mmol/L (ref 22–32)
Calcium: 9.4 mg/dL (ref 8.9–10.3)
Chloride: 103 mmol/L (ref 98–111)
Creatinine, Ser: 0.95 mg/dL (ref 0.44–1.00)
GFR, Estimated: >60 mL/min (ref >60)
Glucose, Bld: 122 mg/dL — ABNORMAL HIGH (ref 70–99)
Potassium: 4.1 mmol/L (ref 3.5–5.1)
Sodium: 139 mmol/L (ref 135–145)
Total Bilirubin: 0.4 mg/dL (ref 0.3–1.2)
Total Protein: 7.1 g/dL (ref 6.5–8.1)

## 2023-02-27 NOTE — ED Notes (Signed)
PT stated she no longer wanted to wait and left ED with her partner

## 2023-03-03 ENCOUNTER — Other Ambulatory Visit: Payer: Self-pay

## 2023-03-06 ENCOUNTER — Other Ambulatory Visit: Payer: Self-pay

## 2023-03-09 ENCOUNTER — Other Ambulatory Visit: Payer: Self-pay | Admitting: Internal Medicine

## 2023-03-09 ENCOUNTER — Other Ambulatory Visit (HOSPITAL_COMMUNITY): Payer: Self-pay

## 2023-03-09 ENCOUNTER — Other Ambulatory Visit: Payer: Self-pay

## 2023-03-09 DIAGNOSIS — E042 Nontoxic multinodular goiter: Secondary | ICD-10-CM
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
## 2023-03-11 ENCOUNTER — Ambulatory Visit (INDEPENDENT_AMBULATORY_CARE_PROVIDER_SITE_OTHER): Payer: BC Managed Care – PPO | Admitting: Family Medicine

## 2023-03-11 DIAGNOSIS — G43709 Chronic migraine without aura, not intractable, without status migrainosus: Secondary | ICD-10-CM | POA: Diagnosis not present

## 2023-03-11 MED ORDER — ONABOTULINUMTOXINA 200 UNITS IJ SOLR
155.0000 [IU] | Freq: Once | INTRAMUSCULAR | Status: AC
  Administered 2023-03-11: 155 [IU] via INTRAMUSCULAR

## 2023-03-11 NOTE — Progress Notes (Signed)
Botox- 200 units x 1 vial Lot: [id number] Expiration: 04/2025 NDC: 4540-9811-91  Bacteriostatic 0.9% Sodium Chloride- 4 mL total Lot: [id number] Expiration: 11/25 NDC: 21308-657-84  Dx: O96.295 S/P  Witnessed by Ted Mcalpine

## 2023-03-18 ENCOUNTER — Other Ambulatory Visit: Payer: Self-pay | Admitting: Internal Medicine

## 2023-03-18 DIAGNOSIS — Z1231 Encounter for screening mammogram for malignant neoplasm of breast: Secondary | ICD-10-CM

## 2023-03-18 NOTE — Progress Notes (Signed)
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 female who has been referred to [place] by Lorenda Ishihara MD for evaluation and management of possible Porphyria.  Today, she reports that her labs were done over a year ago with her PCP -- these labs were not made available to [place] and have been requested from PCP office. She complains of episodes which consist of peripheral numbness, abdominal cramping, elevated HR, and intermittently elevated BP. She reports a blood pressure reading of 175/110 during one episode.  She reports that in 2020, she experienced severe stomach cramping. She notes that at the time, she was starting a keto diet. This caused her to be hospitalized. During which, she did experience seizures and syncope.   During her episodes, she reports numbness and nerve pain in her extremities. She takes Gabapentin and has had a temporary nerve ablation, which did not improve pain.  She was seen by a neurologist and she was told that her symptoms were likely related to her complex migraines, which she has had since 2018.   She also reports two episodes of vertigo. Se notes that her symptoms are triggered by fasting, stress, and insomnia.   She reports that her episodes last a few hours at a time. To improve her symptoms, she works towards managing stress and including carbohydrates in her diet. She currently endorses a normal diet with no keto restrictions. She reports that she does stay regularly hydrated. She denies any active symptoms at this time. Patient denies any tingling/numbness in her hands at this time.   She notes that she did experience an episode during a  previous 24-hour urine test. She notes that she was unable to follow up with previous referrals due to giving birth January 3rd 2023.  She does have depression and anxiety and has had poor reactions to medications. She denies any major active symptoms. She reports that she has had a hysterectomy due to heavy menstrual cycles. It was found that she has had 15 fibroids.  She denies any sudden weight changes, back pain, or abdominal pain. She denies any concern for sleep apnea but does endorse insomnia. Patient denies any heavy alcohol use, smoking history, or drug use. She takes Ibuprofen as needed.  She denies any family history of cancers. She does report that her great grandma did have hemophilia.??  MEDICAL HISTORY:  Past Medical History:  Diagnosis Date   Anxiety    self reported   Arthritis    in spine   Chronic back pain 2021   low back/hip; had nerve ablation in lumbar/sacral joint   Depression    self reported   History of hiatal hernia    Hypertension    Maxillary sinus cyst    left   Migraine    Multiple thyroid nodules    3; 1.9 cm and 2 cm, 0.6 cm, will see general  surgeon   No pertinent past medical history    Ovarian cyst    Porphyria    UTI (urinary tract infection)     SURGICAL HISTORY: Past Surgical History:  Procedure Laterality Date   BIOPSY  08/27/2022   Procedure: BIOPSY;  Surgeon: Willis Modena, MD;  Location: [facility];  Service:  Gastroenterology;;   CESAREAN SECTION  2013   CESAREAN SECTION N/A 11/26/2021   Procedure: REPEAT CESAREAN SECTION;  Surgeon: Steva Ready, DO;  Location: [facility];  Service: Obstetrics;  Laterality: N/A;   ESOPHAGOGASTRODUODENOSCOPY N/A 08/27/2022   Procedure: ESOPHAGOGASTRODUODENOSCOPY (EGD);  Surgeon: Willis Modena, MD;  Location: [facility];  Service: Gastroenterology;  Laterality: N/A;   laparscopy  2012   ruptured ectopic   radiofrequency ablation of lumbar spine N/A 08/07/2020   ROBOTIC ASSISTED  LAPAROSCOPIC HYSTERECTOMY AND SALPINGECTOMY Bilateral 02/04/2023   Procedure: XI ROBOTIC ASSISTED LAPAROSCOPIC HYSTERECTOMY AND BILATERAL SALPINGO-OOPHORECTOMY;  Surgeon: Gerald Leitz, MD;  Location: [facility] OR;  Service: Gynecology;  Laterality: Bilateral;   ROBOTIC ASSISTED LAPAROSCOPIC LYSIS OF ADHESION  02/04/2023   Procedure: XI ROBOTIC ASSISTED LAPAROSCOPIC LYSIS OF ADHESION;  Surgeon: Gerald Leitz, MD;  Location: [facility] OR;  Service: Gynecology;;   SHOULDER CAPSULORRHAPHY Right    TUBAL LIGATION     fallopian tubes removed   WISDOM TOOTH EXTRACTION      SOCIAL HISTORY: Social History   Socioeconomic History   Marital status: Married    Spouse name: Not on file   Number of children: 1   Years of education: Not on file   Highest education level: Bachelor's degree (e.g., BA, AB, BS)  Occupational History   Not on file  Tobacco Use   Smoking status: Never   Smokeless tobacco: Never  Vaping Use   Vaping Use: Never used  Substance and Sexual Activity   Alcohol use: Not Currently   Drug use: No   Sexual activity: Not Currently    Birth control/protection: None  Other Topics Concern   Not on file  Social History Narrative   Lives at home with her son & his father   Right handed   Drinks 1 cup of caffeine daily   Social Determinants of Health   Financial Resource Strain: Not on file  Food Insecurity: No Food Insecurity (11/05/2021)   Hunger Vital Sign    Worried About Running Out of Food in the Last Year: Never true    Ran Out of Food in the Last Year: Never true  Transportation Needs: Unknown (11/05/2021)   PRAPARE - Administrator, Civil Service (Medical): No    Lack of Transportation (Non-Medical): Not on file  Physical Activity: Not on file  Stress: Not on file  Social Connections: Not on file  Intimate Partner Violence: Not on file    FAMILY HISTORY: Family History  Problem Relation Age of Onset   Hypertension Mother    Heart disease Father    Diabetes  Father    Dementia Father    Cerebral aneurysm Sister    Migraines Sister    Seizures Sister    Heart disease Maternal Grandfather    Heart disease Other        mother's side   High Cholesterol Other        mother's side     ALLERGIES:  is allergic to tylenol [acetaminophen], aspirin, benadryl [diphenhydramine hcl], buspar [buspirone], celexa [citalopram hydrobromide], dilaudid [hydromorphone hcl], effexor xr [venlafaxine], penicillins, prednisone, sulfamethoxazole-trimethoprim, zoloft [sertraline], and latex.  MEDICATIONS:  Current Outpatient Medications  Medication Sig Dispense Refill   botulinum toxin Type A (BOTOX) 200 units injection Inject 155 Units into the muscle every 3 (three) months. Inject 155units to head and neck intramuscularly every 3 months. 1 each 3   esomeprazole (NEXIUM) 20 MG capsule Take 20 mg by mouth daily at 12 noon.  ibuprofen (ADVIL) 600 MG tablet Take 1 tablet (600 mg total) by mouth every 6 (six) hours as needed. 30 tablet 1   pantoprazole (PROTONIX) 40 MG tablet Take 40 mg by mouth daily.     tiZANidine (ZANAFLEX) 4 MG tablet TAKE 1 TABLET BY MOUTH AT BEDTIME. 90 tablet 1   No current facility-administered medications for this visit.   Facility-Administered Medications Ordered in Other Visits  Medication Dose Route Frequency Provider Last Rate Last Admin   bupivacaine liposome (EXPAREL) 1.3 % injection 266 mg  20 mL Infiltration Once Gerald Leitz, MD        REVIEW OF SYSTEMS:    10 Point review of Systems was done is negative except as noted above.  PHYSICAL EXAMINATION: ECOG PERFORMANCE STATUS: 1 - Symptomatic but completely ambulatory  . Vitals:   03/19/23 1212  BP: 129/86  Pulse: 93  Resp: 20  Temp: 98 F (36.7 C)  SpO2: 100%   Filed Weights   03/19/23 1212  Weight: 237 lb 12.8 oz (107.9 kg)   .Body mass index is 40.82 kg/m.  GENERAL:alert, in no acute distress and comfortable SKIN: no acute rashes, no significant lesions EYES:  conjunctiva are pink and non-injected, sclera anicteric OROPHARYNX: MMM, no exudates, no oropharyngeal erythema or ulceration NECK: supple, no JVD LYMPH:  no palpable lymphadenopathy in the cervical, axillary or inguinal regions LUNGS: clear to auscultation b/l with normal respiratory effort HEART: regular rate & rhythm ABDOMEN:  normoactive bowel sounds , non tender, not distended. Extremity: no pedal edema PSYCH: alert & oriented x 3 with fluent speech NEURO: no focal motor/sensory deficits  LABORATORY DATA:  I have reviewed the data as listed .    Latest Ref Rng & Units 02/26/2023   11:41 PM 02/05/2023    5:29 AM 02/02/2023    1:40 PM  CBC  WBC 4.0 - 10.5 K/uL 10.6   13.1   Hemoglobin 12.0 - 15.0 g/dL 16.1  09.6  04.5   Hematocrit 36.0 - 46.0 % 41.1   44.1   Platelets 150 - 400 K/uL 305   346    .    Latest Ref Rng & Units 02/26/2023   11:41 PM 02/02/2023    1:40 PM 11/27/2021    7:34 AM  CMP  Glucose 70 - 99 mg/dL 409  99  811   BUN 6 - 20 mg/dL 11  7  <5   Creatinine 0.44 - 1.00 mg/dL 9.14  7.82  9.56   Sodium 135 - 145 mmol/L 139  137  129   Potassium 3.5 - 5.1 mmol/L 4.1  4.5  4.3   Chloride 98 - 111 mmol/L 103  103  99   CO2 22 - 32 mmol/L 24  26  20    Calcium 8.9 - 10.3 mg/dL 9.4  9.2  8.2   Total Protein 6.5 - 8.1 g/dL 7.1   5.9   Total Bilirubin 0.3 - 1.2 mg/dL 0.4   0.8   Alkaline Phos 38 - 126 U/L 61   290   AST 15 - 41 U/L 22   25   ALT 0 - 44 U/L 20   15       RADIOGRAPHIC STUDIES: I have personally reviewed the radiological images as listed and agreed with the findings in the report. No results found.  ASSESSMENT & PLAN:   Wonderful 42 y.o. 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 History of preeclampsia hypertriglyceridemia obesity   PLAN:  -Discussed lab results  on 02/26/2023 in detail with patient. CBC showed WBC of 10.6K, hemoglobin of 13.4, and platelets of 305K. -will call patient back for additional testing after labs are sent to [place] to  review for further evaluation. -patients PCP office has been called to obtain results of her previous porphyria workup. She has had unexplained peripheral neuralgia/neuropathy like symtptoms, headaches, rashes, abdominal pain without alternative explanations and primarily triggered by stressor, fasting and significant dietary changes to Keto diet. -counseled to avoid fasting, maintaining adequate carbs in her diet, good hydration, avoiding medications that can trigger acute porphyria attacks. -continue to follow-up with neurology to manage complex migraines  FOLLOW-UP: Lab workup pending review if outside labs  The total time spent in the appointment was 45 minutes* .  All of the patient's questions were answered with apparent satisfaction. The patient knows to call the clinic with any problems, questions or concerns.   Wyvonnia Lora MD MS AAHIVMS [facility] Hematology/Oncology Physician [facility]  .*Total Encounter Time as defined by the Centers for Medicare and Medicaid Services includes, in addition to the face-to-face time of a patient visit (documented in the note above) non-face-to-face time: obtaining and reviewing outside history, ordering and reviewing medications, tests or procedures, care coordination (communications with other health care professionals or caregivers) and documentation in the medical record.    I,Mitra Faeizi,acting as a Neurosurgeon for Wyvonnia Lora, MD.,have documented all relevant documentation on the behalf of Wyvonnia Lora, MD,as directed by  Wyvonnia Lora, MD while in the presence of Wyvonnia Lora, MD.  .I have reviewed the above documentation for accuracy and completeness, and I agree with the above. Johney Maine MD

## 2023-03-19 ENCOUNTER — Inpatient Hospital Stay: Payer: BC Managed Care – PPO

## 2023-03-19 ENCOUNTER — Other Ambulatory Visit: Payer: Self-pay

## 2023-03-19 ENCOUNTER — Inpatient Hospital Stay: Payer: BC Managed Care – PPO | Attending: Hematology | Admitting: Hematology

## 2023-03-19 DIAGNOSIS — F419 Anxiety disorder, unspecified: Secondary | ICD-10-CM | POA: Diagnosis not present

## 2023-03-19 DIAGNOSIS — F32A Depression, unspecified: Secondary | ICD-10-CM | POA: Diagnosis not present

## 2023-03-19 DIAGNOSIS — Z90710 Acquired absence of both cervix and uterus: Secondary | ICD-10-CM | POA: Insufficient documentation

## 2023-03-19 NOTE — Progress Notes (Deleted)
[name] [address] [place] [address] Phone: [phone] Subjective:    I'm seeing this patient by the request  of:  Lorenda Ishihara, MD  CC:   [id number]  Debbie [name] is a 42 y.o. female coming in with complaint of back and neck pain. OMT on 01/15/2023. Patient states   Medications patient has been prescribed:   Taking:         Reviewed prior external information including notes and imaging from previsou exam, outside providers and external EMR if available.   As well as notes that were available from care everywhere and other healthcare systems.  Past medical history, social, surgical and family history all reviewed in electronic medical record.  No pertanent information unless stated regarding to the chief complaint.   Past Medical History:  Diagnosis Date   Anxiety    self reported   Arthritis    in spine   Chronic back pain 2021   low back/hip; had nerve ablation in lumbar/sacral joint   Depression    self reported   History of hiatal hernia    Hypertension    Maxillary sinus cyst    left   Migraine    Multiple thyroid nodules    3; 1.9 cm and 2 cm, 0.6 cm, will see general  surgeon   No pertinent past medical history    Ovarian cyst    Porphyria    UTI (urinary tract infection)     Allergies  Allergen Reactions   Tylenol [Acetaminophen] Anaphylaxis   Aspirin Hives    Unknown childhood rxn   Benadryl [Diphenhydramine Hcl] Itching   Buspar [Buspirone] Other (See Comments)    migraines   Celexa [Citalopram Hydrobromide] Other (See Comments)    agitation and dizziness   Dilaudid [Hydromorphone Hcl] Itching    Pill only   Effexor Xr [Venlafaxine]     Other reaction(s): More depressed (11/2017-neurology)   Penicillins     hives   Prednisone     Swelling, hives   Sulfamethoxazole-Trimethoprim     Other reaction(s): swelling (2017)   Zoloft [Sertraline]     Other reaction(s): nausea and swelling    Latex Rash     Review of Systems:  No headache, visual changes, nausea, vomiting, diarrhea, constipation, dizziness, abdominal pain, skin rash, fevers, chills, night sweats, weight loss, swollen lymph nodes, body aches, joint swelling, chest pain, shortness of breath, mood changes. POSITIVE muscle aches  Objective  Last menstrual period 08/20/2022, currently breastfeeding.   General: No apparent distress alert and oriented x3 mood and affect normal, dressed appropriately.  HEENT: Pupils equal, extraocular movements intact  Respiratory: Patient's speak in full sentences and does not appear short of breath  Cardiovascular: No lower extremity edema, non tender, no erythema  Gait MSK:  Back   Osteopathic findings  C2 flexed rotated and side bent right C6 flexed rotated and side bent left T3 extended rotated and side bent right inhaled rib T9 extended rotated and side bent left L2 flexed rotated and side bent right Sacrum right on right       Assessment and Plan:  No problem-specific Assessment & Plan notes found for this encounter.    Nonallopathic problems  Decision today to treat with OMT was based on Physical Exam  After verbal consent patient was treated with HVLA, ME, FPR techniques in cervical, rib, thoracic, lumbar, and sacral  areas  Patient tolerated the procedure well with improvement in symptoms  Patient given  exercises, stretches and lifestyle modifications  See medications in patient instructions if given  Patient will follow up in 4-8 weeks             Note: This dictation was prepared with Dragon dictation along with smaller phrase technology. Any transcriptional errors that result from this process are unintentional.

## 2023-03-24 ENCOUNTER — Ambulatory Visit: Payer: BC Managed Care – PPO | Admitting: Family Medicine

## 2023-03-25 NOTE — Progress Notes (Incomplete)
HEMATOLOGY/ONCOLOGY CONSULTATION NOTE  Date of Service: 03/19/2023  Patient Care Team: Lorenda Ishihara, MD as PCP - General (Internal Medicine) Thomasene Ripple, DO as PCP - Cardiology (Cardiology)  CHIEF COMPLAINTS/PURPOSE OF CONSULTATION:  Evaluation and management of Porphyria   HISTORY OF PRESENTING ILLNESS:   Debbie [name] is a wonderful 43 y.o. female who has been referred to [place] by Lorenda Ishihara MD for evaluation and management of possible Porphyria.  Today, she reports that her labs were done over a year ago with her PCP -- these labs were not made available to [place] and have been requested from PCP office. She complains of episodes which consist of peripheral numbness, abdominal cramping, elevated HR, and intermittently elevated BP. She reports a blood pressure reading of 175/110 during one episode.  She reports that in 2020, she experienced severe stomach cramping. She notes that at the time, she was starting a keto diet. This caused her to be hospitalized. During which, she did experience seizures and syncope.   During her episodes, she reports numbness and nerve pain in her extremities. She takes Gabapentin and has had a temporary nerve ablation, which did not improve pain.  She was seen by a neurologist and she was told that her symptoms were likely related to her complex migraines, which she has had since 2018.   She also reports two episodes of vertigo. Se notes that her symptoms are triggered by fasting, stress, and insomnia.   She reports that her episodes last a few hours at a time. To improve her symptoms, she works towards managing stress and including carbohydrates in her diet. She currently endorses a normal diet with no keto restrictions. She reports that she does stay regularly hydrated. She denies any active symptoms at this time. Patient denies any tingling/numbness in her hands at this time.   She notes that she did experience an episode during a  previous 24-hour urine test. She notes that she was unable to follow up with previous referrals due to giving birth January 3rd 2023.  She does have depression and anxiety and has had poor reactions to medications. She denies any major active symptoms. She reports that she has had a hysterectomy due to heavy menstrual cycles. It was found that she has had 15 fibroids.  She denies any sudden weight changes, back pain, or abdominal pain. She denies any concern for sleep apnea but does endorse insomnia. Patient denies any heavy alcohol use, smoking history, or drug use. She takes Ibuprofen as needed.  She denies any family history of cancers. She does report that her great grandma did have hemophilia.??  MEDICAL HISTORY:  Past Medical History:  Diagnosis Date  . Anxiety    self reported  . Arthritis    in spine  . Chronic back pain 2021   low back/hip; had nerve ablation in lumbar/sacral joint  . Depression    self reported  . History of hiatal hernia   . Hypertension   . Maxillary sinus cyst    left  . Migraine   . Multiple thyroid nodules    3; 1.9 cm and 2 cm, 0.6 cm, will see general  surgeon  . No pertinent past medical history   . Ovarian cyst   . Porphyria   . UTI (urinary tract infection)     SURGICAL HISTORY: Past Surgical History:  Procedure Laterality Date  . BIOPSY  08/27/2022   Procedure: BIOPSY;  Surgeon: Willis Modena, MD;  Location: [facility];  Service:  Gastroenterology;;  . CESAREAN SECTION  2013  . CESAREAN SECTION N/A 11/26/2021   Procedure: REPEAT CESAREAN SECTION;  Surgeon: Steva Ready, DO;  Location: [facility];  Service: Obstetrics;  Laterality: N/A;  . ESOPHAGOGASTRODUODENOSCOPY N/A 08/27/2022   Procedure: ESOPHAGOGASTRODUODENOSCOPY (EGD);  Surgeon: Willis Modena, MD;  Location: [facility];  Service: Gastroenterology;  Laterality: N/A;  . laparscopy  2012   ruptured ectopic  . radiofrequency ablation of lumbar spine N/A 08/07/2020  .  ROBOTIC ASSISTED LAPAROSCOPIC HYSTERECTOMY AND SALPINGECTOMY Bilateral 02/04/2023   Procedure: XI ROBOTIC ASSISTED LAPAROSCOPIC HYSTERECTOMY AND BILATERAL SALPINGO-OOPHORECTOMY;  Surgeon: Gerald Leitz, MD;  Location: [facility] OR;  Service: Gynecology;  Laterality: Bilateral;  . ROBOTIC ASSISTED LAPAROSCOPIC LYSIS OF ADHESION  02/04/2023   Procedure: XI ROBOTIC ASSISTED LAPAROSCOPIC LYSIS OF ADHESION;  Surgeon: Gerald Leitz, MD;  Location: [facility] OR;  Service: Gynecology;;  . SHOULDER CAPSULORRHAPHY Right   . TUBAL LIGATION     fallopian tubes removed  . WISDOM TOOTH EXTRACTION      SOCIAL HISTORY: Social History   Socioeconomic History  . Marital status: Married    Spouse name: Not on file  . Number of children: 1  . Years of education: Not on file  . Highest education level: Bachelor's degree (e.g., BA, AB, BS)  Occupational History  . Not on file  Tobacco Use  . Smoking status: Never  . Smokeless tobacco: Never  Vaping Use  . Vaping Use: Never used  Substance and Sexual Activity  . Alcohol use: Not Currently  . Drug use: No  . Sexual activity: Not Currently    Birth control/protection: None  Other Topics Concern  . Not on file  Social History Narrative   Lives at home with her son & his father   Right handed   Drinks 1 cup of caffeine daily   Social Determinants of Health   Financial Resource Strain: Not on file  Food Insecurity: No Food Insecurity (11/05/2021)   Hunger Vital Sign   . Worried About Programme researcher, broadcasting/film/video in the Last Year: Never true   . Ran Out of Food in the Last Year: Never true  Transportation Needs: Unknown (11/05/2021)   PRAPARE - Transportation   . Lack of Transportation (Medical): No   . Lack of Transportation (Non-Medical): Not on file  Physical Activity: Not on file  Stress: Not on file  Social Connections: Not on file  Intimate Partner Violence: Not on file    FAMILY HISTORY: Family History  Problem Relation Age of Onset  . Hypertension Mother   .  Heart disease Father   . Diabetes Father   . Dementia Father   . Cerebral aneurysm Sister   . Migraines Sister   . Seizures Sister   . Heart disease Maternal Grandfather   . Heart disease Other        mother's side  . High Cholesterol Other        mother's side     ALLERGIES:  is allergic to tylenol [acetaminophen], aspirin, benadryl [diphenhydramine hcl], buspar [buspirone], celexa [citalopram hydrobromide], dilaudid [hydromorphone hcl], effexor xr [venlafaxine], penicillins, prednisone, sulfamethoxazole-trimethoprim, zoloft [sertraline], and latex.  MEDICATIONS:  Current Outpatient Medications  Medication Sig Dispense Refill  . botulinum toxin Type A (BOTOX) 200 units injection Inject 155 Units into the muscle every 3 (three) months. Inject 155units to head and neck intramuscularly every 3 months. 1 each 3  . esomeprazole (NEXIUM) 20 MG capsule Take 20 mg by mouth daily at 12 noon.    Marland Kitchen  ibuprofen (ADVIL) 600 MG tablet Take 1 tablet (600 mg total) by mouth every 6 (six) hours as needed. 30 tablet 1  . pantoprazole (PROTONIX) 40 MG tablet Take 40 mg by mouth daily.    Marland Kitchen tiZANidine (ZANAFLEX) 4 MG tablet TAKE 1 TABLET BY MOUTH AT BEDTIME. 90 tablet 1   No current facility-administered medications for this visit.   Facility-Administered Medications Ordered in Other Visits  Medication Dose Route Frequency Provider Last Rate Last Admin  . bupivacaine liposome (EXPAREL) 1.3 % injection 266 mg  20 mL Infiltration Once Gerald Leitz, MD        REVIEW OF SYSTEMS:    10 Point review of Systems was done is negative except as noted above.  PHYSICAL EXAMINATION: ECOG PERFORMANCE STATUS: 1 - Symptomatic but completely ambulatory  . Vitals:   03/19/23 1212  BP: 129/86  Pulse: 93  Resp: 20  Temp: 98 F (36.7 C)  SpO2: 100%   Filed Weights   03/19/23 1212  Weight: 237 lb 12.8 oz (107.9 kg)   .Body mass index is 40.82 kg/m.  GENERAL:alert, in no acute distress and  comfortable SKIN: no acute rashes, no significant lesions EYES: conjunctiva are pink and non-injected, sclera anicteric OROPHARYNX: MMM, no exudates, no oropharyngeal erythema or ulceration NECK: supple, no JVD LYMPH:  no palpable lymphadenopathy in the cervical, axillary or inguinal regions LUNGS: clear to auscultation b/l with normal respiratory effort HEART: regular rate & rhythm ABDOMEN:  normoactive bowel sounds , non tender, not distended. Extremity: no pedal edema PSYCH: alert & oriented x 3 with fluent speech NEURO: no focal motor/sensory deficits  LABORATORY DATA:  I have reviewed the data as listed .    Latest Ref Rng & Units 02/26/2023   11:41 PM 02/05/2023    5:29 AM 02/02/2023    1:40 PM  CBC  WBC 4.0 - 10.5 K/uL 10.6   13.1   Hemoglobin 12.0 - 15.0 g/dL 78.2  95.6  21.3   Hematocrit 36.0 - 46.0 % 41.1   44.1   Platelets 150 - 400 K/uL 305   346    .    Latest Ref Rng & Units 02/26/2023   11:41 PM 02/02/2023    1:40 PM 11/27/2021    7:34 AM  CMP  Glucose 70 - 99 mg/dL 086  99  578   BUN 6 - 20 mg/dL 11  7  <5   Creatinine 0.44 - 1.00 mg/dL 4.69  6.29  5.28   Sodium 135 - 145 mmol/L 139  137  129   Potassium 3.5 - 5.1 mmol/L 4.1  4.5  4.3   Chloride 98 - 111 mmol/L 103  103  99   CO2 22 - 32 mmol/L 24  26  20    Calcium 8.9 - 10.3 mg/dL 9.4  9.2  8.2   Total Protein 6.5 - 8.1 g/dL 7.1   5.9   Total Bilirubin 0.3 - 1.2 mg/dL 0.4   0.8   Alkaline Phos 38 - 126 U/L 61   290   AST 15 - 41 U/L 22   25   ALT 0 - 44 U/L 20   15       RADIOGRAPHIC STUDIES: I have personally reviewed the radiological images as listed and agreed with the findings in the report. No results found.  ASSESSMENT & PLAN:   Wonderful 42 y.o. with:   History of preeclampsia hypertriglyceridemia obesity   PLAN:  -Discussed lab results on 02/26/2023 in detail  with patient. CBC showed WBC of 10.6K, hemoglobin of 13.4, and platelets of 305K. -will call patient back for additional testing  after labs are sent to [place] to review for further evaluation -continue to follow-up with neurology to manage complex migraines  FOLLOW-UP: ***  The total time spent in the appointment was *** minutes* .  All of the patient's questions were answered with apparent satisfaction. The patient knows to call the clinic with any problems, questions or concerns.   Wyvonnia Lora MD MS AAHIVMS [facility] Hematology/Oncology Physician [facility]  .*Total Encounter Time as defined by the Centers for Medicare and Medicaid Services includes, in addition to the face-to-face time of a patient visit (documented in the note above) non-face-to-face time: obtaining and reviewing outside history, ordering and reviewing medications, tests or procedures, care coordination (communications with other health care professionals or caregivers) and documentation in the medical record.    I,Mitra Faeizi,acting as a Neurosurgeon for Wyvonnia Lora, MD.,have documented all relevant documentation on the behalf of Wyvonnia Lora, MD,as directed by  Wyvonnia Lora, MD while in the presence of Wyvonnia Lora, MD.  ***

## 2023-03-27 ENCOUNTER — Ambulatory Visit
Admission: RE | Admit: 2023-03-27 | Discharge: 2023-03-27 | Disposition: A | Discharge status: Home / Self Care | Payer: BC Managed Care – PPO | Source: Ambulatory Visit | Attending: Internal Medicine | Admitting: Internal Medicine

## 2023-03-27 DIAGNOSIS — E042 Nontoxic multinodular goiter: Secondary | ICD-10-CM

## 2023-04-17 ENCOUNTER — Ambulatory Visit
Admission: RE | Admit: 2023-04-17 | Discharge: 2023-04-17 | Disposition: A | Discharge status: Home / Self Care | Payer: BC Managed Care – PPO | Source: Ambulatory Visit

## 2023-04-17 DIAGNOSIS — Z1231 Encounter for screening mammogram for malignant neoplasm of breast: Secondary | ICD-10-CM

## 2023-04-21 NOTE — Progress Notes (Unsigned)
[name] [address] [place] [address] Phone: [phone] Subjective:   INadine Counts, am serving as a scribe for Dr. Antoine Primas.  I'm seeing this patient by the request  of:  Lorenda Ishihara, MD  CC: Back and neck pain follow-up  [id number]  Debbie [name] is a 42 y.o. female coming in with complaint of back and neck pain. OMT on 01/15/2023. Patient states doing well. No new concerns.  Patient continues to have significant left-sided sacroiliac pain.  States that she is never without some discomfort in the area.  Patient states that it is unrelenting sometimes.  Can wake her up at night.  At the moment seems to be giving her difficulty but not severe enough that stopping her from activities today.  Medications patient has been prescribed: zanaflex  Taking:         Reviewed prior external information including notes and imaging from previsou exam, outside providers and external EMR if available.   As well as notes that were available from care everywhere and other healthcare systems.  Patient has been seen by oncology recently for prophy.  Past medical history, social, surgical and family history all reviewed in electronic medical record.  No pertanent information unless stated regarding to the chief complaint.   Past Medical History:  Diagnosis Date   Anxiety    self reported   Arthritis    in spine   Chronic back pain 2021   low back/hip; had nerve ablation in lumbar/sacral joint   Depression    self reported   History of hiatal hernia    Hypertension    Maxillary sinus cyst    left   Migraine    Multiple thyroid nodules    3; 1.9 cm and 2 cm, 0.6 cm, will see general  surgeon   No pertinent past medical history    Ovarian cyst    Porphyria (HCC)    UTI (urinary tract infection)     Allergies  Allergen Reactions   Tylenol [Acetaminophen] Anaphylaxis   Aspirin Hives    Unknown childhood rxn   Benadryl  [Diphenhydramine Hcl] Itching   Buspar [Buspirone] Other (See Comments)    migraines   Celexa [Citalopram Hydrobromide] Other (See Comments)    agitation and dizziness   Dilaudid [Hydromorphone Hcl] Itching    Pill only   Effexor Xr [Venlafaxine]     Other reaction(s): More depressed (11/2017-neurology)   Penicillins     hives   Prednisone     Swelling, hives   Sulfamethoxazole-Trimethoprim     Other reaction(s): swelling (2017)   Zoloft [Sertraline]     Other reaction(s): nausea and swelling   Latex Rash     Review of Systems:  No headache, visual changes, nausea, vomiting, diarrhea, constipation, dizziness, abdominal pain, skin rash, fevers, chills, night sweats, weight loss, swollen lymph nodes, body aches, joint swelling, chest pain, shortness of breath, mood changes. POSITIVE muscle aches  Objective  Blood pressure 114/86, pulse 84, height 5\' 4"  (1.626 m), weight 243 lb (110.2 kg), last menstrual period 08/20/2022, SpO2 95 %, currently breastfeeding.   General: No apparent distress alert and oriented x3 mood and affect normal, dressed appropriately.  HEENT: Pupils equal, extraocular movements intact  Respiratory: Patient's speak in full sentences and does not appear short of breath  Cardiovascular: No lower extremity edema, non tender, no erythema  Gait normal overall MSK:  Back does have loss of lordosis.  Poor core strength noted.  Tightness noted with FABER test bilaterally but does seem to be worse left greater than right.  Patient does have pain even to light palpation negative straight leg test noted low today.  Osteopathic findings T6 extended rotated and side bent right inhaled rib T7 extended rotated and side bent left L2 flexed rotated and side bent right Sacrum left on left       Assessment and Plan:  Degenerative disc disease, lumbar Degenerative disc disease noted.  Discussed icing regimen and home exercises, which activities to do and which ones to  avoid.  Patient continues to have pain that could be secondary to some of the myofascial pain syndrome as well.  We discussed potentially trying some other injections but unfortunately secondary to patient's multiple allergies this is not a possibility.  Increase the patient's Zanaflex to 8 mg at bedtime.  Warned of potential side effects.  Patient wants to try it and see how she does and follow-up with me again in 6 to 8 weeks.    Nonallopathic problems  Decision today to treat with OMT was based on Physical Exam  After verbal consent patient was treated with HVLA, ME, FPR techniques in  rib, thoracic, lumbar, and sacral  areas  Patient tolerated the procedure well with improvement in symptoms  Patient given exercises, stretches and lifestyle modifications  See medications in patient instructions if given  Patient will follow up in 4-8 weeks    The above documentation has been reviewed and is accurate and complete Judi Saa, DO          Note: This dictation was prepared with Dragon dictation along with smaller phrase technology. Any transcriptional errors that result from this process are unintentional.

## 2023-04-23 ENCOUNTER — Ambulatory Visit (INDEPENDENT_AMBULATORY_CARE_PROVIDER_SITE_OTHER): Payer: BC Managed Care – PPO | Admitting: Family Medicine

## 2023-04-23 VITALS — BP 114/86 | HR 84 | Ht 64.0 in | Wt 243.0 lb

## 2023-04-23 DIAGNOSIS — M9908 Segmental and somatic dysfunction of rib cage: Secondary | ICD-10-CM | POA: Diagnosis not present

## 2023-04-23 DIAGNOSIS — M9903 Segmental and somatic dysfunction of lumbar region: Secondary | ICD-10-CM | POA: Diagnosis not present

## 2023-04-23 DIAGNOSIS — M5136 Other intervertebral disc degeneration, lumbar region: Secondary | ICD-10-CM

## 2023-04-23 DIAGNOSIS — M9902 Segmental and somatic dysfunction of thoracic region: Secondary | ICD-10-CM

## 2023-04-23 DIAGNOSIS — M51369 Other intervertebral disc degeneration, lumbar region without mention of lumbar back pain or lower extremity pain: Secondary | ICD-10-CM

## 2023-04-23 DIAGNOSIS — M9904 Segmental and somatic dysfunction of sacral region: Secondary | ICD-10-CM

## 2023-04-23 MED ORDER — TIZANIDINE HCL 4 MG PO TABS: 8.0000 mg | ORAL_TABLET | Freq: Every evening | ORAL | 1 refills | Status: DC | PRN

## 2023-04-23 NOTE — Patient Instructions (Signed)
Good to see you! Workout every other day Zanaflex 8mg  as needed at night See you again in 6-8 weeks

## 2023-04-23 NOTE — Assessment & Plan Note (Addendum)
Degenerative disc disease noted.  Discussed icing regimen and home exercises, which activities to do and which ones to avoid.  Patient continues to have pain that could be secondary to some of the myofascial pain syndrome as well.  We discussed potentially trying some other injections but unfortunately secondary to patient's multiple allergies this is not a possibility.  Increase the patient's Zanaflex to 8 mg at bedtime.  Warned of potential side effects.  Patient wants to try it and see how she does and follow-up with me again in 6 to 8 weeks.  Would consider potentially repeating advanced imaging if this continues to worsen.

## 2023-05-25 ENCOUNTER — Other Ambulatory Visit (HOSPITAL_COMMUNITY): Payer: Self-pay

## 2023-06-05 NOTE — Progress Notes (Deleted)
[name] [address] [place] [address] Phone: [phone] Subjective:    I'm seeing this patient by the request  of:  Lorenda Ishihara, MD  CC: Back and neck pain follow-up  [id number]  Kaleah Gatch is a 42 y.o. female coming in with complaint of back and neck pain. OMT on 04/23/2023. Patient states   Medications patient has been prescribed: Zanaflex  Taking:         Reviewed prior external information including notes and imaging from previsou exam, outside providers and external EMR if available.   As well as notes that were available from care everywhere and other healthcare systems.  Past medical history, social, surgical and family history all reviewed in electronic medical record.  No pertanent information unless stated regarding to the chief complaint.   Past Medical History:  Diagnosis Date   Anxiety    self reported   Arthritis    in spine   Chronic back pain 2021   low back/hip; had nerve ablation in lumbar/sacral joint   Depression    self reported   History of hiatal hernia    Hypertension    Maxillary sinus cyst    left   Migraine    Multiple thyroid nodules    3; 1.9 cm and 2 cm, 0.6 cm, will see general  surgeon   No pertinent past medical history    Ovarian cyst    Porphyria (HCC)    UTI (urinary tract infection)     Allergies  Allergen Reactions   Tylenol [Acetaminophen] Anaphylaxis   Aspirin Hives    Unknown childhood rxn   Benadryl [Diphenhydramine Hcl] Itching   Buspar [Buspirone] Other (See Comments)    migraines   Celexa [Citalopram Hydrobromide] Other (See Comments)    agitation and dizziness   Dilaudid [Hydromorphone Hcl] Itching    Pill only   Effexor Xr [Venlafaxine]     Other reaction(s): More depressed (11/2017-neurology)   Penicillins     hives   Prednisone     Swelling, hives   Sulfamethoxazole-Trimethoprim     Other reaction(s): swelling (2017)   Zoloft [Sertraline]      Other reaction(s): nausea and swelling   Latex Rash     Review of Systems:  No headache, visual changes, nausea, vomiting, diarrhea, constipation, dizziness, abdominal pain, skin rash, fevers, chills, night sweats, weight loss, swollen lymph nodes, body aches, joint swelling, chest pain, shortness of breath, mood changes. POSITIVE muscle aches  Objective  Last menstrual period 08/20/2022, currently breastfeeding.   General: No apparent distress alert and oriented x3 mood and affect normal, dressed appropriately.  HEENT: Pupils equal, extraocular movements intact  Respiratory: Patient's speak in full sentences and does not appear short of breath  Cardiovascular: No lower extremity edema, non tender, no erythema  Gait MSK:  Back   Osteopathic findings  C2 flexed rotated and side bent right C6 flexed rotated and side bent left T3 extended rotated and side bent right inhaled rib T9 extended rotated and side bent left L2 flexed rotated and side bent right Sacrum right on right       Assessment and Plan:  No problem-specific Assessment & Plan notes found for this encounter.    Nonallopathic problems  Decision today to treat with OMT was based on Physical Exam  After verbal consent patient was treated with HVLA, ME, FPR techniques in cervical, rib, thoracic, lumbar, and sacral  areas  Patient tolerated the procedure well with improvement  in symptoms  Patient given exercises, stretches and lifestyle modifications  See medications in patient instructions if given  Patient will follow up in 4-8 weeks     The above documentation has been reviewed and is accurate and complete Judi Saa, DO         Note: This dictation was prepared with Dragon dictation along with smaller phrase technology. Any transcriptional errors that result from this process are unintentional.

## 2023-06-09 ENCOUNTER — Ambulatory Visit: Payer: BC Managed Care – PPO | Admitting: Family Medicine

## 2023-06-11 ENCOUNTER — Ambulatory Visit: Payer: BC Managed Care – PPO | Admitting: Family Medicine

## 2023-06-29 DIAGNOSIS — Z0289 Encounter for other administrative examinations: Secondary | ICD-10-CM

## 2023-07-02 ENCOUNTER — Other Ambulatory Visit (HOSPITAL_COMMUNITY): Payer: Self-pay

## 2023-07-06 ENCOUNTER — Encounter (INDEPENDENT_AMBULATORY_CARE_PROVIDER_SITE_OTHER): Payer: Self-pay | Admitting: Family Medicine

## 2023-07-06 ENCOUNTER — Ambulatory Visit (INDEPENDENT_AMBULATORY_CARE_PROVIDER_SITE_OTHER): Payer: BC Managed Care – PPO | Admitting: Family Medicine

## 2023-07-06 DIAGNOSIS — Z6841 Body Mass Index (BMI) 40.0 and over, adult: Secondary | ICD-10-CM | POA: Diagnosis not present

## 2023-07-06 DIAGNOSIS — E669 Obesity, unspecified: Secondary | ICD-10-CM | POA: Diagnosis not present

## 2023-07-07 NOTE — Progress Notes (Signed)
Office: [phone]  /  Fax: [phone]   Initial Visit  Debbie [name] was seen in clinic today to evaluate for obesity. She is interested in losing weight to improve overall health and reduce the risk of weight related complications. She presents today to review program treatment options, initial physical assessment, and evaluation.     She was referred by: PCP  When asked what else they would like to accomplish? She states: Improve existing medical conditions and Improve quality of life  When asked how has your weight affected you? She states: Contributed to medical problems and Having fatigue  Some associated conditions: Other: chronic pain  Contributing factors: Reduced physical activity, Pregnancy, Menopause, and Other: porphyria  Weight promoting medications identified: None  Current nutrition plan: None  Current level of physical activity: None  Current or previous pharmacotherapy: None   Past medical history includes:   Past Medical History:  Diagnosis Date   Anxiety    self reported   Arthritis    in spine   Chronic back pain 2021   low back/hip; had nerve ablation in lumbar/sacral joint   Depression    self reported   History of hiatal hernia    Hypertension    Maxillary sinus cyst    left   Migraine    Multiple thyroid nodules    3; 1.9 cm and 2 cm, 0.6 cm, will see general  surgeon   No pertinent past medical history    Ovarian cyst    Porphyria (HCC)    UTI (urinary tract infection)      Objective:   BP 136/86   Pulse 82   Temp 97.7 F (36.5 C)   Ht 5\' 4"  (1.626 m)   Wt 249 lb (112.9 kg)   LMP 08/20/2022 (Exact Date) Comment: currently menstruating  SpO2 95%   BMI 42.74 kg/m  She was weighed on the bioimpedance scale: Body mass index is 42.74 kg/m.  Peak Weight:253 lbs ,Visceral Fat Rating:14, Body Fat%:46.9.  General:  Alert, oriented and cooperative. Patient is in no acute distress.  Respiratory: Normal respiratory effort, no  problems with respiration noted  Extremities: Normal range of motion.    Mental Status: Normal mood and affect. Normal behavior. Normal judgment and thought content.   Assessment and Plan:  1. 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  She notes exacerbations are more frequent if her carbohydrates to drop too low.  Plan: Patient's goal is to keep carbohydrates approximately 60%.  She may need to use protein supplements to meet her goals.  We will schedule her for a workup to evaluate further.  2. BMI 40.0-44.9, adult (HCC)  3. Obesity, Beginning BMI 42.7 We reviewed weight, biometrics, associated medical conditions and contributing factors with patient. She would benefit from weight loss therapy via a modified calorie, low-carb, high-protein nutritional plan tailored to their REE (resting energy expenditure) which will be determined by indirect calorimetry.  We will also assess for cardiometabolic risk and nutritional derangements via fasting serologies at her next appointment.      Obesity Treatment / Action Plan:  Will complete provided nutritional and psychosocial assessment questionnaire before the next appointment. Will be scheduled for indirect calorimetry to determine resting energy expenditure in a fasting state.  This will allow [place] to create a reduced calorie, high-protein meal plan to promote loss of fat mass while preserving muscle mass.  Obesity Education Performed Today:  She  was weighed on the bioimpedance scale and results were discussed and documented in the synopsis.  We discussed obesity as a disease and the importance of a more detailed evaluation of all the factors contributing to the disease.  We discussed the importance of long term lifestyle changes which include nutrition, exercise and behavioral modifications as well as the importance of customizing this to her specific  health and social needs.  We discussed the benefits of reaching a healthier weight to alleviate the symptoms of existing conditions and reduce the risks of the biomechanical, metabolic and psychological effects of obesity.  Debbie [name] appears to be in the action stage of change and states they are ready to start intensive lifestyle modifications and behavioral modifications.  45 minutes was spent today on this visit including the above counseling, pre-visit chart review, and post-visit documentation.  Reviewed by clinician on day of visit: allergies, medications, problem list, medical history, surgical history, family history, social history, and previous encounter notes.   I, Burt Knack, am acting as transcriptionist for Quillian Quince, MD   I have reviewed the above documentation for accuracy and completeness, and I agree with the above. Quillian Quince, MD
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
## 2023-07-14 ENCOUNTER — Ambulatory Visit: Payer: BC Managed Care – PPO | Admitting: Family Medicine

## 2023-07-14 DIAGNOSIS — G43709 Chronic migraine without aura, not intractable, without status migrainosus: Secondary | ICD-10-CM

## 2023-07-14 MED ORDER — ONABOTULINUMTOXINA 200 UNITS IJ SOLR
155.0000 [IU] | Freq: Once | INTRAMUSCULAR | Status: AC
Start: 2023-07-14 — End: 2023-07-14
  Administered 2023-07-14: 155 [IU] via INTRAMUSCULAR

## 2023-07-14 NOTE — Progress Notes (Signed)
Botox- 200 units x 1 vial Lot:[id number] Expiration:10/2025 NDC: 0023-3921-02  Bacteriostatic 0.9% Sodium Chloride- * mL  Lot: [id number] Expiration: 02/23/2024 NDC:0409-1966-02  Dx:G43.709 S/P Witnessed by: Felicity Pellegrini, RN

## 2023-07-16 NOTE — Progress Notes (Deleted)
[name] [address] [place] [address] Phone: [phone] Subjective:    I'm seeing this patient by the request  of:  Lorenda Ishihara, MD  CC: back and neck pain   [id number]  Debbie [name] is a 42 y.o. female coming in with complaint of back and neck pain. OMT 04/23/2023. Patient states   Medications patient has been prescribed: Zanaflex  Taking:         Reviewed prior external information including notes and imaging from previsou exam, outside providers and external EMR if available.   As well as notes that were available from care everywhere and other healthcare systems.  Past medical history, social, surgical and family history all reviewed in electronic medical record.  No pertanent information unless stated regarding to the chief complaint.   Past Medical History:  Diagnosis Date   Anxiety    self reported   Arthritis    in spine   Chronic back pain 2021   low back/hip; had nerve ablation in lumbar/sacral joint   Depression    self reported   History of hiatal hernia    Hypertension    Maxillary sinus cyst    left   Migraine    Multiple thyroid nodules    3; 1.9 cm and 2 cm, 0.6 cm, will see general  surgeon   No pertinent past medical history    Ovarian cyst    Porphyria (HCC)    UTI (urinary tract infection)     Allergies  Allergen Reactions   Tylenol [Acetaminophen] Anaphylaxis   Aspirin Hives    Unknown childhood rxn   Benadryl [Diphenhydramine Hcl] Itching   Buspar [Buspirone] Other (See Comments)    migraines   Celexa [Citalopram Hydrobromide] Other (See Comments)    agitation and dizziness   Dilaudid [Hydromorphone Hcl] Itching    Pill only   Effexor Xr [Venlafaxine]     Other reaction(s): More depressed (11/2017-neurology)   Penicillins     hives   Prednisone     Swelling, hives   Sulfamethoxazole-Trimethoprim     Other reaction(s): swelling (2017)   Zoloft [Sertraline]     Other  reaction(s): nausea and swelling   Latex Rash     Review of Systems:  No headache, visual changes, nausea, vomiting, diarrhea, constipation, dizziness, abdominal pain, skin rash, fevers, chills, night sweats, weight loss, swollen lymph nodes, body aches, joint swelling, chest pain, shortness of breath, mood changes. POSITIVE muscle aches  Objective  Last menstrual period 08/20/2022, currently breastfeeding.   General: No apparent distress alert and oriented x3 mood and affect normal, dressed appropriately.  HEENT: Pupils equal, extraocular movements intact  Respiratory: Patient's speak in full sentences and does not appear short of breath  Cardiovascular: No lower extremity edema, non tender, no erythema  MSK:  Back does have loss of lordosis   Osteopathic findings  T3 extended rotated and side bent right inhaled rib T9 extended rotated and side bent left L2 flexed rotated and side bent right Sacrum right on right     Assessment and Plan:  No problem-specific Assessment & Plan notes found for this encounter.    Nonallopathic problems  Decision today to treat with OMT was based on Physical Exam  After verbal consent patient was treated with HVLA, ME, FPR techniques in cervical, rib, thoracic, lumbar, and sacral  areas  Patient tolerated the procedure well with improvement in symptoms  Patient given exercises, stretches and lifestyle modifications  See medications  in patient instructions if given  Patient will follow up in 4-8 weeks     The above documentation has been reviewed and is accurate and complete Judi Saa, DO         Note: This dictation was prepared with Dragon dictation along with smaller phrase technology. Any transcriptional errors that result from this process are unintentional.

## 2023-07-17 ENCOUNTER — Ambulatory Visit: Payer: BC Managed Care – PPO | Admitting: Family Medicine

## 2023-07-21 ENCOUNTER — Other Ambulatory Visit: Payer: Self-pay | Admitting: Family Medicine

## 2023-07-21 ENCOUNTER — Other Ambulatory Visit: Payer: Self-pay

## 2023-07-21 MED ORDER — TIZANIDINE HCL 4 MG PO TABS: 4.0000 mg | ORAL_TABLET | Freq: Every evening | ORAL | 0 refills | Status: DC | PRN

## 2023-07-21 NOTE — Progress Notes (Deleted)
[name] [address] [place] [address] Phone: [phone] Subjective:    I'm seeing this patient by the request  of:  Lorenda Ishihara, MD  CC:   [id number]  Debbie [name] is a 42 y.o. female coming in with complaint of back and neck pain. OMT 04/23/2023. Patient states   Medications patient has been prescribed:   Taking:         Reviewed prior external information including notes and imaging from previsou exam, outside providers and external EMR if available.   As well as notes that were available from care everywhere and other healthcare systems.  Past medical history, social, surgical and family history all reviewed in electronic medical record.  No pertanent information unless stated regarding to the chief complaint.   Past Medical History:  Diagnosis Date   Anxiety    self reported   Arthritis    in spine   Chronic back pain 2021   low back/hip; had nerve ablation in lumbar/sacral joint   Depression    self reported   History of hiatal hernia    Hypertension    Maxillary sinus cyst    left   Migraine    Multiple thyroid nodules    3; 1.9 cm and 2 cm, 0.6 cm, will see general  surgeon   No pertinent past medical history    Ovarian cyst    Porphyria (HCC)    UTI (urinary tract infection)     Allergies  Allergen Reactions   Tylenol [Acetaminophen] Anaphylaxis   Aspirin Hives    Unknown childhood rxn   Benadryl [Diphenhydramine Hcl] Itching   Buspar [Buspirone] Other (See Comments)    migraines   Celexa [Citalopram Hydrobromide] Other (See Comments)    agitation and dizziness   Dilaudid [Hydromorphone Hcl] Itching    Pill only   Effexor Xr [Venlafaxine]     Other reaction(s): More depressed (11/2017-neurology)   Penicillins     hives   Prednisone     Swelling, hives   Sulfamethoxazole-Trimethoprim     Other reaction(s): swelling (2017)   Zoloft [Sertraline]     Other reaction(s): nausea and  swelling   Latex Rash     Review of Systems:  No headache, visual changes, nausea, vomiting, diarrhea, constipation, dizziness, abdominal pain, skin rash, fevers, chills, night sweats, weight loss, swollen lymph nodes, body aches, joint swelling, chest pain, shortness of breath, mood changes. POSITIVE muscle aches  Objective  Last menstrual period 08/20/2022, currently breastfeeding.   General: No apparent distress alert and oriented x3 mood and affect normal, dressed appropriately.  HEENT: Pupils equal, extraocular movements intact  Respiratory: Patient's speak in full sentences and does not appear short of breath  Cardiovascular: No lower extremity edema, non tender, no erythema  Gait MSK:  Back   Osteopathic findings  C2 flexed rotated and side bent right C6 flexed rotated and side bent left T3 extended rotated and side bent right inhaled rib T9 extended rotated and side bent left L2 flexed rotated and side bent right Sacrum right on right       Assessment and Plan:  No problem-specific Assessment & Plan notes found for this encounter.    Nonallopathic problems  Decision today to treat with OMT was based on Physical Exam  After verbal consent patient was treated with HVLA, ME, FPR techniques in cervical, rib, thoracic, lumbar, and sacral  areas  Patient tolerated the procedure well with improvement in symptoms  Patient given  exercises, stretches and lifestyle modifications  See medications in patient instructions if given  Patient will follow up in 4-8 weeks             Note: This dictation was prepared with Dragon dictation along with smaller phrase technology. Any transcriptional errors that result from this process are unintentional.

## 2023-07-22 ENCOUNTER — Ambulatory Visit: Payer: BC Managed Care – PPO | Admitting: Family Medicine

## 2023-07-30 ENCOUNTER — Other Ambulatory Visit (HOSPITAL_COMMUNITY): Payer: Self-pay

## 2023-08-13 ENCOUNTER — Encounter (INDEPENDENT_AMBULATORY_CARE_PROVIDER_SITE_OTHER): Payer: Self-pay | Admitting: Family Medicine

## 2023-08-13 ENCOUNTER — Ambulatory Visit (INDEPENDENT_AMBULATORY_CARE_PROVIDER_SITE_OTHER): Payer: BC Managed Care – PPO | Admitting: Family Medicine

## 2023-08-13 VITALS — BP 156/93 | HR 83 | Temp 98.0°F | Ht 64.0 in | Wt 249.0 lb

## 2023-08-13 DIAGNOSIS — R0602 Shortness of breath: Secondary | ICD-10-CM | POA: Diagnosis not present

## 2023-08-13 DIAGNOSIS — E669 Obesity, unspecified: Secondary | ICD-10-CM

## 2023-08-13 DIAGNOSIS — R5383 Other fatigue: Secondary | ICD-10-CM

## 2023-08-13 DIAGNOSIS — Z1331 Encounter for screening for depression: Secondary | ICD-10-CM | POA: Diagnosis not present

## 2023-08-13 DIAGNOSIS — R739 Hyperglycemia, unspecified: Secondary | ICD-10-CM

## 2023-08-13 DIAGNOSIS — E782 Mixed hyperlipidemia: Secondary | ICD-10-CM | POA: Diagnosis not present

## 2023-08-13 DIAGNOSIS — Z6841 Body Mass Index (BMI) 40.0 and over, adult: Secondary | ICD-10-CM

## 2023-08-13 DIAGNOSIS — Z9189 Other specified personal risk factors, not elsewhere classified: Secondary | ICD-10-CM

## 2023-08-13 NOTE — Progress Notes (Signed)
Chief Complaint:   OBESITY Debbie [name] (MR# [id number]) is a 42 y.o. female who presents for evaluation and treatment of obesity and related comorbidities. Current BMI is Body mass index is 42.74 kg/m. Debbie [name] has been struggling with her weight for many years and has been unsuccessful in either losing weight, maintaining weight loss, or reaching her healthy weight goal.  Debbie [name] is currently in the action stage of change and ready to dedicate time achieving and maintaining a healthier weight. Debbie [name] is interested in becoming our patient and working on intensive lifestyle modifications including (but not limited to) diet and exercise for weight loss.  Referred by OB/ Gyn and PCP.  She has a diagnosis of Debbie [name] that significantly influences her food intake during the day.  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  Works 40+ hours a week from 7-4 but sometimes works more and even on weekends.  Lives at home with husband [name], 45 year old son [name] and 51 year old son [name].  Family is supportive of her and will be eating like she eats.  No anticipating any sabotage.  Desired weight is 135lbs and last time she was that weight was when she 42 years old.  She is dealing with significant sciatic pain.  Has previously tried Keto and intermittent fasting. Lost 26lbs with IF. Doesn't really skip meals.   Food Recall: Bowl of oatmeal (1 packet of quaker oatmeal apple cinnamon or maple) or eggs (2) and 2 slices of toast and sometimes she may do cottage cheese (1/2 cup) with pineapple or peaches.  Would feel satisfied.  Eats lunch around noon and may be leftovers or peanut butter on toast (2) or cottage cheese (1/2 cup) with pinto beans and pretzel chips handful.  Feels satisfied.  Next time she eats will be 5-6pm for dinner- chicken/ turkey/ salmon/ tofu (2 chicken strips, 1 salmon filet) with rice (3/4 cup rice) or veggies (2 cups)- feels full from dinner.  Won't eat again after supper.    Debbie [name]'s habits were reviewed today and are as follows: Her family eats meals together, she thinks her family will eat healthier with her, her desired weight loss is 114 lbs, she started gaining excessive weight 3 years ago, her heaviest weight ever was 253 pounds, she has significant food cravings issues, she skips meals frequently, she is frequently drinking liquids with calories, and she struggles with emotional eating.  Depression Screen Jill[facility]'s Food and Mood (modified PHQ-9) [name] was 18.  Subjective:   1. Other fatigue Debbie [name] admits to daytime somnolence and admits to waking up still tired. Patient has a history of symptoms of daytime fatigue and morning fatigue. Debbie [name] generally gets 6 or 7 hours of sleep per night, and states that she has nightime awakenings and generally restful sleep. Debbie [name] is present. Apneic episodes are not present. Debbie [name] is 9.  EKG done on 4//2024-normal sinus rhythm at 81 bpm.  2. SOBOE (shortness of breath on exertion) Debbie [name] notes increasing shortness of breath with exercising and seems to be worsening over time with weight gain. She notes getting out of breath sooner with activity than she used to. This has not gotten worse recently. Debbie [name] denies shortness of breath at rest or orthopnea.  3. Hyperglycemia Patient has had elevated blood sugars in the past.  She has no A1c in Epic.  4. Mixed hyperlipidemia Patient's last LDL was 95 on her last check, previously taking gemfibrozil.  Her  last HDL was 34 and triglycerides 178.  5. Other Debbie [name] (HCC) Patient has been working on decreasing carbohydrates reliance to control attacks.  She has intermittent hypertension affiliated with decreased and carbohydrate intake.  6. At risk for heart disease Patient has a history of preterm preeclampsia with pregnancy 1-1/2 years ago.  She sees [facility].  Echocardiogram done in 2022 showed EF 60-65% without wall abnormalities.  Assessment/Plan:   1. Other  fatigue Debbie [name] does feel that her weight is causing her energy to be lower than it should be. Fatigue may be related to obesity, depression or many other causes. Labs will be ordered, and in the meanwhile, Debbie [name] will focus on self care including making healthy food choices, increasing physical activity and focusing on stress reduction.  - Vitamin B12 - Folate - VITAMIN D 25 Hydroxy (Vit-D Deficiency, Fractures) - TSH - T4, free - T3  2. SOBOE (shortness of breath on exertion) Debbie [name] does feel that she gets out of breath more easily that she used to when she exercises. Debbie [name]'s shortness of breath appears to be obesity related and exercise induced. She has agreed to work on weight loss and gradually increase exercise to treat her exercise induced shortness of breath. Will continue to monitor closely.  - CBC with Differential/Platelet  3. Hyperglycemia We will check labs today, we will follow-up at patient's next appointment.  - Comprehensive metabolic panel - Hemoglobin A1c - Insulin, random  4. Mixed hyperlipidemia We will check labs today, we will follow-up at patient's next appointment.  - Lipid Panel With LDL/HDL Ratio  5. Other Debbie [name] (HCC) We will follow-up on patient's symptoms at her first follow-up appointment.  6. At risk for heart disease We discussed the importance of continued follow-ups; we also discussed higher risk of CVA and MI, as well as CAD with preterm eclampsia.  7. Depression screening Debbie [name] had a positive depression screening. Depression is commonly associated with obesity and often results in emotional eating behaviors. We will monitor this closely and work on CBT to help improve the non-hunger eating patterns. Referral to Psychology may be required if no improvement is seen as she continues in our clinic.  8. BMI 40.0-44.9, adult (HCC)  9. Obesity with starting BMI of 42.8 Debbie [name] is currently in the action stage of change and her goal is to continue with  weight loss efforts. I recommend [name] begin the structured treatment plan as follows:  She has agreed to the Category 3 Plan.  Exercise goals: No exercise has been prescribed at this time.   Behavioral modification strategies: increasing lean protein intake, meal planning and cooking strategies, keeping healthy foods in the home, and planning for success.  She was informed of the importance of frequent follow-up visits to maximize her success with intensive lifestyle modifications for her multiple health conditions. She was informed we would discuss her lab results at her next visit unless there is a critical issue that needs to be addressed sooner. Marua agreed to keep her next visit at the agreed upon time to discuss these results.  Objective:   Blood pressure (!) 156/93, pulse 83, temperature 98 F (36.7 C), height 5\' 4"  (1.626 m), weight 249 lb (112.9 kg), last menstrual period 08/20/2022, SpO2 99%, not currently breastfeeding. Body mass index is 42.74 kg/m.  EKG: Normal sinus rhythm, rate 81 BPM.  Indirect Calorimeter completed today shows a VO2 of 291 and a REE of 2002.  Her calculated basal metabolic rate is 1610 thus her basal metabolic  rate is better than expected.  General: Cooperative, alert, well developed, in no acute distress. HEENT: Conjunctivae and lids unremarkable. Cardiovascular: Regular rhythm.  Lungs: Normal work of breathing. Neurologic: No focal deficits.   Lab Results  Component Value Date   CREATININE 0.93 08/13/2023   BUN 10 08/13/2023   NA 141 08/13/2023   K 4.8 08/13/2023   CL 100 08/13/2023   CO2 22 08/13/2023   Lab Results  Component Value Date   ALT 39 (H) 08/13/2023   AST 33 08/13/2023   ALKPHOS 95 08/13/2023   BILITOT 0.5 08/13/2023   Lab Results  Component Value Date   HGBA1C 6.1 (H) 08/13/2023   Lab Results  Component Value Date   INSULIN 40.5 (H) 08/13/2023   Lab Results  Component Value Date   TSH 0.423 (L) 08/13/2023   Lab  Results  Component Value Date   CHOL 231 (H) 08/13/2023   HDL 35 (L) 08/13/2023   LDLCALC 145 (H) 08/13/2023   TRIG 276 (H) 08/13/2023   CHOLHDL 4.7 (H) 02/06/2023   Lab Results  Component Value Date   WBC 8.4 08/13/2023   HGB 15.4 08/13/2023   HCT 47.1 (H) 08/13/2023   MCV 86 08/13/2023   PLT 326 08/13/2023   No results found for: "IRON", "TIBC", "FERRITIN"  Attestation Statements:   Reviewed by clinician on day of visit: allergies, medications, problem list, medical history, surgical history, family history, social history, and previous encounter notes.  Time spent on visit including pre-visit chart review and post-visit charting and care was 40 minutes.   I, Burt Knack, am acting as transcriptionist for Reuben Likes, MD.  This is the patient's first visit at [facility]. The patient's NEW PATIENT PACKET was reviewed at length. Included in the packet: current and past health history, medications, allergies, ROS, gynecologic history (women only), surgical history, family history, social history, weight history, weight loss surgery history (for those that have had weight loss surgery), nutritional evaluation, mood and food questionnaire, PHQ9, Debbie questionnaire, sleep habits questionnaire, patient life and health improvement goals questionnaire. These will all be scanned into the patient's chart under media.   During the visit, I independently reviewed the patient's EKG, bioimpedance scale results, and indirect calorimeter results. I used this information to tailor a meal plan for the patient that will help her to lose weight and will improve her obesity-related conditions going forward. I performed a medically necessary appropriate examination and/or evaluation. I discussed the assessment and treatment plan with the patient. The patient was provided an opportunity to ask questions and all were answered. The patient agreed with the plan and demonstrated an  understanding of the instructions. Labs were ordered at this visit and will be reviewed at the next visit unless more critical results need to be addressed immediately. Clinical information was updated and documented in the EMR.    I have reviewed the above documentation for accuracy and completeness, and I agree with the above. - Reuben Likes, MD

## 2023-08-14 LAB — CBC WITH DIFFERENTIAL/PLATELET
Basophils Absolute: 0.1 10*3/uL (ref 0.0–0.2)
Basos: 1 %
EOS (ABSOLUTE): 0.2 10*3/uL (ref 0.0–0.4)
Eos: 2 %
Hematocrit: 47.1 % — ABNORMAL HIGH (ref 34.0–46.6)
Hemoglobin: 15.4 g/dL (ref 11.1–15.9)
Immature Grans (Abs): 0 10*3/uL (ref 0.0–0.1)
Immature Granulocytes: 0 %
Lymphocytes Absolute: 3.5 10*3/uL — ABNORMAL HIGH (ref 0.7–3.1)
Lymphs: 42 %
MCH: 27.9 pg (ref 26.6–33.0)
MCHC: 32.7 g/dL (ref 31.5–35.7)
MCV: 86 fL (ref 79–97)
Monocytes Absolute: 0.5 10*3/uL (ref 0.1–0.9)
Monocytes: 6 %
Neutrophils Absolute: 4.1 10*3/uL (ref 1.4–7.0)
Neutrophils: 49 %
Platelets: 326 10*3/uL (ref 150–450)
RBC: 5.51 x10E6/uL — ABNORMAL HIGH (ref 3.77–5.28)
RDW: 12.8 % (ref 11.7–15.4)
WBC: 8.4 10*3/uL (ref 3.4–10.8)

## 2023-08-14 LAB — COMPREHENSIVE METABOLIC PANEL
ALT: 39 IU/L — ABNORMAL HIGH (ref 0–32)
AST: 33 IU/L (ref 0–40)
Albumin: 4.6 g/dL (ref 3.9–4.9)
Alkaline Phosphatase: 95 IU/L (ref 44–121)
BUN/Creatinine Ratio: 11 (ref 9–23)
BUN: 10 mg/dL (ref 6–24)
Bilirubin Total: 0.5 mg/dL (ref 0.0–1.2)
CO2: 22 mmol/L (ref 20–29)
Calcium: 9.8 mg/dL (ref 8.7–10.2)
Chloride: 100 mmol/L (ref 96–106)
Creatinine, Ser: 0.93 mg/dL (ref 0.57–1.00)
Globulin, Total: 2.8 g/dL (ref 1.5–4.5)
Glucose: 84 mg/dL (ref 70–99)
Potassium: 4.8 mmol/L (ref 3.5–5.2)
Sodium: 141 mmol/L (ref 134–144)
Total Protein: 7.4 g/dL (ref 6.0–8.5)
eGFR: 79 mL/min/{1.73_m2} (ref >59)

## 2023-08-14 LAB — LIPID PANEL WITH LDL/HDL RATIO
Cholesterol, Total: 231 mg/dL — ABNORMAL HIGH (ref 100–199)
HDL: 35 mg/dL — ABNORMAL LOW (ref >39)
LDL Chol Calc (NIH): 145 mg/dL — ABNORMAL HIGH (ref 0–99)
LDL/HDL Ratio: 4.1 ratio — ABNORMAL HIGH (ref 0.0–3.2)
Triglycerides: 276 mg/dL — ABNORMAL HIGH (ref 0–149)
VLDL Cholesterol Cal: 51 mg/dL — ABNORMAL HIGH (ref 5–40)

## 2023-08-14 LAB — HEMOGLOBIN A1C
Est. average glucose Bld gHb Est-mCnc: 128 mg/dL
Hgb A1c MFr Bld: 6.1 % — ABNORMAL HIGH (ref 4.8–5.6)

## 2023-08-14 LAB — VITAMIN B12: Vitamin B-12: 644 pg/mL (ref 232–1245)

## 2023-08-14 LAB — TSH: TSH: 0.423 u[IU]/mL — ABNORMAL LOW (ref 0.450–4.500)

## 2023-08-14 LAB — FOLATE: Folate: 16 ng/mL (ref >3.0)

## 2023-08-14 LAB — T3: T3, Total: 155 ng/dL (ref 71–180)

## 2023-08-14 LAB — INSULIN, RANDOM: INSULIN: 40.5 u[IU]/mL — ABNORMAL HIGH (ref 2.6–24.9)

## 2023-08-14 LAB — T4, FREE: Free T4: 0.93 ng/dL (ref 0.82–1.77)

## 2023-08-14 LAB — VITAMIN D 25 HYDROXY (VIT D DEFICIENCY, FRACTURES): Vit D, 25-Hydroxy: 43.1 ng/mL (ref 30.0–100.0)

## 2023-08-18 NOTE — Progress Notes (Unsigned)
[name] [address] [place] [address] Phone: [phone] Subjective:   Bruce Donath, am serving as a scribe for Dr. Antoine Primas.  I'm seeing this patient by the request  of:  Lorenda Ishihara, MD  CC: Neck and back pain follow-up  [id number]  Debbie [name] is a 42 y.o. female coming in with complaint of back and neck pain. OMT on 04/23/2023. Patient states that she is having pain in lower back. Trying to stretch and do squats which is not helping. Taking 2 zanaflex at night. Going to see weight loss specialist.   Medications patient has been prescribed: Zanaflex  Taking:  Patient recently did see primary care and did have laboratory workup.  TSH is within relatively normal range, normal T3-T4, vitamin D 43, B12 644       Reviewed prior external information including notes and imaging from previsou exam, outside providers and external EMR if available.   As well as notes that were available from care everywhere and other healthcare systems.  Past medical history, social, surgical and family history all reviewed in electronic medical record.  No pertanent information unless stated regarding to the chief complaint.   Past Medical History:  Diagnosis Date   ADD (attention deficit disorder)    Anxiety    self reported   Arthritis    in spine   Back pain    Chronic back pain 2021   low back/hip; had nerve ablation in lumbar/sacral joint   Constipation    Depression    self reported   Heartburn    High cholesterol    History of hiatal hernia    History of stomach ulcers    Hypertension    Joint pain    Maxillary sinus cyst    left   Menopause    Migraine    Multiple thyroid nodules    3; 1.9 cm and 2 cm, 0.6 cm, will see general  surgeon   No pertinent past medical history    Ovarian cyst    Porphyria (HCC)    Shoulder pain    Swallowing difficulty    UTI (urinary tract infection)     Allergies  Allergen  Reactions   Tylenol [Acetaminophen] Anaphylaxis   Aspartame    Aspirin Hives    Unknown childhood rxn   Benadryl [Diphenhydramine Hcl] Itching   Buspar [Buspirone] Other (See Comments)    migraines   Celexa [Citalopram Hydrobromide] Other (See Comments)    agitation and dizziness   Dilaudid [Hydromorphone Hcl] Itching    Pill only   Effexor Xr [Venlafaxine]     Other reaction(s): More depressed (11/2017-neurology)   Morphine    Penicillins     hives   Prednisone     Swelling, hives   Stevia Glycerite Extract [Flavoring Agent]    Sucralose    Sulfamethoxazole-Trimethoprim     Other reaction(s): swelling (2017)   Zoloft [Sertraline]     Other reaction(s): nausea and swelling   Latex Rash     Review of Systems:  No headache, visual changes, nausea, vomiting, diarrhea, constipation, dizziness, abdominal pain, skin rash, fevers, chills, night sweats, weight loss, swollen lymph nodes, body aches, joint swelling, chest pain, shortness of breath, mood changes. POSITIVE muscle aches  Objective  Blood pressure 112/82, pulse 86, height 5\' 4"  (1.626 m), last menstrual period 08/20/2022, SpO2 96%, not currently breastfeeding.   General: No apparent distress alert and oriented x3 mood and affect normal, dressed  appropriately.  HEENT: Pupils equal, extraocular movements intact  Respiratory: Patient's speak in full sentences and does not appear short of breath  Cardiovascular: No lower extremity edema, non tender, no erythema  Gait normal MSK:  Back  Low back exam shows patient does have some loss lordosis noted.  Some limited sidebending bilaterally.  Patient does have tightness noted of the paraspinal musculature.  Osteopathic findings  C7 flexed rotated and side bent right T3 extended rotated and side bent right inhaled rib T6 extended rotated and side bent left L2 flexed rotated and side bent right L4 flexed rotated and side bent left Sacrum right on right    Assessment and  Plan:  Degenerative disc disease, lumbar Known some degenerative disc disease.  Discussed with patient about icing regimen and home exercises.  Has done relatively well with osteopathic manipulation and did have some movement today but continued to have some muscle spasms.  Patient given Toradol injection and hopefully this will be beneficial.  We did discuss the possibility of Celebrex but patient did not want it.  Given Relafen and warned of potential side effects.  See how patient does on it.  Patient wanted to avoid any type of steroid at this moment.  Follow-up with me again 6 to 8 weeks but if worsening pain go to the emergency room.    Nonallopathic problems  Decision today to treat with OMT was based on Physical Exam  After verbal consent patient was treated with HVLA, ME, FPR techniques in cervical, rib, thoracic, lumbar, and sacral  areas  Patient tolerated the procedure well with improvement in symptoms  Patient given exercises, stretches and lifestyle modifications  See medications in patient instructions if given  Patient will follow up in 4-8 weeks     The above documentation has been reviewed and is accurate and complete Judi Saa, DO       Note: This dictation was prepared with Dragon dictation along with smaller phrase technology. Any transcriptional errors that result from this process are unintentional.

## 2023-08-19 ENCOUNTER — Ambulatory Visit (INDEPENDENT_AMBULATORY_CARE_PROVIDER_SITE_OTHER): Payer: BC Managed Care – PPO | Admitting: Family Medicine

## 2023-08-19 ENCOUNTER — Encounter: Payer: Self-pay | Admitting: Family Medicine

## 2023-08-19 VITALS — BP 112/82 | HR 86 | Ht 64.0 in

## 2023-08-19 DIAGNOSIS — M9908 Segmental and somatic dysfunction of rib cage: Secondary | ICD-10-CM

## 2023-08-19 DIAGNOSIS — M9902 Segmental and somatic dysfunction of thoracic region: Secondary | ICD-10-CM | POA: Diagnosis not present

## 2023-08-19 DIAGNOSIS — M9901 Segmental and somatic dysfunction of cervical region: Secondary | ICD-10-CM

## 2023-08-19 DIAGNOSIS — M9904 Segmental and somatic dysfunction of sacral region: Secondary | ICD-10-CM

## 2023-08-19 DIAGNOSIS — M9903 Segmental and somatic dysfunction of lumbar region: Secondary | ICD-10-CM | POA: Diagnosis not present

## 2023-08-19 DIAGNOSIS — M5136 Other intervertebral disc degeneration, lumbar region: Secondary | ICD-10-CM

## 2023-08-19 MED ORDER — KETOROLAC TROMETHAMINE 60 MG/2ML IM SOLN
60.0000 mg | Freq: Once | INTRAMUSCULAR | Status: AC
Start: 2023-08-19 — End: 2023-08-19
  Administered 2023-08-19: 60 mg via INTRAMUSCULAR

## 2023-08-19 MED ORDER — NABUMETONE 750 MG PO TABS: 750.0000 mg | ORAL_TABLET | Freq: Every day | ORAL | 1 refills | Status: DC

## 2023-08-19 NOTE — Assessment & Plan Note (Signed)
Known some degenerative disc disease.  Discussed with patient about icing regimen and home exercises.  Has done relatively well with osteopathic manipulation and did have some movement today but continued to have some muscle spasms.  Patient given Toradol injection and hopefully this will be beneficial.  We did discuss the possibility of Celebrex but patient did not want it.  Given Relafen and warned of potential side effects.  See how patient does on it.  Patient wanted to avoid any type of steroid at this moment.  Follow-up with me again 6 to 8 weeks but if worsening pain go to the emergency room.

## 2023-08-19 NOTE — Patient Instructions (Addendum)
Relefen once daily Start tmrw See me again in 4 weeks

## 2023-08-24 NOTE — Telephone Encounter (Signed)
Submitted renewal via cmm, status is pending. Key: [id number]

## 2023-08-25 ENCOUNTER — Telehealth: Payer: Self-pay

## 2023-08-25 NOTE — Telephone Encounter (Signed)
Received a faxed notice that insurance needs additional information to renew Botox-faxed to [facility] at [phone] Attention: Valli Glance

## 2023-08-27 ENCOUNTER — Encounter (INDEPENDENT_AMBULATORY_CARE_PROVIDER_SITE_OTHER): Payer: Self-pay | Admitting: Family Medicine

## 2023-08-27 ENCOUNTER — Ambulatory Visit (INDEPENDENT_AMBULATORY_CARE_PROVIDER_SITE_OTHER): Payer: BC Managed Care – PPO | Admitting: Family Medicine

## 2023-08-27 VITALS — BP 137/83 | HR 84 | Temp 97.9°F | Ht 64.0 in | Wt 247.0 lb

## 2023-08-27 DIAGNOSIS — E7849 Other hyperlipidemia: Secondary | ICD-10-CM

## 2023-08-27 DIAGNOSIS — E669 Obesity, unspecified: Secondary | ICD-10-CM

## 2023-08-27 DIAGNOSIS — R7989 Other specified abnormal findings of blood chemistry: Secondary | ICD-10-CM | POA: Diagnosis not present

## 2023-08-27 DIAGNOSIS — R7303 Prediabetes: Secondary | ICD-10-CM

## 2023-08-27 DIAGNOSIS — E559 Vitamin D deficiency, unspecified: Secondary | ICD-10-CM | POA: Diagnosis not present

## 2023-08-27 DIAGNOSIS — Z6841 Body Mass Index (BMI) 40.0 and over, adult: Secondary | ICD-10-CM

## 2023-08-27 NOTE — Progress Notes (Signed)
Chief Complaint:   OBESITY Trany is here to discuss her progress with her obesity treatment plan along with follow-up of her obesity related diagnoses. Debbie [name] is on the Category 3 Plan and states she is following her eating plan approximately 80% of the time. Tyeshia states she is doing 0 minutes 0 times per week.  Today's visit was #: 2 Starting weight: 249 lbs Starting date: 08/13/2023 Today's weight: 247 lbs Today's date: 08/27/2023 Total lbs lost to date: 2 Total lbs lost since last in-office visit: 2  Interim History: Patient found the quantity of food to be fairly significant.  Some days she was busy and couldn't get it all in.  Her husband helped her get all the food in.  For snack calories she is doing pear or apple and a handful of walnuts a day. She has added her other snack calories to her lunch or dinner.  Next few weeks she is volunteering at her church and then has school activities a few doctors appointments.  Doesn't feel she needs to make any changes to the plan over the next few weeks.  Subjective:   1. Other hyperlipidemia Patient was on gemfibrozil previously.  Her recent HDL was 35, triglycerides 098, and LDL 145.  I discussed labs with the patient today.  2. Abnormal TSH Patient's recent TSH was 0.423.  She is not on any thyroid altering medications.  She has a history of thyroid nodules and she sees Dr. Ines Bloomer.  I discussed labs with the patient today.  3. Prediabetes Patient's recent A1c was 6.1 and insulin 40.5.  She is not on medications.  She denies cravings or hunger.  I discussed labs with the patient today.  4. Vitamin D deficiency Patient's vitamin D level is minimally depressed.  She is not on a OTC supplementation.  Assessment/Plan:   1. Other hyperlipidemia We will repeat labs in 3 months; not at goal.  2. Abnormal TSH We will repeat TSH in 6 weeks.  3. Prediabetes Patient will continue her category 3 meal plan with no change in meal plan.  We  will repeat labs in 3 to 4 months.  4. Vitamin D deficiency Patient agreed to start OTC vitamin D 5000 IU daily.  5. BMI 40.0-44.9, adult (HCC)  6. Obesity with starting BMI of 42.8 Debbie [name] is currently in the action stage of change. As such, her goal is to continue with weight loss efforts. She has agreed to the Category 3 Plan.   Exercise goals: No exercise has been prescribed at this time.  Behavioral modification strategies: increasing lean protein intake, meal planning and cooking strategies, keeping healthy foods in the home, and planning for success.  Debbie [name] has agreed to follow-up with our clinic in 2 weeks. She was informed of the importance of frequent follow-up visits to maximize her success with intensive lifestyle modifications for her multiple health conditions.   Objective:   Blood pressure 137/83, pulse 84, temperature 97.9 F (36.6 C), height 5\' 4"  (1.626 m), weight 247 lb (112 kg), last menstrual period 08/20/2022, SpO2 98%, not currently breastfeeding. Body mass index is 42.4 kg/m.  General: Cooperative, alert, well developed, in no acute distress. HEENT: Conjunctivae and lids unremarkable. Cardiovascular: Regular rhythm.  Lungs: Normal work of breathing. Neurologic: No focal deficits.   Lab Results  Component Value Date   CREATININE 0.93 08/13/2023   BUN 10 08/13/2023   NA 141 08/13/2023   K 4.8 08/13/2023   CL 100 08/13/2023  CO2 22 08/13/2023   Lab Results  Component Value Date   ALT 39 (H) 08/13/2023   AST 33 08/13/2023   ALKPHOS 95 08/13/2023   BILITOT 0.5 08/13/2023   Lab Results  Component Value Date   HGBA1C 6.1 (H) 08/13/2023   Lab Results  Component Value Date   INSULIN 40.5 (H) 08/13/2023   Lab Results  Component Value Date   TSH 0.423 (L) 08/13/2023   Lab Results  Component Value Date   CHOL 231 (H) 08/13/2023   HDL 35 (L) 08/13/2023   LDLCALC 145 (H) 08/13/2023   TRIG 276 (H) 08/13/2023   CHOLHDL 4.7 (H) 02/06/2023   Lab  Results  Component Value Date   VD25OH 43.1 08/13/2023   Lab Results  Component Value Date   WBC 8.4 08/13/2023   HGB 15.4 08/13/2023   HCT 47.1 (H) 08/13/2023   MCV 86 08/13/2023   PLT 326 08/13/2023   No results found for: "IRON", "TIBC", "FERRITIN"  Attestation Statements:   Reviewed by clinician on day of visit: allergies, medications, problem list, medical history, surgical history, family history, social history, and previous encounter notes.  Time spent on visit including pre-visit chart review and post-visit care and charting was 45 minutes.   I, Burt Knack, am acting as transcriptionist for Reuben Likes, MD.  I have reviewed the above documentation for accuracy and completeness, and I agree with the above. - Reuben Likes, MD

## 2023-08-31 ENCOUNTER — Emergency Department (HOSPITAL_COMMUNITY): Payer: BC Managed Care – PPO

## 2023-08-31 ENCOUNTER — Other Ambulatory Visit: Payer: Self-pay

## 2023-08-31 ENCOUNTER — Emergency Department (HOSPITAL_COMMUNITY)
Admission: EM | Admit: 2023-08-31 | Discharge: 2023-08-31 | Disposition: A | Discharge status: Home / Self Care | Payer: BC Managed Care – PPO | Attending: Emergency Medicine | Admitting: Emergency Medicine

## 2023-08-31 ENCOUNTER — Encounter (HOSPITAL_COMMUNITY): Payer: Self-pay

## 2023-08-31 DIAGNOSIS — R0789 Other chest pain: Secondary | ICD-10-CM | POA: Insufficient documentation

## 2023-08-31 DIAGNOSIS — Z91040 Latex allergy status: Secondary | ICD-10-CM | POA: Insufficient documentation

## 2023-08-31 DIAGNOSIS — R079 Chest pain, unspecified: Secondary | ICD-10-CM

## 2023-08-31 DIAGNOSIS — G9089 Other disorders of autonomic nervous system: Secondary | ICD-10-CM

## 2023-08-31 DIAGNOSIS — G901 Familial dysautonomia [Riley-Day]: Secondary | ICD-10-CM | POA: Insufficient documentation

## 2023-08-31 LAB — CBC
HCT: 43 % (ref 36.0–46.0)
Hemoglobin: 14 g/dL (ref 12.0–15.0)
MCH: 27.2 pg (ref 26.0–34.0)
MCHC: 32.6 g/dL (ref 30.0–36.0)
MCV: 83.5 fL (ref 80.0–100.0)
Platelets: 318 10*3/uL (ref 150–400)
RBC: 5.15 MIL/uL — ABNORMAL HIGH (ref 3.87–5.11)
RDW: 12.6 % (ref 11.5–15.5)
WBC: 7.8 10*3/uL (ref 4.0–10.5)
nRBC: 0 % (ref 0.0–0.2)

## 2023-08-31 LAB — BASIC METABOLIC PANEL
Anion gap: 13 (ref 5–15)
BUN: 13 mg/dL (ref 6–20)
CO2: 23 mmol/L (ref 22–32)
Calcium: 9.4 mg/dL (ref 8.9–10.3)
Chloride: 100 mmol/L (ref 98–111)
Creatinine, Ser: 0.93 mg/dL (ref 0.44–1.00)
GFR, Estimated: >60 mL/min (ref >60)
Glucose, Bld: 91 mg/dL (ref 70–99)
Potassium: 3.9 mmol/L (ref 3.5–5.1)
Sodium: 136 mmol/L (ref 135–145)

## 2023-08-31 LAB — TROPONIN I (HIGH SENSITIVITY)
Troponin I (High Sensitivity): 2 ng/L (ref <18)
Troponin I (High Sensitivity): 3 ng/L (ref <18)

## 2023-08-31 NOTE — Discharge Instructions (Signed)
We saw you in the ER for the chest pain/shortness of breath/near fainting. All of our cardiac workup is normal, including labs, EKG and chest X-RAY are normal. We are not sure what is causing your discomfort, but think most likely it is porphyria related.  The workup in the ER is not complete, and you should follow up with your primary care doctor for further evaluation.

## 2023-08-31 NOTE — ED Triage Notes (Signed)
Pt c/o left sided chest pain that radiates to left shoulder and left neck, SOB, nausea started at 0800 today.

## 2023-08-31 NOTE — ED Provider Notes (Signed)
[place] EMERGENCY DEPARTMENT AT [facility] Provider Note   CSN: [id number] Arrival date & time: 08/31/23  4098     History {Add pertinent medical, surgical, social history, OB history to HPI:1} Chief Complaint  Patient presents with   Chest Pain    Debbie [name] is a 42 y.o. female.  HPI    42 year old patient comes in with chief complaint of chest pain.  Patient indicates that she was on her computer when suddenly she was driving burning pain over the left side of her chest, with dizziness, nausea, sweats.  She felt like she was going to faint.  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  She did take some glucose, and like she normally does but there was no improvement.  There is family history of CAD.  Patient has history of elevated BMI, hyperlipidemia and she is prediabetic.  No cardiac disease history for the patient.  She denies any substance use disorder.  Home Medications Prior to Admission medications   Medication Sig Start Date End Date Taking? Authorizing Provider  botulinum toxin Type A (BOTOX) 200 units injection Inject 155 Units into the muscle every 3 (three) months. Inject 155units to head and neck intramuscularly every 3 months. 09/16/22   Lomax, Amy, NP  ibuprofen (ADVIL) 600 MG tablet Take 1 tablet (600 mg total) by mouth every 6 (six) hours as needed. 02/05/23   Gerald Leitz, MD  Magnesium 500 MG CAPS Take 500 mg by mouth.    [provider]  pantoprazole (PROTONIX) 40 MG tablet Take 40 mg by mouth daily. 02/02/23   [provider]  tiZANidine (ZANAFLEX) 4 MG tablet Take 1 tablet (4 mg total) by mouth at bedtime as needed for muscle spasms. 07/21/23   Judi Saa, DO      Allergies    Tylenol [acetaminophen], Aspartame, Aspirin, Benadryl [diphenhydramine hcl], Buspar [buspirone], Celexa [citalopram hydrobromide], Dilaudid [hydromorphone hcl], Effexor xr [venlafaxine], Morphine, Penicillins,  Prednisone, Stevia glycerite extract [flavoring agent], Sucralose, Sulfamethoxazole-trimethoprim, Zoloft [sertraline], and Latex    Review of Systems   Review of Systems  All other systems reviewed and are negative.   Physical Exam Updated Vital Signs BP 116/73   Pulse 70   Temp 97.7 F (36.5 C) (Oral)   Resp 20   Ht 5\' 4"  (1.626 m)   Wt 112 kg   LMP 08/20/2022 (Exact Date) Comment: currently menstruating  SpO2 100%   BMI 42.38 kg/m  Physical Exam Vitals and nursing note reviewed.  Constitutional:      Appearance: She is well-developed.  HENT:     Head: Atraumatic.  Cardiovascular:     Rate and Rhythm: Normal rate.  Pulmonary:     Effort: Pulmonary effort is normal.  Musculoskeletal:     Cervical back: Normal range of motion and neck supple.  Skin:    General: Skin is warm and dry.  Neurological:     Mental Status: She is alert and oriented to person, place, and time.     ED Results / Procedures / Treatments   Labs (all labs ordered are listed, but only abnormal results are displayed) Labs Reviewed  CBC - Abnormal; Notable for the following components:      Result Value   RBC 5.15 (*)    All other components within normal limits  BASIC METABOLIC PANEL  TROPONIN I (HIGH SENSITIVITY)  TROPONIN I (HIGH SENSITIVITY)    EKG EKG Interpretation Date/Time:  Monday August 31 2023 09:20:47 EDT Ventricular Rate:  69 PR Interval:  150 QRS Duration:  80 QT Interval:  390 QTC Calculation: 417 R Axis:   97  Text Interpretation: Normal sinus rhythm Rightward axis Low voltage QRS Cannot rule out Anterior infarct , age undetermined Abnormal ECG When compared with ECG of 26-Feb-2023 23:43, PREVIOUS ECG IS PRESENT No acute changes Confirmed by Derwood Kaplan ([id number]) on 08/31/2023 11:40:33 AM  Radiology DG Chest 2 View  Result Date: 08/31/2023 CLINICAL DATA:  Chest pain. EXAM: CHEST - 2 VIEW COMPARISON:  Chest radiograph dated 07/15/2021. FINDINGS: The heart size and  mediastinal contours are within normal limits. Mild left basilar atelectasis. No focal consolidation, pneumothorax, or pleural effusion. No acute osseous abnormality. IMPRESSION: Mild left basilar atelectasis. Otherwise, no active cardiopulmonary disease. Electronically Signed   By: Hart Robinsons M.D.   On: 08/31/2023 10:55    Procedures Procedures  {Document cardiac monitor, telemetry assessment procedure when appropriate:1}  Medications Ordered in ED Medications - No data to display  ED Course/ Medical Decision Making/ A&P   {   Click here for ABCD2, HEART and other calculatorsREFRESH Note before signing :1}                              Medical Decision Making Amount and/or Complexity of Data Reviewed Labs: ordered. Radiology: ordered.  This patient presents to the ED with chief complaint(s) of chest pain, near fainting with pertinent past medical history of porphyria with similar symptoms, prediabetes, hyperlipidemia, BMI over 30.The complaint involves an extensive differential diagnosis and also carries with it a high risk of complications and morbidity.    The differential diagnosis considered for this patient includes  Dysautonomia because of porphyria, vasovagal episode, orthostasis, acute coronary syndrome, arrhythmia/  The initial plan is to get basic labs including delta troponin. Patient is PERC negative, no PE workup indicated at this time.  Her chest pain has resolved after an hour.   Independent labs interpretation:  The following labs were independently interpreted:   Independent visualization and interpretation of imaging: - I independently visualized the following imaging with scope of interpretation limited to determining acute life threatening conditions related to emergency care: X-ray of the chest, which revealed no evidence of pneumothorax.  Treatment and Reassessment: The patient appears reasonably screened and/or stabilized for discharge and I doubt any  other medical condition or other [facility] requiring further screening, evaluation, or treatment in the ED at this time prior to discharge.   Results from the ER workup discussed with the patient face to face and all questions answered to the best of my ability. The patient is safe for discharge with strict return precautions.   Final Clinical Impression(s) / ED Diagnoses Final diagnoses:  Secondary dysautonomia  Porphyria, unspecified porphyria type (HCC)  Nonspecific chest pain    Rx / DC Orders ED Discharge Orders     None

## 2023-09-01 ENCOUNTER — Encounter (INDEPENDENT_AMBULATORY_CARE_PROVIDER_SITE_OTHER): Payer: Self-pay | Admitting: Family Medicine

## 2023-09-01 NOTE — Telephone Encounter (Signed)
Please review

## 2023-09-15 NOTE — Progress Notes (Deleted)
[name] [address] [place] [address] Phone: [phone] Subjective:    I'm seeing this patient by the request  of:  Lorenda Ishihara, MD  CC: Back and neck pain follow-up  [id number]  Debbie [name] is a 42 y.o. female coming in with complaint of back and neck pain. OMT on 08/19/2023. Patient states   Medications patient has been prescribed: Zanaflex  Taking:      Since we have seen patient was seen in the emergency room 3 weeks ago.   Reviewed prior external information including notes and imaging from previsou exam, outside providers and external EMR if available.   As well as notes that were available from care everywhere and other healthcare systems.  Past medical history, social, surgical and family history all reviewed in electronic medical record.  No pertanent information unless stated regarding to the chief complaint.   Past Medical History:  Diagnosis Date   ADD (attention deficit disorder)    Anxiety    self reported   Arthritis    in spine   Back pain    Chronic back pain 2021   low back/hip; had nerve ablation in lumbar/sacral joint   Constipation    Depression    self reported   Heartburn    High cholesterol    History of hiatal hernia    History of stomach ulcers    Hypertension    Joint pain    Maxillary sinus cyst    left   Menopause    Migraine    Multiple thyroid nodules    3; 1.9 cm and 2 cm, 0.6 cm, will see general  surgeon   No pertinent past medical history    Ovarian cyst    Porphyria (HCC)    Shoulder pain    Swallowing difficulty    UTI (urinary tract infection)     Allergies  Allergen Reactions   Tylenol [Acetaminophen] Anaphylaxis   Aspartame    Aspirin Hives    Unknown childhood rxn   Benadryl [Diphenhydramine Hcl] Itching   Buspar [Buspirone] Other (See Comments)    migraines   Celexa [Citalopram Hydrobromide] Other (See Comments)    agitation and dizziness    Dilaudid [Hydromorphone Hcl] Itching    Pill only   Effexor Xr [Venlafaxine]     Other reaction(s): More depressed (11/2017-neurology)   Morphine    Penicillins     hives   Prednisone     Swelling, hives   Stevia Glycerite Extract [Flavoring Agent]    Sucralose    Sulfamethoxazole-Trimethoprim     Other reaction(s): swelling (2017)   Zoloft [Sertraline]     Other reaction(s): nausea and swelling   Latex Rash     Review of Systems:  No headache, visual changes, nausea, vomiting, diarrhea, constipation, dizziness, abdominal pain, skin rash, fevers, chills, night sweats, weight loss, swollen lymph nodes, body aches, joint swelling, chest pain, shortness of breath, mood changes. POSITIVE muscle aches  Objective  Last menstrual period 08/20/2022, not currently breastfeeding.   General: No apparent distress alert and oriented x3 mood and affect normal, dressed appropriately.  HEENT: Pupils equal, extraocular movements intact  Respiratory: Patient's speak in full sentences and does not appear short of breath  Cardiovascular: No lower extremity edema, non tender, no erythema   Osteopathic findings  C2 flexed rotated and side bent right C6 flexed rotated and side bent left T3 extended rotated and side bent right inhaled rib T9 extended rotated and  side bent left L2 flexed rotated and side bent right Sacrum right on right    Assessment and Plan:  No problem-specific Assessment & Plan notes found for this encounter.    Nonallopathic problems  Decision today to treat with OMT was based on Physical Exam  After verbal consent patient was treated with HVLA, ME, FPR techniques in cervical, rib, thoracic, lumbar, and sacral  areas  Patient tolerated the procedure well with improvement in symptoms  Patient given exercises, stretches and lifestyle modifications  See medications in patient instructions if given  Patient will follow up in 4-8 weeks      The above documentation  has been reviewed and is accurate and complete Judi Saa, DO        Note: This dictation was prepared with Dragon dictation along with smaller phrase technology. Any transcriptional errors that result from this process are unintentional.

## 2023-09-16 ENCOUNTER — Encounter (INDEPENDENT_AMBULATORY_CARE_PROVIDER_SITE_OTHER): Payer: Self-pay | Admitting: Family Medicine

## 2023-09-16 ENCOUNTER — Ambulatory Visit: Payer: BC Managed Care – PPO | Admitting: Family Medicine

## 2023-09-16 ENCOUNTER — Ambulatory Visit (INDEPENDENT_AMBULATORY_CARE_PROVIDER_SITE_OTHER): Payer: BC Managed Care – PPO | Admitting: Family Medicine

## 2023-09-16 VITALS — BP 113/74 | HR 88 | Temp 97.4°F | Ht 64.0 in | Wt 249.0 lb

## 2023-09-16 DIAGNOSIS — M51362 Other intervertebral disc degeneration, lumbar region with discogenic back pain and lower extremity pain: Secondary | ICD-10-CM

## 2023-09-16 DIAGNOSIS — R7303 Prediabetes: Secondary | ICD-10-CM

## 2023-09-16 DIAGNOSIS — E669 Obesity, unspecified: Secondary | ICD-10-CM

## 2023-09-16 DIAGNOSIS — E7849 Other hyperlipidemia: Secondary | ICD-10-CM | POA: Diagnosis not present

## 2023-09-16 DIAGNOSIS — Z6841 Body Mass Index (BMI) 40.0 and over, adult: Secondary | ICD-10-CM

## 2023-09-16 NOTE — Progress Notes (Unsigned)
Chief Complaint:   OBESITY Debbie [name] is here to discuss her progress with her obesity treatment plan along with follow-up of her obesity related diagnoses. Debbie [name] is on the Category 3 Plan and states she is following her eating plan approximately 95% of the time. Debbie [name] states she has been doing some walking.    Today's visit was #: 3 Starting weight: 249 lbs Starting date: 08/13/2023 Today's weight: 249 lbs Today's date: 09/16/2023 Total lbs lost to date: 0 Total lbs lost since last in-office visit: 0  Interim History: Patient voices that she has had a porphyria attack since last appointment.  She mentions she struggles with carb cutting due to higher likelihood of an attack.  She has to eat rice at dinner and 3 pieces of natures own butter bread in order to stay in control.  Her snacks have been cottage cheese and frozen berries with walnuts and then yogurt and maple syrup. If she doesn't eat it she will freeze those options to make an ice cream type option.   Subjective:   1. Prediabetes Patient's last A1c was 6.1 and insulin 40.5.  She is not on medications.  She notes she has been limited in carbohydrate intake alteration due to porphyria.   2. Other hyperlipidemia Patient's last LDL was 145, triglycerides 276, and HDL 35.  She is not on statin.  Assessment/Plan:   1. Prediabetes Patient is to continue with her meal plan with monitoring of her total carbohydrate intake and staying within 300 cal for snacks.  2. Other hyperlipidemia We will repeat labs in 2 months in January.  3. BMI 40.0-44.9, adult (HCC)  4. Obesity with starting BMI of 42.8 Debbie [name] is currently in the action stage of change. As such, her goal is to continue with weight loss efforts. She has agreed to the Category 3 Plan.   Exercise goals: All adults should avoid inactivity. Some physical activity is better than none, and adults who participate in any amount of physical activity gain some health  benefits.  Behavioral modification strategies: increasing lean protein intake, meal planning and cooking strategies, keeping healthy foods in the home, and planning for success.  Debbie [name] has agreed to follow-up with our clinic in 3 weeks. She was informed of the importance of frequent follow-up visits to maximize her success with intensive lifestyle modifications for her multiple health conditions.   Objective:   Blood pressure 113/74, pulse 88, temperature (!) 97.4 F (36.3 C), height 5\' 4"  (1.626 m), weight 249 lb (112.9 kg), last menstrual period 08/20/2022, SpO2 97%, not currently breastfeeding. Body mass index is 42.74 kg/m.  General: Cooperative, alert, well developed, in no acute distress. HEENT: Conjunctivae and lids unremarkable. Cardiovascular: Regular rhythm.  Lungs: Normal work of breathing. Neurologic: No focal deficits.   Lab Results  Component Value Date   CREATININE 0.93 08/31/2023   BUN 13 08/31/2023   NA 136 08/31/2023   K 3.9 08/31/2023   CL 100 08/31/2023   CO2 23 08/31/2023   Lab Results  Component Value Date   ALT 39 (H) 08/13/2023   AST 33 08/13/2023   ALKPHOS 95 08/13/2023   BILITOT 0.5 08/13/2023   Lab Results  Component Value Date   HGBA1C 6.1 (H) 08/13/2023   Lab Results  Component Value Date   INSULIN 40.5 (H) 08/13/2023   Lab Results  Component Value Date   TSH 0.423 (L) 08/13/2023   Lab Results  Component Value Date   CHOL 231 (H) 08/13/2023  HDL 35 (L) 08/13/2023   LDLCALC 145 (H) 08/13/2023   TRIG 276 (H) 08/13/2023   CHOLHDL 4.7 (H) 02/06/2023   Lab Results  Component Value Date   VD25OH 43.1 08/13/2023   Lab Results  Component Value Date   WBC 7.8 08/31/2023   HGB 14.0 08/31/2023   HCT 43.0 08/31/2023   MCV 83.5 08/31/2023   PLT 318 08/31/2023   No results found for: "IRON", "TIBC", "FERRITIN"  Attestation Statements:   Reviewed by clinician on day of visit: allergies, medications, problem list, medical history,  surgical history, family history, social history, and previous encounter notes.   I, Burt Knack, am acting as transcriptionist for Reuben Likes, MD.  I have reviewed the above documentation for accuracy and completeness, and I agree with the above. - Reuben Likes, MD

## 2023-09-23 ENCOUNTER — Encounter: Payer: Self-pay | Admitting: Family Medicine

## 2023-09-24 NOTE — Progress Notes (Signed)
[name] [address] [place] [address] Phone: [phone] Subjective:   Debbie [name], am serving as a scribe for Dr. Antoine Primas.  I'm seeing this patient by the request  of:  Lorenda Ishihara, MD  CC: Low back  [id number]  Debbie [name] is a 42 y.o. female coming in with complaint of back and neck pain. OMT 08/19/2023. Patient states same per usual. Zanaflex refilled. Wants to know about surgery.  Feels like she is always with some type of pain and is unable to lose weight.  Patient does have the prophy area which makes it impossible to do a lot of different medications.  He is unable to lose weight and feels like until she can become activities will be significantly difficult.  Medications patient has been prescribed: Zanaflex          Reviewed prior external information including notes and imaging from previsou exam, outside providers and external EMR if available.   As well as notes that were available from care everywhere and other healthcare systems.  Past medical history, social, surgical and family history all reviewed in electronic medical record.  No pertanent information unless stated regarding to the chief complaint.   Past Medical History:  Diagnosis Date   ADD (attention deficit disorder)    Anxiety    self reported   Arthritis    in spine   Back pain    Chronic back pain 2021   low back/hip; had nerve ablation in lumbar/sacral joint   Constipation    Depression    self reported   Heartburn    High cholesterol    History of hiatal hernia    History of stomach ulcers    Hypertension    Joint pain    Maxillary sinus cyst    left   Menopause    Migraine    Multiple thyroid nodules    3; 1.9 cm and 2 cm, 0.6 cm, will see general  surgeon   No pertinent past medical history    Ovarian cyst    Porphyria (HCC)    Shoulder pain    Swallowing difficulty    UTI (urinary tract infection)      Allergies  Allergen Reactions   Tylenol [Acetaminophen] Anaphylaxis   Aspartame    Aspirin Hives    Unknown childhood rxn   Benadryl [Diphenhydramine Hcl] Itching   Buspar [Buspirone] Other (See Comments)    migraines   Celexa [Citalopram Hydrobromide] Other (See Comments)    agitation and dizziness   Dilaudid [Hydromorphone Hcl] Itching    Pill only   Effexor Xr [Venlafaxine]     Other reaction(s): More depressed (11/2017-neurology)   Morphine    Penicillins     hives   Prednisone     Swelling, hives   Stevia Glycerite Extract [Flavoring Agent]    Sucralose    Sulfamethoxazole-Trimethoprim     Other reaction(s): swelling (2017)   Zoloft [Sertraline]     Other reaction(s): nausea and swelling   Latex Rash     Review of Systems:  No headache, visual changes, nausea, vomiting, diarrhea, constipation, dizziness, abdominal pain, skin rash, fevers, chills, night sweats, weight loss, swollen lymph nodes, body aches, joint swelling, chest pain, shortness of breath, mood changes. POSITIVE muscle aches  Objective  Blood pressure 126/82, pulse 95, height 5\' 4"  (1.626 m), weight 251 lb (113.9 kg), last menstrual period 08/20/2022, SpO2 97%, not currently breastfeeding.   General: No apparent  distress alert and oriented x3 mood and affect normal, dressed appropriately.  HEENT: Pupils equal, extraocular movements intact  Respiratory: Patient's speak in full sentences and does not appear short of breath  Cardiovascular: No lower extremity edema, non tender, no erythema  Patient lives in a significant mount discomfort back.  Does have tightness within the knee positive straight leg test noted.  Worsening pain with extension of the back.   Osteopathic findings  C2 flexed rotated and side bent right C6 flexed rotated and side bent left T3 extended rotated and side bent right inhaled rib T9 extended rotated and side bent left       Assessment and Plan:  Degenerative disc  disease, lumbar Worsening pain of the low back pain.  Has failed multiple rounds of formal physical therapy, discussed gabapentin again.  Discussed different treatment options including osteopathic manipulation which has not made a significant difference.  Previous x-rays have shown progression of arthritis.  Family history where all of her siblings and mother has had back surgery.  Patient feels like it is affecting all daily activities including her sleep as well as how she accepts a mother.  Patient would like a MRI to further evaluate with her failing everything else at this time.  Depending on how patient does or what is going on imaging we will discuss the possibility of injection or would we need referral to neurosurgery to discuss further.    Nonallopathic problems  Decision today to treat with OMT was based on Physical Exam  After verbal consent patient was treated with HVLA, ME, FPR techniques in cervical, rib, thoracic,areas deferred back exam secondary to the amount of pain  Patient tolerated the procedure well with improvement in symptoms  Patient given exercises, stretches and lifestyle modifications  See medications in patient instructions if given  Patient will follow up in 4-8 weeks     The above documentation has been reviewed and is accurate and complete Judi Saa, DO         Note: This dictation was prepared with Dragon dictation along with smaller phrase technology. Any transcriptional errors that result from this process are unintentional.

## 2023-09-28 NOTE — Patient Instructions (Signed)
Below is our plan:  We will x every 12-16 weeks and Nurtec as needed.   Please make sure you are staying well hydrated. I recommend 50-60 ounces daily. Well balanced diet and regular exercise encouraged. Consistent sleep schedule with 6-8 hours recommended.   Please continue follow up with care team as directed.   Follow up with me in 1 year   You may receive a survey regarding today's visit. I encourage you to leave honest feed back as I do use this information to improve patient care. Thank you for seeing me today!     GENERAL HEADACHE INFORMATION:   Natural supplements: Magnesium Oxide or Magnesium Glycinate 500 mg at bed (up to 800 mg daily) Coenzyme Q10 300 mg in AM Vitamin B2- 200 mg twice a day   Add 1 supplement at a time since even natural supplements can have undesirable side effects. You can sometimes buy supplements cheaper (especially Coenzyme Q10) at www.WebmailGuide.co.za or at [facility].  Migraine with aura: There is increased risk for stroke in women with migraine with aura and a contraindication for the combined contraceptive pill for use by women who have migraine with aura. The risk for women with migraine without aura is lower. However other risk factors like smoking are far more likely to increase stroke risk than migraine. There is a recommendation for no smoking and for the use of OCPs without estrogen such as progestogen only pills particularly for women with migraine with aura.Marland Kitchen People who have migraine headaches with auras may be 3 times more likely to have a stroke caused by a blood clot, compared to migraine patients who don't see auras. Women who take hormone-replacement therapy may be 30 percent more likely to suffer a clot-based stroke than women not taking medication containing estrogen. Other risk factors like smoking and high blood pressure may be  much more important.    Vitamins and herbs that show potential:   Magnesium: Magnesium (250 mg twice a day or 500 mg at  bed) has a relaxant effect on smooth muscles such as blood vessels. Individuals suffering from frequent or daily headache usually have low magnesium levels which can be increase with daily supplementation of 400-750 mg. Three trials found 40-90% average headache reduction  when used as a preventative. Magnesium may help with headaches are aura, the best evidence for magnesium is for migraine with aura is its thought to stop the cortical spreading depression we believe is the pathophysiology of migraine aura.Magnesium also demonstrated the benefit in menstrually related migraine.  Magnesium is part of the messenger system in the serotonin cascade and it is a good muscle relaxant.  It is also useful for constipation which can be a side effect of other medications used to treat migraine. Good sources include nuts, whole grains, and tomatoes. Side Effects: loose stool/diarrhea  Riboflavin (vitamin B 2) 200 mg twice a day. This vitamin assists nerve cells in the production of ATP a principal energy storing molecule.  It is necessary for many chemical reactions in the body.  There have been at least 3 clinical trials of riboflavin using 400 mg per day all of which suggested that migraine frequency can be decreased.  All 3 trials showed significant improvement in over half of migraine sufferers.  The supplement is found in bread, cereal, milk, meat, and poultry.  Most Americans get more riboflavin than the recommended daily allowance, however riboflavin deficiency is not necessary for the supplements to help prevent headache. Side effects: energizing, green urine  Coenzyme Q10: This is present in almost all cells in the body and is critical component for the conversion of energy.  Recent studies have shown that a nutritional supplement of CoQ10 can reduce the frequency of migraine attacks by improving the energy production of cells as with riboflavin.  Doses of 150 mg twice a day have been shown to be effective.    Melatonin: Increasing evidence shows correlation between melatonin secretion and headache conditions.  Melatonin supplementation has decreased headache intensity and duration.  It is widely used as a sleep aid.  Sleep is natures way of dealing with migraine.  A dose of 3 mg is recommended to start for headaches including cluster headache. Higher doses up to 15 mg has been reviewed for use in Cluster headache and have been used. The rationale behind using melatonin for cluster is that many theories regarding the cause of Cluster headache center around the disruption of the normal circadian rhythm in the brain.  This helps restore the normal circadian rhythm.   HEADACHE DIET: Foods and beverages which may trigger migraine Note that only 20% of headache patients are food sensitive. You will know if you are food sensitive if you get a headache consistently 20 minutes to 2 hours after eating a certain food. Only cut out a food if it causes headaches, otherwise you might remove foods you enjoy! What matters most for diet is to eat a well balanced healthy diet full of vegetables and low fat protein, and to not miss meals.   Chocolate, other sweets ALL cheeses except cottage and cream cheese Dairy products, yogurt, sour cream, ice cream Liver Meat extracts (Bovril, Marmite, meat tenderizers) Meats or fish which have undergone aging, fermenting, pickling or smoking. These include: Hotdogs,salami,Lox,sausage, mortadellas,smoked salmon, pepperoni, Pickled herring Pods of broad bean (English beans, Chinese pea pods, [place] (fava) beans, lima and navy beans Ripe avocado, ripe banana Yeast extracts or active yeast preparations such as Brewer's or Fleishman's (commercial bakes goods are permitted) Tomato based foods, pizza (lasagna, etc.)   MSG (monosodium glutamate) is disguised as many things; look for these common aliases: Monopotassium glutamate Autolysed yeast Hydrolysed protein Sodium  caseinate "flavorings" "all natural preservatives" Nutrasweet   Avoid all other foods that convincingly provoke headaches.   Resources: The Dizzy [place] Laundry Your Headache Diet, migrainestrong.com  https://zamora-andrews.com/   Caffeine and Migraine For patients that have migraine, caffeine intake more than 3 days per week can lead to dependency and increased migraine frequency. I would recommend cutting back on your caffeine intake as best you can. The recommended amount of caffeine is 200-300 mg daily, although migraine patients may experience dependency at even lower doses. While you may notice an increase in headache temporarily, cutting back will be helpful for headaches in the long run. For more information on caffeine and migraine, visit: https://americanmigrainefoundation.org/resource-library/caffeine-and-migraine/   Headache Prevention Strategies:   1. Maintain a headache diary; learn to identify and avoid triggers.  - This can be a simple note where you log when you had a headache, associated symptoms, and medications used - There are several smartphone apps developed to help track migraines: Migraine Buddy, Migraine Monitor, Curelator N1-Headache App   Common triggers include: Emotional triggers: Emotional/Upset family or friends Emotional/Upset occupation Business reversal/success Anticipation anxiety Crisis-serious Post-crisis periodNew job/position   Physical triggers: Vacation Day Weekend Strenuous Exercise High Altitude Location New Move Menstrual Day Physical Illness Oversleep/Not enough sleep Weather changes Light: Photophobia or light sesnitivity treatment involves a balance between desensitization and reduction  in overly strong input. Use dark polarized glasses outside, but not inside. Avoid bright or fluorescent light, but do not dim environment to the point that going into a normally lit room hurts. Consider  FL-41 tint lenses, which reduce the most irritating wavelengths without blocking too much light.  These can be obtained at axonoptics.com or theraspecs.com Foods: see list above.   2. Limit use of acute treatments (over-the-counter medications, triptans, etc.) to no more than 2 days per week or 10 days per month to prevent medication overuse headache (rebound headache).     3. Follow a regular schedule (including weekends and holidays): Don't skip meals. Eat a balanced diet. 8 hours of sleep nightly. Minimize stress. Exercise 30 minutes per day. Being overweight is associated with a 5 times increased risk of chronic migraine. Keep well hydrated and drink 6-8 glasses of water per day.   4. Initiate non-pharmacologic measures at the earliest onset of your headache. Rest and quiet environment. Relax and reduce stress. Breathe2Relax is a free app that can instruct you on    some simple relaxtion and breathing techniques. Http://Dawnbuse.com is a    free website that provides teaching videos on relaxation.  Also, there are  many apps that   can be downloaded for "mindful" relaxation.  An app called YOGA NIDRA will help walk you through mindfulness. Another app called Calm can be downloaded to give you a structured mindfulness guide with daily reminders and skill development. Headspace for guided meditation Mindfulness Based Stress Reduction Online Course: www.palousemindfulness.com Cold compresses.   5. Don't wait!! Take the maximum allowable dosage of prescribed medication at the first sign of migraine.   6. Compliance:  Take prescribed medication regularly as directed and at the first sign of a migraine.   7. Communicate:  Call your physician when problems arise, especially if your headaches change, increase in frequency/severity, or become associated with neurological symptoms (weakness, numbness, slurred speech, etc.). Proceed to emergency room if you experience new or worsening symptoms or  symptoms do not resolve, if you have new neurologic symptoms or if headache is severe, or for any concerning symptom.   8. Headache/pain management therapies: Consider various complementary methods, including medication, behavioral therapy, psychological counselling, biofeedback, massage therapy, acupuncture, dry needling, and other modalities.  Such measures may reduce the need for medications. Counseling for pain management, where patients learn to function and ignore/minimize their pain, seems to work very well.   9. Recommend changing family's attention and focus away from patient's headaches. Instead, emphasize daily activities. If first question of day is 'How are your headaches/Do you have a headache today?', then patient will constantly think about headaches, thus making them worse. Goal is to re-direct attention away from headaches, toward daily activities and other distractions.   10. Helpful Websites: www.AmericanHeadacheSociety.org PatentHood.ch www.headaches.org TightMarket.nl www.achenet.org
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
## 2023-09-29 ENCOUNTER — Ambulatory Visit: Payer: BC Managed Care – PPO

## 2023-09-29 ENCOUNTER — Ambulatory Visit (INDEPENDENT_AMBULATORY_CARE_PROVIDER_SITE_OTHER): Payer: BC Managed Care – PPO | Admitting: Family Medicine

## 2023-09-29 ENCOUNTER — Encounter: Payer: Self-pay | Admitting: Family Medicine

## 2023-09-29 VITALS — BP 126/82 | HR 95 | Ht 64.0 in | Wt 251.0 lb

## 2023-09-29 DIAGNOSIS — M9901 Segmental and somatic dysfunction of cervical region: Secondary | ICD-10-CM | POA: Diagnosis not present

## 2023-09-29 DIAGNOSIS — M51369 Other intervertebral disc degeneration, lumbar region without mention of lumbar back pain or lower extremity pain: Secondary | ICD-10-CM

## 2023-09-29 DIAGNOSIS — M9902 Segmental and somatic dysfunction of thoracic region: Secondary | ICD-10-CM

## 2023-09-29 DIAGNOSIS — M9908 Segmental and somatic dysfunction of rib cage: Secondary | ICD-10-CM | POA: Diagnosis not present

## 2023-09-29 MED ORDER — TIZANIDINE HCL 4 MG PO TABS: 4.0000 mg | ORAL_TABLET | Freq: Every evening | ORAL | 0 refills | Status: DC | PRN

## 2023-09-29 NOTE — Assessment & Plan Note (Signed)
Worsening pain of the low back pain.  Has failed multiple rounds of formal physical therapy, discussed gabapentin again.  Discussed different treatment options including osteopathic manipulation which has not made a significant difference.  Previous x-rays have shown progression of arthritis.  Family history where all of her siblings and mother has had back surgery.  Patient feels like it is affecting all daily activities including her sleep as well as how she accepts a mother.  Patient would like a MRI to further evaluate with her failing everything else at this time.  Depending on how patient does or what is going on imaging we will discuss the possibility of injection or would we need referral to neurosurgery to discuss further.

## 2023-10-01 ENCOUNTER — Ambulatory Visit (INDEPENDENT_AMBULATORY_CARE_PROVIDER_SITE_OTHER): Payer: BC Managed Care – PPO | Admitting: Family Medicine

## 2023-10-01 ENCOUNTER — Encounter: Payer: Self-pay | Admitting: Family Medicine

## 2023-10-01 VITALS — BP 117/80 | HR 84 | Ht 64.0 in | Wt 248.5 lb

## 2023-10-01 DIAGNOSIS — G43709 Chronic migraine without aura, not intractable, without status migrainosus: Secondary | ICD-10-CM

## 2023-10-01 MED ORDER — NURTEC 75 MG PO TBDP: 75.0000 mg | ORAL_TABLET | Freq: Every day | ORAL | 11 refills | Status: DC | PRN

## 2023-10-02 ENCOUNTER — Other Ambulatory Visit (HOSPITAL_COMMUNITY): Payer: Self-pay | Admitting: Pharmacy Technician

## 2023-10-02 ENCOUNTER — Other Ambulatory Visit (HOSPITAL_COMMUNITY): Payer: Self-pay

## 2023-10-02 NOTE — Progress Notes (Signed)
Deleted Duplicate outreach of 28 day & added 84 day outreach

## 2023-10-05 ENCOUNTER — Other Ambulatory Visit: Payer: Self-pay

## 2023-10-05 ENCOUNTER — Other Ambulatory Visit (HOSPITAL_COMMUNITY): Payer: Self-pay

## 2023-10-05 ENCOUNTER — Other Ambulatory Visit: Payer: Self-pay | Admitting: Family Medicine

## 2023-10-05 MED ORDER — BOTOX 200 UNITS IJ SOLR
155.0000 [IU] | INTRAMUSCULAR | 2 refills | Status: DC
  Filled 2023-10-05: qty 1, 90d supply, fill #0
  Filled 2024-01-21: qty 1, 90d supply, fill #1
  Filled 2024-04-25: qty 1, 90d supply, fill #2

## 2023-10-05 NOTE — Telephone Encounter (Signed)
Last seen on 10/01/23 Follow up scheduled on 11/12/23

## 2023-10-05 NOTE — Progress Notes (Signed)
Specialty Pharmacy Refill Coordination Note  Debbie [name] is a 42 y.o. female contacted today regarding refills of specialty medication(s) Onabotulinumtoxina   Patient requested Courier to Provider Office   Delivery date: 11/09/23   Verified address: [facility] [address] [place] [address]   Medication will be filled on 11/06/23.

## 2023-10-06 ENCOUNTER — Ambulatory Visit: Payer: BC Managed Care – PPO | Admitting: Family Medicine

## 2023-10-07 ENCOUNTER — Other Ambulatory Visit: Payer: Self-pay

## 2023-10-07 ENCOUNTER — Ambulatory Visit (INDEPENDENT_AMBULATORY_CARE_PROVIDER_SITE_OTHER): Payer: BC Managed Care – PPO | Admitting: Family Medicine

## 2023-10-07 VITALS — BP 130/84 | HR 95 | Temp 98.1°F | Ht 64.0 in | Wt 249.0 lb

## 2023-10-07 DIAGNOSIS — Z6841 Body Mass Index (BMI) 40.0 and over, adult: Secondary | ICD-10-CM

## 2023-10-07 DIAGNOSIS — R7303 Prediabetes: Secondary | ICD-10-CM | POA: Diagnosis not present

## 2023-10-07 DIAGNOSIS — E785 Hyperlipidemia, unspecified: Secondary | ICD-10-CM | POA: Diagnosis not present

## 2023-10-07 DIAGNOSIS — E669 Obesity, unspecified: Secondary | ICD-10-CM

## 2023-10-07 DIAGNOSIS — E782 Mixed hyperlipidemia: Secondary | ICD-10-CM

## 2023-10-07 NOTE — Assessment & Plan Note (Signed)
Last A1c of 6.1.  She is not on medication due to the risk of porphyria events with lower carb intake or medication administration.  She is getting all protein in and taking in what is a minimum amount of carbs to keep her functional.

## 2023-10-07 NOTE — Progress Notes (Signed)
SUBJECTIVE:  Chief Complaint: Obesity  Interim History: Patient saw Dr. Katrinka Blazing after her last appointment with me.  He ordered a MRI which she will have done in 4 days then he will write a letter in support of her undergoing spinal fusion.  Once she gets the surgery she will then will go to [place] for healing process.  She has been trying to maintain what she has been doing food wise- focusing on getting protein in and vegetables and being as controlled as she can be in terms of carb intake with her porphyria diagnosis.  For Thanksgiving she is anticipating eating at home- her friend will be making the [place].  She is still sometimes forgetting to eat. She an hopeful for surgery in the next 8 weeks.  Keisy is here to discuss her progress with her obesity treatment plan. She is on the Category 3 Plan and states she is following her eating plan approximately 80 % of the time. She states she is not exercising.   OBJECTIVE: Visit Diagnoses: Problem List Items Addressed This Visit       Other   Hyperlipidemia    Mixed hyperlipidemia- low HDL, high triglyceride and high LDL.   The 10-year ASCVD risk score (Arnett DK, et al., 2019) is: 2%   Values used to calculate the score:     Age: 42 years     Sex: Female     Is Non-Hispanic African American: No     Diabetic: No     Tobacco smoker: No     Systolic Blood Pressure: 130 mmHg     Is BP treated: No     HDL Cholesterol: 35 mg/dL     Total Cholesterol: 231 mg/dL     Still low risk so continue to work on lifestyle modifications.      BMI 40.0-44.9, adult (HCC)   Obesity, Beginning BMI 42.7   Prediabetes - Primary    Last A1c of 6.1.  She is not on medication due to the risk of porphyria events with lower carb intake or medication administration.  She is getting all protein in and taking in what is a minimum amount of carbs to keep her functional.       Vitals Temp: 98.1 F (36.7 C) BP: 130/84 Pulse Rate: 95 SpO2: 97  %   Anthropometric Measurements Height: 5\' 4"  (1.626 m) Weight: 249 lb (112.9 kg) BMI (Calculated): 42.72 Weight at Last Visit: 249 lb Weight Lost Since Last Visit: 0 Weight Gained Since Last Visit: 0 Starting Weight: 249 lb Total Weight Loss (lbs): 0 lb (0 kg)   Body Composition  Body Fat %: 47.4 % Fat Mass (lbs): 118 lbs Muscle Mass (lbs): 124.4 lbs Total Body Water (lbs): 91.2 lbs Visceral Fat Rating : 14   Other Clinical Data Today's Visit #: 4 Starting Date: 08/13/23     ASSESSMENT AND PLAN:  Diet: Shenell is currently in the action stage of change. As such, her goal is to continue with weight loss efforts. She has agreed to Category 3 Plan.  Exercise: Aleighna has been instructed that some exercise is better than none for weight loss and overall health benefits.  Patient limited in her ability to do exercise given her significant back pain.   Behavior Modification:  We discussed the following Behavioral Modification Strategies today: increasing lean protein intake, increasing vegetables, meal planning and cooking strategies, and better snacking choices.   No follow-ups on file.Marland Kitchen She was informed of the importance of  frequent follow up visits to maximize her success with intensive lifestyle modifications for her multiple health conditions.  Attestation Statements:   Reviewed by clinician on day of visit: allergies, medications, problem list, medical history, surgical history, family history, social history, and previous encounter notes.   Rudean Hitt, MD

## 2023-10-07 NOTE — Assessment & Plan Note (Signed)
Mixed hyperlipidemia- low HDL, high triglyceride and high LDL.   The 10-year ASCVD risk score (Arnett DK, et al., 2019) is: 2%   Values used to calculate the score:     Age: 42 years     Sex: Female     Is Non-Hispanic African American: No     Diabetic: No     Tobacco smoker: No     Systolic Blood Pressure: 130 mmHg     Is BP treated: No     HDL Cholesterol: 35 mg/dL     Total Cholesterol: 231 mg/dL     Still low risk so continue to work on lifestyle modifications.

## 2023-10-09 ENCOUNTER — Encounter (INDEPENDENT_AMBULATORY_CARE_PROVIDER_SITE_OTHER): Payer: Self-pay | Admitting: Family Medicine

## 2023-10-11 ENCOUNTER — Ambulatory Visit
Admission: RE | Admit: 2023-10-11 | Discharge: 2023-10-11 | Disposition: A | Discharge status: Home / Self Care | Payer: BC Managed Care – PPO | Source: Ambulatory Visit | Attending: Family Medicine | Admitting: Family Medicine

## 2023-10-11 DIAGNOSIS — M51369 Other intervertebral disc degeneration, lumbar region without mention of lumbar back pain or lower extremity pain: Secondary | ICD-10-CM

## 2023-10-12 ENCOUNTER — Telehealth: Payer: Self-pay | Admitting: *Deleted

## 2023-10-12 ENCOUNTER — Telehealth: Payer: Self-pay

## 2023-10-12 ENCOUNTER — Other Ambulatory Visit: Payer: Self-pay | Admitting: Family Medicine

## 2023-10-12 ENCOUNTER — Other Ambulatory Visit (HOSPITAL_COMMUNITY): Payer: Self-pay

## 2023-10-12 ENCOUNTER — Encounter: Payer: Self-pay | Admitting: Family Medicine

## 2023-10-12 DIAGNOSIS — M51362 Other intervertebral disc degeneration, lumbar region with discogenic back pain and lower extremity pain: Secondary | ICD-10-CM

## 2023-10-12 NOTE — Telephone Encounter (Signed)
I only see one triptan tried also

## 2023-10-12 NOTE — Telephone Encounter (Signed)
Pt sent mychart message that PA Nurtec needed.

## 2023-10-12 NOTE — Telephone Encounter (Signed)
   I could only find proof of one Triptan tried in the chart-please advise

## 2023-10-13 NOTE — Telephone Encounter (Signed)
Does provider wish for me to submit with only having tried one Triptan and risk denial or would provider like to try another Triptan first unless PT has contraindication to Triptan-please advise.

## 2023-10-14 ENCOUNTER — Other Ambulatory Visit (HOSPITAL_COMMUNITY): Payer: Self-pay

## 2023-10-14 ENCOUNTER — Telehealth: Payer: Self-pay | Admitting: Family Medicine

## 2023-10-14 NOTE — Telephone Encounter (Signed)
Received fax of approval, auth#: [id number] (10/14/23-10/13/24). Pt will continue to fill through [facility].

## 2023-10-14 NOTE — Telephone Encounter (Signed)
Pharmacy Patient Advocate Encounter   Received notification from Physician's Office that prior authorization for Nurtec 75MG  dispersible tablets is required/requested.   Insurance verification completed.   The patient is insured through [facility] .   Per test claim: PA required; PA submitted to above mentioned insurance via CoverMyMeds Key/confirmation #/EOC [id number] Status is pending

## 2023-10-15 NOTE — Telephone Encounter (Signed)
PA request has been Submitted. New Encounter created for follow up. For additional info see Pharmacy Prior Auth telephone encounter from 10/12/2023.

## 2023-10-16 NOTE — Telephone Encounter (Signed)
Pharmacy Patient Advocate Encounter  Received notification from [facility] that Prior Authorization for Nurtec 75MG  dispersible tablets has been DENIED.  Full denial letter will be uploaded to the media tab. See denial reason below.   PA #/Case ID/Reference #: PA Case ID #: [id number]

## 2023-10-19 ENCOUNTER — Other Ambulatory Visit: Payer: Self-pay | Admitting: Neurology

## 2023-10-19 MED ORDER — ZOLMITRIPTAN 5 MG PO TABS: 5.0000 mg | ORAL_TABLET | ORAL | 0 refills | Status: DC | PRN

## 2023-10-19 NOTE — Telephone Encounter (Signed)
Thank you Debbie [name], I called in [place]. Please let her know. If this doesn;t work we can try for nurtec again.

## 2023-10-19 NOTE — Telephone Encounter (Signed)
Dr. Lucia Gaskins- what other triptan would you recommend she try?

## 2023-10-19 NOTE — Telephone Encounter (Signed)
See 10/12/23 telephone note. Sent pt mychart message today.

## 2023-10-21 NOTE — Discharge Instructions (Signed)

## 2023-10-26 ENCOUNTER — Ambulatory Visit
Admission: RE | Admit: 2023-10-26 | Discharge: 2023-10-26 | Disposition: A | Discharge status: Home / Self Care | Payer: BC Managed Care – PPO | Source: Ambulatory Visit | Attending: Family Medicine | Admitting: Family Medicine

## 2023-10-26 ENCOUNTER — Other Ambulatory Visit: Payer: Self-pay | Admitting: Family Medicine

## 2023-10-26 DIAGNOSIS — M51362 Other intervertebral disc degeneration, lumbar region with discogenic back pain and lower extremity pain: Secondary | ICD-10-CM

## 2023-10-26 NOTE — Telephone Encounter (Signed)
Changed order to medial branch block. Advised [facility] and they know not to use steroid.

## 2023-10-28 ENCOUNTER — Other Ambulatory Visit: Payer: Self-pay

## 2023-10-28 ENCOUNTER — Telehealth: Payer: Self-pay | Admitting: Family Medicine

## 2023-10-28 DIAGNOSIS — M51369 Other intervertebral disc degeneration, lumbar region without mention of lumbar back pain or lower extremity pain: Secondary | ICD-10-CM

## 2023-10-28 NOTE — Telephone Encounter (Signed)
Spoke with patient about recommendation

## 2023-10-28 NOTE — Telephone Encounter (Signed)
Patient called stating that she would like to talk to Dr Katrinka Blazing about the potential of proceeding with surgery. She said that she will be changing insurance companies so she was wanting to try to have the surgery before the end of the year.  Would you be able to call her to discuss? (First available appointment with Dr Katrinka Blazing is 1/23)

## 2023-11-05 NOTE — Discharge Instructions (Signed)
Medial Branch Block Discharge Instructions  Take over-the-counter and prescription medicines only as told by your health care provider.  Do not drive the day of your procedure  Return to your normal activities as told by your health care provider.   If injection site is sore you may ice the area for 20 minutes, 2-3 times a day.   Check your injection site every day for signs of infection. Check for: Redness, swelling, or pain. Fluid or blood. Warmth. Pus or a bad smell.  Please contact our office at [phone] if: You have a fever or chills. You have any signs of infection. You develop any numbness or weakness.   Thank you for visiting our office.  

## 2023-11-06 ENCOUNTER — Other Ambulatory Visit: Payer: Self-pay | Admitting: Family Medicine

## 2023-11-06 ENCOUNTER — Ambulatory Visit
Admission: RE | Admit: 2023-11-06 | Discharge: 2023-11-06 | Disposition: A | Discharge status: Home / Self Care | Payer: BC Managed Care – PPO | Source: Ambulatory Visit | Attending: Family Medicine | Admitting: Family Medicine

## 2023-11-06 DIAGNOSIS — M51362 Other intervertebral disc degeneration, lumbar region with discogenic back pain and lower extremity pain: Secondary | ICD-10-CM

## 2023-11-10 ENCOUNTER — Encounter (INDEPENDENT_AMBULATORY_CARE_PROVIDER_SITE_OTHER): Payer: Self-pay | Admitting: Family Medicine

## 2023-11-10 ENCOUNTER — Ambulatory Visit (INDEPENDENT_AMBULATORY_CARE_PROVIDER_SITE_OTHER): Payer: BC Managed Care – PPO | Admitting: Family Medicine

## 2023-11-10 VITALS — BP 132/82 | HR 94 | Temp 99.0°F | Ht 64.0 in | Wt 249.0 lb

## 2023-11-10 DIAGNOSIS — R739 Hyperglycemia, unspecified: Secondary | ICD-10-CM | POA: Insufficient documentation

## 2023-11-10 DIAGNOSIS — E669 Obesity, unspecified: Secondary | ICD-10-CM | POA: Diagnosis not present

## 2023-11-10 DIAGNOSIS — Z6841 Body Mass Index (BMI) 40.0 and over, adult: Secondary | ICD-10-CM

## 2023-11-10 DIAGNOSIS — E782 Mixed hyperlipidemia: Secondary | ICD-10-CM

## 2023-11-10 MED ORDER — OZEMPIC (0.25 OR 0.5 MG/DOSE) 2 MG/3ML ~~LOC~~ SOPN: 0.2500 mg | PEN_INJECTOR | SUBCUTANEOUS | 0 refills | Status: DC

## 2023-11-10 NOTE — Assessment & Plan Note (Signed)
Patients blood sugars are elevated previously. Last A1c of 6.1.  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  She is open to seeing if insurance will cover her for GLP1.  Ozempic Rx sent in to see if insurance will cover.

## 2023-11-10 NOTE — Assessment & Plan Note (Signed)
Mixed hyperlipidemia of low HDL, elevated triglyceride and elevated LDL.  No statin at this time.  The 10-year ASCVD risk score (Arnett DK, et al., 2019) is: 2%   Values used to calculate the score:     Age: 42 years     Sex: Female     Is Non-Hispanic African American: No     Diabetic: No     Tobacco smoker: No     Systolic Blood Pressure: 132 mmHg     Is BP treated: No     HDL Cholesterol: 35 mg/dL     Total Cholesterol: 231 mg/dL Medication not needed at this time.

## 2023-11-10 NOTE — Progress Notes (Signed)
SUBJECTIVE:  Chief Complaint: Obesity  Interim History: Patient had a good Thanksgiving.  She fell down the stairs last weekend.  She went and had a nerve block but that didn't work.  She is planning for surgery next year but not sure if her insurance will cover it. Has a surgical consult on the 23rd. If she gets surgery she will stay local but if she doesn't get surgery she will go visit family in [place].  She is very limited in the amount of activity that is tolerable for her with her back pain.  She cannot do much except walk a mile and that often keeps her from any activity for a while.   Debbie [name] is here to discuss her progress with her obesity treatment plan. She is on the Category 3 Plan and states she is following her eating plan approximately 90 % of the time. She states she is not exercising.  OBJECTIVE: Visit Diagnoses: Problem List Items Addressed This Visit       Other   Hyperlipidemia   Mixed hyperlipidemia of low HDL, elevated triglyceride and elevated LDL.  No statin at this time.  The 10-year ASCVD risk score (Arnett DK, et al., 2019) is: 2%   Values used to calculate the score:     Age: 42 years     Sex: Female     Is Non-Hispanic African American: No     Diabetic: No     Tobacco smoker: No     Systolic Blood Pressure: 132 mmHg     Is BP treated: No     HDL Cholesterol: 35 mg/dL     Total Cholesterol: 231 mg/dL Medication not needed at this time.      Hyperglycemia - Primary   Patients blood sugars are elevated previously. Last A1c of 6.1.  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  She is open to seeing if insurance will cover her for GLP1.  Ozempic Rx sent in to see if insurance will cover.       Relevant Medications   Semaglutide,0.25 or 0.5MG /DOS, (OZEMPIC, 0.25 OR 0.5 MG/DOSE,) 2 MG/3ML SOPN    Vitals Temp: 99 F (37.2 C) BP: 132/82 Pulse Rate: 94 SpO2: 98 %   Anthropometric Measurements Height: 5\' 4"   (1.626 m) Weight: 249 lb (112.9 kg) BMI (Calculated): 42.72 Weight at Last Visit: 249 lb Weight Lost Since Last Visit: 0 Weight Gained Since Last Visit: 0 Starting Weight: 249 lb Total Weight Loss (lbs): 0 lb (0 kg)   Body Composition  Body Fat %: 47.6 % Fat Mass (lbs): 119 lbs Muscle Mass (lbs): 124.2 lbs Total Body Water (lbs): 91.4 lbs Visceral Fat Rating : 14   Other Clinical Data Today's Visit #: 5 Starting Date: 08/13/23     ASSESSMENT AND PLAN:  Diet: Debbie [name] is currently in the action stage of change. As such, her goal is to continue with weight loss efforts. She has agreed to Category 3 meal plan.  She is focusing on increasing protein and being in control of total carb intake.   Exercise: Debbie [name] has been instructed that some exercise is better than none for weight loss and overall health benefits.   Behavior Modification:  We discussed the following Behavioral Modification Strategies today: increasing lean protein intake, increasing vegetables, meal planning and cooking strategies, travel eating strategies, holiday eating strategies, and planning for success.  No follow-ups on file.Marland Kitchen She was informed of the importance of  frequent follow up visits to maximize her success with intensive lifestyle modifications for her multiple health conditions.  Attestation Statements:   Reviewed by clinician on day of visit: allergies, medications, problem list, medical history, surgical history, family history, social history, and previous encounter notes.      Reuben Likes, MD

## 2023-11-11 ENCOUNTER — Other Ambulatory Visit: Payer: Self-pay

## 2023-11-11 ENCOUNTER — Encounter (INDEPENDENT_AMBULATORY_CARE_PROVIDER_SITE_OTHER): Payer: Self-pay

## 2023-11-11 ENCOUNTER — Telehealth (INDEPENDENT_AMBULATORY_CARE_PROVIDER_SITE_OTHER): Payer: Self-pay

## 2023-11-11 MED ORDER — TIZANIDINE HCL 4 MG PO TABS: 4.0000 mg | ORAL_TABLET | Freq: Every evening | ORAL | 0 refills | Status: DC | PRN

## 2023-11-11 MED ORDER — TIZANIDINE HCL 4 MG PO TABS: 4.0000 mg | ORAL_TABLET | Freq: Two times a day (BID) | ORAL | 0 refills | Status: DC | PRN

## 2023-11-11 NOTE — Telephone Encounter (Signed)
Prior auth denied   Your plan only covers this drug when it is used for certain health conditions. Covered use is for type 2 diabetes.

## 2023-11-11 NOTE — Telephone Encounter (Signed)
Prior auth started Key: [id number]

## 2023-11-12 ENCOUNTER — Ambulatory Visit (INDEPENDENT_AMBULATORY_CARE_PROVIDER_SITE_OTHER): Payer: BC Managed Care – PPO | Admitting: Family Medicine

## 2023-11-12 DIAGNOSIS — G43709 Chronic migraine without aura, not intractable, without status migrainosus: Secondary | ICD-10-CM | POA: Diagnosis not present

## 2023-11-12 MED ORDER — ONABOTULINUMTOXINA 200 UNITS IJ SOLR
155.0000 [IU] | Freq: Once | INTRAMUSCULAR | Status: AC
  Administered 2023-11-12: 155 [IU] via INTRAMUSCULAR

## 2023-11-12 NOTE — Progress Notes (Signed)
Botox- 200 units x 1 vial Lot: [id number] Expiration: 01/2026 NDC: 1478-2956-21  Bacteriostatic 0.9% Sodium Chloride- * mL  Lot: [id number] Expiration: 09/24/2024 NDC: 8469-6295-28  Dx: U13.244 S/P  Witnessed by Berneice Gandy, RMA
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
## 2023-11-16 ENCOUNTER — Other Ambulatory Visit (INDEPENDENT_AMBULATORY_CARE_PROVIDER_SITE_OTHER): Payer: Self-pay | Admitting: Family Medicine

## 2023-11-16 ENCOUNTER — Other Ambulatory Visit: Payer: Self-pay

## 2023-11-16 DIAGNOSIS — R739 Hyperglycemia, unspecified: Secondary | ICD-10-CM

## 2023-11-16 DIAGNOSIS — M51362 Other intervertebral disc degeneration, lumbar region with discogenic back pain and lower extremity pain: Secondary | ICD-10-CM

## 2023-11-22 ENCOUNTER — Other Ambulatory Visit: Payer: Self-pay | Admitting: Neurology

## 2023-12-21 ENCOUNTER — Encounter (INDEPENDENT_AMBULATORY_CARE_PROVIDER_SITE_OTHER): Payer: Self-pay | Admitting: Family Medicine

## 2023-12-21 ENCOUNTER — Ambulatory Visit (INDEPENDENT_AMBULATORY_CARE_PROVIDER_SITE_OTHER): Payer: BC Managed Care – PPO | Admitting: Family Medicine

## 2023-12-21 VITALS — BP 122/87 | HR 97 | Temp 98.3°F | Ht 64.0 in | Wt 246.0 lb

## 2023-12-21 DIAGNOSIS — E785 Hyperlipidemia, unspecified: Secondary | ICD-10-CM

## 2023-12-21 DIAGNOSIS — E669 Obesity, unspecified: Secondary | ICD-10-CM | POA: Diagnosis not present

## 2023-12-21 DIAGNOSIS — R7303 Prediabetes: Secondary | ICD-10-CM | POA: Diagnosis not present

## 2023-12-21 DIAGNOSIS — Z6841 Body Mass Index (BMI) 40.0 and over, adult: Secondary | ICD-10-CM | POA: Diagnosis not present

## 2023-12-21 DIAGNOSIS — E782 Mixed hyperlipidemia: Secondary | ICD-10-CM

## 2023-12-21 MED ORDER — BD PEN NEEDLE NANO 2ND GEN 32G X 4 MM MISC: 1.0000 | Freq: Two times a day (BID) | 0 refills | Status: AC

## 2023-12-21 MED ORDER — SEMAGLUTIDE (1 MG/DOSE) 4 MG/3ML ~~LOC~~ SOPN: 1.0000 mg | PEN_INJECTOR | SUBCUTANEOUS | 0 refills | Status: DC

## 2023-12-21 NOTE — Progress Notes (Deleted)
   SUBJECTIVE:  Chief Complaint: Obesity  Interim History: ***  Debbie [name] is here to discuss her progress with her obesity treatment plan. She is on the Category 3 Plan and states she is following her eating plan approximately 80 % of the time. She states she is walking for 30 minutes 3 times per week.   OBJECTIVE: Visit Diagnoses: Problem List Items Addressed This Visit   None   No data recorded Anthropometric Measurements Height: 5\' 4"  (1.626 m) Weight at Last Visit: 249 lb Starting Weight: 249 lb   No data recorded Other Clinical Data Today's Visit #: 6 Starting Date: 08/13/23     ASSESSMENT AND PLAN:  Diet: Debbie [name] {CHL AMB IS/IS MVH:[id number]} currently in the action stage of change. As such, her goal is to {HWW Weight Loss Efforts:210964006}. She {HAS HAS WUX:32440} agreed to {HWW Weight Loss Plan:210964005}.  Exercise: Debbie [name] has been instructed {HWW Exercise:210964007} for weight loss and overall health benefits.   Behavior Modification:  We discussed the following Behavioral Modification Strategies today: {HWW Behavior Modification:210964008}. We discussed various medication options to help Debbie [name] with her weight loss efforts and we both agreed to ***.  No follow-ups on file.Marland Kitchen She was informed of the importance of frequent follow up visits to maximize her success with intensive lifestyle modifications for her multiple health conditions.  Attestation Statements:   Reviewed by clinician on day of visit: allergies, medications, problem list, medical history, surgical history, family history, social history, and previous encounter notes.   Time spent on visit including pre-visit chart review and post-visit care and charting was *** minutes.    Reuben Likes, MD

## 2023-12-21 NOTE — Progress Notes (Unsigned)
   SUBJECTIVE:  Chief Complaint: Obesity  Interim History: Patient's insurance denied Ozempic and so she paid out of pocket for the medication.  She mentions this is not sustainable long term.  She feels fuller faster and is having to snack throughout the day to ensure she is getting all the food in.  She did meal prep yesterday and is ready for week now. She does feel like she could continue to titrate up her medication.  She mentions they are house hunting currently    Debbie [name] is here to discuss her progress with her obesity treatment plan. She is on the Category 3 Plan and states she is following her eating plan approximately 80 % of the time. She states she is not exercising.   OBJECTIVE: Visit Diagnoses: Problem List Items Addressed This Visit       Other   Hyperlipidemia   Patient had labs done in the last 2 months with PCP.  Gemfibrozil not effective in controlling her triglycerides. She is needing to take in a significant amount of carbohydrates to ensure she does not have a porphyria attack.      BMI 40.0-44.9, adult (HCC)   Relevant Medications   Semaglutide, 1 MG/DOSE, 4 MG/3ML SOPN   Obesity, Beginning BMI 42.7   Starting weight of 249 on August 13, 2023.  Current weight of 246.  Patient has only had weight loss since starting Ozempic.  She has been as consistent as she can be on the structured meal plan given her history of porphyria and needing consistent carbohydrate intake.      Relevant Medications   Semaglutide, 1 MG/DOSE, 4 MG/3ML SOPN   Prediabetes - Primary   In September of 6.1 with an elevated insulin level of 40.5 this is increased from prior A1c's.  Patient reports she has a history of porphyria and needs consistent carbohydrate intake to prevent attacks.  Will prescribe semaglutide to see if insurance will cover under diagnosis of prediabetes.      Relevant Medications   Insulin Pen Needle (BD PEN NEEDLE NANO 2ND GEN) 32G X 4 MM MISC   Semaglutide, 1  MG/DOSE, 4 MG/3ML SOPN    No data recorded  No data recorded  No data recorded  No data recorded    ASSESSMENT AND PLAN:  Diet: Jerline is currently in the action stage of change. As such, her goal is to continue with weight loss efforts. She has agreed to Category 3 Plan.  Exercise: Takisha has been instructed that some exercise is better than none for weight loss and overall health benefits.   Behavior Modification:  We discussed the following Behavioral Modification Strategies today: increasing lean protein intake, increasing vegetables, meal planning and cooking strategies, and better snacking choices.   Return in about 4 weeks (around 01/18/2024).Marland Kitchen She was informed of the importance of frequent follow up visits to maximize her success with intensive lifestyle modifications for her multiple health conditions.  Attestation Statements:   Reviewed by clinician on day of visit: allergies, medications, problem list, medical history, surgical history, family history, social history, and previous encounter notes.      Reuben Likes, MD

## 2023-12-21 NOTE — Assessment & Plan Note (Addendum)
Patient had labs done in the last 2 months with PCP.  Gemfibrozil not effective in controlling her triglycerides. She is needing to take in a significant amount of carbohydrates to ensure she does not have a porphyria attack.

## 2023-12-22 ENCOUNTER — Other Ambulatory Visit: Payer: Self-pay | Admitting: Family Medicine

## 2023-12-27 NOTE — Assessment & Plan Note (Signed)
In September of 6.1 with an elevated insulin level of 40.5 this is increased from prior A1c's.  Patient reports she has a history of porphyria and needs consistent carbohydrate intake to prevent attacks.  Will prescribe semaglutide to see if insurance will cover under diagnosis of prediabetes.

## 2023-12-27 NOTE — Assessment & Plan Note (Signed)
Starting weight of 249 on August 13, 2023.  Current weight of 246.  Patient has only had weight loss since starting Ozempic.  She has been as consistent as she can be on the structured meal plan given her history of porphyria and needing consistent carbohydrate intake.

## 2023-12-29 ENCOUNTER — Telehealth (INDEPENDENT_AMBULATORY_CARE_PROVIDER_SITE_OTHER): Payer: Self-pay

## 2023-12-29 NOTE — Telephone Encounter (Signed)
Your PA request cannot be processed. The corresponding PA request is closed.

## 2024-01-01 ENCOUNTER — Encounter: Payer: Self-pay | Admitting: Family Medicine

## 2024-01-11 NOTE — Telephone Encounter (Signed)
 Called [facility] to see if auth carried over to new plan or if new Berkley Harvey is needed, spoke with Selena Batten. She states auth carried over, she ran a test claim and received paid claim in return. Pt will continue to fill through [facility].

## 2024-01-18 ENCOUNTER — Other Ambulatory Visit (INDEPENDENT_AMBULATORY_CARE_PROVIDER_SITE_OTHER): Payer: Self-pay | Admitting: Family Medicine

## 2024-01-18 DIAGNOSIS — R7303 Prediabetes: Secondary | ICD-10-CM

## 2024-01-19 ENCOUNTER — Ambulatory Visit (INDEPENDENT_AMBULATORY_CARE_PROVIDER_SITE_OTHER): Payer: 59 | Admitting: Family Medicine

## 2024-01-19 ENCOUNTER — Encounter (INDEPENDENT_AMBULATORY_CARE_PROVIDER_SITE_OTHER): Payer: Self-pay | Admitting: Family Medicine

## 2024-01-19 VITALS — BP 114/80 | HR 94 | Temp 97.5°F | Ht 64.0 in | Wt 241.0 lb

## 2024-01-19 DIAGNOSIS — Z6841 Body Mass Index (BMI) 40.0 and over, adult: Secondary | ICD-10-CM

## 2024-01-19 DIAGNOSIS — R7303 Prediabetes: Secondary | ICD-10-CM | POA: Diagnosis not present

## 2024-01-19 DIAGNOSIS — E669 Obesity, unspecified: Secondary | ICD-10-CM | POA: Diagnosis not present

## 2024-01-19 DIAGNOSIS — E785 Hyperlipidemia, unspecified: Secondary | ICD-10-CM

## 2024-01-19 DIAGNOSIS — E782 Mixed hyperlipidemia: Secondary | ICD-10-CM

## 2024-01-19 NOTE — Assessment & Plan Note (Signed)
 Patients FLP elevated for Triglycerides and LDL and then HDL below goal.  She will need repeat labs in the next month or two.

## 2024-01-19 NOTE — Assessment & Plan Note (Signed)
 Patient is doing well on Ozempic except for nausea. She is doing 0.25mg  which is around 18 clicks of 1mg  pen.  She is feeling like she has more energy on the Ozempic.  Continue current med at same dose.

## 2024-01-19 NOTE — Progress Notes (Unsigned)
   SUBJECTIVE:  Chief Complaint: Obesity  Interim History: patient has been packing in preparation for her move next week.  She is dealing with a significant pain in her back and hip area.  She mentions her back hurts but its manageable.  She is experiencing significant satiety on her medication and sometimes she experiences emesis if she forces herself to eat.  She can tell she is losing inches.   Raylen is here to discuss her progress with her obesity treatment plan. She is on the Category 3 Plan and states she is following her eating plan approximately 85 % of the time. She states she is walking and stretching 30-45 minutes 3-4 times per week.   OBJECTIVE: Visit Diagnoses: Problem List Items Addressed This Visit   None   Vitals Temp: (!) 97.5 F (36.4 C) BP: 114/80 Pulse Rate: 94 SpO2: 96 %   Anthropometric Measurements Height: 5\' 4"  (1.626 m) Weight: 241 lb (109.3 kg) BMI (Calculated): 41.35 Weight at Last Visit: 246 lb Weight Lost Since Last Visit: 5 Weight Gained Since Last Visit: 0 Starting Weight: 249 lb Total Weight Loss (lbs): 8 lb (3.629 kg)   Body Composition  Body Fat %: 45.7 % Fat Mass (lbs): 110.6 lbs Muscle Mass (lbs): 124.6 lbs Total Body Water (lbs): 85.8 lbs Visceral Fat Rating : 13   Other Clinical Data Today's Visit #: 7 Starting Date: 08/13/23 Comments: Cat 3     ASSESSMENT AND PLAN:  Diet: Renea is currently in the action stage of change. As such, her goal is to continue with weight loss efforts and has agreed to {MWMwtlossportion/plan2:23431}.   Exercise:  All adults should avoid inactivity. Some activity is better than none, and adults who participate in any amount of physical activity, gain some health benefits.  Behavior Modification:  We discussed the following Behavioral Modification Strategies today: increasing lean protein intake, increase H2O intake, no skipping meals, and meal planning and cooking strategies.   No  follow-ups on file.Marland Kitchen She was informed of the importance of frequent follow up visits to maximize her success with intensive lifestyle modifications for her multiple health conditions.  Attestation Statements:   Reviewed by clinician on day of visit: allergies, medications, problem list, medical history, surgical history, family history, social history, and previous encounter notes.     Reuben Likes, MD

## 2024-01-21 ENCOUNTER — Other Ambulatory Visit: Payer: Self-pay

## 2024-01-21 NOTE — Progress Notes (Signed)
 Specialty Pharmacy Refill Coordination Note  Debbie [name] is a 43 y.o. female contacted today regarding refills of specialty medication(s) OnabotulinumtoxinA (Botox)   Patient requested Courier to Provider Office   Delivery date: 02/04/24   Verified address: [facility] [address] [place] [address]   Medication will be filled on 02/03/24.

## 2024-01-22 ENCOUNTER — Telehealth: Payer: Self-pay | Admitting: Pharmacist

## 2024-01-22 NOTE — Telephone Encounter (Signed)
 Pharmacy Patient Advocate Encounter  Received notification from [facility] that Prior Authorization for Nurtec ODT has been APPROVED from 01/22/2024 to 01/21/2025

## 2024-01-22 NOTE — Telephone Encounter (Signed)
 Pharmacy Patient Advocate Encounter   Received notification from Patient Pharmacy that prior authorization for Nurtec 75MG  dispersible tablets is required/requested.   Insurance verification completed.   The patient is insured through [facility] .   Per test claim: PA required; PA submitted to above mentioned insurance via CoverMyMeds Key/confirmation #/EOC  [facility] Status is pending

## 2024-02-02 ENCOUNTER — Encounter (HOSPITAL_COMMUNITY): Payer: Self-pay | Admitting: Emergency Medicine

## 2024-02-02 ENCOUNTER — Emergency Department (HOSPITAL_COMMUNITY)

## 2024-02-02 ENCOUNTER — Other Ambulatory Visit: Payer: Self-pay

## 2024-02-02 ENCOUNTER — Emergency Department (HOSPITAL_COMMUNITY)
Admission: EM | Admit: 2024-02-02 | Discharge: 2024-02-03 | Discharge status: Against Medical Advice | Attending: Emergency Medicine | Admitting: Emergency Medicine

## 2024-02-02 DIAGNOSIS — Z5321 Procedure and treatment not carried out due to patient leaving prior to being seen by health care provider: Secondary | ICD-10-CM | POA: Diagnosis not present

## 2024-02-02 DIAGNOSIS — R0602 Shortness of breath: Secondary | ICD-10-CM | POA: Insufficient documentation

## 2024-02-02 DIAGNOSIS — R079 Chest pain, unspecified: Secondary | ICD-10-CM | POA: Insufficient documentation

## 2024-02-02 DIAGNOSIS — R059 Cough, unspecified: Secondary | ICD-10-CM | POA: Insufficient documentation

## 2024-02-02 DIAGNOSIS — R42 Dizziness and giddiness: Secondary | ICD-10-CM | POA: Diagnosis not present

## 2024-02-02 LAB — RESP PANEL BY RT-PCR (RSV, FLU A&B, COVID)  RVPGX2
Influenza A by PCR: NEGATIVE
Influenza B by PCR: NEGATIVE
Resp Syncytial Virus by PCR: NEGATIVE
SARS Coronavirus 2 by RT PCR: NEGATIVE

## 2024-02-02 LAB — CBC
HCT: 45.3 % (ref 36.0–46.0)
Hemoglobin: 15.3 g/dL — ABNORMAL HIGH (ref 12.0–15.0)
MCH: 28.2 pg (ref 26.0–34.0)
MCHC: 33.8 g/dL (ref 30.0–36.0)
MCV: 83.6 fL (ref 80.0–100.0)
Platelets: 276 10*3/uL (ref 150–400)
RBC: 5.42 MIL/uL — ABNORMAL HIGH (ref 3.87–5.11)
RDW: 13 % (ref 11.5–15.5)
WBC: 7.2 10*3/uL (ref 4.0–10.5)
nRBC: 0 % (ref 0.0–0.2)

## 2024-02-02 LAB — COMPREHENSIVE METABOLIC PANEL
ALT: 42 U/L (ref 0–44)
AST: 36 U/L (ref 15–41)
Albumin: 3.8 g/dL (ref 3.5–5.0)
Alkaline Phosphatase: 78 U/L (ref 38–126)
Anion gap: 12 (ref 5–15)
BUN: 7 mg/dL (ref 6–20)
CO2: 21 mmol/L — ABNORMAL LOW (ref 22–32)
Calcium: 9.1 mg/dL (ref 8.9–10.3)
Chloride: 104 mmol/L (ref 98–111)
Creatinine, Ser: 1.16 mg/dL — ABNORMAL HIGH (ref 0.44–1.00)
GFR, Estimated: >60 mL/min (ref >60)
Glucose, Bld: 110 mg/dL — ABNORMAL HIGH (ref 70–99)
Potassium: 3.8 mmol/L (ref 3.5–5.1)
Sodium: 137 mmol/L (ref 135–145)
Total Bilirubin: 0.6 mg/dL (ref 0.0–1.2)
Total Protein: 7.1 g/dL (ref 6.5–8.1)

## 2024-02-02 LAB — TROPONIN I (HIGH SENSITIVITY): Troponin I (High Sensitivity): <2 ng/L (ref <18)

## 2024-02-02 NOTE — ED Triage Notes (Addendum)
 Patient complaining of generalized chest pain, shortness of breath and dizziness that stated today. Reports cough x2 days and back pain that she believes is from coughing. Subjective fevers at home.

## 2024-02-02 NOTE — ED Provider Triage Note (Signed)
 Emergency Medicine Provider Triage Evaluation Note  Debbie [name] , a 43 y.o. female  was evaluated in triage.  Pt complains of cough, SOB, chest pain.  Review of Systems  Positive:  Negative:   Physical Exam  BP 132/86 (BP Location: Right Arm)   Pulse 94   Temp 98.2 F (36.8 C)   Resp 18   Ht 5\' 4"  (1.626 m)   Wt 108.4 kg   LMP 08/20/2022 (Exact Date) Comment: currently menstruating  SpO2 99%   BMI 41.02 kg/m  Gen:   Awake, no distress   Resp:  Normal effort  MSK:   Moves extremities without difficulty  Other:    Medical Decision Making  Medically screening exam initiated at 8:09 PM.  Appropriate orders placed.  Debbie [name] was informed that the remainder of the evaluation will be completed by another provider, this initial triage assessment does not replace that evaluation, and the importance of remaining in the ED until their evaluation is complete.  Cough x2 days. SOB and chest pain and dizziness started today. Patient not in respiratory distress or diaphoretic. Also endorses an episode of diarrhea 2 days ago.    Dorthy Cooler, [place] 02/02/24 2010

## 2024-02-02 NOTE — Assessment & Plan Note (Signed)
 Anthropometric Measurements Height: 5\' 4"  (1.626 m) Weight: 241 lb (109.3 kg) BMI (Calculated): 41.35 Weight at Last Visit: 246 lb Weight Lost Since Last Visit: 5 Weight Gained Since Last Visit: 0 Starting Weight: 249 lb Total Weight Loss (lbs): 8 lb (3.629 kg)  Body Composition  Body Fat %: 45.7 % Fat Mass (lbs): 110.6 lbs Muscle Mass (lbs): 124.6 lbs Total Body Water (lbs): 85.8 lbs Visceral Fat Rating : 13  Other Clinical Data Today's Visit #: 7 Starting Date: 08/13/23 Comments: Cat 3

## 2024-02-03 NOTE — ED Notes (Signed)
 Pt left d/t wait time
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
## 2024-02-08 ENCOUNTER — Ambulatory Visit: Payer: BC Managed Care – PPO | Admitting: Family Medicine

## 2024-02-08 DIAGNOSIS — G43709 Chronic migraine without aura, not intractable, without status migrainosus: Secondary | ICD-10-CM | POA: Diagnosis not present

## 2024-02-08 MED ORDER — ONABOTULINUMTOXINA 200 UNITS IJ SOLR
155.0000 [IU] | Freq: Once | INTRAMUSCULAR | Status: AC
  Administered 2024-02-08: 155 [IU] via INTRAMUSCULAR

## 2024-02-08 NOTE — Progress Notes (Signed)
 Botox- 200 units x 1 vial Lot: [id number] Expiration: 2027/04 NDC: 0023-3921-02  Bacteriostatic 0.9% Sodium Chloride- 4 mL  Lot: [id number] Expiration: 09/24/24 NDC: 6962952841  Dx: Chronic migraine without aura without status migrainosus, not intractable . G43.709    S/P  Witnessed by Raynald Kemp CMA

## 2024-02-09 ENCOUNTER — Emergency Department

## 2024-02-09 ENCOUNTER — Other Ambulatory Visit: Payer: Self-pay

## 2024-02-09 ENCOUNTER — Encounter: Payer: Self-pay | Admitting: Emergency Medicine

## 2024-02-09 ENCOUNTER — Ambulatory Visit: Payer: Self-pay | Admitting: Sports Medicine

## 2024-02-09 ENCOUNTER — Emergency Department
Admission: EM | Admit: 2024-02-09 | Discharge: 2024-02-09 | Disposition: A | Discharge status: Home / Self Care | Attending: Student in an Organized Health Care Education/Training Program | Admitting: Student in an Organized Health Care Education/Training Program

## 2024-02-09 DIAGNOSIS — R0789 Other chest pain: Secondary | ICD-10-CM | POA: Insufficient documentation

## 2024-02-09 DIAGNOSIS — R112 Nausea with vomiting, unspecified: Secondary | ICD-10-CM | POA: Diagnosis not present

## 2024-02-09 LAB — CBC
HCT: 44.3 % (ref 36.0–46.0)
Hemoglobin: 14.7 g/dL (ref 12.0–15.0)
MCH: 28.3 pg (ref 26.0–34.0)
MCHC: 33.2 g/dL (ref 30.0–36.0)
MCV: 85.4 fL (ref 80.0–100.0)
Platelets: 266 10*3/uL (ref 150–400)
RBC: 5.19 MIL/uL — ABNORMAL HIGH (ref 3.87–5.11)
RDW: 12.6 % (ref 11.5–15.5)
WBC: 7.6 10*3/uL (ref 4.0–10.5)
nRBC: 0 % (ref 0.0–0.2)

## 2024-02-09 LAB — BASIC METABOLIC PANEL
Anion gap: 8 (ref 5–15)
BUN: 13 mg/dL (ref 6–20)
CO2: 24 mmol/L (ref 22–32)
Calcium: 9.1 mg/dL (ref 8.9–10.3)
Chloride: 105 mmol/L (ref 98–111)
Creatinine, Ser: 0.94 mg/dL (ref 0.44–1.00)
GFR, Estimated: >60 mL/min (ref >60)
Glucose, Bld: 104 mg/dL — ABNORMAL HIGH (ref 70–99)
Potassium: 3.9 mmol/L (ref 3.5–5.1)
Sodium: 137 mmol/L (ref 135–145)

## 2024-02-09 LAB — LIPASE, BLOOD: Lipase: 33 U/L (ref 11–51)

## 2024-02-09 LAB — HEPATIC FUNCTION PANEL
ALT: 40 U/L (ref 0–44)
AST: 27 U/L (ref 15–41)
Albumin: 4 g/dL (ref 3.5–5.0)
Alkaline Phosphatase: 67 U/L (ref 38–126)
Bilirubin, Direct: 0.1 mg/dL (ref 0.0–0.2)
Indirect Bilirubin: 0.9 mg/dL (ref 0.3–0.9)
Total Bilirubin: 1 mg/dL (ref 0.0–1.2)
Total Protein: 7.3 g/dL (ref 6.5–8.1)

## 2024-02-09 LAB — TROPONIN I (HIGH SENSITIVITY)
Troponin I (High Sensitivity): <2 ng/L (ref <18)
Troponin I (High Sensitivity): <2 ng/L (ref <18)

## 2024-02-09 MED ORDER — ALUM & MAG HYDROXIDE-SIMETH 200-200-20 MG/5ML PO SUSP
30.0000 mL | Freq: Once | ORAL | Status: AC
Start: 2024-02-09 — End: 2024-02-09
  Administered 2024-02-09: 30 mL via ORAL
  Filled 2024-02-09: qty 30

## 2024-02-09 MED ORDER — METOCLOPRAMIDE HCL 5 MG/ML IJ SOLN
10.0000 mg | Freq: Once | INTRAMUSCULAR | Status: AC
  Administered 2024-02-09: 10 mg via INTRAVENOUS
  Filled 2024-02-09: qty 2

## 2024-02-09 MED ORDER — SODIUM CHLORIDE 0.9 % IV BOLUS
1000.0000 mL | Freq: Once | INTRAVENOUS | Status: AC
  Administered 2024-02-09: 1000 mL via INTRAVENOUS

## 2024-02-09 MED ORDER — METOCLOPRAMIDE HCL 10 MG PO TABS: 10.0000 mg | ORAL_TABLET | Freq: Three times a day (TID) | ORAL | 0 refills | Status: AC

## 2024-02-09 NOTE — ED Provider Notes (Signed)
 [facility] Provider Note    Event Date/Time   First MD Initiated Contact with Patient 02/09/24 [id number]     (approximate)   History   Chest Pain   HPI  Debbie [name] is a 43 y.o. female who presents to the ER for evaluation of chest discomfort episode of nausea and vomiting this morning.  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  Receiving Botox injection yesterday.  Has been extensively worked up for chest pain in the past.  She denies any pleuritic pain.  No shortness of breath.  Denies any dysuria.  No fevers or chills.     Physical Exam   Triage Vital Signs: ED Triage Vitals  Encounter Vitals Group     BP 02/09/24 0825 (!) 115/91     Systolic BP Percentile --      Diastolic BP Percentile --      Pulse Rate 02/09/24 0825 88     Resp 02/09/24 0825 18     Temp 02/09/24 0825 98.6 F (37 C)     Temp Source 02/09/24 0825 Oral     SpO2 02/09/24 0825 96 %     Weight 02/09/24 0822 238 lb 1.6 oz (108 kg)     Height 02/09/24 0822 5\' 4"  (1.626 m)     Head Circumference --      Peak Flow --      Pain Score 02/09/24 0822 7     Pain Loc --      Pain Education --      Exclude from Growth Chart --     Most recent vital signs: Vitals:   02/09/24 1030 02/09/24 1100  BP: (!) 115/97   Pulse: 70 62  Resp: (!) 9 16  Temp:    SpO2: 97%      Constitutional: Alert  Eyes: Conjunctivae are normal.  Head: Atraumatic. Nose: No congestion/rhinnorhea. Mouth/Throat: Mucous membranes are moist.   Neck: Painless ROM.  Cardiovascular:   Good peripheral circulation. Respiratory: Normal respiratory effort.  No retractions.  Gastrointestinal: Soft and nontender in all 4 quadrants. Musculoskeletal:  no deformity Neurologic:  MAE spontaneously. No gross focal neurologic deficits are appreciated.  Skin:  Skin is warm, dry and intact. No rash noted. Psychiatric: Mood and affect are normal. Speech and behavior are normal.    ED Results / Procedures  / Treatments   Labs (all labs ordered are listed, but only abnormal results are displayed) Labs Reviewed  BASIC METABOLIC PANEL - Abnormal; Notable for the following components:      Result Value   Glucose, Bld 104 (*)    All other components within normal limits  CBC - Abnormal; Notable for the following components:   RBC 5.19 (*)    All other components within normal limits  HEPATIC FUNCTION PANEL  LIPASE, BLOOD  TROPONIN I (HIGH SENSITIVITY)  TROPONIN I (HIGH SENSITIVITY)     EKG  ED ECG REPORT I, Willy Eddy, the attending physician, personally viewed and interpreted this ECG.   Date: 02/09/2024  EKG Time: 8:23  Rate: 90  Rhythm: sinus  Axis: right  Intervals  normal  ST&T Change: no stemi, no depresions    RADIOLOGY Please see ED Course for my review and interpretation.  I personally reviewed all radiographic images ordered to evaluate for the above acute complaints and reviewed radiology reports and findings.  These findings were personally discussed with the patient.  Please see medical record for radiology report.  PROCEDURES:  Critical Care performed: No  Procedures   MEDICATIONS ORDERED IN ED: Medications  metoCLOPramide (REGLAN) injection 10 mg (10 mg Intravenous Given 02/09/24 0918)  sodium chloride 0.9 % bolus 1,000 mL (0 mLs Intravenous Stopped 02/09/24 1039)  alum & mag hydroxide-simeth (MAALOX/MYLANTA) 200-200-20 MG/5ML suspension 30 mL (30 mLs Oral Given 02/09/24 0910)     IMPRESSION / MDM / ASSESSMENT AND PLAN / ED COURSE  I reviewed the triage vital signs and the nursing notes.                              Differential diagnosis includes, but is not limited to, ACS, pericarditis, esophagitis, boerhaaves, pe, dissection, pna, bronchitis, costochondritis  Patient presenting to the ER for evaluation of symptoms as described above.  Based on symptoms, risk factors and considered above differential, this presenting complaint could  reflect a potentially life-threatening illness therefore the patient will be placed on continuous pulse oximetry and telemetry for monitoring.  Laboratory evaluation will be sent to evaluate for the above complaints.  Patient clinically nontoxic-appearing abdominal exam is soft and benign.  Given age and risk factors will order serial troponins.  Will give IV fluids as well as antiemetic.  Possible gastritis or esophagitis.  Chest x-ray on my review and interpretation without evidence of infiltrate or edema.  She lowers by Wells criteria is PERC negative.   Clinical Course as of 02/09/24 1126  Tue Feb 09, 2024  1108 Workup thus far is reassuring.  Troponin negative.  No persistent vomiting.  Do not feel that CT imaging clinically indicated based on his presentation. [PR]  1122 Patient reassessed.  Feels well.  Workup is reassuring.  She does appear stable and appropriate for outpatient follow-up. [PR]    Clinical Course User Index [PR] Willy Eddy, MD     FINAL CLINICAL IMPRESSION(S) / ED DIAGNOSES   Final diagnoses:  Atypical chest pain     Rx / DC Orders   ED Discharge Orders          Ordered    metoCLOPramide (REGLAN) 10 MG tablet  3 times daily with meals        02/09/24 1125             Note:  This document was prepared using Dragon voice recognition software and may include unintentional dictation errors.    Willy Eddy, MD 02/09/24 1126

## 2024-02-09 NOTE — ED Notes (Signed)
 Pt stating having some upper centralized/left sided chest pain that hurts with breathing. Pt states 7/10.

## 2024-02-09 NOTE — ED Triage Notes (Signed)
 Patient to ED via POV for CP. States left-sided, non-radiating that started at 0300. Pt reports vomiting this AM as well as getting light headed. Denies cardiac hx.

## 2024-02-16 ENCOUNTER — Ambulatory Visit (INDEPENDENT_AMBULATORY_CARE_PROVIDER_SITE_OTHER): Payer: 59 | Admitting: Family Medicine

## 2024-02-16 ENCOUNTER — Encounter (INDEPENDENT_AMBULATORY_CARE_PROVIDER_SITE_OTHER): Payer: Self-pay | Admitting: Family Medicine

## 2024-02-16 DIAGNOSIS — R7303 Prediabetes: Secondary | ICD-10-CM

## 2024-02-16 DIAGNOSIS — Z6841 Body Mass Index (BMI) 40.0 and over, adult: Secondary | ICD-10-CM

## 2024-02-16 DIAGNOSIS — E669 Obesity, unspecified: Secondary | ICD-10-CM | POA: Diagnosis not present

## 2024-02-16 MED ORDER — CONTINUOUS GLUCOSE MONITOR SUP MISC: 1.0000 [IU] | 2 refills | Status: DC

## 2024-02-16 MED ORDER — SEMAGLUTIDE (1 MG/DOSE) 4 MG/3ML ~~LOC~~ SOPN: 1.0000 mg | PEN_INJECTOR | SUBCUTANEOUS | 0 refills | Status: DC

## 2024-02-16 NOTE — Progress Notes (Signed)
 SUBJECTIVE:  Chief Complaint: Obesity  Interim History: Patient has been sick with pneumonia over the last month.  She has been working out more frequently now that she is feeling better and she and her family have all been eating more nutritiously.  Spring break is coming up and she and her kids are planning on staying here.  Planning for a vacation in June and early July. She is able to get all the food in on the plan but does find ways to get all the nutrition in.  Debbie [name] is here to discuss her progress with her obesity treatment plan. She is on the Category 3 Plan and states she is following her eating plan approximately 85 % of the time. She states she is exercising push ups, squats, walking one mile, Pilate's 25 minutes 4 times per week.   OBJECTIVE: Visit Diagnoses: Problem List Items Addressed This Visit       Other   Other porphyria (HCC) - Primary   Patient has experienced a few porphyria blood sugar dips recently so she is interested in a CGM to monitor her blood sugars to prevent an attack.  CGM prescription sent in and we discussed the possibility that insurance may not pay for it and she may need to pay for out of pocket.  Will discuss blood sugar trends at next appointment.      Relevant Medications   Continuous Glucose Monitor Sup MISC   Semaglutide, 1 MG/DOSE, 4 MG/3ML SOPN   BMI 40.0-44.9, adult (HCC)   Relevant Medications   Semaglutide, 1 MG/DOSE, 4 MG/3ML SOPN   Obesity, Beginning BMI 42.7   Relevant Medications   Semaglutide, 1 MG/DOSE, 4 MG/3ML SOPN   Prediabetes   Patient doing well on ozempic currently.  She is feeling more desires to get higher carbs in.  Will increase to 0.5mg  weekly and follow up on any side effects at next appointment.      Relevant Medications   Semaglutide, 1 MG/DOSE, 4 MG/3ML SOPN    Vitals Temp: 98 F (36.7 C) BP: (!) 134/96 (manual) Pulse Rate: 82 SpO2: 98 %   Anthropometric Measurements Height: 5\' 4"  (1.626 m) Weight:  238 lb (108 kg) BMI (Calculated): 40.83 Weight at Last Visit: 241 lb Weight Lost Since Last Visit: 3 lb Weight Gained Since Last Visit: 0 Starting Weight: 249 lb Total Weight Loss (lbs): 11 lb (4.99 kg) Peak Weight: 256 lb   Body Composition  Body Fat %: 46.4 % Fat Mass (lbs): 110.6 lbs Muscle Mass (lbs): 121.4 lbs Total Body Water (lbs): 89.2 lbs Visceral Fat Rating : 13   Other Clinical Data Fasting: no Labs: no Today's Visit #: 8 Starting Date: 08/13/23     ASSESSMENT AND PLAN:  Diet: Debbie [name] is currently in the action stage of change. As such, her goal is to continue with weight loss efforts and has agreed to the Category 3 Plan.   Exercise:  For substantial health benefits, adults should do at least 150 minutes (2 hours and 30 minutes) a week of moderate-intensity, or 75 minutes (1 hour and 15 minutes) a week of vigorous-intensity aerobic physical activity, or an equivalent combination of moderate- and vigorous-intensity aerobic activity. Aerobic activity should be performed in episodes of at least 10 minutes, and preferably, it should be spread throughout the week.  Behavior Modification:  We discussed the following Behavioral Modification Strategies today: increasing lean protein intake, increasing vegetables, meal planning and cooking strategies, keeping healthy foods in the home, and  planning for success.   Return in about 4 weeks (around 03/15/2024).Marland Kitchen She was informed of the importance of frequent follow up visits to maximize her success with intensive lifestyle modifications for her multiple health conditions.  Attestation Statements:   Reviewed by clinician on day of visit: allergies, medications, problem list, medical history, surgical history, family history, social history, and previous encounter notes.    Reuben Likes, MD

## 2024-02-16 NOTE — Assessment & Plan Note (Signed)
 Patient has experienced a few porphyria blood sugar dips recently so she is interested in a CGM to monitor her blood sugars to prevent an attack.  CGM prescription sent in and we discussed the possibility that insurance may not pay for it and she may need to pay for out of pocket.  Will discuss blood sugar trends at next appointment.

## 2024-02-16 NOTE — Assessment & Plan Note (Signed)
 Patient doing well on ozempic currently.  She is feeling more desires to get higher carbs in.  Will increase to 0.5mg  weekly and follow up on any side effects at next appointment.

## 2024-02-17 ENCOUNTER — Telehealth (INDEPENDENT_AMBULATORY_CARE_PROVIDER_SITE_OTHER): Payer: Self-pay

## 2024-02-17 ENCOUNTER — Encounter (INDEPENDENT_AMBULATORY_CARE_PROVIDER_SITE_OTHER): Payer: Self-pay | Admitting: Family Medicine

## 2024-02-17 ENCOUNTER — Other Ambulatory Visit (INDEPENDENT_AMBULATORY_CARE_PROVIDER_SITE_OTHER): Payer: Self-pay | Admitting: Family Medicine

## 2024-02-17 DIAGNOSIS — R7303 Prediabetes: Secondary | ICD-10-CM

## 2024-02-17 MED ORDER — DEXCOM G7 RECEIVER DEVI
0 refills | Status: DC
Start: 2024-02-17 — End: 2024-04-12

## 2024-02-17 MED ORDER — DEXCOM G7 SENSOR MISC: 0 refills | Status: DC

## 2024-02-17 NOTE — Addendum Note (Signed)
 Addended by: Kirke Corin A on: 02/17/2024 08:22 AM   Modules accepted: Orders

## 2024-02-17 NOTE — Telephone Encounter (Signed)
 PA started

## 2024-02-17 NOTE — Telephone Encounter (Signed)
 Dear Debbie [name]: [facility]  received a request from your provider for coverage of Ozempic (semaglutide). The request was denied because: Your plan only covers this drug when it is used for certain health conditions. Covered use is for type 2 diabetes. Your plan does not cover this drug for your health condition that your doctor told us you have. We reviewed the information we had. Your request has been denied. Your doctor can send [place] any new or missing information for [place] to review. For this drug, you may have to meet other criteria. You can request the drug policy for more details. You can also request other plan documents for your review.

## 2024-02-18 ENCOUNTER — Other Ambulatory Visit (INDEPENDENT_AMBULATORY_CARE_PROVIDER_SITE_OTHER): Payer: Self-pay | Admitting: Family Medicine

## 2024-02-18 DIAGNOSIS — R7303 Prediabetes: Secondary | ICD-10-CM

## 2024-02-18 NOTE — Telephone Encounter (Signed)
 PA started

## 2024-02-22 ENCOUNTER — Telehealth (INDEPENDENT_AMBULATORY_CARE_PROVIDER_SITE_OTHER): Payer: Self-pay

## 2024-02-22 NOTE — Telephone Encounter (Signed)
 [facility]  received a request from your provider for coverage of Dexcom G7 Mis Sensor. The request was denied because: Your plan only covers this product when it is used for certain health conditions. Covered uses are diabetes mellitus, and glycogen storage disease. Your plan does not cover this product for your health condition that your doctor told us you have.

## 2024-02-22 NOTE — Telephone Encounter (Signed)
 Dear Debbie [name]: [facility]  received a request from your provider for coverage of Ozempic (semaglutide). The request was denied because: Your plan only covers this drug when it is used for certain health conditions. Covered use is for type 2 diabetes. Your plan does not cover this drug for your health condition that your doctor told us you have

## 2024-02-29 ENCOUNTER — Encounter (INDEPENDENT_AMBULATORY_CARE_PROVIDER_SITE_OTHER): Payer: Self-pay | Admitting: Family Medicine

## 2024-02-29 NOTE — Telephone Encounter (Signed)
**Note De-identified  [place] Obfuscation** Please advise 

## 2024-03-01 NOTE — Telephone Encounter (Signed)
**Note De-identified  [place] Obfuscation** Please advise 

## 2024-03-21 ENCOUNTER — Encounter (INDEPENDENT_AMBULATORY_CARE_PROVIDER_SITE_OTHER): Payer: Self-pay | Admitting: Family Medicine

## 2024-03-21 ENCOUNTER — Ambulatory Visit (INDEPENDENT_AMBULATORY_CARE_PROVIDER_SITE_OTHER): Admitting: Family Medicine

## 2024-03-21 VITALS — BP 105/70 | HR 80 | Temp 98.4°F | Ht 64.0 in | Wt 229.0 lb

## 2024-03-21 DIAGNOSIS — E782 Mixed hyperlipidemia: Secondary | ICD-10-CM

## 2024-03-21 DIAGNOSIS — E669 Obesity, unspecified: Secondary | ICD-10-CM

## 2024-03-21 DIAGNOSIS — Z6839 Body mass index (BMI) 39.0-39.9, adult: Secondary | ICD-10-CM | POA: Diagnosis not present

## 2024-03-21 DIAGNOSIS — E785 Hyperlipidemia, unspecified: Secondary | ICD-10-CM

## 2024-03-21 DIAGNOSIS — R7303 Prediabetes: Secondary | ICD-10-CM | POA: Diagnosis not present

## 2024-03-21 NOTE — Assessment & Plan Note (Signed)
 Doing well on semaglutide  0.5mg - has been slowly titrating medication up.  Some significant satiety and nausea with current dose.  No change currently on medication.  No refill needed.

## 2024-03-21 NOTE — Progress Notes (Signed)
 SUBJECTIVE:  Chief Complaint: Obesity  Interim History: Over the last month patient has been doing more physical activity and has been walking 1 mile every other day.  She is doing well with her food intake and getting everything on plan in.  Her back is feeling better with her increased activity.  Her toddler is pushing her to get walks in more frequently.   Debbie [name] is here to discuss her progress with her obesity treatment plan. She is on the Category 3 Plan and states she is following her eating plan approximately 100 % of the time. She states she is walking 1 mile 3-4 times per week.   OBJECTIVE: Visit Diagnoses: Problem List Items Addressed This Visit       Other   Hyperlipidemia - Primary   Last labs done 6 months ago.  Needs repeat labs at next appointment- will make fasting appointment.  Last LDL elevated.  The 10-year ASCVD risk score (Arnett DK, et al., 2019) is: 1.3%   Values used to calculate the score:     Age: 43 years     Sex: Female     Is Non-Hispanic African American: No     Diabetic: No     Tobacco smoker: No     Systolic Blood Pressure: 105 mmHg     Is BP treated: No     HDL Cholesterol: 35 mg/dL     Total Cholesterol: 231 mg/dL       Obesity, Beginning BMI 42.7   Prediabetes   Doing well on semaglutide  0.5mg - has been slowly titrating medication up.  Some significant satiety and nausea with current dose.  No change currently on medication.  No refill needed.      Other Visit Diagnoses       BMI 39.0-39.9,adult           Vitals Temp: 98.4 F (36.9 C) BP: 105/70 Pulse Rate: 80 SpO2: 99 %   Anthropometric Measurements Height: 5\' 4"  (1.626 m) Weight: 229 lb (103.9 kg) BMI (Calculated): 39.29 Weight at Last Visit: 238 lb Weight Lost Since Last Visit: 9 lb Weight Gained Since Last Visit: 0 Starting Weight: 249 lb Total Weight Loss (lbs): 20 lb (9.072 kg) Peak Weight: 256 lb   Body Composition  Body Fat %: 43.9 % Fat Mass (lbs): 100.8  lbs Muscle Mass (lbs): 122.2 lbs Total Body Water  (lbs): 83.8 lbs Visceral Fat Rating : 12   Other Clinical Data Fasting: no Labs: no Today's Visit #: 9 Starting Date: 08/13/23 Comments: Cat 3     ASSESSMENT AND PLAN:  Diet: Debbie [name] is currently in the action stage of change. As such, her goal is to continue with weight loss efforts and has agreed to the Category 3 Plan.   Exercise:  For substantial health benefits, adults should do at least 150 minutes (2 hours and 30 minutes) a week of moderate-intensity, or 75 minutes (1 hour and 15 minutes) a week of vigorous-intensity aerobic physical activity, or an equivalent combination of moderate- and vigorous-intensity aerobic activity. Aerobic activity should be performed in episodes of at least 10 minutes, and preferably, it should be spread throughout the week.  Behavior Modification:  We discussed the following Behavioral Modification Strategies today: increasing lean protein intake, decreasing simple carbohydrates, meal planning and cooking strategies, keeping healthy foods in the home, and planning for success.   Return in about 4 weeks (around 04/18/2024).Aaron Aas She was informed of the importance of frequent follow up visits to maximize her success  with intensive lifestyle modifications for her multiple health conditions.  Attestation Statements:   Reviewed by clinician on day of visit: allergies, medications, problem list, medical history, surgical history, family history, social history, and previous encounter notes.   Donaciano Frizzle, MD

## 2024-03-21 NOTE — Assessment & Plan Note (Signed)
 Last labs done 6 months ago.  Needs repeat labs at next appointment- will make fasting appointment.  Last LDL elevated.  The 10-year ASCVD risk score (Arnett DK, et al., 2019) is: 1.3%   Values used to calculate the score:     Age: 43 years     Sex: Female     Is Non-Hispanic African American: No     Diabetic: No     Tobacco smoker: No     Systolic Blood Pressure: 105 mmHg     Is BP treated: No     HDL Cholesterol: 35 mg/dL     Total Cholesterol: 231 mg/dL

## 2024-04-04 ENCOUNTER — Encounter: Payer: Self-pay | Admitting: Cardiology

## 2024-04-12 ENCOUNTER — Telehealth (INDEPENDENT_AMBULATORY_CARE_PROVIDER_SITE_OTHER): Payer: Self-pay

## 2024-04-12 ENCOUNTER — Ambulatory Visit (INDEPENDENT_AMBULATORY_CARE_PROVIDER_SITE_OTHER): Admitting: Adult Health

## 2024-04-12 ENCOUNTER — Encounter (INDEPENDENT_AMBULATORY_CARE_PROVIDER_SITE_OTHER): Payer: Self-pay | Admitting: Adult Health

## 2024-04-12 VITALS — BP 126/76 | HR 71 | Temp 98.1°F | Ht 64.0 in | Wt 226.0 lb

## 2024-04-12 DIAGNOSIS — E559 Vitamin D deficiency, unspecified: Secondary | ICD-10-CM

## 2024-04-12 DIAGNOSIS — R7303 Prediabetes: Secondary | ICD-10-CM

## 2024-04-12 DIAGNOSIS — E669 Obesity, unspecified: Secondary | ICD-10-CM

## 2024-04-12 DIAGNOSIS — E782 Mixed hyperlipidemia: Secondary | ICD-10-CM | POA: Diagnosis not present

## 2024-04-12 DIAGNOSIS — Z6838 Body mass index (BMI) 38.0-38.9, adult: Secondary | ICD-10-CM

## 2024-04-12 MED ORDER — DEXCOM G7 RECEIVER DEVI: 0 refills | Status: DC

## 2024-04-12 MED ORDER — DEXCOM G7 SENSOR MISC: 0 refills | Status: DC

## 2024-04-12 NOTE — Telephone Encounter (Signed)
 PA for Dexcom G7 Sensor has been submitted, awaiting PA questions.

## 2024-04-12 NOTE — Progress Notes (Signed)
 WEIGHT SUMMARY AND BIOMETRICS  Vitals Temp: 98.1 F (36.7 C) BP: 126/76 Pulse Rate: 71 SpO2: 97 %   Anthropometric Measurements Height: 5\' 4"  (1.626 m) Weight: 226 lb (102.5 kg) BMI (Calculated): 38.77 Weight at Last Visit: 229lb Weight Lost Since Last Visit: 3lb Weight Gained Since Last Visit: 0 Starting Weight: 249lb Total Weight Loss (lbs): 23 lb (10.4 kg) Peak Weight: 256lb   Body Composition  Body Fat %: 43.9 % Fat Mass (lbs): 99.2 lbs Muscle Mass (lbs): 120.4 lbs Total Body Water  (lbs): 85 lbs Visceral Fat Rating : 11   Other Clinical Data Fasting: yes Labs: yes Today's Visit #: 10 Starting Date: 08/13/23    Chief Complaint:   OBESITY Debbie [name] is here to discuss her progress with her obesity treatment plan.  She is on the the Category 3 Plan and states she is following her eating plan approximately 90 % of the time.  She states she is exercising Walking around her neighborhood 40 minutes 5-7 times per week.  Interim History:  She is currently on weekly Semaglutide  0.5mg  (equates to 36 clicks) She is paying cash for GLP-1 therapy and the Rx of written for 1mg  weekly- she is able to extend her supply with her current dosing.  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 Mixed hyperlipidemia Lipid Panel     Component Value Date/Time   CHOL 231 (H) 08/13/2023 1007   TRIG 276 (H) 08/13/2023 1007   HDL 35 (L) 08/13/2023 1007   CHOLHDL 4.7 (H) 02/06/2023 0812   LDLCALC 145 (H) 08/13/2023 1007   LABVLDL 51 (H) 08/13/2023 1007     2. Other porphyria (HCC) She will increase simple CHO intake to treat sx's Dexcom has been ordered in past- has yet to receive CGM system Will attempt again today  3. Prediabetes Lab Results   Component Value Date   HGBA1C 6.1 (H) 08/13/2023     Latest Reference Range & Units 08/31/23 09:28 02/02/24 20:15 02/09/24 08:25  Glucose 70 - 99 mg/dL 91 161 (H) 096 (H)  (H): Data is abnormally high  She is currently on weekly Semaglutide  0.5mg  (equates to 36 clicks) She is paying cash for GLP-1 therapy and the Rx of written for 1mg  weekly- she is able to extend her supply with her current dosing.  Denies mass in neck, dysphagia, dyspepsia, persistent hoarseness, abdominal pain, or N/V/C  She denies worsening anxiety or insomnia  4. Vitamin D  deficiency She endorses stable energy levels   Assessment/Plan:   1. Mixed hyperlipidemia (Primary) Check Labs - Comprehensive metabolic panel with GFR - Lipid panel  2. Other porphyria (HCC) Refill - Continuous Glucose Receiver (DEXCOM G7 RECEIVER) DEVI; Use as directed  Dispense: 1 each; Refill: 0 - Continuous Glucose Sensor (DEXCOM G7 SENSOR) MISC; Replace sensor every 10 days  Dispense: 4 each; Refill: 0  3. Prediabetes Check Labs and Refill - Continuous Glucose Receiver (DEXCOM G7 RECEIVER) DEVI; Use as directed  Dispense: 1 each; Refill: 0 - Continuous Glucose Sensor (DEXCOM G7 SENSOR) MISC; Replace sensor every 10 days  Dispense: 4 each; Refill: 0 - Hemoglobin A1c - Insulin , random  4. Vitamin D  deficiency Check Labs - VITAMIN D  25 Hydroxy (Vit-D Deficiency, Fractures)  5. Obesity (  BMI 30-39.9), CURRENT BMI 38.77  Debbie [name] is currently in the action stage of change. As such, her goal is to continue with weight loss efforts. She has agreed to the Category 3 Plan.   Exercise goals: For substantial health benefits, adults should do at least 150 minutes (2 hours and 30 minutes) a week of moderate-intensity, or 75 minutes (1 hour and 15 minutes) a week of vigorous-intensity aerobic physical activity, or an equivalent combination of moderate- and vigorous-intensity aerobic activity. Aerobic activity should be performed in episodes of  at least 10 minutes, and preferably, it should be spread throughout the week.  Behavioral modification strategies: increasing lean protein intake, decreasing simple carbohydrates, increasing vegetables, increasing water  intake, no skipping meals, meal planning and cooking strategies, keeping healthy foods in the home, and planning for success.  Debbie [name] has agreed to follow-up with our clinic in 4 weeks. She was informed of the importance of frequent follow-up visits to maximize her success with intensive lifestyle modifications for her multiple health conditions.   Debbie [name] was informed we would discuss her lab results at her next visit unless there is a critical issue that needs to be addressed sooner. Debbie [name] agreed to keep her next visit at the agreed upon time to discuss these results.  Objective:   Blood pressure 126/76, pulse 71, temperature 98.1 F (36.7 C), height 5\' 4"  (1.626 m), weight 226 lb (102.5 kg), last menstrual period 08/20/2022, SpO2 97%. Body mass index is 38.79 kg/m.  General: Cooperative, alert, well developed, in no acute distress. HEENT: Conjunctivae and lids unremarkable. Cardiovascular: Regular rhythm.  Lungs: Normal work of breathing. Neurologic: No focal deficits.   Lab Results  Component Value Date   CREATININE 0.94 02/09/2024   BUN 13 02/09/2024   NA 137 02/09/2024   K 3.9 02/09/2024   CL 105 02/09/2024   CO2 24 02/09/2024   Lab Results  Component Value Date   ALT 40 02/09/2024   AST 27 02/09/2024   ALKPHOS 67 02/09/2024   BILITOT 1.0 02/09/2024   Lab Results  Component Value Date   HGBA1C 6.1 (H) 08/13/2023   Lab Results  Component Value Date   INSULIN  40.5 (H) 08/13/2023   Lab Results  Component Value Date   TSH 0.423 (L) 08/13/2023   Lab Results  Component Value Date   CHOL 231 (H) 08/13/2023   HDL 35 (L) 08/13/2023   LDLCALC 145 (H) 08/13/2023   TRIG 276 (H) 08/13/2023   CHOLHDL 4.7 (H) 02/06/2023   Lab Results  Component Value Date    VD25OH 43.1 08/13/2023   Lab Results  Component Value Date   WBC 7.6 02/09/2024   HGB 14.7 02/09/2024   HCT 44.3 02/09/2024   MCV 85.4 02/09/2024   PLT 266 02/09/2024   No results found for: "IRON", "TIBC", "FERRITIN"  Attestation Statements:   Reviewed by clinician on day of visit: allergies, medications, problem list, medical history, surgical history, family history, social history, and previous encounter notes.  I have reviewed the above documentation for accuracy and completeness, and I agree with the above. -  Debbie [name] d. Sheamus Hasting,NP-C

## 2024-04-12 NOTE — Telephone Encounter (Signed)
 PA questions for Dexcom G7 Sensor  have been answered and all documentation has been included. Waiting on a determination.

## 2024-04-12 NOTE — Telephone Encounter (Signed)
 PA for Dexcom G7 Sensor has been denied. PA is now complete.

## 2024-04-13 LAB — COMPREHENSIVE METABOLIC PANEL WITH GFR
ALT: 25 IU/L (ref 0–32)
AST: 19 IU/L (ref 0–40)
Albumin: 4.3 g/dL (ref 3.9–4.9)
Alkaline Phosphatase: 91 IU/L (ref 44–121)
BUN/Creatinine Ratio: 9 (ref 9–23)
BUN: 8 mg/dL (ref 6–24)
Bilirubin Total: 0.4 mg/dL (ref 0.0–1.2)
CO2: 19 mmol/L — ABNORMAL LOW (ref 20–29)
Calcium: 9.2 mg/dL (ref 8.7–10.2)
Chloride: 104 mmol/L (ref 96–106)
Creatinine, Ser: 0.89 mg/dL (ref 0.57–1.00)
Globulin, Total: 2.1 g/dL (ref 1.5–4.5)
Glucose: 84 mg/dL (ref 70–99)
Potassium: 4.5 mmol/L (ref 3.5–5.2)
Sodium: 139 mmol/L (ref 134–144)
Total Protein: 6.4 g/dL (ref 6.0–8.5)
eGFR: 83 mL/min/{1.73_m2} (ref >59)

## 2024-04-13 LAB — LIPID PANEL
Chol/HDL Ratio: 5.9 ratio — ABNORMAL HIGH (ref 0.0–4.4)
Cholesterol, Total: 202 mg/dL — ABNORMAL HIGH (ref 100–199)
HDL: 34 mg/dL — ABNORMAL LOW (ref >39)
LDL Chol Calc (NIH): 129 mg/dL — ABNORMAL HIGH (ref 0–99)
Triglycerides: 216 mg/dL — ABNORMAL HIGH (ref 0–149)
VLDL Cholesterol Cal: 39 mg/dL (ref 5–40)

## 2024-04-13 LAB — HEMOGLOBIN A1C
Est. average glucose Bld gHb Est-mCnc: 111 mg/dL
Hgb A1c MFr Bld: 5.5 % (ref 4.8–5.6)

## 2024-04-13 LAB — INSULIN, RANDOM: INSULIN: 33.6 u[IU]/mL — ABNORMAL HIGH (ref 2.6–24.9)

## 2024-04-13 LAB — VITAMIN D 25 HYDROXY (VIT D DEFICIENCY, FRACTURES): Vit D, 25-Hydroxy: 28.6 ng/mL — ABNORMAL LOW (ref 30.0–100.0)

## 2024-04-20 ENCOUNTER — Other Ambulatory Visit: Payer: Self-pay

## 2024-04-25 ENCOUNTER — Other Ambulatory Visit: Payer: Self-pay

## 2024-04-25 NOTE — Progress Notes (Signed)
 Specialty Pharmacy Refill Coordination Note  Jennae Hakeem is a 44 y.o. female contacted today regarding refills of specialty medication(s) OnabotulinumtoxinA  (Botox )   Patient requested Courier to Provider Office   Delivery date: 05/02/24   Verified address: [facility] [address] [place] [address]   Medication will be filled on 04/29/24.

## 2024-04-29 ENCOUNTER — Other Ambulatory Visit: Payer: Self-pay
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
## 2024-05-05 ENCOUNTER — Ambulatory Visit: Admitting: Family Medicine

## 2024-05-05 ENCOUNTER — Other Ambulatory Visit: Payer: Self-pay

## 2024-05-05 ENCOUNTER — Other Ambulatory Visit: Payer: Self-pay | Admitting: Urology

## 2024-05-05 ENCOUNTER — Encounter: Payer: Self-pay | Admitting: Family Medicine

## 2024-05-05 VITALS — BP 124/81 | HR 78

## 2024-05-05 DIAGNOSIS — G43709 Chronic migraine without aura, not intractable, without status migrainosus: Secondary | ICD-10-CM | POA: Diagnosis not present

## 2024-05-05 MED ORDER — ONABOTULINUMTOXINA 200 UNITS IJ SOLR
155.0000 [IU] | Freq: Once | INTRAMUSCULAR | Status: AC
  Administered 2024-05-05: 155 [IU] via INTRAMUSCULAR

## 2024-05-05 NOTE — Progress Notes (Signed)
 Botox - 200 units x 1 vial Lot: [id number] Expiration: 08/2026 NDC: 1478-2956-21  Bacteriostatic 0.9% Sodium Chloride - 30 mL  HYQ:[id number] Expiration: JUN-30-2026 NDC: 9629-5284-13  Dx:G43.709 S/P Witnessed by: Santana Cue, CMA

## 2024-05-06 ENCOUNTER — Other Ambulatory Visit: Payer: Self-pay | Admitting: Urology

## 2024-05-13 ENCOUNTER — Other Ambulatory Visit: Payer: Self-pay

## 2024-05-13 ENCOUNTER — Encounter (HOSPITAL_COMMUNITY): Payer: Self-pay

## 2024-05-13 ENCOUNTER — Emergency Department (HOSPITAL_COMMUNITY)

## 2024-05-13 ENCOUNTER — Emergency Department (HOSPITAL_COMMUNITY)
Admission: EM | Admit: 2024-05-13 | Discharge: 2024-05-13 | Disposition: A | Discharge status: Home / Self Care | Attending: Emergency Medicine | Admitting: Emergency Medicine

## 2024-05-13 DIAGNOSIS — R1110 Vomiting, unspecified: Secondary | ICD-10-CM | POA: Insufficient documentation

## 2024-05-13 DIAGNOSIS — R0789 Other chest pain: Secondary | ICD-10-CM | POA: Insufficient documentation

## 2024-05-13 DIAGNOSIS — I10 Essential (primary) hypertension: Secondary | ICD-10-CM | POA: Insufficient documentation

## 2024-05-13 DIAGNOSIS — Z794 Long term (current) use of insulin: Secondary | ICD-10-CM | POA: Insufficient documentation

## 2024-05-13 DIAGNOSIS — R109 Unspecified abdominal pain: Secondary | ICD-10-CM

## 2024-05-13 DIAGNOSIS — Z91040 Latex allergy status: Secondary | ICD-10-CM | POA: Insufficient documentation

## 2024-05-13 DIAGNOSIS — Z79899 Other long term (current) drug therapy: Secondary | ICD-10-CM | POA: Insufficient documentation

## 2024-05-13 DIAGNOSIS — R1084 Generalized abdominal pain: Secondary | ICD-10-CM | POA: Diagnosis not present

## 2024-05-13 LAB — HEPATIC FUNCTION PANEL
ALT: 23 U/L (ref 0–44)
AST: 23 U/L (ref 15–41)
Albumin: 4 g/dL (ref 3.5–5.0)
Alkaline Phosphatase: 77 U/L (ref 38–126)
Bilirubin, Direct: <0.1 mg/dL (ref 0.0–0.2)
Total Bilirubin: 0.8 mg/dL (ref 0.0–1.2)
Total Protein: 8.1 g/dL (ref 6.5–8.1)

## 2024-05-13 LAB — BASIC METABOLIC PANEL WITH GFR
Anion gap: 12 (ref 5–15)
BUN: 9 mg/dL (ref 6–20)
CO2: 21 mmol/L — ABNORMAL LOW (ref 22–32)
Calcium: 9.4 mg/dL (ref 8.9–10.3)
Chloride: 103 mmol/L (ref 98–111)
Creatinine, Ser: 1.09 mg/dL — ABNORMAL HIGH (ref 0.44–1.00)
GFR, Estimated: >60 mL/min (ref >60)
Glucose, Bld: 140 mg/dL — ABNORMAL HIGH (ref 70–99)
Potassium: 4.5 mmol/L (ref 3.5–5.1)
Sodium: 136 mmol/L (ref 135–145)

## 2024-05-13 LAB — CBC
HCT: 46.7 % — ABNORMAL HIGH (ref 36.0–46.0)
Hemoglobin: 15.1 g/dL — ABNORMAL HIGH (ref 12.0–15.0)
MCH: 27.7 pg (ref 26.0–34.0)
MCHC: 32.3 g/dL (ref 30.0–36.0)
MCV: 85.5 fL (ref 80.0–100.0)
Platelets: 318 10*3/uL (ref 150–400)
RBC: 5.46 MIL/uL — ABNORMAL HIGH (ref 3.87–5.11)
RDW: 13 % (ref 11.5–15.5)
WBC: 10.2 10*3/uL (ref 4.0–10.5)
nRBC: 0 % (ref 0.0–0.2)

## 2024-05-13 LAB — TROPONIN I (HIGH SENSITIVITY): Troponin I (High Sensitivity): 6 ng/L (ref <18)

## 2024-05-13 LAB — LIPASE, BLOOD: Lipase: 37 U/L (ref 11–51)

## 2024-05-13 LAB — HCG, SERUM, QUALITATIVE: Preg, Serum: NEGATIVE

## 2024-05-13 MED ORDER — IOHEXOL 350 MG/ML SOLN
75.0000 mL | Freq: Once | INTRAVENOUS | Status: AC | PRN
  Administered 2024-05-13: 75 mL via INTRAVENOUS

## 2024-05-13 MED ORDER — MORPHINE SULFATE (PF) 4 MG/ML IV SOLN: 4.0000 mg | Freq: Once | INTRAVENOUS | Status: DC

## 2024-05-13 MED ORDER — ONDANSETRON HCL 4 MG/2ML IJ SOLN
4.0000 mg | Freq: Once | INTRAMUSCULAR | Status: AC
  Administered 2024-05-13: 4 mg via INTRAVENOUS
  Filled 2024-05-13: qty 2

## 2024-05-13 MED ORDER — HYDROMORPHONE HCL 1 MG/ML IJ SOLN
1.0000 mg | Freq: Once | INTRAMUSCULAR | Status: AC
  Administered 2024-05-13: 1 mg via INTRAVENOUS
  Filled 2024-05-13: qty 1

## 2024-05-13 MED ORDER — ALUM & MAG HYDROXIDE-SIMETH 200-200-20 MG/5ML PO SUSP
30.0000 mL | Freq: Once | ORAL | Status: AC
  Administered 2024-05-13: 30 mL via ORAL
  Filled 2024-05-13: qty 30

## 2024-05-13 MED ORDER — SODIUM CHLORIDE 0.9 % IV BOLUS
1000.0000 mL | Freq: Once | INTRAVENOUS | Status: AC
  Administered 2024-05-13: 1000 mL via INTRAVENOUS

## 2024-05-13 NOTE — ED Triage Notes (Signed)
 Says she's been having mid to left sided chest pressure 3-4 days. Pain manifested after recent Trazadone increase to 200mg .   Pt also sts she has been intermittently passing out and having facial tremors.

## 2024-05-13 NOTE — Discharge Instructions (Signed)
 Evaluation today was overall reassuring.  Please follow-up with your PCP.  Recommend that you discontinue the trazodone as it could be contributing to your symptoms.  Also recommend you continue taking the pantoprazole  as prescribed.  If you develop worsening abdominal pain, nausea vomiting diarrhea, fever or any other concerning symptom please return to the ED for further evaluation.

## 2024-05-13 NOTE — ED Provider Notes (Signed)
 [place] EMERGENCY DEPARTMENT AT [facility] Provider Note   CSN: [id number] Arrival date & time: 05/13/24  9341    Patient presents with: Chest Pain  HPI Debbie [name] is a 43 y.o. female presenting for chest pain. Started 3 to 4 days ago.  It is mid to left-sided and feels like pressure.  Denies associated shortness of breath.  Can be worse at night.  She reports that she recently increased her trazodone dose to 200 mg and believes this is causing in someway.  Also mention that she has been vomiting a couple times this past week.  Denies abdominal pain.  Had a normal bowel today.  Patient also states she has been intermittently passing out.  Past Medical History:  Diagnosis Date   ADD (attention deficit disorder)    Anxiety    self reported   Arthritis    in spine   Back pain    Chronic back pain 2021   low back/hip; had nerve ablation in lumbar/sacral joint   Constipation    Depression    self reported   Heartburn    High cholesterol    History of hiatal hernia    History of stomach ulcers    Hypertension    Joint pain    Maxillary sinus cyst    left   Menopause    Migraine    Multiple thyroid  nodules    3; 1.9 cm and 2 cm, 0.6 cm, will see general  surgeon   No pertinent past medical history    Ovarian cyst    Porphyria (HCC)    Shoulder pain    Swallowing difficulty    UTI (urinary tract infection)        Chest Pain      Prior to Admission medications   Medication Sig Start Date End Date Taking? Authorizing Provider  botulinum toxin Type A  (BOTOX ) 200 units injection Inject 155 Units into the muscle every 3 (three) months. Inject 155units to head and neck intramuscularly every 3 months. 10/05/23   Lomax, Amy, NP  Continuous Glucose Monitor Sup MISC 1 Units by Does not apply route every 30 (thirty) days. 02/16/24   Berkeley Adelita PENNER, MD  Continuous Glucose Receiver (DEXCOM G7 RECEIVER) DEVI Use as directed 04/12/24   Danford, Katy D, NP   Continuous Glucose Sensor (DEXCOM G7 SENSOR) MISC Replace sensor every 10 days 04/12/24   Danford, Katy D, NP  ibuprofen  (ADVIL ) 600 MG tablet Take 1 tablet (600 mg total) by mouth every 6 (six) hours as needed. 02/05/23   Rosalva Sawyer, MD  Insulin  Pen Needle (BD PEN NEEDLE NANO 2ND GEN) 32G X 4 MM MISC 1 Package by Does not apply route 2 (two) times daily. 12/21/23   Berkeley Adelita PENNER, MD  Magnesium  500 MG CAPS Take 500 mg by mouth.    [provider]  metoCLOPramide  (REGLAN ) 10 MG tablet Take 1 tablet (10 mg total) by mouth 3 (three) times daily with meals. 02/09/24 02/08/25  Lang Dover, MD  pantoprazole  (PROTONIX ) 40 MG tablet Take 40 mg by mouth daily. 02/02/23   [provider]  Semaglutide , 1 MG/DOSE, 4 MG/3ML SOPN Inject 1 mg as directed once a week. 02/16/24   Berkeley Adelita PENNER, MD  tiZANidine  (ZANAFLEX ) 4 MG tablet TAKE 1 TABLET (4 MG TOTAL) BY MOUTH 2 (TWO) TIMES DAILY AS NEEDED FOR MUSCLE SPASMS. 12/22/23   Smith, Zachary M, DO  traZODone (DESYREL) 50 MG tablet Take 50 mg by mouth  at bedtime. 2 times per night    [provider]    Allergies: Tylenol [acetaminophen], Aspartame, Aspirin, Benadryl [diphenhydramine hcl], Buspar [buspirone], Celexa [citalopram hydrobromide], Dilaudid  [hydromorphone  hcl], Effexor  xr [venlafaxine ], Morphine , Penicillins, Prednisone, Stevia glycerite extract [flavoring agent (non-screening)], Sucralose, Sulfamethoxazole-trimethoprim, Zoloft [sertraline], and Latex    Review of Systems  Cardiovascular:  Positive for chest pain.    Updated Vital Signs BP 113/65 (BP Location: Right Arm)   Pulse 68   Temp 97.7 F (36.5 C) (Oral)   Resp 16   Ht 5' 4 (1.626 m)   Wt 97.5 kg   LMP 08/20/2022 (Exact Date) Comment: currently menstruating  SpO2 98%   BMI 36.90 kg/m   Physical Exam Vitals and nursing note reviewed.  HENT:     Head: Normocephalic and atraumatic.     Mouth/Throat:     Mouth: Mucous membranes are moist.    Eyes:     General:        Right eye: No discharge.        Left eye: No discharge.     Conjunctiva/sclera: Conjunctivae normal.    Cardiovascular:     Rate and Rhythm: Normal rate and regular rhythm.     Pulses: Normal pulses.     Heart sounds: Normal heart sounds.  Pulmonary:     Effort: Pulmonary effort is normal.     Breath sounds: Normal breath sounds.  Abdominal:     General: Abdomen is flat.     Palpations: Abdomen is soft.     Tenderness: There is abdominal tenderness.   Skin:    General: Skin is warm and dry.   Neurological:     General: No focal deficit present.   Psychiatric:        Mood and Affect: Mood normal.     (all labs ordered are listed, but only abnormal results are displayed) Labs Reviewed  BASIC METABOLIC PANEL WITH GFR - Abnormal; Notable for the following components:      Result Value   CO2 21 (*)    Glucose, Bld 140 (*)    Creatinine, Ser 1.09 (*)    All other components within normal limits  CBC - Abnormal; Notable for the following components:   RBC 5.46 (*)    Hemoglobin 15.1 (*)    HCT 46.7 (*)    All other components within normal limits  HEPATIC FUNCTION PANEL  LIPASE, BLOOD  HCG, SERUM, QUALITATIVE  TROPONIN I (HIGH SENSITIVITY)    EKG: EKG Interpretation Date/Time:  Friday May 13 2024 07:13:21 EDT Ventricular Rate:  83 PR Interval:  152 QRS Duration:  74 QT Interval:  358 QTC Calculation: 420 R Axis:   -69  Text Interpretation: Normal sinus rhythm Left axis deviation Anterior infarct , age undetermined Confirmed by Franklyn Gills [phone]) on 05/13/2024 9:06:49 AM  Radiology: CT ABDOMEN PELVIS W CONTRAST Result Date: 05/13/2024 CLINICAL DATA:  Abdominal pain EXAM: CT ABDOMEN AND PELVIS WITH CONTRAST TECHNIQUE: Multidetector CT imaging of the abdomen and pelvis was performed using the standard protocol following bolus administration of intravenous contrast. RADIATION DOSE REDUCTION: This exam was performed according to the  departmental dose-optimization program which includes automated exposure control, adjustment of the mA and/or kV according to patient size and/or use of iterative reconstruction technique. CONTRAST:  75mL OMNIPAQUE  IOHEXOL  350 MG/ML SOLN COMPARISON:  None Available. FINDINGS: Lower chest: Lung bases are clear. Hepatobiliary: No focal hepatic lesion. Gallbladder not identified. No biliary dilatation. Pancreas: Pancreas is normal. No ductal dilatation.  No pancreatic inflammation. Spleen: Normal spleen Adrenals/urinary tract: Adrenal glands and kidneys are normal. The ureters and bladder normal. Stomach/Bowel: Stomach, small bowel, appendix, and cecum are normal. The colon and rectosigmoid colon are normal. Vascular/Lymphatic: Abdominal aorta is normal caliber. No periportal or retroperitoneal adenopathy. No pelvic adenopathy. Reproductive: Post hysterectomy.  Adnexa unremarkable Other: No free fluid. Musculoskeletal: No aggressive osseous lesion. IMPRESSION: 1. No acute findings in the abdomen pelvis. 2. Normal appendix. 3. Post hysterectomy. Electronically Signed   By: Jackquline Boxer M.D.   On: 05/13/2024 14:30   DG Chest 2 View Result Date: 05/13/2024 CLINICAL DATA:  Chest pressure. EXAM: CHEST - 2 VIEW COMPARISON:  02/09/2024 FINDINGS: The heart size and mediastinal contours are within normal limits. Both lungs are clear. The visualized skeletal structures are unremarkable. IMPRESSION: No active cardiopulmonary disease. Electronically Signed   By: Waddell Calk M.D.   On: 05/13/2024 07:26     Procedures   Medications Ordered in the ED  alum & mag hydroxide-simeth (MAALOX/MYLANTA) 200-200-20 MG/5ML suspension 30 mL (has no administration in time range)  sodium chloride  0.9 % bolus 1,000 mL (1,000 mLs Intravenous New Bag/Given 05/13/24 1012)  ondansetron  (ZOFRAN ) injection 4 mg (4 mg Intravenous Given 05/13/24 1009)  HYDROmorphone  (DILAUDID ) injection 1 mg (1 mg Intravenous Given 05/13/24 1009)  iohexol   (OMNIPAQUE ) 350 MG/ML injection 75 mL (75 mLs Intravenous Contrast Given 05/13/24 1110)    Clinical Course as of 05/13/24 1442  Fri May 13, 2024  0940 DG Chest 2 View [JR]    Clinical Course User Index [JR] Lang Norleen POUR, PA-C                                 Medical Decision Making Amount and/or Complexity of Data Reviewed Labs: ordered. Radiology: ordered. Decision-making details documented in ED Course.  Risk Prescription drug management.   Initial Impression and Ddx 43 year old well-appearing female presenting for chest pain and generalized abdominal pain.  Exam notable for generalized abdominal tenderness.  DDx includes acute pancreatitis, acute cholecystitis, kidney stones, diverticulitis, ectopic, ovarian torsion, reflux, serotonin syndrome, and other. Patient PMH that increases complexity of ED encounter:  h/o ovarian cyst  Interpretation of Diagnostics - I independent reviewed and interpreted the labs as followed: Cr elevation  - I independently visualized the following imaging with scope of interpretation limited to determining acute life threatening conditions related to emergency care: CT ab/pelvis, which revealed no acute findings  - I personally reviewed and interpreted EKG which revealed NSR  Patient Reassessment and Ultimate Disposition/Management On reassessment, she was well-appearing, abdominal pain had improved.  No witnessed vomiting during this encounter.  CT scan is unremarkable.  Fluid challenge with no issue.  Have low suspicion for serotonin syndrome given how well she appears and lack of symptoms but did advise her to discontinue the trazodone and follow-up with her PCP as it could be contributing to her symptoms in someway.  Discussed return precautions.  Patient already taking pantoprazole , advised to continue.  Discharged.   Patient management required discussion with the following services or consulting groups:  None  Complexity of Problems  Addressed Acute complicated illness or Injury  Additional Data Reviewed and Analyzed Further history obtained from: Past medical history and medications listed in the EMR and Prior ED visit notes  Patient Encounter Risk Assessment Prescriptions      Final diagnoses:  Atypical chest pain  Abdominal pain, unspecified abdominal location  ED Discharge Orders     None          Lang Norleen POUR, PA-C 05/13/24 1442    Elnor Jayson LABOR, DO 05/15/24 [phone]

## 2024-05-13 NOTE — ED Notes (Signed)
 Patient transported to CT

## 2024-05-16 ENCOUNTER — Encounter (HOSPITAL_COMMUNITY): Admission: RE | Admit: 2024-05-16 | Source: Ambulatory Visit

## 2024-05-17 ENCOUNTER — Ambulatory Visit (HOSPITAL_COMMUNITY): Admission: RE | Admit: 2024-05-17 | Source: Home / Self Care | Admitting: Urology

## 2024-05-17 ENCOUNTER — Encounter (HOSPITAL_COMMUNITY): Admission: RE | Payer: Self-pay | Source: Home / Self Care

## 2024-05-17 SURGERY — CREATION, PUBOVAGINAL SLING
Anesthesia: General

## 2024-05-18 ENCOUNTER — Ambulatory Visit: Admitting: Family Medicine

## 2024-05-19 ENCOUNTER — Encounter (INDEPENDENT_AMBULATORY_CARE_PROVIDER_SITE_OTHER): Payer: Self-pay | Admitting: Family Medicine

## 2024-05-19 ENCOUNTER — Ambulatory Visit (INDEPENDENT_AMBULATORY_CARE_PROVIDER_SITE_OTHER): Admitting: Family Medicine

## 2024-05-19 VITALS — BP 110/78 | HR 97 | Temp 98.2°F | Ht 64.0 in | Wt 213.0 lb

## 2024-05-19 DIAGNOSIS — R7303 Prediabetes: Secondary | ICD-10-CM

## 2024-05-19 DIAGNOSIS — F32A Depression, unspecified: Secondary | ICD-10-CM

## 2024-05-19 DIAGNOSIS — E669 Obesity, unspecified: Secondary | ICD-10-CM | POA: Diagnosis not present

## 2024-05-19 DIAGNOSIS — Z6836 Body mass index (BMI) 36.0-36.9, adult: Secondary | ICD-10-CM

## 2024-05-19 DIAGNOSIS — F419 Anxiety disorder, unspecified: Secondary | ICD-10-CM

## 2024-05-19 NOTE — Assessment & Plan Note (Addendum)
 Patient has much more anxiety recently than previously.  She has previously been on zoloft, celexa, wellbutrin and buspar and couldn't tolerate these medications.  No SI/ HI.  She is having poor sleep and headaches.  We discussed ashwaghanda usage and she will ask her pharmacist if this is ok to take with her current medications.  We also discussed Serotonin like agonists like Trintellix and Viibryd as alternatives.

## 2024-05-19 NOTE — Progress Notes (Signed)
 SUBJECTIVE:  Chief Complaint: Obesity  Interim History: Last three weeks have been very difficult.  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  She is thinking she may be able to restart Ozempic  this week.  Her anxiety is quite high at this time. Her anxiety is still quite high.  She is still eating less than goal but she cannot tolerate much.  She is slowly working on increasing intake as tolerated.   Hector is here to discuss her progress with her obesity treatment plan. She is on the Category 3 Plan and states she is following her eating plan approximately 60 % of the time. She states she is not exercising 0 minutes 0 times per week.   OBJECTIVE:    05/19/2024   11:00 AM 05/13/2024    2:48 PM 05/13/2024   11:30 AM  Vitals with BMI  Height 5' 4    Weight 213 lbs    BMI 36.54    Systolic 110 130 886  Diastolic 78 85 65  Pulse 97 74 68    Visit Diagnoses: Problem List Items Addressed This Visit       Other   Obesity, Beginning BMI 42.7   Prediabetes   Anxiety and depression - Primary   Patient has much more anxiety recently than previously.  She has previously been on zoloft, celexa, wellbutrin and buspar and couldn't tolerate these medications.  No SI/ HI.  She is having poor sleep and headaches.  We discussed ashwaghanda usage and she will ask her pharmacist if this is ok to take with her current medications.  We also discussed Serotonin like agonists like Trintellix and Viibryd as alternatives.      Other Visit Diagnoses       BMI 36.0-36.9,adult           No data recorded       ASSESSMENT AND PLAN: Assessment & Plan Anxiety and depression Patient has much more anxiety recently than previously.  She has previously been on zoloft, celexa, wellbutrin and buspar and couldn't tolerate these medications.  No SI/ HI.  She is having poor sleep and headaches.  We discussed ashwaghanda  usage and she will ask her pharmacist if this is ok to take with her current medications.  We also discussed Serotonin like agonists like Trintellix and Viibryd as alternatives. Prediabetes Hasn't taken semaglutide  for a few weeks due to GI upset from gastroenteritis.  Thinking she is starting to feel well enough to start this week.  Will start at 0.25mg  dose again and re-evaluate tolerate at next appt. Obesity, Beginning BMI 42.7 Anthropometric Measurements Height: 5' 4 (1.626 m) Weight: 213 lb (96.6 kg) BMI (Calculated): 36.54 Weight at Last Visit: 226lb Weight Lost Since Last Visit: 13lb Weight Gained Since Last Visit: 0lb Starting Weight: 249lb Total Weight Loss (lbs): 36 lb (16.3 kg) Peak Weight: 256lb Body Composition  Body Fat %: 41.6 % Fat Mass (lbs): 88.6 lbs Muscle Mass (lbs): 118.4 lbs Total Body Water  (lbs): 79.4 lbs Visceral Fat Rating : 10 Other Clinical Data Fasting: No Labs: No Today's Visit #: 11 Starting Date: 08/13/23  BMI 36.0-36.9,adult    Diet: Kyeisha is currently in the action stage of change. As such, her goal is to continue with weight loss efforts and has agreed to the Category 3 Plan.   Exercise:  For substantial health benefits, adults should do at least 150 minutes (2  hours and 30 minutes) a week of moderate-intensity, or 75 minutes (1 hour and 15 minutes) a week of vigorous-intensity aerobic physical activity, or an equivalent combination of moderate- and vigorous-intensity aerobic activity. Aerobic activity should be performed in episodes of at least 10 minutes, and preferably, it should be spread throughout the week.  Behavior Modification:  We discussed the following Behavioral Modification Strategies today: increasing lean protein intake, decreasing simple carbohydrates, increasing vegetables, avoiding temptations, and planning for success.   Return in about 4 weeks (around 06/16/2024).   She was informed of the importance of frequent follow up  visits to maximize her success with intensive lifestyle modifications for her multiple health conditions.  Attestation Statements:   Reviewed by clinician on day of visit: allergies, medications, problem list, medical history, surgical history, family history, social history, and previous encounter notes.   Adelita Cho, MD

## 2024-05-20 ENCOUNTER — Encounter (INDEPENDENT_AMBULATORY_CARE_PROVIDER_SITE_OTHER): Payer: Self-pay | Admitting: Family Medicine

## 2024-05-29 NOTE — Assessment & Plan Note (Signed)
 Anthropometric Measurements Height: 5' 4 (1.626 m) Weight: 213 lb (96.6 kg) BMI (Calculated): 36.54 Weight at Last Visit: 226lb Weight Lost Since Last Visit: 13lb Weight Gained Since Last Visit: 0lb Starting Weight: 249lb Total Weight Loss (lbs): 36 lb (16.3 kg) Peak Weight: 256lb Body Composition  Body Fat %: 41.6 % Fat Mass (lbs): 88.6 lbs Muscle Mass (lbs): 118.4 lbs Total Body Water  (lbs): 79.4 lbs Visceral Fat Rating : 10 Other Clinical Data Fasting: No Labs: No Today's Visit #: 11 Starting Date: 08/13/23

## 2024-05-29 NOTE — Assessment & Plan Note (Signed)
 Hasn't taken semaglutide  for a few weeks due to GI upset from gastroenteritis.  Thinking she is starting to feel well enough to start this week.  Will start at 0.25mg  dose again and re-evaluate tolerate at next appt.

## 2024-06-14 ENCOUNTER — Encounter: Payer: Self-pay | Admitting: Family Medicine

## 2024-06-21 ENCOUNTER — Ambulatory Visit: Admitting: Cardiology

## 2024-06-21 ENCOUNTER — Ambulatory Visit: Admitting: Family Medicine

## 2024-06-22 ENCOUNTER — Ambulatory Visit: Admitting: Cardiology

## 2024-06-23 ENCOUNTER — Encounter (INDEPENDENT_AMBULATORY_CARE_PROVIDER_SITE_OTHER): Payer: Self-pay | Admitting: Family Medicine

## 2024-06-23 ENCOUNTER — Ambulatory Visit (INDEPENDENT_AMBULATORY_CARE_PROVIDER_SITE_OTHER): Admitting: Family Medicine

## 2024-06-23 VITALS — BP 108/76 | HR 104 | Temp 97.6°F | Ht 64.0 in | Wt 211.0 lb

## 2024-06-23 DIAGNOSIS — F419 Anxiety disorder, unspecified: Secondary | ICD-10-CM | POA: Diagnosis not present

## 2024-06-23 DIAGNOSIS — R7303 Prediabetes: Secondary | ICD-10-CM | POA: Diagnosis not present

## 2024-06-23 DIAGNOSIS — E669 Obesity, unspecified: Secondary | ICD-10-CM

## 2024-06-23 DIAGNOSIS — F32A Depression, unspecified: Secondary | ICD-10-CM

## 2024-06-23 DIAGNOSIS — Z6836 Body mass index (BMI) 36.0-36.9, adult: Secondary | ICD-10-CM

## 2024-06-23 NOTE — Progress Notes (Signed)
 SUBJECTIVE:  Chief Complaint: Obesity  Interim History: Patient has been dealing with sleepless nights every other night and significant anxiety.  She was tried on prozac but couldn't tolerate that.  She thinks her blood sugar is dropping significantly and by the time she thinks to check it she has already eaten.  She isn't walking as much as she was due to fatigue fro lack of sleep.  She is only taking in 0.25mg  of ozempic .  She is not interested in serotonin based treatment for her migraines.  Debbie [name] is here to discuss her progress with her obesity treatment plan. She is on the Category 3 Plan and states she is following her eating plan approximately 60 % of the time. She states she is exercising 20 minutes 3 times per week.   OBJECTIVE: Visit Diagnoses: Problem List Items Addressed This Visit   None   Vitals Temp: 97.6 F (36.4 C) BP: 108/76 Pulse Rate: (!) 104 SpO2: 97 %   Anthropometric Measurements Height: 5' 4 (1.626 m) Weight: 211 lb (95.7 kg) BMI (Calculated): 36.2 Weight at Last Visit: 213 Weight Lost Since Last Visit: 2lb Weight Gained Since Last Visit: 0 Starting Weight: 249lb Total Weight Loss (lbs): 38 lb (17.2 kg) Peak Weight: 256lb   Body Composition  Body Fat %: 42.6 % Fat Mass (lbs): 90.2 lbs Muscle Mass (lbs): 115.4 lbs Total Body Water  (lbs): 79.8 lbs Visceral Fat Rating : 10   Other Clinical Data Fasting: no Labs: no Today's Visit #: 12 Starting Date: 08/13/23 Comments: Cat 3     ASSESSMENT AND PLAN: Assessment & Plan Prediabetes Patient had previously been taking Ozempic  under prediabetes diagnosis but was paying for this out-of-pocket.  She reports she has not restarted Ozempic  since her gastroenteritis a few weeks ago.  She voiced significant anxiety about restarting giving the delicacy of what she is able to tolerate at this time. Anxiety and depression Patient reports reaching out to psychiatrist to discuss possibilities for  treatment of her anxiety and depression.  She would rather stay away from serotonin altering medications but is not sure what other available medications there would be for her anxiety.  She is interested in pursuing further therapy for anxiety and is going to start reaching out to her therapist to see what is available.  Will have to follow-up on medication changes if any at next appointment. Obesity (BMI 30-39.9), CURRENT BMI 38.77  BMI 36.0-36.9,adult    Diet: Arriona is currently in the action stage of change. As such, her goal is to continue with weight loss efforts and has agreed to the Category 3 Plan and focusing on eating protein and carbs 6 times a day to help get minimal nutritional requirements in.  Exercise:  For substantial health benefits, adults should do at least 150 minutes (2 hours and 30 minutes) a week of moderate-intensity, or 75 minutes (1 hour and 15 minutes) a week of vigorous-intensity aerobic physical activity, or an equivalent combination of moderate- and vigorous-intensity aerobic activity. Aerobic activity should be performed in episodes of at least 10 minutes, and preferably, it should be spread throughout the week.  Behavior Modification:  We discussed the following Behavioral Modification Strategies today: increasing lean protein intake, decreasing simple carbohydrates, increasing vegetables, no skipping meals, meal planning and cooking strategies, and keeping healthy foods in the home.   No follow-ups on file.   She was informed of the importance of frequent follow up visits to maximize her success with intensive lifestyle modifications for  her multiple health conditions.  Attestation Statements:   Reviewed by clinician on day of visit: allergies, medications, problem list, medical history, surgical history, family history, social history, and previous encounter notes.     Adelita Cho, MD

## 2024-06-30 NOTE — Assessment & Plan Note (Signed)
 Patient reports reaching out to psychiatrist to discuss possibilities for treatment of her anxiety and depression.  She would rather stay away from serotonin altering medications but is not sure what other available medications there would be for her anxiety.  She is interested in pursuing further therapy for anxiety and is going to start reaching out to her therapist to see what is available.  Will have to follow-up on medication changes if any at next appointment.

## 2024-06-30 NOTE — Assessment & Plan Note (Signed)
 Patient had previously been taking Ozempic  under prediabetes diagnosis but was paying for this out-of-pocket.  She reports she has not restarted Ozempic  since her gastroenteritis a few weeks ago.  She voiced significant anxiety about restarting giving the delicacy of what she is able to tolerate at this time.

## 2024-07-29 ENCOUNTER — Other Ambulatory Visit: Payer: Self-pay | Admitting: Family Medicine

## 2024-07-29 ENCOUNTER — Other Ambulatory Visit: Payer: Self-pay

## 2024-08-01 ENCOUNTER — Other Ambulatory Visit: Payer: Self-pay | Admitting: Family Medicine

## 2024-08-01 ENCOUNTER — Other Ambulatory Visit: Payer: Self-pay

## 2024-08-02 ENCOUNTER — Other Ambulatory Visit: Payer: Self-pay | Admitting: Pharmacy Technician

## 2024-08-02 ENCOUNTER — Ambulatory Visit (INDEPENDENT_AMBULATORY_CARE_PROVIDER_SITE_OTHER): Admitting: Family Medicine

## 2024-08-02 ENCOUNTER — Other Ambulatory Visit: Payer: Self-pay

## 2024-08-02 ENCOUNTER — Encounter (INDEPENDENT_AMBULATORY_CARE_PROVIDER_SITE_OTHER): Payer: Self-pay | Admitting: Family Medicine

## 2024-08-02 VITALS — BP 133/82 | HR 88 | Temp 98.2°F | Ht 64.0 in | Wt 217.0 lb

## 2024-08-02 DIAGNOSIS — F32A Depression, unspecified: Secondary | ICD-10-CM

## 2024-08-02 DIAGNOSIS — F419 Anxiety disorder, unspecified: Secondary | ICD-10-CM | POA: Diagnosis not present

## 2024-08-02 DIAGNOSIS — Z6837 Body mass index (BMI) 37.0-37.9, adult: Secondary | ICD-10-CM

## 2024-08-02 DIAGNOSIS — E669 Obesity, unspecified: Secondary | ICD-10-CM

## 2024-08-02 DIAGNOSIS — G4700 Insomnia, unspecified: Secondary | ICD-10-CM | POA: Diagnosis not present

## 2024-08-02 MED ORDER — BOTOX 200 UNITS IJ SOLR
155.0000 [IU] | INTRAMUSCULAR | 2 refills | Status: AC
  Filled 2024-08-02 (×2): qty 1, 90d supply, fill #0
  Filled 2024-10-24 – 2024-10-25 (×2): qty 1, 90d supply, fill #1

## 2024-08-02 MED ORDER — BOTOX 200 UNITS IJ SOLR: 155.0000 [IU] | INTRAMUSCULAR | 2 refills | Status: DC

## 2024-08-02 NOTE — Assessment & Plan Note (Signed)
 Patient voices that her anxiety is better controlled now that she is on gabapentin  and hydroxyzine.  She mentions that now that she I walking she is getting additional stress control and decrease in anxiety.  She is using an Surveyor, minerals.

## 2024-08-02 NOTE — Assessment & Plan Note (Addendum)
 PCP prescribing Ambien  for assistance in sleep.  She voices she doesn't feel her mind is racing but it is.  She is experiencing now a few good nights rest with ambien .  Patient to continue on current treatment plan and will follow-up on sleep quality and quantity at next appointment

## 2024-08-02 NOTE — Progress Notes (Signed)
 SUBJECTIVE:  Chief Complaint: Obesity  Interim History: Patient is able to get a significant amount of food in daily now.  She has not been able to sleep much .  She isn't able to eat the recommended amount and is eating what she can.  She is eating vegetables and fruits. She is able to eat a bit more and not have to eat until 10pm.  She is back to working out again.  June and July were very difficult due to her abdominal issues and sleep issues.  She is going to the gym now.   Debbie [name] is here to discuss her progress with her obesity treatment plan. She is on the Category 3 Plan and states she is following her eating plan approximately 70 % of the time. She states she is exercising 20-30 minutes 4 times per week.   OBJECTIVE: Visit Diagnoses: Problem List Items Addressed This Visit       Other   Insomnia   Obesity, Beginning BMI 42.7   Anxiety and depression - Primary   Relevant Medications   hydrOXYzine (ATARAX) 25 MG tablet   Other Visit Diagnoses       BMI 37.0-37.9, adult           Vitals Temp: 98.2 F (36.8 C) BP: 133/82 Pulse Rate: 88 SpO2: 97 %   Anthropometric Measurements Height: 5' 4 (1.626 m) Weight: 217 lb (98.4 kg) BMI (Calculated): 37.23 Weight at Last Visit: 211 lb Weight Lost Since Last Visit: 0 Weight Gained Since Last Visit: 6 lb Starting Weight: 249 lb Total Weight Loss (lbs): 32 lb (14.5 kg) Peak Weight: 256 lb   Body Composition  Body Fat %: 43.5 % Fat Mass (lbs): 94.8 lbs Muscle Mass (lbs): 116.8 lbs Total Body Water  (lbs): 82.2 lbs Visceral Fat Rating : 11   Other Clinical Data Today's Visit #: 13 Starting Date: 08/13/23 Comments: Cat 3     ASSESSMENT AND PLAN: Assessment & Plan Anxiety and depression Patient voices that her anxiety is better controlled now that she is on gabapentin  and hydroxyzine.  She mentions that now that she I walking she is getting additional stress control and decrease in anxiety.  She is using an  Surveyor, minerals. Insomnia, unspecified type PCP prescribing Ambien  for assistance in sleep.  She voices she doesn't feel her mind is racing but it is.  She is experiencing now a few good nights rest with ambien .  Patient to continue on current treatment plan and will follow-up on sleep quality and quantity at next appointment Obesity, Beginning BMI 42.7  BMI 37.0-37.9, adult    Diet: Tyshae is currently in the action stage of change. As such, her goal is to continue with weight loss efforts and has agreed to the Category 3 Plan or 1600 calories and 100 or more grams of protein daily.  Exercise:  For substantial health benefits, adults should do at least 150 minutes (2 hours and 30 minutes) a week of moderate-intensity, or 75 minutes (1 hour and 15 minutes) a week of vigorous-intensity aerobic physical activity, or an equivalent combination of moderate- and vigorous-intensity aerobic activity. Aerobic activity should be performed in episodes of at least 10 minutes, and preferably, it should be spread throughout the week.  Behavior Modification:  We discussed the following Behavioral Modification Strategies today: increasing lean protein intake, decreasing simple carbohydrates, increasing vegetables, meal planning and cooking strategies, keeping healthy foods in the home, avoiding temptations, and planning for success.   Return in about  6 weeks (around 09/13/2024).   She was informed of the importance of frequent follow up visits to maximize her success with intensive lifestyle modifications for her multiple health conditions.  Attestation Statements:   Reviewed by clinician on day of visit: allergies, medications, problem list, medical history, surgical history, family history, social history, and previous encounter notes.     Adelita Cho, MD

## 2024-08-02 NOTE — Telephone Encounter (Signed)
 Pt's appt is 08/08/24, please send new Botox  rx to [facility]. Thank you!

## 2024-08-02 NOTE — Telephone Encounter (Signed)
 Rx sent to [facility]. I sent the Rx to [facility] the first time and I called to cancel Rx.

## 2024-08-02 NOTE — Addendum Note (Signed)
 Addended by: DOUGLASS DELON CROME on: 08/02/2024 12:48 PM   Modules accepted: Orders

## 2024-08-02 NOTE — Progress Notes (Signed)
 Specialty Pharmacy Refill Coordination Note  Debbie [name] is a 43 y.o. female assessed today regarding refills of clinic administered specialty medication(s) OnabotulinumtoxinA  (Botox )   Clinic requested Courier to Provider Office   Delivery date: 08/03/24   Verified address: [facility] [address]   Medication will be filled on 08/02/24.  Injection Appointment 08/08/24.  Copay $0.
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
## 2024-08-08 ENCOUNTER — Encounter: Payer: Self-pay | Admitting: Family Medicine

## 2024-08-08 ENCOUNTER — Ambulatory Visit (INDEPENDENT_AMBULATORY_CARE_PROVIDER_SITE_OTHER): Admitting: Family Medicine

## 2024-08-08 DIAGNOSIS — G43709 Chronic migraine without aura, not intractable, without status migrainosus: Secondary | ICD-10-CM

## 2024-08-08 MED ORDER — ONABOTULINUMTOXINA 200 UNITS IJ SOLR
155.0000 [IU] | Freq: Once | INTRAMUSCULAR | Status: AC
  Administered 2024-08-08: 155 [IU] via INTRAMUSCULAR

## 2024-08-08 NOTE — Progress Notes (Signed)
 Botox - 200 units x 1 vial Lot: [id number] Expiration: 09/2026 NDC: 9976-6078-97  Bacteriostatic 0.9% Sodium Chloride - 4 mL  Lot: [id number] Expiration: 09/23/2025 NDC: 9590-8033-97  Dx: H56.290 S/P  Witnessed by Maurilio Molt, RN

## 2024-08-14 ENCOUNTER — Other Ambulatory Visit: Payer: Self-pay | Admitting: Neurology

## 2024-09-06 ENCOUNTER — Encounter: Payer: Self-pay | Admitting: *Deleted

## 2024-09-09 ENCOUNTER — Encounter: Payer: Self-pay | Admitting: Cardiology

## 2024-09-09 ENCOUNTER — Ambulatory Visit: Attending: Cardiology | Admitting: Cardiology

## 2024-09-09 VITALS — BP 128/85 | HR 85 | Ht 64.0 in | Wt 220.5 lb

## 2024-09-09 DIAGNOSIS — R002 Palpitations: Secondary | ICD-10-CM

## 2024-09-09 DIAGNOSIS — R072 Precordial pain: Secondary | ICD-10-CM | POA: Diagnosis not present

## 2024-09-09 DIAGNOSIS — E782 Mixed hyperlipidemia: Secondary | ICD-10-CM | POA: Diagnosis not present

## 2024-09-09 MED ORDER — METOPROLOL TARTRATE 100 MG PO TABS: 100.0000 mg | ORAL_TABLET | Freq: Once | ORAL | 0 refills | Status: DC

## 2024-09-09 NOTE — Progress Notes (Signed)
 Cardiology Office Note:    Date:  09/09/2024   ID:  Debbie [name], DOB 1981-03-12, MRN [id number]  PCP:  Elliot Charm, MD  Cardiologist:  De Jaworski, DO  Electrophysiologist:  None   Referring MD: Elliot Charm,*    I am having chest pain and sometime my watch reports atrial fibrillation   History of Present Illness:    Debbie [name] is a 43 y.o. female with a hx of Severe preeclampsia, Hypertriglyceridemia Obesity.  She experiences a squeezing sensation in the center of her chest, radiating up and down but not to her shoulders, without heartburn or shortness of breath. Her chest pain is intermittent, nothing makes it better or worse.   Palpitations occur with episodes of heart racing and hot flashes, with atrial fibrillation noted 2% of the time on her watch.   Her family history includes significant heart disease, with her grandfather having multiple heart attacks and her father dying from congestive heart failure. She discontinued rosuvastatin due to lightheadedness, with a total cholesterol of 129 and high triglycerides.  Past Medical History:  Diagnosis Date   ADD (attention deficit disorder)    Anxiety    self reported   Arthritis    in spine   Back pain    Chronic back pain 2021   low back/hip; had nerve ablation in lumbar/sacral joint   Constipation    Depression    self reported   Heartburn    High cholesterol    History of hiatal hernia    History of stomach ulcers    Hypertension    Joint pain    Maxillary sinus cyst    left   Menopause    Migraine    Multiple thyroid  nodules    3; 1.9 cm and 2 cm, 0.6 cm, will see general  surgeon   No pertinent past medical history    Ovarian cyst    Porphyria (HCC)    Shoulder pain    Swallowing difficulty    UTI (urinary tract infection)     Past Surgical History:  Procedure Laterality Date   BIOPSY  08/27/2022   Procedure: BIOPSY;  Surgeon: Burnette Fallow, MD;  Location: [facility];   Service: Gastroenterology;;   CESAREAN SECTION  2013   CESAREAN SECTION N/A 11/26/2021   Procedure: REPEAT CESAREAN SECTION;  Surgeon: Storm Setter, DO;  Location: [facility];  Service: Obstetrics;  Laterality: N/A;   ESOPHAGOGASTRODUODENOSCOPY N/A 08/27/2022   Procedure: ESOPHAGOGASTRODUODENOSCOPY (EGD);  Surgeon: Burnette Fallow, MD;  Location: [facility];  Service: Gastroenterology;  Laterality: N/A;   laparscopy  2012   ruptured ectopic   radiofrequency ablation of lumbar spine N/A 08/07/2020   ROBOTIC ASSISTED LAPAROSCOPIC HYSTERECTOMY AND SALPINGECTOMY Bilateral 02/04/2023   Procedure: XI ROBOTIC ASSISTED LAPAROSCOPIC HYSTERECTOMY AND BILATERAL SALPINGO-OOPHORECTOMY;  Surgeon: Rosalva Sawyer, MD;  Location: [facility];  Service: Gynecology;  Laterality: Bilateral;   ROBOTIC ASSISTED LAPAROSCOPIC LYSIS OF ADHESION  02/04/2023   Procedure: XI ROBOTIC ASSISTED LAPAROSCOPIC LYSIS OF ADHESION;  Surgeon: Rosalva Sawyer, MD;  Location: [facility];  Service: Gynecology;;   SHOULDER CAPSULORRHAPHY Right    TUBAL LIGATION     fallopian tubes removed   WISDOM TOOTH EXTRACTION      Current Medications: Current Meds  Medication Sig   botulinum toxin Type A  (BOTOX ) 200 units injection Inject 155 Units into the muscle every 3 (three) months. Inject 155units to head and neck intramuscularly every 3 months.   clindamycin  (CLINDACIN ETZ) 1 % SWAB 1 swab Externally  Twice a day; Duration: 90 days   Continuous Glucose Monitor Sup MISC 1 Units by Does not apply route every 30 (thirty) days.   gabapentin  (NEURONTIN ) 300 MG capsule Take 300 mg by mouth 3 (three) times daily as needed.   hydrOXYzine (ATARAX) 25 MG tablet Take 25 mg by mouth 2 (two) times daily.   ibuprofen  (ADVIL ) 600 MG tablet Take 1 tablet (600 mg total) by mouth every 6 (six) hours as needed.   Insulin  Pen Needle (BD PEN NEEDLE NANO 2ND GEN) 32G X 4 MM MISC 1 Package by Does not apply route 2 (two) times daily.   Magnesium  500 MG CAPS Take 500 mg by  mouth.   metoCLOPramide  (REGLAN ) 10 MG tablet Take 1 tablet (10 mg total) by mouth 3 (three) times daily with meals.   metoprolol tartrate (LOPRESSOR) 100 MG tablet Take 1 tablet (100 mg total) by mouth once for 1 dose. TAKE TWO HOURS PRIOR TO  SCHEDULE CARDIAC TEST   NURTEC 75 MG TBDP Take 75 mg by mouth.   pantoprazole  (PROTONIX ) 40 MG tablet Take 40 mg by mouth daily.   Semaglutide , 1 MG/DOSE, 4 MG/3ML SOPN Inject 1 mg as directed once a week.   tiZANidine  (ZANAFLEX ) 4 MG tablet TAKE 1 TABLET (4 MG TOTAL) BY MOUTH 2 (TWO) TIMES DAILY AS NEEDED FOR MUSCLE SPASMS.   traZODone (DESYREL) 50 MG tablet Take 50 mg by mouth at bedtime. 2 times per night   zolmitriptan  (ZOMIG ) 5 MG tablet TAKE 1 TAB BY MOUTH AS NEEDED FOR MIGRAINE. CAN REPEAT IN 2 OR MORE HOURS. MAX 2 DOSES IN ONE DAY.   zolpidem  (AMBIEN ) 5 MG tablet 5 mg.     Allergies:   Tylenol [acetaminophen], Aspartame, Aspirin, Benadryl [diphenhydramine hcl], Buspar [buspirone], Celexa [citalopram hydrobromide], Dilaudid  [hydromorphone  hcl], Effexor  xr [venlafaxine ], Morphine , Penicillins, Prednisone, Stevia glycerite extract [flavoring agent (non-screening)], Sucralose, Sulfamethoxazole-trimethoprim, Zoloft [sertraline], and Latex   Social History   Socioeconomic History   Marital status: Married    Spouse name: Not on file   Number of children: 1   Years of education: Not on file   Highest education level: Bachelor's degree (e.g., BA, AB, BS)  Occupational History   Occupation: Arts administrator  Tobacco Use   Smoking status: Never   Smokeless tobacco: Never  Vaping Use   Vaping status: Never Used  Substance and Sexual Activity   Alcohol use: Not Currently   Drug use: No   Sexual activity: Not Currently    Birth control/protection: None  Other Topics Concern   Not on file  Social History Narrative   Lives at home with her sons & his father   Right handed   Drinks 1 cup of caffeine daily   Social Drivers of Health   Financial  Resource Strain: Patient Declined (11/30/2023)   [facility]   Overall Financial Resource Strain (CARDIA)    Difficulty of Paying Living Expenses: Patient declined  Food Insecurity: Patient Declined (11/30/2023)   Received from [facility]   Hunger Vital Sign    Within the past 12 months, you worried that your food would run out before you got the money to buy more.: Patient declined    Within the past 12 months, the food you bought just didn't last and you didn't have money to get more.: Patient declined  Transportation Needs: Patient Declined (11/30/2023)   Received from [facility] - Transportation  In the past 12 months, has lack of transportation kept you from medical appointments or from getting medications?: Patient declined    Lack of Transportation (Non-Medical): Patient declined  Physical Activity: Not on file  Stress: Not on file  Social Connections: Not on file     Family History: The patient's family history includes Cerebral aneurysm in her sister; Dementia in her father; Diabetes in her father; Heart disease in her father, maternal grandfather, and another family member; High Cholesterol in an other family member; Hyperlipidemia in her mother; Hypertension in her mother; Migraines in her sister; Seizures in her sister; Sleep apnea in her mother.  ROS:   Review of Systems  Constitution: Negative for decreased appetite, fever and weight gain.  HENT: Negative for congestion, ear discharge, hoarse voice and sore throat.   Eyes: Negative for discharge, redness, vision loss in right eye and visual halos.  Cardiovascular: Negative for chest pain, dyspnea on exertion, leg swelling, orthopnea and palpitations.  Respiratory: Negative for cough, hemoptysis, shortness of breath and snoring.   Endocrine: Negative for heat intolerance and polyphagia.  Hematologic/Lymphatic: Negative for bleeding problem. Does not  bruise/bleed easily.  Skin: Negative for flushing, nail changes, rash and suspicious lesions.  Musculoskeletal: Negative for arthritis, joint pain, muscle cramps, myalgias, neck pain and stiffness.  Gastrointestinal: Negative for abdominal pain, bowel incontinence, diarrhea and excessive appetite.  Genitourinary: Negative for decreased libido, genital sores and incomplete emptying.  Neurological: Negative for brief paralysis, focal weakness, headaches and loss of balance.  Psychiatric/Behavioral: Negative for altered mental status, depression and suicidal ideas.  Allergic/Immunologic: Negative for HIV exposure and persistent infections.    EKGs/Labs/Other Studies Reviewed:    The following studies were reviewed today:   EKG:  none   Recent Labs: 05/13/2024: ALT 23; BUN 9; Creatinine, Ser 1.09; Hemoglobin 15.1; Platelets 318; Potassium 4.5; Sodium 136  Recent Lipid Panel    Component Value Date/Time   CHOL 202 (H) 04/12/2024 0849   TRIG 216 (H) 04/12/2024 0849   HDL 34 (L) 04/12/2024 0849   CHOLHDL 5.9 (H) 04/12/2024 0849   LDLCALC 129 (H) 04/12/2024 0849    Physical Exam:    VS:  BP 128/85 (BP Location: Left Arm, Patient Position: Sitting, Cuff Size: Normal)   Pulse 85   Ht 5' 4 (1.626 m)   Wt 220 lb 8 oz (100 kg)   LMP 08/20/2022 (Exact Date) Comment: currently menstruating  SpO2 98%   BMI 37.85 kg/m     Wt Readings from Last 3 Encounters:  09/09/24 220 lb 8 oz (100 kg)  08/02/24 217 lb (98.4 kg)  06/23/24 211 lb (95.7 kg)     GEN: Well nourished, well developed in no acute distress HEENT: Normal NECK: No JVD; No carotid bruits LYMPHATICS: No lymphadenopathy CARDIAC: S1S2 noted,RRR, no murmurs, rubs, gallops RESPIRATORY:  Clear to auscultation without rales, wheezing or rhonchi  ABDOMEN: Soft, non-tender, non-distended, +bowel sounds, no guarding. EXTREMITIES: No edema, No cyanosis, no clubbing MUSCULOSKELETAL:  No deformity  SKIN: Warm and dry NEUROLOGIC:   Alert and oriented x 3, non-focal PSYCHIATRIC:  Normal affect, good insight  ASSESSMENT:    1. Precordial pain   2. Palpitations   3. Mixed hyperlipidemia   4. Morbid obesity (HCC)    PLAN:    Chest pain  -  Intermittent squeezing chest pain with radiation, no heartburn or dyspnea. Family history of early-onset heart disease.  The symptoms chest pain is concerning, this patient does have intermediate risk for coronary  artery disease and at this time I would like to pursue an ischemic evaluation in this patient.  Shared decision a coronary CTA at this time is appropriate.  I have discussed with the patient about the testing.  The patient has no IV contrast allergy and is agreeable to proceed with this test.    Palpitations with possible paroxysmal atrial fibrillation Palpitations with smartwatch indicating atrial fibrillation 2% of the time.  She will benefit from wearing a ambulatory monitor, so I will  0rder 30-day cardiac monitor  to be worn after coronary CT scan.  Hypertriglyceridemia - Triglycerides above target. Family history of heart disease. Previous rosuvastatin intolerance. Lipoprotein A levels to assess genetic risk. Discussed cholesterol management to prevent plaque buildup. - Order lipoprotein A test. - Consider atorvastatin if blood work still shows elevate LDL. - Repeat blood work today.  The patient is in agreement with the above plan. The patient left the office in stable condition.  The patient will follow up in   Medication Adjustments/Labs and Tests Ordered: Current medicines are reviewed at length with the patient today.  Concerns regarding medicines are outlined above.  Orders Placed This Encounter  Procedures   CT CORONARY MORPH W/CTA COR W/SCORE W/CA W/CM &/OR WO/CM   Basic metabolic panel with GFR   Lipid panel   Lipoprotein A (LPA)   CARDIAC EVENT MONITOR   Meds ordered this encounter  Medications   metoprolol tartrate (LOPRESSOR) 100 MG tablet    Sig:  Take 1 tablet (100 mg total) by mouth once for 1 dose. TAKE TWO HOURS PRIOR TO  SCHEDULE CARDIAC TEST    Dispense:  1 tablet    Refill:  0    Patient Instructions  Medication Instructions:   One time dose Metoprolol tartrate 100 mg  2 hour before CCTA *If you need a refill on your cardiac medications before your next appointment, please call your pharmacy*   Lab Work: BMP LIPID LP(a) If you have labs (blood work) drawn today and your tests are completely normal, you will receive your results only by: MyChart Message (if you have MyChart) OR A paper copy in the mail If you have any lab test that is abnormal or we need to change your treatment, we will call you to review the results.   Testing/Procedures: 1)Your physician has recommended that you wear an event monitor 30 day . Event monitors are medical devices that record the heart's electrical activity. Doctors most often us  these monitors to diagnose arrhythmias. Arrhythmias are problems with the speed or rhythm of the heartbeat. The monitor is a small, portable device. You can wear one while you do your normal daily activities. This is usually used to diagnose what is causing palpitations/syncope (passing out).    2) Your physician has requested that you have coronary  CTA. Coronary computed tomography (CT)angiogram  is a special type of CT scan that uses a computer to produce multi-dimensional views of major blood vessels throughout the heart.  CT angiography, a contrast material is injected through an IV to help visualize the blood vessels  a painless test that uses an x-ray machine to take clear, detailed pictures of your heart arteries .  Please follow instruction sheet as given.  Follow-Up: At [facility], you and your health needs are our priority.  As part of our continuing mission to provide you with exceptional heart care, we have created designated Provider Care Teams.  These Care Teams include your primary Cardiologist  (physician) and  Advanced Practice Providers (APPs -  Physician Assistants and Nurse Practitioners) who all work together to provide you with the care you need, when you need it.     Your next appointment:   6 month(s)  The format for your next appointment:   In Person  Provider:   Audrina Marten, DO   Other Instructions  [employer] Cardiac Event Monitor Instructions  Your physician has requested you wear your cardiac event monitor for __30___ days, (1-30). [employer] may call or text to confirm a shipping address. The monitor will be sent to a land address via UPS. [employer] will not ship a monitor to a PO BOX. It typically takes 3-5 days to receive your monitor after it has been enrolled. [employer] will assist with USPS tracking if your package is delayed. The telephone number for [employer] is [phone]. Once you have received your monitor, please review the enclosed instructions. Instruction tutorials can also be viewed under help and settings on the enclosed cell phone. Your monitor has already been registered assigning a specific monitor serial # to you.  Billing and Self Pay Discount Information  [employer] has been provided the insurance information we had on file for you.  If your insurance has been updated, please call [employer] at [phone] to provide them with your updated insurance information.   [employer] offers a discounted Self Pay option for patients who have insurance that does not cover their cardiac event monitor or patients without insurance.  The discounted cost of a Self Pay Cardiac Event Monitor would be $225.00 , if the patient contacts [employer] at [phone] within 7 days of applying the monitor to make payment arrangements.  If the patient does not contact [employer] within 7 days of applying the monitor, the cost of the cardiac event monitor will be $350.00.  Applying the monitor  Remove cell phone from case and turn it on. The cell phone works  as IT consultant and needs to be within [employer] of you at all times. The cell phone will need to be charged on a daily basis. We recommend you plug the cell phone into the enclosed charger at your bedside table every night.  Monitor batteries: You will receive two monitor batteries labelled #1 and #2. These are your recorders. Plug battery #2 onto the second connection on the enclosed charger. Keep one battery on the charger at all times. This will keep the monitor battery deactivated. It will also keep it fully charged for when you need to switch your monitor batteries. A small light will be blinking on the battery emblem when it is charging. The light on the battery emblem will remain on when the battery is fully charged.  Open package of a Monitor strip. Insert battery #1 into black hood on strip and gently squeeze monitor battery onto connection as indicated in instruction booklet. Set aside while preparing skin.  Choose location for your strip, vertical or horizontal, as indicated in the instruction booklet. Shave to remove all hair from location. There cannot be any lotions, oils, powders, or colognes on skin where monitor is to be applied. Wipe skin clean with enclosed Saline wipe. Dry skin completely.  Peel paper labeled #1 off the back of the Monitor strip exposing the adhesive. Place the monitor on the chest in the vertical or horizontal position shown in the instruction booklet. One arrow on the monitor strip must be pointing upward. Carefully remove paper labeled #2, attaching remainder of strip to your skin. Try not to create  any folds or wrinkles in the strip as you apply it.  Firmly press and release the circle in the center of the monitor battery. You will hear a small beep. This is turning the monitor battery on. The heart emblem on the monitor battery will light up every 5 seconds if the monitor battery in turned on and connected to the patient securely. Do not push and  hold the circle down as this turns the monitor battery off. The cell phone will locate the monitor battery. A screen will appear on the cell phone checking the connection of your monitor strip. This may read poor connection initially but change to good connection within the next minute. Once your monitor accepts the connection you will hear a series of 3 beeps followed by a climbing crescendo of beeps. A screen will appear on the cell phone showing the two monitor strip placement options. Touch the picture that demonstrates where you applied the monitor strip.  Your monitor strip and battery are waterproof. You are able to shower, bathe, or swim with the monitor on. They just ask you do not submerge deeper than 3 feet underwater. We recommend removing the monitor if you are swimming in a lake, river, or ocean.  Your monitor battery will need to be switched to a fully charged monitor battery approximately once a week. The cell phone will alert you of an action which needs to be made.  On the cell phone, tap for details to reveal connection status, monitor battery status, and cell phone battery status. The green dots indicates your monitor is in good status. A red dot indicates there is something that needs your attention.  To record a symptom, click the circle on the monitor battery. In 30-60 seconds a list of symptoms will appear on the cell phone. Select your symptom and tap save. Your monitor will record a sustained or significant arrhythmia regardless of you clicking the button. Some patients do not feel the heart rhythm irregularities. [employer] will notify us  of any serious or critical events.  Refer to instruction booklet for instructions on switching batteries, changing strips, the Do not disturb or Pause features, or any additional questions.  Call [employer] at [phone], to confirm your monitor is transmitting and record your baseline. They will answer any questions you may  have regarding the monitor instructions at that time.  Returning the monitor to [employer]  Place all equipment back into blue box. Peel off strip of paper to expose adhesive and close box securely. There is a prepaid UPS shipping label on this box. Drop in a UPS drop box, or at a UPS facility like [employer]. You may also contact [employer] to arrange UPS to pick up monitor package at your home.               Your cardiac CT will be scheduled at one of the below locations:       [facility] [address]  [place], [place] [address] [phone]    If scheduled at the Heart and Vascular Tower at [facility] street, please enter the parking lot using the [address] entrance and use the FREE valet service at the patient drop-off area. Enter the building and check-in with registration on the main floor.    Please follow these instructions carefully (unless otherwise directed):  An IV will be required for this test and Nitroglycerin will be given.    On the Night Before the Test: Be sure  to Drink plenty of water . Do not consume any caffeinated/decaffeinated beverages or chocolate 12 hours prior to your test. Do not take any antihistamines 12 hours prior to your test.    On the Day of the Test: Drink plenty of water  until 1 hour prior to the test. Do not eat any food 1 hour prior to test. You may take your regular medications prior to the test.  Take metoprolol (Lopressor)  100 mg two hours prior to test. Patients who wear a continuous glucose monitor MUST remove the device prior to scanning. FEMALES- please wear underwire-free bra if available, avoid dresses & tight clothing  After the Test: Drink plenty of water . After receiving IV contrast, you may experience a mild flushed feeling. This is normal. On occasion, you may experience a mild rash up to 24 hours after the test. This is not dangerous. If this occurs, you can take  Benadryl 25 mg, Zyrtec, Claritin , or Allegra and increase your fluid intake. (Patients taking Tikosyn should avoid Benadryl, and may take Zyrtec, Claritin , or Allegra) If you experience trouble breathing, this can be serious. If it is severe call 911 IMMEDIATELY. If it is mild, please call our office.  We will call to schedule your test 2-4 weeks out understanding that some insurance companies will need an authorization prior to the service being performed.   For more information and frequently asked questions, please visit our website : [email or web address]  For non-scheduling related questions, please contact the cardiac imaging nurse navigator should you have any questions/concerns: Cardiac Imaging Nurse Navigators Direct Office Dial: [phone]   For scheduling needs, including cancellations and rescheduling, please call [place], [phone].    Adopting a Healthy Lifestyle.  Know what a healthy weight is for you (roughly BMI <25) and aim to maintain this   Aim for 7+ servings of fruits and vegetables daily   65-80+ fluid ounces of water  or unsweet tea for healthy kidneys   Limit to max 1 drink of alcohol per day; avoid smoking/tobacco   Limit animal fats in diet for cholesterol and heart health - choose grass fed whenever available   Avoid highly processed foods, and foods high in saturated/trans fats   Aim for low stress - take time to unwind and care for your mental health   Aim for 150 min of moderate intensity exercise weekly for heart health, and weights twice weekly for bone health   Aim for 7-9 hours of sleep daily   When it comes to diets, agreement about the perfect plan isnt easy to find, even among the experts. Experts at the [facility] [employer] developed an idea known as the Healthy Eating Plate. Just imagine a plate divided into logical, healthy portions.   The emphasis is on diet quality:   Load up on vegetables and fruits -  one-half of your plate: Aim for color and variety, and remember that potatoes dont count.   Go for whole grains - one-quarter of your plate: Whole wheat, barley, wheat berries, quinoa, oats, brown rice, and foods made with them. If you want pasta, go with whole wheat pasta.   Protein power - one-quarter of your plate: Fish, chicken, beans, and nuts are all healthy, versatile protein sources. Limit red meat.   The diet, however, does go beyond the plate, offering a few other suggestions.   Use healthy plant oils, such as olive, canola, soy, corn, sunflower and peanut. Check the labels, and avoid partially hydrogenated oil, which have unhealthy trans  fats.   If youre thirsty, drink water . Coffee and tea are good in moderation, but skip sugary drinks and limit milk and dairy products to one or two daily servings.   The type of carbohydrate in the diet is more important than the amount. Some sources of carbohydrates, such as vegetables, fruits, whole grains, and beans-are healthier than others.   Finally, stay active  Signed, Dub Huntsman, DO  09/09/2024 3:11 PM    [facility]

## 2024-09-09 NOTE — Patient Instructions (Addendum)
 Medication Instructions:   One time dose Metoprolol tartrate 100 mg  2 hour before CCTA *If you need a refill on your cardiac medications before your next appointment, please call your pharmacy*   Lab Work: BMP LIPID LP(a) If you have labs (blood work) drawn today and your tests are completely normal, you will receive your results only by: MyChart Message (if you have MyChart) OR A paper copy in the mail If you have any lab test that is abnormal or we need to change your treatment, we will call you to review the results.   Testing/Procedures: 1)Your physician has recommended that you wear an event monitor 30 day . Event monitors are medical devices that record the heart's electrical activity. Doctors most often us  these monitors to diagnose arrhythmias. Arrhythmias are problems with the speed or rhythm of the heartbeat. The monitor is a small, portable device. You can wear one while you do your normal daily activities. This is usually used to diagnose what is causing palpitations/syncope (passing out).    2) Your physician has requested that you have coronary  CTA. Coronary computed tomography (CT)angiogram  is a special type of CT scan that uses a computer to produce multi-dimensional views of major blood vessels throughout the heart.  CT angiography, a contrast material is injected through an IV to help visualize the blood vessels  a painless test that uses an x-ray machine to take clear, detailed pictures of your heart arteries .  Please follow instruction sheet as given.  Follow-Up: At [facility], you and your health needs are our priority.  As part of our continuing mission to provide you with exceptional heart care, we have created designated Provider Care Teams.  These Care Teams include your primary Cardiologist (physician) and Advanced Practice Providers (APPs -  Physician Assistants and Nurse Practitioners) who all work together to provide you with the care you need, when you need  it.     Your next appointment:   6 month(s)  The format for your next appointment:   In Person  Provider:   Kardie Tobb, DO   Other Instructions  [employer] Cardiac Event Monitor Instructions  Your physician has requested you wear your cardiac event monitor for __30___ days, (1-30). [employer] may call or text to confirm a shipping address. The monitor will be sent to a land address via UPS. [employer] will not ship a monitor to a PO BOX. It typically takes 3-5 days to receive your monitor after it has been enrolled. [employer] will assist with USPS tracking if your package is delayed. The telephone number for [employer] is [phone]. Once you have received your monitor, please review the enclosed instructions. Instruction tutorials can also be viewed under help and settings on the enclosed cell phone. Your monitor has already been registered assigning a specific monitor serial # to you.  Billing and Self Pay Discount Information  [employer] has been provided the insurance information we had on file for you.  If your insurance has been updated, please call [employer] at [phone] to provide them with your updated insurance information.   [employer] offers a discounted Self Pay option for patients who have insurance that does not cover their cardiac event monitor or patients without insurance.  The discounted cost of a Self Pay Cardiac Event Monitor would be $225.00 , if the patient contacts [employer] at [phone] within 7 days of applying the monitor to make payment arrangements.  If the patient does not contact [employer] within 7 days of  applying the monitor, the cost of the cardiac event monitor will be $350.00.  Applying the monitor  Remove cell phone from case and turn it on. The cell phone works as IT consultant and needs to be within [employer] of you at all times. The cell phone will need to be charged on a daily basis. We recommend you plug the cell phone into  the enclosed charger at your bedside table every night.  Monitor batteries: You will receive two monitor batteries labelled #1 and #2. These are your recorders. Plug battery #2 onto the second connection on the enclosed charger. Keep one battery on the charger at all times. This will keep the monitor battery deactivated. It will also keep it fully charged for when you need to switch your monitor batteries. A small light will be blinking on the battery emblem when it is charging. The light on the battery emblem will remain on when the battery is fully charged.  Open package of a Monitor strip. Insert battery #1 into black hood on strip and gently squeeze monitor battery onto connection as indicated in instruction booklet. Set aside while preparing skin.  Choose location for your strip, vertical or horizontal, as indicated in the instruction booklet. Shave to remove all hair from location. There cannot be any lotions, oils, powders, or colognes on skin where monitor is to be applied. Wipe skin clean with enclosed Saline wipe. Dry skin completely.  Peel paper labeled #1 off the back of the Monitor strip exposing the adhesive. Place the monitor on the chest in the vertical or horizontal position shown in the instruction booklet. One arrow on the monitor strip must be pointing upward. Carefully remove paper labeled #2, attaching remainder of strip to your skin. Try not to create any folds or wrinkles in the strip as you apply it.  Firmly press and release the circle in the center of the monitor battery. You will hear a small beep. This is turning the monitor battery on. The heart emblem on the monitor battery will light up every 5 seconds if the monitor battery in turned on and connected to the patient securely. Do not push and hold the circle down as this turns the monitor battery off. The cell phone will locate the monitor battery. A screen will appear on the cell phone checking the connection  of your monitor strip. This may read poor connection initially but change to good connection within the next minute. Once your monitor accepts the connection you will hear a series of 3 beeps followed by a climbing crescendo of beeps. A screen will appear on the cell phone showing the two monitor strip placement options. Touch the picture that demonstrates where you applied the monitor strip.  Your monitor strip and battery are waterproof. You are able to shower, bathe, or swim with the monitor on. They just ask you do not submerge deeper than 3 feet underwater. We recommend removing the monitor if you are swimming in a lake, river, or ocean.  Your monitor battery will need to be switched to a fully charged monitor battery approximately once a week. The cell phone will alert you of an action which needs to be made.  On the cell phone, tap for details to reveal connection status, monitor battery status, and cell phone battery status. The green dots indicates your monitor is in good status. A red dot indicates there is something that needs your attention.  To record a symptom, click the circle on the  monitor battery. In 30-60 seconds a list of symptoms will appear on the cell phone. Select your symptom and tap save. Your monitor will record a sustained or significant arrhythmia regardless of you clicking the button. Some patients do not feel the heart rhythm irregularities. [employer] will notify us  of any serious or critical events.  Refer to instruction booklet for instructions on switching batteries, changing strips, the Do not disturb or Pause features, or any additional questions.  Call [employer] at [phone], to confirm your monitor is transmitting and record your baseline. They will answer any questions you may have regarding the monitor instructions at that time.  Returning the monitor to [employer]  Place all equipment back into blue box. Peel off strip of paper to expose  adhesive and close box securely. There is a prepaid UPS shipping label on this box. Drop in a UPS drop box, or at a UPS facility like [employer]. You may also contact [employer] to arrange UPS to pick up monitor package at your home.               Your cardiac CT will be scheduled at one of the below locations:       [facility] [address]  [place], [place] [address] [phone]    If scheduled at the [facility] V[facility] at [facility] street, please enter the parking lot using the [address] entrance and use the FREE valet service at the patient drop-off area. Enter the building and check-in with registration on the main floor.    Please follow these instructions carefully (unless otherwise directed):  An IV will be required for this test and Nitroglycerin will be given.    On the Night Before the Test: Be sure to Drink plenty of water . Do not consume any caffeinated/decaffeinated beverages or chocolate 12 hours prior to your test. Do not take any antihistamines 12 hours prior to your test.    On the Day of the Test: Drink plenty of water  until 1 hour prior to the test. Do not eat any food 1 hour prior to test. You may take your regular medications prior to the test.  Take metoprolol (Lopressor)  100 mg two hours prior to test. Patients who wear a continuous glucose monitor MUST remove the device prior to scanning. FEMALES- please wear underwire-free bra if available, avoid dresses & tight clothing  After the Test: Drink plenty of water . After receiving IV contrast, you may experience a mild flushed feeling. This is normal. On occasion, you may experience a mild rash up to 24 hours after the test. This is not dangerous. If this occurs, you can take Benadryl 25 mg, Zyrtec, Claritin , or Allegra and increase your fluid intake. (Patients taking Tikosyn should avoid Benadryl, and may take Zyrtec, Claritin , or Allegra) If you  experience trouble breathing, this can be serious. If it is severe call 911 IMMEDIATELY. If it is mild, please call our office.  We will call to schedule your test 2-4 weeks out understanding that some insurance companies will need an authorization prior to the service being performed.   For more information and frequently asked questions, please visit our website : [email or web address]  For non-scheduling related questions, please contact the cardiac imaging nurse navigator should you have any questions/concerns: Cardiac Imaging Nurse Navigators Direct Office Dial: [phone]   For scheduling needs, including cancellations and rescheduling, please call [place], [phone].

## 2024-09-11 LAB — BASIC METABOLIC PANEL WITH GFR
BUN/Creatinine Ratio: 12 (ref 9–23)
BUN: 13 mg/dL (ref 6–24)
CO2: 22 mmol/L (ref 20–29)
Calcium: 10.1 mg/dL (ref 8.7–10.2)
Chloride: 100 mmol/L (ref 96–106)
Creatinine, Ser: 1.05 mg/dL — ABNORMAL HIGH (ref 0.57–1.00)
Glucose: 88 mg/dL (ref 70–99)
Potassium: 4.7 mmol/L (ref 3.5–5.2)
Sodium: 139 mmol/L (ref 134–144)
eGFR: 68 mL/min/1.73 (ref >59)

## 2024-09-11 LAB — LIPID PANEL
Chol/HDL Ratio: 7.1 ratio — ABNORMAL HIGH (ref 0.0–4.4)
Cholesterol, Total: 264 mg/dL — ABNORMAL HIGH (ref 100–199)
HDL: 37 mg/dL — ABNORMAL LOW (ref >39)
LDL Chol Calc (NIH): 178 mg/dL — ABNORMAL HIGH (ref 0–99)
Triglycerides: 256 mg/dL — ABNORMAL HIGH (ref 0–149)
VLDL Cholesterol Cal: 49 mg/dL — ABNORMAL HIGH (ref 5–40)

## 2024-09-11 LAB — LIPOPROTEIN A (LPA): Lipoprotein (a): 222.5 nmol/L — AB (ref <75.0)

## 2024-09-13 ENCOUNTER — Ambulatory Visit (INDEPENDENT_AMBULATORY_CARE_PROVIDER_SITE_OTHER): Admitting: Family Medicine

## 2024-09-13 ENCOUNTER — Encounter (INDEPENDENT_AMBULATORY_CARE_PROVIDER_SITE_OTHER): Payer: Self-pay | Admitting: Family Medicine

## 2024-09-13 ENCOUNTER — Ambulatory Visit: Payer: Self-pay | Admitting: Cardiology

## 2024-09-13 VITALS — BP 99/76 | HR 97 | Temp 98.0°F | Ht 64.0 in | Wt 216.0 lb

## 2024-09-13 DIAGNOSIS — E7849 Other hyperlipidemia: Secondary | ICD-10-CM | POA: Diagnosis not present

## 2024-09-13 DIAGNOSIS — E669 Obesity, unspecified: Secondary | ICD-10-CM

## 2024-09-13 DIAGNOSIS — Z6837 Body mass index (BMI) 37.0-37.9, adult: Secondary | ICD-10-CM

## 2024-09-13 DIAGNOSIS — E782 Mixed hyperlipidemia: Secondary | ICD-10-CM

## 2024-09-13 MED ORDER — ATORVASTATIN CALCIUM 10 MG PO TABS: 10.0000 mg | ORAL_TABLET | Freq: Every day | ORAL | Status: DC

## 2024-09-13 NOTE — Progress Notes (Signed)
 SUBJECTIVE:  Chief Complaint: Obesity  Interim History: Patient has had a decent last six weeks.  Her abdominal pain, symptoms and nausea has become manageable.  She has gotten to the point that her weight is maintaining with what she is doing.  For intake she is eating around 1600-1700 calories- oatmeal with PB in the am, yogurt, turkey sandwich with PB&J.  She is otherwise doing fine and isn't overeating and doesn't feel overly stuffed.  She is is getting around 60-70 grams of protein daily.  She is not sure what she can dot to keep calories consistent while increasing her protein.  She has a multipurpose protein that is hard to find but helps add protein.  She has an upcoming CTA scheduled for 10/29.  She is doing light weight and has a small pilates type machine to get some exercise in when she can.   Debbie [name] is here to discuss her progress with her obesity treatment plan. She is on the Category 3 Plan and states she is following her eating plan approximately 70 % of the time. She states she is exercising 20-30 minutes 4-7 times per week.   OBJECTIVE: Visit Diagnoses: Problem List Items Addressed This Visit       Other   Hyperlipidemia - Primary   Relevant Medications   atorvastatin (LIPITOR) 10 MG tablet   Obesity, Beginning BMI 42.7   Other Visit Diagnoses       BMI 37.0-37.9, adult           Vitals Temp: 98 F (36.7 C) BP: 99/76 Pulse Rate: 97 SpO2: 95 %   Anthropometric Measurements Height: 5' 4 (1.626 m) Weight: 216 lb (98 kg) BMI (Calculated): 37.06 Weight at Last Visit: 1 Weight Lost Since Last Visit: 0 Starting Weight: 249 lb Total Weight Loss (lbs): 33 lb (15 kg)   Body Composition  Body Fat %: 42.7 % Fat Mass (lbs): 92.4 lbs Muscle Mass (lbs): 118 lbs Total Body Water  (lbs): 81.8 lbs Visceral Fat Rating : 11   Other Clinical Data Today's Visit #: 14 Starting Date: 08/13/23 Comments: Cat 3     ASSESSMENT AND PLAN: Assessment & Plan Other  hyperlipidemia The 10-year ASCVD risk score (Arnett DK, et al., 2019) is: 1.9%   Values used to calculate the score:     Age: 28 years     Clincally relevant sex: Female     Is Non-Hispanic African American: No     Diabetic: No     Tobacco smoker: No     Systolic Blood Pressure: 116 mmHg     Is BP treated: No     HDL Cholesterol: 37 mg/dL     Total Cholesterol: 264 mg/dL  89-bzjm risk as above.  Had lengthy discussion with patient that elevation of LDL to this extent is likely family hypercholesterolemia.  Discussed starting low-dose statin and reevaluating cholesterol in 3 months.  May also benefit from referral to cardiology's lipid clinic for further evaluation of genetic factors and medication management. Obesity, Beginning BMI 42.7  BMI 37.0-37.9, adult    Diet: Shabana is currently in the action stage of change. As such, her goal is to continue with weight loss efforts and has agreed to the Category 3 Plan.   Exercise:  For substantial health benefits, adults should do at least 150 minutes (2 hours and 30 minutes) a week of moderate-intensity, or 75 minutes (1 hour and 15 minutes) a week of vigorous-intensity aerobic physical activity, or an equivalent combination of  moderate- and vigorous-intensity aerobic activity. Aerobic activity should be performed in episodes of at least 10 minutes, and preferably, it should be spread throughout the week.  Behavior Modification:  We discussed the following Behavioral Modification Strategies today: increasing lean protein intake, decreasing simple carbohydrates, increasing vegetables, meal planning and cooking strategies, and planning for success. We discussed various medication options to help Natahlia with her weight loss efforts and we both agreed to continue semaglutide  at current dosing interval..  Return in about 5 weeks (around 10/18/2024) for fasting labs and IC.   She was informed of the importance of frequent follow up visits to maximize  her success with intensive lifestyle modifications for her multiple health conditions.  Attestation Statements:   Reviewed by clinician on day of visit: allergies, medications, problem list, medical history, surgical history, family history, social history, and previous encounter notes.    Adelita Cho, MD

## 2024-09-14 ENCOUNTER — Telehealth: Payer: Self-pay | Admitting: Family Medicine

## 2024-09-14 NOTE — Telephone Encounter (Signed)
 Received fax of PA continuation form, completed and faxed with OV notes to [phone].

## 2024-09-15 NOTE — Telephone Encounter (Signed)
 Received fax of approval, she will continue to fill through [facility].  Auth#: [id number] (09/14/24-09/14/25)

## 2024-09-20 ENCOUNTER — Telehealth (HOSPITAL_COMMUNITY): Payer: Self-pay | Admitting: *Deleted

## 2024-09-20 NOTE — Telephone Encounter (Signed)

## 2024-09-21 ENCOUNTER — Ambulatory Visit (HOSPITAL_COMMUNITY)
Admission: RE | Admit: 2024-09-21 | Discharge: 2024-09-21 | Disposition: A | Discharge status: Home / Self Care | Source: Ambulatory Visit | Attending: Cardiology | Admitting: Cardiology

## 2024-09-21 DIAGNOSIS — R072 Precordial pain: Secondary | ICD-10-CM | POA: Insufficient documentation

## 2024-09-21 MED ORDER — IOHEXOL 350 MG/ML SOLN
100.0000 mL | Freq: Once | INTRAVENOUS | Status: AC | PRN
Start: 2024-09-21 — End: 2024-09-21
  Administered 2024-09-21: 100 mL via INTRAVENOUS

## 2024-09-21 MED ORDER — NITROGLYCERIN 0.4 MG SL SUBL
0.8000 mg | SUBLINGUAL_TABLET | Freq: Once | SUBLINGUAL | Status: AC
  Administered 2024-09-21: 0.8 mg via SUBLINGUAL

## 2024-09-21 MED ORDER — SODIUM CHLORIDE 0.9 % IV SOLN: Freq: Once | INTRAVENOUS | Status: AC

## 2024-09-21 NOTE — Assessment & Plan Note (Signed)
 The 10-year ASCVD risk score (Arnett DK, et al., 2019) is: 1.9%   Values used to calculate the score:     Age: 43 years     Clincally relevant sex: Female     Is Non-Hispanic African American: No     Diabetic: No     Tobacco smoker: No     Systolic Blood Pressure: 116 mmHg     Is BP treated: No     HDL Cholesterol: 37 mg/dL     Total Cholesterol: 264 mg/dL  89-bzjm risk as above.  Had lengthy discussion with patient that elevation of LDL to this extent is likely family hypercholesterolemia.  Discussed starting low-dose statin and reevaluating cholesterol in 3 months.  May also benefit from referral to cardiology's lipid clinic for further evaluation of genetic factors and medication management.

## 2024-10-04 ENCOUNTER — Other Ambulatory Visit: Payer: Self-pay | Admitting: *Deleted

## 2024-10-04 MED ORDER — NURTEC 75 MG PO TBDP: 75.0000 mg | ORAL_TABLET | Freq: Every day | ORAL | 1 refills | Status: AC | PRN

## 2024-10-04 NOTE — Telephone Encounter (Signed)
 Last seen on 08/08/24 Follow up scheduled on 11/08/24

## 2024-10-10 ENCOUNTER — Encounter: Payer: Self-pay | Admitting: Cardiology

## 2024-10-18 ENCOUNTER — Ambulatory Visit (INDEPENDENT_AMBULATORY_CARE_PROVIDER_SITE_OTHER): Payer: Self-pay | Admitting: Family Medicine

## 2024-10-24 ENCOUNTER — Other Ambulatory Visit (HOSPITAL_COMMUNITY): Payer: Self-pay

## 2024-10-25 ENCOUNTER — Other Ambulatory Visit (HOSPITAL_COMMUNITY): Payer: Self-pay

## 2024-10-25 ENCOUNTER — Other Ambulatory Visit: Payer: Self-pay

## 2024-10-25 ENCOUNTER — Ambulatory Visit: Attending: Cardiology

## 2024-10-25 DIAGNOSIS — R002 Palpitations: Secondary | ICD-10-CM

## 2024-10-25 NOTE — Progress Notes (Signed)
 Specialty Pharmacy Refill Coordination Note  Debbie [name] is a 43 y.o. female assessed today regarding refills of clinic administered specialty medication(s) OnabotulinumtoxinA  (Botox )   Clinic requested Courier to Provider Office   Delivery date: 11/03/24   Verified address: [facility] [address]   Medication will be filled on: 11/02/24

## 2024-10-26 ENCOUNTER — Encounter (INDEPENDENT_AMBULATORY_CARE_PROVIDER_SITE_OTHER): Payer: Self-pay | Admitting: Family Medicine

## 2024-10-26 ENCOUNTER — Ambulatory Visit (INDEPENDENT_AMBULATORY_CARE_PROVIDER_SITE_OTHER): Admitting: Family Medicine

## 2024-10-26 VITALS — BP 113/82 | HR 87 | Temp 97.6°F | Ht 64.0 in | Wt 218.0 lb

## 2024-10-26 DIAGNOSIS — E559 Vitamin D deficiency, unspecified: Secondary | ICD-10-CM

## 2024-10-26 DIAGNOSIS — R0602 Shortness of breath: Secondary | ICD-10-CM | POA: Insufficient documentation

## 2024-10-26 DIAGNOSIS — R7303 Prediabetes: Secondary | ICD-10-CM

## 2024-10-26 DIAGNOSIS — Z6837 Body mass index (BMI) 37.0-37.9, adult: Secondary | ICD-10-CM

## 2024-10-26 DIAGNOSIS — D751 Secondary polycythemia: Secondary | ICD-10-CM

## 2024-10-26 MED ORDER — SEMAGLUTIDE (1 MG/DOSE) 4 MG/3ML ~~LOC~~ SOPN: 1.0000 mg | PEN_INJECTOR | SUBCUTANEOUS | 0 refills | Status: DC

## 2024-10-26 NOTE — Progress Notes (Unsigned)
 SUBJECTIVE:  Chief Complaint: Obesity  Interim History: Patient here for routine follow up.  Debbie [name] had a quiet Thanksgiving and stayed local.  Debbie [name] ended up meeting some friends for dinner. For this month her sister is coming the last two week of December and Debbie [name] is anticipating getting some hiking in and then revamping some of her dietary intake.  Debbie [name] is hoping to make extra in terms of meals to have frozen meals available.  Debbie [name] has been working out more consistently and is feeling some right shoulder discomfort.  Has a set amount of food that Debbie [name] eats during the day.  Debbie [name] is trying to ensure Debbie [name] is getting as much protein as Debbie [name] can during the day.  Debbie [name] is here to discuss her progress with her obesity treatment plan. Debbie [name] is on the Category 3 Plan and states Debbie [name] is following her eating plan approximately 80 % of the time. Debbie [name] states Debbie [name] is not exercising currently, but was walking prior to injury.   OBJECTIVE: Visit Diagnoses: Problem List Items Addressed This Visit       Other   Other porphyria (HCC)   Relevant Medications   Semaglutide , 1 MG/DOSE, 4 MG/3ML SOPN   Obesity, Beginning BMI 42.7   Relevant Medications   Semaglutide , 1 MG/DOSE, 4 MG/3ML SOPN   Prediabetes   Relevant Medications   Semaglutide , 1 MG/DOSE, 4 MG/3ML SOPN   Other Relevant Orders   Hemoglobin A1c   Insulin , random   SOBOE (shortness of breath on exertion) - Primary   Patient RMR by indirect calorimetry on 08/13/23 of 2002.  Debbie [name] has been taking low dose semaglutide  and has been trying work on hitting nutritional goals in terms of calories and protein.  Resting metabolic rate today of 1642- symptoms were improving but now patient is limited again in terms of her activity allowance.       Other Visit Diagnoses       Vitamin D  deficiency       Relevant Orders   VITAMIN D  25 Hydroxy (Vit-D Deficiency, Fractures)     Erythrocytosis       Relevant Orders   CBC w/Diff/Platelet     BMI 37.0-37.9, adult            Vitals Temp: 97.6 F (36.4 C) BP: 113/82 Pulse Rate: 87 SpO2: 97 %   Anthropometric Measurements Height: 5' 4 (1.626 m) Weight: 218 lb (98.9 kg) BMI (Calculated): 37.4 Weight at Last Visit: 216 lb Weight Gained Since Last Visit: 2 Starting Weight: 249 lb Total Weight Loss (lbs): 31 lb (14.1 kg)   Body Composition  Body Fat %: 42.6 % Fat Mass (lbs): 93.2 lbs Muscle Mass (lbs): 119.2 lbs Total Body Water  (lbs): 81 lbs Visceral Fat Rating : 11   Other Clinical Data RMR: 1642 Fasting: yes Labs: yes Today's Visit #: 15 Starting Date: 08/13/23 Comments: Cat 3     ASSESSMENT AND PLAN:  Diet: Debbie [name] is currently in the action stage of change. As such, her goal is to continue with weight loss efforts and has agreed to the Category 2 Plan or 1300-1400 calories and 90 or more grams of protein daily.  Exercise:  For substantial health benefits, adults should do at least 150 minutes (2 hours and 30 minutes) a week of moderate-intensity, or 75 minutes (1 hour and 15 minutes) a week of vigorous-intensity aerobic physical activity, or an equivalent combination of moderate- and vigorous-intensity aerobic activity. Aerobic activity should be performed in episodes of at  least 10 minutes, and preferably, it should be spread throughout the week.  Behavior Modification:  We discussed the following Behavioral Modification Strategies today: increasing lean protein intake, decreasing simple carbohydrates, increasing vegetables, meal planning and cooking strategies, holiday eating strategies, and planning for success. We discussed various medication options to help Debbie [name] with her weight loss efforts and we both agreed to ***.  Return in about 6 weeks (around 12/07/2024).   Debbie [name] was informed of the importance of frequent follow up visits to maximize her success with intensive lifestyle modifications for her multiple health conditions.  Attestation Statements:   Reviewed by clinician  on day of visit: allergies, medications, problem list, medical history, surgical history, family history, social history, and previous encounter notes.     Debbie [name] Cho, MD

## 2024-10-26 NOTE — Assessment & Plan Note (Signed)
 Patient RMR by indirect calorimetry on 08/13/23 of 2002.  She has been taking low dose semaglutide  and has been trying work on hitting nutritional goals in terms of calories and protein.  Resting metabolic rate today of 1642- symptoms were improving but now patient is limited again in terms of her activity allowance.

## 2024-10-27 DIAGNOSIS — R002 Palpitations: Secondary | ICD-10-CM | POA: Diagnosis not present

## 2024-10-27 LAB — CBC WITH DIFFERENTIAL/PLATELET
Basophils Absolute: 0.1 x10E3/uL (ref 0.0–0.2)
Basos: 1 %
EOS (ABSOLUTE): 0.1 x10E3/uL (ref 0.0–0.4)
Eos: 2 %
Hematocrit: 46.5 % (ref 34.0–46.6)
Hemoglobin: 15.3 g/dL (ref 11.1–15.9)
Immature Grans (Abs): 0 x10E3/uL (ref 0.0–0.1)
Immature Granulocytes: 0 %
Lymphocytes Absolute: 3.1 x10E3/uL (ref 0.7–3.1)
Lymphs: 39 %
MCH: 28 pg (ref 26.6–33.0)
MCHC: 32.9 g/dL (ref 31.5–35.7)
MCV: 85 fL (ref 79–97)
Monocytes Absolute: 0.5 x10E3/uL (ref 0.1–0.9)
Monocytes: 6 %
Neutrophils Absolute: 4.2 x10E3/uL (ref 1.4–7.0)
Neutrophils: 52 %
Platelets: 332 x10E3/uL (ref 150–450)
RBC: 5.46 x10E6/uL — ABNORMAL HIGH (ref 3.77–5.28)
RDW: 12.4 % (ref 11.7–15.4)
WBC: 7.9 x10E3/uL (ref 3.4–10.8)

## 2024-10-27 LAB — VITAMIN D 25 HYDROXY (VIT D DEFICIENCY, FRACTURES): Vit D, 25-Hydroxy: 24.5 ng/mL — ABNORMAL LOW (ref 30.0–100.0)

## 2024-10-27 LAB — HEMOGLOBIN A1C
Est. average glucose Bld gHb Est-mCnc: 103 mg/dL
Hgb A1c MFr Bld: 5.2 % (ref 4.8–5.6)

## 2024-10-27 LAB — INSULIN, RANDOM: INSULIN: 38.6 u[IU]/mL — ABNORMAL HIGH (ref 2.6–24.9)

## 2024-11-02 ENCOUNTER — Other Ambulatory Visit: Payer: Self-pay
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
## 2024-11-08 ENCOUNTER — Ambulatory Visit: Admitting: Family Medicine

## 2024-11-08 VITALS — BP 132/86 | HR 100

## 2024-11-08 DIAGNOSIS — G43709 Chronic migraine without aura, not intractable, without status migrainosus: Secondary | ICD-10-CM

## 2024-11-08 MED ORDER — ONABOTULINUMTOXINA 200 UNITS IJ SOLR
155.0000 [IU] | Freq: Once | INTRAMUSCULAR | Status: AC
  Administered 2024-11-08: 16:00:00 155 [IU] via INTRAMUSCULAR

## 2024-11-08 NOTE — Progress Notes (Signed)
 Botox - 200 units x 1 vial Lot: [id number] Expiration: 2028/03 NDC: 0023-3921-02  Bacteriostatic 0.9% Sodium Chloride - 4 mL  Lot: [id number] Expiration: [id number] NDC: 9590-8033-97  Dx: G43.709  S/P  Witnessed by KERRI J CMA

## 2024-11-09 NOTE — Assessment & Plan Note (Signed)
 Patient has been taking semaglutide  and is due for lab reassessment of response to dietary interventions as well as pharmacotherapy.  Repeat A1c and insulin  level ordered today.

## 2024-11-09 NOTE — Assessment & Plan Note (Signed)
 Patient has been limited in her decreasing carbohydrate consumption due to her porphyria.  She is now taking semaglutide  with some improvement in symptoms.  Continue current treatment

## 2024-11-14 ENCOUNTER — Other Ambulatory Visit: Payer: Self-pay

## 2024-11-14 MED ORDER — PROPRANOLOL HCL 20 MG PO TABS: 20.0000 mg | ORAL_TABLET | Freq: Two times a day (BID) | ORAL | 3 refills | Status: AC

## 2024-11-14 NOTE — Progress Notes (Signed)
 Propranolol  20 twice daily sent to pt's preferred pharmacy. Called pt to set up return appointment (March 2-6th) with Dr. Sheena. No answer, left message for pt to return call.

## 2024-11-14 NOTE — Telephone Encounter (Signed)
 Propranolol  20 twice daily sent to pt's preferred pharmacy. Called pt to set up return appointment (March 2-6th) with Dr. Sheena. No answer, left message for pt to return call.

## 2024-12-02 ENCOUNTER — Ambulatory Visit

## 2024-12-07 ENCOUNTER — Ambulatory Visit (INDEPENDENT_AMBULATORY_CARE_PROVIDER_SITE_OTHER): Admitting: Family Medicine

## 2024-12-28 ENCOUNTER — Telehealth (INDEPENDENT_AMBULATORY_CARE_PROVIDER_SITE_OTHER): Admitting: Family Medicine

## 2024-12-28 VITALS — Ht 64.0 in

## 2024-12-28 DIAGNOSIS — E559 Vitamin D deficiency, unspecified: Secondary | ICD-10-CM

## 2024-12-28 DIAGNOSIS — E7849 Other hyperlipidemia: Secondary | ICD-10-CM

## 2024-12-28 DIAGNOSIS — R7303 Prediabetes: Secondary | ICD-10-CM

## 2024-12-28 MED ORDER — VITAMIN D (ERGOCALCIFEROL) 1.25 MG (50000 UNIT) PO CAPS: 50000.0000 [IU] | ORAL_CAPSULE | ORAL | 0 refills | Status: AC

## 2024-12-28 MED ORDER — SEMAGLUTIDE (1 MG/DOSE) 4 MG/3ML ~~LOC~~ SOPN: 1.0000 mg | PEN_INJECTOR | SUBCUTANEOUS | 0 refills | Status: AC

## 2024-12-28 MED ORDER — ATORVASTATIN CALCIUM 10 MG PO TABS: 10.0000 mg | ORAL_TABLET | Freq: Every day | ORAL | 0 refills | Status: AC

## 2024-12-28 NOTE — Assessment & Plan Note (Signed)
 She has been able to tolerate 1 dose so far of Ozempic  0.5mg .  She is working on ensuring total nutritional intake while on medication.  Her most recent A1c of 5.2 at the beginning of December.
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
## 2024-12-29 ENCOUNTER — Telehealth (INDEPENDENT_AMBULATORY_CARE_PROVIDER_SITE_OTHER): Payer: Self-pay

## 2024-12-29 NOTE — Telephone Encounter (Signed)
 Dear Debbie [name]: [facility]  received a request from your provider for coverage of Ozempic (semaglutide). The request was denied because: Your plan only covers this drug when it is used for certain health conditions. Covered use is for type 2 diabetes. Your plan does not cover this drug for your health condition that your doctor told us you have. We reviewed the information we had. Your request has been denied. Your doctor can send [place] any new or missing information for [place] to review. For this drug, you may have to meet other criteria. You can request the drug policy for more details. You can also request other plan documents for your review.

## 2025-01-13 ENCOUNTER — Ambulatory Visit: Admitting: Pharmacist

## 2025-02-06 ENCOUNTER — Ambulatory Visit: Admitting: Family Medicine
